# Patient Record
Sex: Male | Born: 1975
Health system: Southern US, Community
[De-identification: ages and names within clinical notes are randomized; demographics above are authoritative.]

## PROBLEM LIST (undated history)

## (undated) DIAGNOSIS — G629 Polyneuropathy, unspecified: Secondary | ICD-10-CM

## (undated) DIAGNOSIS — Z9989 Dependence on other enabling machines and devices: Secondary | ICD-10-CM

## (undated) DIAGNOSIS — M25511 Pain in right shoulder: Secondary | ICD-10-CM

## (undated) DIAGNOSIS — G8929 Other chronic pain: Secondary | ICD-10-CM

## (undated) DIAGNOSIS — M549 Dorsalgia, unspecified: Secondary | ICD-10-CM

## (undated) DIAGNOSIS — C20 Malignant neoplasm of rectum: Secondary | ICD-10-CM

## (undated) DIAGNOSIS — E785 Hyperlipidemia, unspecified: Secondary | ICD-10-CM

## (undated) DIAGNOSIS — G4733 Obstructive sleep apnea (adult) (pediatric): Secondary | ICD-10-CM

## (undated) HISTORY — PX: WISDOM TOOTH EXTRACTION: SHX21

## (undated) HISTORY — DX: Pain in right shoulder: M25.511

## (undated) HISTORY — DX: Hyperlipidemia, unspecified: E78.5

## (undated) HISTORY — DX: Other chronic pain: G89.29

## (undated) HISTORY — DX: Dorsalgia, unspecified: M54.9

---

## 2005-06-08 ENCOUNTER — Ambulatory Visit: Payer: Self-pay | Admitting: Family Medicine

## 2006-01-23 ENCOUNTER — Ambulatory Visit: Payer: Self-pay | Admitting: Family Medicine

## 2006-01-26 ENCOUNTER — Ambulatory Visit: Payer: Self-pay | Admitting: Family Medicine

## 2006-04-20 ENCOUNTER — Ambulatory Visit: Payer: Self-pay | Admitting: Internal Medicine

## 2006-04-25 ENCOUNTER — Ambulatory Visit (HOSPITAL_BASED_OUTPATIENT_CLINIC_OR_DEPARTMENT_OTHER): Admission: RE | Admit: 2006-04-25 | Discharge: 2006-04-25 | Payer: Self-pay | Admitting: Internal Medicine

## 2006-04-29 ENCOUNTER — Ambulatory Visit: Payer: Self-pay | Admitting: Internal Medicine

## 2006-05-17 ENCOUNTER — Ambulatory Visit: Payer: Self-pay | Admitting: Internal Medicine

## 2006-06-21 ENCOUNTER — Emergency Department (HOSPITAL_COMMUNITY): Admission: EM | Admit: 2006-06-21 | Discharge: 2006-06-21 | Payer: Self-pay | Admitting: Emergency Medicine

## 2006-06-23 ENCOUNTER — Emergency Department (HOSPITAL_COMMUNITY): Admission: EM | Admit: 2006-06-23 | Discharge: 2006-06-23 | Payer: Self-pay | Admitting: Emergency Medicine

## 2006-06-27 ENCOUNTER — Emergency Department (HOSPITAL_COMMUNITY): Admission: EM | Admit: 2006-06-27 | Discharge: 2006-06-27 | Payer: Self-pay | Admitting: Emergency Medicine

## 2007-03-18 DIAGNOSIS — J309 Allergic rhinitis, unspecified: Secondary | ICD-10-CM | POA: Insufficient documentation

## 2007-03-18 DIAGNOSIS — F3289 Other specified depressive episodes: Secondary | ICD-10-CM | POA: Insufficient documentation

## 2007-03-18 DIAGNOSIS — F329 Major depressive disorder, single episode, unspecified: Secondary | ICD-10-CM | POA: Insufficient documentation

## 2007-03-18 DIAGNOSIS — F411 Generalized anxiety disorder: Secondary | ICD-10-CM | POA: Insufficient documentation

## 2014-06-18 ENCOUNTER — Encounter: Payer: Self-pay | Admitting: *Deleted

## 2017-08-13 DIAGNOSIS — R0683 Snoring: Secondary | ICD-10-CM | POA: Diagnosis not present

## 2017-08-13 DIAGNOSIS — I10 Essential (primary) hypertension: Secondary | ICD-10-CM | POA: Diagnosis not present

## 2017-08-21 ENCOUNTER — Ambulatory Visit (INDEPENDENT_AMBULATORY_CARE_PROVIDER_SITE_OTHER): Payer: Commercial Managed Care - PPO | Admitting: Neurology

## 2017-08-21 ENCOUNTER — Encounter (INDEPENDENT_AMBULATORY_CARE_PROVIDER_SITE_OTHER): Payer: Self-pay

## 2017-08-21 ENCOUNTER — Encounter: Payer: Self-pay | Admitting: Neurology

## 2017-08-21 VITALS — BP 152/99 | HR 87 | Ht 72.0 in | Wt 181.0 lb

## 2017-08-21 DIAGNOSIS — R519 Headache, unspecified: Secondary | ICD-10-CM

## 2017-08-21 DIAGNOSIS — Z820 Family history of epilepsy and other diseases of the nervous system: Secondary | ICD-10-CM | POA: Diagnosis not present

## 2017-08-21 DIAGNOSIS — R030 Elevated blood-pressure reading, without diagnosis of hypertension: Secondary | ICD-10-CM | POA: Diagnosis not present

## 2017-08-21 DIAGNOSIS — G2581 Restless legs syndrome: Secondary | ICD-10-CM | POA: Diagnosis not present

## 2017-08-21 DIAGNOSIS — G4719 Other hypersomnia: Secondary | ICD-10-CM

## 2017-08-21 DIAGNOSIS — R0681 Apnea, not elsewhere classified: Secondary | ICD-10-CM | POA: Diagnosis not present

## 2017-08-21 DIAGNOSIS — R51 Headache: Secondary | ICD-10-CM | POA: Diagnosis not present

## 2017-08-21 DIAGNOSIS — R0683 Snoring: Secondary | ICD-10-CM | POA: Diagnosis not present

## 2017-08-21 DIAGNOSIS — G4761 Periodic limb movement disorder: Secondary | ICD-10-CM

## 2017-08-21 DIAGNOSIS — E663 Overweight: Secondary | ICD-10-CM | POA: Diagnosis not present

## 2017-08-21 NOTE — Patient Instructions (Signed)

## 2017-08-21 NOTE — Progress Notes (Signed)
Subjective:    Patient ID: Brandon Barry is a 42 y.o. male.  HPI     Star Age, MD, PhD [facility] [address], [address] [place], [place] [address]  Dear Dr. Vanetta Shawl,   I saw your patient, Brandon Barry, upon your kind request in my neurologic clinic today for initial consultation of his sleep disorder, in particular, concern for underlying obstructive sleep apnea. The patient is unaccompanied today. As you know, Mr. Rosete is a 42 year old right-handed gentleman with an underlying medical history of HTN, and borderline overweight state, who reports snoring and excessive daytime somnolence. I reviewed your office note from 08/13/2013, which you kindly included. His Epworth sleepiness score is 12 out of 24 today, fatigue score is 40 out of 63. He lives at home with his wife and 2 children. He quit smoking, drinks alcohol occasionally, maybe 2-3 times a week and coffee in the morning. He has had elevated blood pressure values for some time, he has been tracking his blood pressure at home and has had higher numbers he admits. He has not been on hypertension medication. He reports a family history of OSA in his father who uses a CPAP machine. Patient has been snoring for some time but it has become worse with time. His wife has noted apneic pauses in his breathing while he is asleep. He does not wake up rested. He has a history of restless leg syndrome and has had leg movements in the past and was treated symptomatically with Klonopin as he recalls. He tapered it off some time ago. He remembers having a sleep study some time ago, maybe 10 years ago at which time his sleep apnea was borderline as he recalls. He works as a Psychologist, clinical. He has 2 children ages 45 and 42. Bedtime is fairly structured around 10 or 10:30 and rise time is 5:30. He does not have night to night nocturia but has woken up with a headache on the rare occasion. He quit smoking over 10 years ago  and limits his caffeine to one cup of coffee per morning on average, does not typically drink sodas.  His Past Medical History Is Significant For: Past Medical History:  Diagnosis Date  . Chronic back pain   . Hyperlipidemia   . Right shoulder pain     His Past Surgical History Is Significant For: Past Surgical History:  Procedure Laterality Date  . WISDOM TOOTH EXTRACTION      His Family History Is Significant For: Family History  Problem Relation Age of Onset  . Heart attack Father   . Cancer Paternal Grandmother   . Cancer Paternal Grandfather     His Social History Is Significant For: Social History   Socioeconomic History  . Marital status: Single    Spouse name: None  . Number of children: None  . Years of education: None  . Highest education level: None  Social Needs  . Financial resource strain: None  . Food insecurity - worry: None  . Food insecurity - inability: None  . Transportation needs - medical: None  . Transportation needs - non-medical: None  Occupational History  . None  Tobacco Use  . Smoking status: Former Research scientist (life sciences)  . Smokeless tobacco: Never Used  Substance and Sexual Activity  . Alcohol use: Yes  . Drug use: No  . Sexual activity: None  Other Topics Concern  . None  Social History Narrative  . None  His Allergies Are:  No Known Allergies:   His Current Medications Are:  No outpatient encounter medications on file as of 08/21/2017.   No facility-administered encounter medications on file as of 08/21/2017.   :  Review of Systems:  Out of a complete 14 point review of systems, all are reviewed and negative with the exception of these symptoms as listed below: Review of Systems  Neurological:       Pt presents today to discuss his sleep. Pt has had a sleep study a long time ago but has never been treated for osa. Pt does endorse snoring.  Epworth Sleepiness Scale 0= would never doze 1= slight chance of dozing 2= moderate chance  of dozing 3= high chance of dozing  Sitting and reading: 1 Watching TV: 3 Sitting inactive in a public place (ex. Theater or meeting): 2 As a passenger in a car for an hour without a break: 1 Lying down to rest in the afternoon: 3 Sitting and talking to someone: 0 Sitting quietly after lunch (no alcohol): 2 In a car, while stopped in traffic: 0 Total: 12     Objective:  Neurological Exam  Physical Exam Physical Examination:   Vitals:   08/21/17 1341  BP: (!) 152/99  Pulse: 87    General Examination: The patient is a very pleasant 42 y.o. male in no acute distress. He appears well-developed and well-nourished and adequately groomed.   HEENT: Normocephalic, atraumatic, pupils are equal, round and reactive to light and accommodation. Extraocular tracking is good without limitation to gaze excursion or nystagmus noted. Normal smooth pursuit is noted. Hearing is grossly intact. Face is symmetric with normal facial animation and normal facial sensation. Speech is clear with no dysarthria noted. There is no hypophonia. There is no lip, neck/head, jaw or voice tremor. Neck is supple with full range of passive and active motion. There are no carotid bruits on auscultation. Oropharynx exam reveals: mild mouth dryness, adequate dental hygiene and moderate airway crowding, due to redundant soft palate and fairly low reaching uvula. Mallampati is class Barry. Tonsils are either absent or small. Patient reported no history of tonsillectomy. He has an absent overbite. Tongue protrudes centrally and palate elevates symmetrically.   Chest: Clear to auscultation without wheezing, rhonchi or crackles noted.  Heart: S1+S2+0, regular and normal without murmurs, rubs or gallops noted.   Abdomen: Soft, non-tender and non-distended with normal bowel sounds appreciated on auscultation.  Extremities: There is no pitting edema in the distal lower extremities bilaterally. Pedal pulses are intact.  Skin:  Warm and dry without trophic changes noted.   Musculoskeletal: exam reveals no obvious joint deformities, tenderness or joint swelling or erythema.   Neurologically:  Mental status: The patient is awake, alert and oriented in all 4 spheres. His immediate and remote memory, attention, language skills and fund of knowledge are appropriate. There is no evidence of aphasia, agnosia, apraxia or anomia. Speech is clear with normal prosody and enunciation. Thought process is linear. Mood is normal and affect is normal.  Cranial nerves II - XII are as described above under HEENT exam. In addition: shoulder shrug is normal with equal shoulder height noted. Motor exam: Normal bulk, strength and tone is noted. There is no drift, tremor or rebound. Romberg is negative. Fine motor skills and coordination: intact with normal finger taps, normal hand movements, normal rapid alternating patting, normal foot taps and normal foot agility.  Cerebellar testing: No dysmetria or intention tremor on finger to nose testing.  Heel to shin is unremarkable bilaterally. There is no truncal or gait ataxia.  Sensory exam: intact to light touch in the upper and lower extremities.  Gait, station and balance: He stands easily. No veering to one side is noted. No leaning to one side is noted. Posture is age-appropriate and stance is narrow based. Gait shows normal stride length and normal pace. No problems turning are noted. Tandem walk is unremarkable.   Assessment and Plan:  In summary, Nydia Bouton Barry is a very pleasant 42 y.o.-year old male with a history of elevated blood pressure values and prior history of restless leg syndrome, whose history and physical exam are concerning for underlying obstructive sleep apnea (OSA). I had a long chat with the patient about my findings and the diagnosis of OSA, its prognosis and treatment options. We talked about medical treatments, surgical interventions and non-pharmacological approaches. I  explained in particular the risks and ramifications of untreated moderate to severe OSA, especially with respect to developing cardiovascular disease down the Road, including congestive heart failure, difficult to treat hypertension, cardiac arrhythmias, or stroke. Even type 2 diabetes has, in part, been linked to untreated OSA. Symptoms of untreated OSA include daytime sleepiness, memory problems, mood irritability and mood disorder such as depression and anxiety, lack of energy, as well as recurrent headaches, especially morning headaches. We talked about trying to maintain a healthy lifestyle in general, as well as the importance of weight control. I encouraged the patient to eat healthy, exercise daily and keep well hydrated, to keep a scheduled bedtime and wake time routine, to not skip any meals and eat healthy snacks in between meals. I advised the patient not to drive when feeling sleepy. I recommended the following at this time: sleep study  with potential positive airway pressure titration. (We will score hypopneas at 4%).   I explained the sleep test procedure to the patient and also outlined possible surgical and non-surgical treatment options of OSA, including the use of a custom-made dental device (which would require a referral to a specialist dentist or oral surgeon), upper airway surgical options, such as pillar implants, radiofrequency surgery, tongue base surgery, and UPPP (which would involve a referral to an ENT surgeon). Rarely, jaw surgery such as mandibular advancement may be considered.  I also explained the CPAP treatment option to the patient, who indicated that he would be willing to try CPAP if the need arises. I explained the importance of being compliant with PAP treatment, not only for insurance purposes but primarily to improve His symptoms, and for the patient's long term health benefit, including to reduce His cardiovascular risks. I answered all his questions today and the  patient was in agreement. I would like to see him back after the sleep study is completed and encouraged him to call with any interim questions, concerns, problems or updates.   Thank you very much for allowing me to participate in the care of this nice patient. If I can be of any further assistance to you please do not hesitate to call me at [phone].  Sincerely,   Star Age, MD, PhD

## 2017-10-08 ENCOUNTER — Ambulatory Visit (INDEPENDENT_AMBULATORY_CARE_PROVIDER_SITE_OTHER): Payer: Commercial Managed Care - PPO | Admitting: Neurology

## 2017-10-08 DIAGNOSIS — R519 Headache, unspecified: Secondary | ICD-10-CM

## 2017-10-08 DIAGNOSIS — G4733 Obstructive sleep apnea (adult) (pediatric): Secondary | ICD-10-CM | POA: Diagnosis not present

## 2017-10-08 DIAGNOSIS — R51 Headache: Secondary | ICD-10-CM

## 2017-10-08 DIAGNOSIS — R030 Elevated blood-pressure reading, without diagnosis of hypertension: Secondary | ICD-10-CM

## 2017-10-08 DIAGNOSIS — Z820 Family history of epilepsy and other diseases of the nervous system: Secondary | ICD-10-CM

## 2017-10-08 DIAGNOSIS — R0683 Snoring: Secondary | ICD-10-CM

## 2017-10-08 DIAGNOSIS — G4720 Circadian rhythm sleep disorder, unspecified type: Secondary | ICD-10-CM

## 2017-10-08 DIAGNOSIS — G4719 Other hypersomnia: Secondary | ICD-10-CM

## 2017-10-08 DIAGNOSIS — G2581 Restless legs syndrome: Secondary | ICD-10-CM

## 2017-10-08 DIAGNOSIS — R0681 Apnea, not elsewhere classified: Secondary | ICD-10-CM

## 2017-10-08 DIAGNOSIS — E663 Overweight: Secondary | ICD-10-CM

## 2017-10-08 DIAGNOSIS — G4761 Periodic limb movement disorder: Secondary | ICD-10-CM

## 2017-10-10 ENCOUNTER — Telehealth: Payer: Self-pay

## 2017-10-10 NOTE — Progress Notes (Signed)
Patient referred by Dr. Vanetta Shawl, seen by me on 08/21/17, diagnostic PSG on 10/08/17.   Please call and notify the patient that the recent sleep study showed obstructive sleep apnea, overall mild but severe during supine sleep with a total AHI of 11.9/hour, REM AHI of 5.9/hour, supine AHI of 35.6/hour, and O2 nadir of 85%. . I recommend treatment for this in the form of CPAP. This will require a repeat sleep study for proper titration and mask fitting and correct monitoring of the oxygen saturations. Please explain to patient. I have placed an order in the chart. Thanks.  Star Age, MD, PhD [facility])

## 2017-10-10 NOTE — Telephone Encounter (Signed)
I called Brandon Barry. I advised Brandon Barry that Dr. Rexene Alberts reviewed their sleep study results and found that Brandon Barry has overall mild osa but it was severe during his supine sleep and recommends that Brandon Barry be treated with a cpap. Dr. Rexene Alberts recommends that Brandon Barry return for a repeat sleep study in order to properly titrate the cpap and ensure a good mask fit. Brandon Barry is agreeable to returning for a titration study. I advised Brandon Barry that our sleep lab will file with Brandon Barry's insurance and call Brandon Barry to schedule the sleep study when we hear back from the Brandon Barry's insurance regarding coverage of this sleep study. Brandon Barry verbalized understanding of results. Brandon Barry had no questions at this time but was encouraged to call back if questions arise.

## 2017-10-10 NOTE — Addendum Note (Signed)
Addended by: Star Age on: 10/10/2017 07:55 AM   Modules accepted: Orders

## 2017-10-10 NOTE — Procedures (Signed)
PATIENT'S NAME:  Brandon Barry, Mohr DOB:      02-12-1976      MR#:    [id number]     DATE OF RECORDING: 10/08/2017 REFERRING M.D.:  Katherine Basset, DO Study Performed:   Baseline Polysomnogram HISTORY: 42 year old man with a history of HTN, and borderline overweight state, who reports snoring and excessive daytime somnolence. His Epworth sleepiness score is 12 out of 24. The patient's weight 181 pounds with a height of 62 (inches), resulting in a BMI of 33.3 kg/m2. The patient's neck circumference measured 16.5 inches.  CURRENT MEDICATIONS: None   PROCEDURE:  This is a multichannel digital polysomnogram utilizing the Somnostar 11.2 system.  Electrodes and sensors were applied and monitored per AASM Specifications.   EEG, EOG, Chin and Limb EMG, were sampled at 200 Hz.  ECG, Snore and Nasal Pressure, Thermal Airflow, Respiratory Effort, CPAP Flow and Pressure, Oximetry was sampled at 50 Hz. Digital video and audio were recorded.      BASELINE STUDY  Lights Out was at 21:04 and Lights On at 05:01.  Total recording time (TRT) was 478 minutes, with a total sleep time (TST) of  322 minutes.   The patient's sleep latency was 108 minutes, which is markedly delayed.  REM latency was 62.5 minutes, which is mildly reduced. The sleep efficiency was 67.4%, which is reduced.     SLEEP ARCHITECTURE: WASO (Wake after sleep onset) was 51 minutes with overall mild sleep fragmentation noted and 2 longer periods of wakefulness. There were 16.5 minutes in Stage N1, 245.5 minutes Stage N2, 9.5 minutes Stage N3 and 50.5 minutes in Stage REM.  The percentage of Stage N1 was 5.1%, Stage N2 was 76.2%, which is increased, Stage N3 was 3% and Stage R (REM sleep) was 15.7%, which is mildly reduced. The arousals were noted as: 21 were spontaneous, 11 were associated with PLMs, 64 were associated with respiratory events.  RESPIRATORY ANALYSIS:  There were a total of 64 respiratory events:  5 obstructive apneas, 0 central apneas and 0  mixed apneas with a total of 5 apneas and an apnea index (AI) of .9 /hour. There were 59 hypopneas with a hypopnea index of 11. /hour. The patient also had 0 respiratory event related arousals (RERAs).      The total APNEA/HYPOPNEA INDEX (AHI) was 11.9/hour and the total RESPIRATORY DISTURBANCE INDEX was 0. 11.9 /hour.  5 events occurred in REM sleep and 108 events in NREM. The REM AHI was 5.9/hour, versus a non-REM AHI of 13.. The patient spent 89.5 minutes of total sleep time in the supine position and 233 minutes in non-supine. The supine AHI was 35.6 versus a non-supine AHI of 2.8.  OXYGEN SATURATION & C02:  The Wake baseline 02 saturation was 94%, with the lowest being 85%. Time spent below 89% saturation equaled 3 minutes.  PERIODIC LIMB MOVEMENTS: The patient had a total of 39 Periodic Limb Movements.  The Periodic Limb Movement (PLM) index was 7.3 and the PLM Arousal index was 2./hour.   Audio and video analysis did not show any abnormal or unusual movements, behaviors, phonations or vocalizations. The patient took 2 bathroom breaks. Moderate snoring was noted. The EKG was in keeping with normal sinus rhythm (NSR).   Post-study, the patient indicated that sleep was worse than usual.   IMPRESSION:  1. Obstructive Sleep Apnea (OSA) 2. Dysfunctions associated with Sleep stages or Arousal from Sleep   RECOMMENDATIONS:  1. This study demonstrates overall mild obstructive sleep apnea, severe  during supine sleep with a total AHI of 11.9/hour, REM AHI of 5.9/hour, supine AHI of 35.6/hour, and O2 nadir of 85%. Given the patient's medical history and sleep related complaints, a full-night CPAP titration study is recommended to optimize therapy. Other treatment options may include avoidance of supine sleep position along with weight loss, upper airway or jaw surgery in selected patients or the use of an oral appliance in certain patients. ENT evaluation and/or consultation with a maxillofacial  surgeon or dentist may be feasible in some instances.    2. Please note that untreated obstructive sleep apnea may carry additional perioperative morbidity. Patients with significant obstructive sleep apnea should receive perioperative PAP therapy and the surgeons and particularly the anesthesiologist should be informed of the diagnosis and the severity of the sleep disordered breathing. 3. The patient should be cautioned not to drive, work at heights, or operate dangerous or heavy equipment when tired or sleepy. Review and reiteration of good sleep hygiene measures should be pursued with any patient. 4. The patient will be seen in follow-up by Dr. Rexene Alberts at [facility] for discussion of the test results and further management strategies. The referring provider will be notified of the test results.  I certify that I have reviewed the entire raw data recording prior to the issuance of this report in accordance with the Standards of Accreditation of the American Academy of Sleep Medicine (AASM)   Star Age, MD, PhD Diplomat, American Board of Psychiatry and Neurology (Neurology and Sleep Medicine)

## 2017-10-10 NOTE — Telephone Encounter (Signed)
-----   Message from Star Age, MD sent at 10/10/2017  7:55 AM EDT ----- Patient referred by Dr. Vanetta Shawl, seen by me on 08/21/17, diagnostic PSG on 10/08/17.   Please call and notify the patient that the recent sleep study showed obstructive sleep apnea, overall mild but severe during supine sleep with a total AHI of 11.9/hour, REM AHI of 5.9/hour, supine AHI of 35.6/hour, and O2 nadir of 85%. . I recommend treatment for this in the form of CPAP. This will require a repeat sleep study for proper titration and mask fitting and correct monitoring of the oxygen saturations. Please explain to patient. I have placed an order in the chart. Thanks.  Star Age, MD, PhD [facility])

## 2017-10-18 ENCOUNTER — Ambulatory Visit (INDEPENDENT_AMBULATORY_CARE_PROVIDER_SITE_OTHER): Payer: Commercial Managed Care - PPO | Admitting: Neurology

## 2017-10-18 DIAGNOSIS — G4733 Obstructive sleep apnea (adult) (pediatric): Secondary | ICD-10-CM

## 2017-10-18 DIAGNOSIS — R51 Headache: Secondary | ICD-10-CM

## 2017-10-18 DIAGNOSIS — G4761 Periodic limb movement disorder: Secondary | ICD-10-CM

## 2017-10-18 DIAGNOSIS — R519 Headache, unspecified: Secondary | ICD-10-CM

## 2017-10-18 DIAGNOSIS — R030 Elevated blood-pressure reading, without diagnosis of hypertension: Secondary | ICD-10-CM

## 2017-10-18 DIAGNOSIS — G4719 Other hypersomnia: Secondary | ICD-10-CM

## 2017-10-18 DIAGNOSIS — G2581 Restless legs syndrome: Secondary | ICD-10-CM

## 2017-10-18 DIAGNOSIS — G4720 Circadian rhythm sleep disorder, unspecified type: Secondary | ICD-10-CM

## 2017-10-22 ENCOUNTER — Telehealth: Payer: Self-pay

## 2017-10-22 NOTE — Addendum Note (Signed)
Addended by: Star Age on: 10/22/2017 07:44 AM   Modules accepted: Orders

## 2017-10-22 NOTE — Procedures (Signed)
PATIENT'S NAME:  Brandon Barry, Brandon Barry DOB:      [birth year]      MR#:    [id number]     DATE OF RECORDING: 10/18/2017 REFERRING M.D.:  Katherine Basset, DO Study Performed:   CPAP  Titration HISTORY:  42 year old man with a history of HTN, and borderline overweight state, who presents for a CPAP titration study. His diagnostic PSG on 10/08/17 showed a total AHI of 11.9/hour, REM AHI of 5.9/hour, supine AHI of 35.6/hour, and O2 nadir of 85%. The patient endorsed the Epworth Sleepiness Scale at 12 points. The patient's weight 181 pounds with a height of 72 (inches), resulting in a BMI of 24.5 kg/m2.  CURRENT MEDICATIONS: None   PROCEDURE:  This is a multichannel digital polysomnogram utilizing the SomnoStar 11.2 system.  Electrodes and sensors were applied and monitored per AASM Specifications.   EEG, EOG, Chin and Limb EMG, were sampled at 200 Hz.  ECG, Snore and Nasal Pressure, Thermal Airflow, Respiratory Effort, CPAP Flow and Pressure, Oximetry was sampled at 50 Hz. Digital video and audio were recorded.      The patient was fitted with medium P10 nasal pillows. CPAP was initiated at 5 cmH20 with heated humidity per AASM standards and pressure was advanced to 9 cmH20 because of hypopneas, apneas and desaturations.  At a PAP pressure of 8 cmH20, there was a reduction of the AHI to 0.5/hour with supine REM sleep achieved and O2 nadir of 93%.   Lights Out was at 22:32 and Lights On at 05:01. Total recording time (TRT) was 389 minutes, with a total sleep time (TST) of 339 minutes. The patient's sleep latency was 24 minutes. REM latency was 60.5 minutes, which is mildly reduced. The sleep efficiency was 87.1 %.    SLEEP ARCHITECTURE: WASO (Wake after sleep onset) was 25.5 minutes.  There were 11 minutes in Stage N1, 214.5 minutes Stage N2, 40 minutes Stage N3 and 73.5 minutes in Stage REM.  The percentage of Stage N1 was 3.2%, Stage N2 was 63.3%, which is mildly increased, Stage N3 was 11.8% and Stage R (REM sleep)  was 21.7%, which is normal. The arousals were noted as: 30 were spontaneous, 4 were associated with PLMs, 1 were associated with respiratory events.  RESPIRATORY ANALYSIS:  There was a total of 4 respiratory events: 1 obstructive apneas, 0 central apneas and 0 mixed apneas with a total of 1 apneas and an apnea index (AI) of .2 /hour. There were 3 hypopneas with a hypopnea index of .5/hour. The patient also had 0 respiratory event related arousals (RERAs).      The total APNEA/HYPOPNEA INDEX  (AHI) was .7/hour and the total RESPIRATORY DISTURBANCE INDEX was .7 0./hour  0 events occurred in REM sleep and 4 events in NREM. The REM AHI was 0/hour versus a non-REM AHI of .9 0./hour.  The patient spent 339 minutes of total sleep time in the supine position and 0 minutes in non-supine. The supine AHI was 0.7, versus a non-supine AHI of 0.0.  OXYGEN SATURATION & C02:  The baseline 02 saturation was 94%, with the lowest being 89%. Time spent below 89% saturation equaled 0 minutes.  PERIODIC LIMB MOVEMENTS:  The patient had a total of 105 Periodic Limb Movements. The Periodic Limb Movement (PLM) index was 18.6 and the PLM Arousal index was .7/hour.  Audio and video analysis did not show any abnormal or unusual movements, behaviors, phonations or vocalizations. The patient took no bathroom breaks. The EKG was in  keeping with normal sinus rhythm (NSR).  Post-study, the patient indicated that sleep was better than usual.   IMPRESSION:   1. Obstructive Sleep Apnea (OSA) 2. Periodic limb movements of sleep  RECOMMENDATIONS:   1. This study demonstrates resolution of the patient's obstructive sleep apnea with CPAP therapy. I will, therefore, start the patient on home CPAP treatment at a pressure of 8 cm via medium nasal pillows with heated humidity. The patient should be reminded to be fully compliant with PAP therapy to improve sleep related symptoms and decrease long term cardiovascular risks. The patient  should be reminded, that it may take up to 3 months to get fully used to using PAP with all planned sleep. The earlier full compliance is achieved, the better long term compliance tends to be. Please note that untreated obstructive sleep apnea may carry additional perioperative morbidity. Patients with significant obstructive sleep apnea should receive perioperative PAP therapy and the surgeons and particularly the anesthesiologist should be informed of the diagnosis and the severity of the sleep disordered breathing. 2. Mild PLMs (periodic limb movements of sleep) were noted during this study with no significant arousals; clinical correlation is recommended.  3. The patient should be cautioned not to drive, work at heights, or operate dangerous or heavy equipment when tired or sleepy. Review and reiteration of good sleep hygiene measures should be pursued with any patient. 4. The patient will be seen in follow-up by Dr. Rexene Alberts at [facility] for discussion of the test results and further management strategies. The referring provider will be notified of the test results.   I certify that I have reviewed the entire raw data recording prior to the issuance of this report in accordance with the Standards of Accreditation of the American Academy of Sleep Medicine (AASM)     Star Age, MD, PhD Diplomat, American Board of Psychiatry and Neurology (Neurology and Sleep Medicine)

## 2017-10-22 NOTE — Progress Notes (Signed)
Patient referred by Dr. Vanetta Shawl, seen by me on 08/21/17, diagnostic PSG on 10/08/17. Patient had a CPAP titration study on 10/18/17.  Please call and inform patient that I have entered an order for treatment with positive airway pressure (PAP) treatment for obstructive sleep apnea (OSA). He did well during the latest sleep study with CPAP. We will, therefore, arrange for a machine for home use through a DME (durable medical equipment) company of His choice; and I will see the patient back in follow-up in about 10 weeks. Please also explain to the patient that I will be looking out for compliance data, which can be downloaded from the machine (stored on an SD card, that is inserted in the machine) or via remote access through a modem, that is built into the machine. At the time of the followup appointment we will discuss sleep study results and how it is going with PAP treatment at home. Please advise patient to bring His machine at the time of the first FU visit, even though this is cumbersome. Bringing the machine for every visit after that will likely not be needed, but often helps for the first visit to troubleshoot if needed. Please re-enforce the importance of compliance with treatment and the need for [place] to monitor compliance data - often an insurance requirement and actually good feedback for the patient as far as how they are doing.  Also remind patient, that any interim PAP machine or mask issues should be first addressed with the DME company, as they can often help better with technical and mask fit issues. Please ask if patient has a preference regarding DME company.  Please also make sure, the patient has a follow-up appointment with me in about 10 weeks from the setup date, thanks. May see one of our nurse practitioners if needed for proper timing of the FU appointment.  Please fax or rout report to the referring provider. Thanks,   Star Age, MD, PhD [facility])

## 2017-10-22 NOTE — Telephone Encounter (Signed)
I called pt. I advised pt that Dr. Rexene Alberts reviewed their sleep study results and found that pt did well with the cpap during his latest sleep study. Dr. Rexene Alberts recommends that pt start a cpap at home. Pt reports that he has a cpap available to him that is around 43 year old. I reviewed PAP compliance expectations with the pt. Pt is agreeable to starting a CPAP. I advised pt that an order will be sent to a DME, [facility], and [facility] will call the pt within about one week after they file with the pt's insurance. [facility] will show the pt how to use the machine, fit for masks, and troubleshoot the CPAP if needed. A follow up appt was made for insurance purposes with Dr. Rexene Alberts on 12/27/17 at 2:00pm. Pt verbalized understanding to arrive 15 minutes early and bring their CPAP. A letter with all of this information in it will be mailed to the pt as a reminder. I verified with the pt that the address we have on file is correct. Pt verbalized understanding of results. Pt had no questions at this time but was encouraged to call back if questions arise.

## 2017-10-22 NOTE — Telephone Encounter (Signed)
-----   Message from Star Age, MD sent at 10/22/2017  7:44 AM EDT ----- Patient referred by Dr. Vanetta Shawl, seen by me on 08/21/17, diagnostic PSG on 10/08/17. Patient had a CPAP titration study on 10/18/17.  Please call and inform patient that I have entered an order for treatment with positive airway pressure (PAP) treatment for obstructive sleep apnea (OSA). He did well during the latest sleep study with CPAP. We will, therefore, arrange for a machine for home use through a DME (durable medical equipment) company of His choice; and I will see the patient back in follow-up in about 10 weeks. Please also explain to the patient that I will be looking out for compliance data, which can be downloaded from the machine (stored on an SD card, that is inserted in the machine) or via remote access through a modem, that is built into the machine. At the time of the followup appointment we will discuss sleep study results and how it is going with PAP treatment at home. Please advise patient to bring His machine at the time of the first FU visit, even though this is cumbersome. Bringing the machine for every visit after that will likely not be needed, but often helps for the first visit to troubleshoot if needed. Please re-enforce the importance of compliance with treatment and the need for [place] to monitor compliance data - often an insurance requirement and actually good feedback for the patient as far as how they are doing.  Also remind patient, that any interim PAP machine or mask issues should be first addressed with the DME company, as they can often help better with technical and mask fit issues. Please ask if patient has a preference regarding DME company.  Please also make sure, the patient has a follow-up appointment with me in about 10 weeks from the setup date, thanks. May see one of our nurse practitioners if needed for proper timing of the FU appointment.  Please fax or rout report to the referring provider. Thanks,    Star Age, MD, PhD [facility])

## 2017-11-09 DIAGNOSIS — G4733 Obstructive sleep apnea (adult) (pediatric): Secondary | ICD-10-CM | POA: Diagnosis not present

## 2017-11-13 DIAGNOSIS — I10 Essential (primary) hypertension: Secondary | ICD-10-CM | POA: Diagnosis not present

## 2017-12-09 DIAGNOSIS — G4733 Obstructive sleep apnea (adult) (pediatric): Secondary | ICD-10-CM | POA: Diagnosis not present

## 2017-12-27 ENCOUNTER — Ambulatory Visit: Payer: Self-pay | Admitting: Neurology

## 2018-01-09 DIAGNOSIS — G4733 Obstructive sleep apnea (adult) (pediatric): Secondary | ICD-10-CM | POA: Diagnosis not present

## 2018-01-29 ENCOUNTER — Encounter: Payer: Self-pay | Admitting: Neurology

## 2018-01-31 ENCOUNTER — Encounter: Payer: Self-pay | Admitting: Neurology

## 2018-01-31 ENCOUNTER — Ambulatory Visit (INDEPENDENT_AMBULATORY_CARE_PROVIDER_SITE_OTHER): Payer: Commercial Managed Care - PPO | Admitting: Neurology

## 2018-01-31 VITALS — BP 142/94 | HR 73 | Ht 72.0 in | Wt 184.0 lb

## 2018-01-31 DIAGNOSIS — Z9989 Dependence on other enabling machines and devices: Secondary | ICD-10-CM

## 2018-01-31 DIAGNOSIS — G4733 Obstructive sleep apnea (adult) (pediatric): Secondary | ICD-10-CM | POA: Diagnosis not present

## 2018-01-31 NOTE — Patient Instructions (Signed)

## 2018-01-31 NOTE — Progress Notes (Signed)
Subjective:    Patient ID: Brandon Barry is a 42 y.o. male.  HPI     Interim history:   [name] is a 42 year old right-handed gentleman with an underlying medical history of HTN, and borderline overweight state, who presents for follow-up consultation of his obstructive sleep apnea, after sleep study testing and starting CPAP therapy. The patient is unaccompanied today. I first met him on 08/21/2017 at the request of his primary care physician, at which time he reported snoring and daytime somnolence and a family history of OSA. His wife had noted pauses in his breathing while he was asleep. He had a baseline sleep study, followed by a CPAP titration study. I went over his test results with him in detail today. Baseline sleep study from 10/08/2017 showed a sleep latency delayed at 108 minutes, REM latency was 62.5 minutes, sleep efficiency reduced. He had an increased percentage of light stage sleep, slow-wave sleep was reduced and REM sleep was also reduced. His total AHI was 11.9 per hour, REM AHI was 5.9 per hour, supine AHI was 35.6 per hour. Average oxygen saturation was 94%, nadir was 85%. He did not have any significant PLMS. Based on his sleep related complaints and test results he was advised to proceed with a CPAP titration study. He had this on 10/18/2017. Sleep latency was 24 minutes, REM latency was 60.5 minutes, sleep efficiency 87.1%. He had 11.8% of slow-wave sleep and a normal percentage of REM sleep. He was fitted with nasal pillows and CPAP was titrated from 5 cm to 9 cm. At a pressure of 8 cm his AHI was 0.5 per hour with supine REM sleep achieved an O2 nadir of 93%. He was noted to have mild PLMS during the study with no significant arousals. Based on his test results I suggested he proceed with CPAP therapy at home at a pressure of 8 cm. Today, 01/31/2018: I reviewed his CPAP compliance data from 12/31/2017 through 01/29/2018 which is a total of 30 days during which time he used  his CPAP every night with percent used days greater than 4 hours at 100%, indicating superb compliance with an average usage of 7 hours and 0 minutes, residual AHI at goal at 0.8 per hour, leak quite low with the 95th percentile at 1.8 L/m on a pressure of 8 cm with EPR of 3. He reports that CPAP is going well. He reports improvement in his sleep quality, snoring, sleep consolidation and daytime tiredness. He is still at times restless and per wife's feedback he has had some resurgence of leg twitching at night but has had no flareup in his restless leg symptoms. He is motivated to continue with CPAP therapy and so far quite pleased with his outcome. He is using nasal pillows.  The patient's allergies, current medications, family history, past medical history, past social history, past surgical history and problem list were reviewed and updated as appropriate.   Previously:   08/21/2017: (He) reports snoring and excessive daytime somnolence. I reviewed your office note from 08/13/2013, which you kindly included. His Epworth sleepiness score is 12 out of 24 today, fatigue score is 40 out of 63. He lives at home with his wife and 2 children. He quit smoking, drinks alcohol occasionally, maybe 2-3 times a week and coffee in the morning. He has had elevated blood pressure values for some time, he has been tracking his blood pressure at home and has had higher numbers he admits. He has not been  on hypertension medication. He reports a family history of OSA in his father who uses a CPAP machine. Patient has been snoring for some time but it has become worse with time. His wife has noted apneic pauses in his breathing while he is asleep. He does not wake up rested. He has a history of restless leg syndrome and has had leg movements in the past and was treated symptomatically with Klonopin as he recalls. He tapered it off some time ago. He remembers having a sleep study some time ago, maybe 10 years ago at which time  his sleep apnea was borderline as he recalls. He works as a mechanic on forklift. He has 2 children ages 9 and 8. Bedtime is fairly structured around 10 or 10:30 and rise time is 5:30. He does not have night to night nocturia but has woken up with a headache on the rare occasion. He quit smoking over 10 years ago and limits his caffeine to one cup of coffee per morning on average, does not typically drink sodas.  His Past Medical History Is Significant For: Past Medical History:  Diagnosis Date  . Chronic back pain   . Hyperlipidemia   . Right shoulder pain     His Past Surgical History Is Significant For: Past Surgical History:  Procedure Laterality Date  . WISDOM TOOTH EXTRACTION      His Family History Is Significant For: Family History  Problem Relation Age of Onset  . Heart attack Father   . Cancer Paternal Grandmother   . Cancer Paternal Grandfather     His Social History Is Significant For: Social History   Socioeconomic History  . Marital status: Single    Spouse name: Not on file  . Number of children: Not on file  . Years of education: Not on file  . Highest education level: Not on file  Occupational History  . Not on file  Social Needs  . Financial resource strain: Not on file  . Food insecurity:    Worry: Not on file    Inability: Not on file  . Transportation needs:    Medical: Not on file    Non-medical: Not on file  Tobacco Use  . Smoking status: Former Smoker  . Smokeless tobacco: Never Used  Substance and Sexual Activity  . Alcohol use: Yes  . Drug use: No  . Sexual activity: Not on file  Lifestyle  . Physical activity:    Days per week: Not on file    Minutes per session: Not on file  . Stress: Not on file  Relationships  . Social connections:    Talks on phone: Not on file    Gets together: Not on file    Attends religious service: Not on file    Active member of club or organization: Not on file    Attends meetings of clubs or  organizations: Not on file    Relationship status: Not on file  Other Topics Concern  . Not on file  Social History Narrative  . Not on file    His Allergies Are:  No Known Allergies:   His Current Medications Are:  No outpatient encounter medications on file as of 01/31/2018.   No facility-administered encounter medications on file as of 01/31/2018.   :  Review of Systems:  Out of a complete 14 point review of systems, all are reviewed and negative with the exception of these symptoms as listed below: Review of Systems  Neurological:         Pt presents today to discuss his cpap. Pt reports that he has no complaints with it and feels better.    Objective:  Neurological Exam  Physical Exam Physical Examination:   Vitals:   01/31/18 1428  BP: (!) 142/94  Pulse: 73   General Examination: The patient is a very pleasant 42 y.o. male in no acute distress. He appears well-developed and well-nourished and well groomed.   HEENT: Normocephalic, atraumatic, pupils are equal, round and reactive to light and accommodation. Extraocular tracking is good without limitation to gaze excursion or nystagmus noted. Normal smooth pursuit is noted. Hearing is grossly intact. Face is symmetric with normal facial animation and normal facial sensation. Speech is clear with no dysarthria noted. There is no hypophonia. There is no lip, neck/head, jaw or voice tremor. Neck is supple with full range of passive and active motion. Oropharynx exam reveals: mild mouth dryness, adequate dental hygiene and moderate airway crowding. Tongue protrudes centrally and palate elevates symmetrically.   Chest: Clear to auscultation without wheezing, rhonchi or crackles noted.  Heart: S1+S2+0, regular and normal without murmurs, rubs or gallops noted.   Abdomen: Soft, non-tender and non-distended with normal bowel sounds appreciated on auscultation.  Extremities: There is no pitting edema in the distal lower  extremities bilaterally.   Skin: Warm and dry without trophic changes noted.   Musculoskeletal: exam reveals no obvious joint deformities, tenderness or joint swelling or erythema.   Neurologically:  Mental status: The patient is awake, alert and oriented in all 4 spheres. His immediate and remote memory, attention, language skills and fund of knowledge are appropriate. There is no evidence of aphasia, agnosia, apraxia or anomia. Speech is clear with normal prosody and enunciation. Thought process is linear. Mood is normal and affect is normal.  Cranial nerves II - XII are as described above under HEENT exam.  Motor exam: Normal bulk, strength and tone is noted. There is no drift, tremor or rebound. Romberg is negative. Fine motor skills and coordination: intact with normal finger taps, normal hand movements, normal rapid alternating patting, normal foot taps and normal foot agility.  Cerebellar testing: No dysmetria or intention tremor. There is no truncal or gait ataxia.  Sensory exam: intact to light touch in the upper and lower extremities.  Gait, station and balance: He stands easily. No veering to one side is noted. No leaning to one side is noted. Posture is age-appropriate and stance is narrow based. Gait shows normal stride length and normal pace. No problems turning are noted. Tandem walk is unremarkable.   Assessment and Plan:  In summary, Bravery C Lowe Barry is a very pleasant 42-year old male with a history of elevated blood pressure values and prior history of restless leg syndrome, who Presents for follow-up consultation of his obstructive sleep apnea, after sleep study testing and starting CPAP therapy at home. His baseline sleep study from April 2019 showed evidence of overall mild obstructive sleep apnea, much more pronounced during supine sleep. He had a CPAP titration study in May 2019 and did quite well with CPAP of 8 cm. He did have mild PLMS during this study without  significant sleep disruption. He does report some recurrence of sleep related leg movements but no flareup of his restless leg symptoms. He is fully compliant with CPAP therapy and highly commended for this. He feels improved in that his sleep consolidation and sleep quality her better and he is not as tired during the day. In the past for restless   leg syndrome he was treated with Klonopin, as he recalls.  We can continue to monitor him for restless leg symptoms. I suggested we follow up routinely in 6 months, sooner if needed. I answered all his questions today and he was in agreement.  I spent 25 minutes in total face-to-face time with the patient, more than 50% of which was spent in counseling and coordination of care, reviewing test results, reviewing medication and discussing or reviewing the diagnosis of OSA, its prognosis and treatment options. Pertinent laboratory and imaging test results that were available during this visit with the patient were reviewed by me and considered in my medical decision making (see chart for details).   

## 2018-02-09 DIAGNOSIS — G4733 Obstructive sleep apnea (adult) (pediatric): Secondary | ICD-10-CM | POA: Diagnosis not present

## 2018-02-25 DIAGNOSIS — Z0001 Encounter for general adult medical examination with abnormal findings: Secondary | ICD-10-CM | POA: Diagnosis not present

## 2018-02-25 DIAGNOSIS — I10 Essential (primary) hypertension: Secondary | ICD-10-CM | POA: Diagnosis not present

## 2018-02-25 DIAGNOSIS — Z136 Encounter for screening for cardiovascular disorders: Secondary | ICD-10-CM | POA: Diagnosis not present

## 2018-02-28 DIAGNOSIS — Z136 Encounter for screening for cardiovascular disorders: Secondary | ICD-10-CM | POA: Diagnosis not present

## 2018-02-28 DIAGNOSIS — I10 Essential (primary) hypertension: Secondary | ICD-10-CM | POA: Diagnosis not present

## 2018-02-28 DIAGNOSIS — Z125 Encounter for screening for malignant neoplasm of prostate: Secondary | ICD-10-CM | POA: Diagnosis not present

## 2018-05-21 DIAGNOSIS — M25562 Pain in left knee: Secondary | ICD-10-CM | POA: Diagnosis not present

## 2018-05-21 DIAGNOSIS — I10 Essential (primary) hypertension: Secondary | ICD-10-CM | POA: Diagnosis not present

## 2018-05-21 DIAGNOSIS — Z6824 Body mass index (BMI) 24.0-24.9, adult: Secondary | ICD-10-CM | POA: Diagnosis not present

## 2018-06-18 DIAGNOSIS — L218 Other seborrheic dermatitis: Secondary | ICD-10-CM | POA: Diagnosis not present

## 2018-06-18 DIAGNOSIS — L821 Other seborrheic keratosis: Secondary | ICD-10-CM | POA: Diagnosis not present

## 2018-06-24 DIAGNOSIS — M25562 Pain in left knee: Secondary | ICD-10-CM | POA: Diagnosis not present

## 2018-06-29 DIAGNOSIS — M25562 Pain in left knee: Secondary | ICD-10-CM | POA: Diagnosis not present

## 2018-07-11 DIAGNOSIS — J00 Acute nasopharyngitis [common cold]: Secondary | ICD-10-CM | POA: Diagnosis not present

## 2018-07-22 DIAGNOSIS — S83242A Other tear of medial meniscus, current injury, left knee, initial encounter: Secondary | ICD-10-CM | POA: Diagnosis not present

## 2018-07-22 DIAGNOSIS — M1712 Unilateral primary osteoarthritis, left knee: Secondary | ICD-10-CM | POA: Diagnosis not present

## 2018-07-22 DIAGNOSIS — M25562 Pain in left knee: Secondary | ICD-10-CM | POA: Insufficient documentation

## 2018-08-04 ENCOUNTER — Encounter: Payer: Self-pay | Admitting: Adult Health

## 2018-08-06 ENCOUNTER — Other Ambulatory Visit: Payer: Self-pay

## 2018-08-06 ENCOUNTER — Encounter: Payer: Self-pay | Admitting: Neurology

## 2018-08-06 ENCOUNTER — Ambulatory Visit (INDEPENDENT_AMBULATORY_CARE_PROVIDER_SITE_OTHER): Payer: Commercial Managed Care - PPO | Admitting: Neurology

## 2018-08-06 VITALS — BP 143/92 | HR 70 | Ht 72.0 in | Wt 181.0 lb

## 2018-08-06 DIAGNOSIS — G4733 Obstructive sleep apnea (adult) (pediatric): Secondary | ICD-10-CM

## 2018-08-06 DIAGNOSIS — Z9989 Dependence on other enabling machines and devices: Secondary | ICD-10-CM

## 2018-08-06 NOTE — Patient Instructions (Addendum)
You have continued to do well on CPAP, please be sure to change your filter every 1-2 months, your headgear about every 3 months, pillow insert about monthly, hose about 6 months, humidifier chamber about yearly. Some restrictions are imposed by her insurance carrier with regard to how frequently you can get certain supplies.   Please continue using your CPAP regularly. While your insurance requires that you use CPAP at least 4 hours each night on 70% of the nights, I recommend, that you not skip any nights and use it throughout the night if you can. Getting used to CPAP and staying with the treatment long term does take time and patience and discipline. Untreated obstructive sleep apnea when it is moderate to severe can have an adverse impact on cardiovascular health and raise her risk for heart disease, arrhythmias, hypertension, congestive heart failure, stroke and diabetes. Untreated obstructive sleep apnea causes sleep disruption, nonrestorative sleep, and sleep deprivation. This can have an impact on your day to day functioning and cause daytime sleepiness and impairment of cognitive function, memory loss, mood disturbance, and problems focussing. Using CPAP regularly can improve these symptoms.  Keep up the good work! We can see you in 1 year, you can see one of our nurse practitioners as you are stable. I will see you after that.

## 2018-08-06 NOTE — Progress Notes (Signed)
Subjective:    Patient ID: Brandon Barry is Brandon 43 y.o. male.  HPI     Interim history:   Brandon Barry is Brandon Barry with an underlying medical history of HTN, and borderline overweight state, who presents for follow-up consultation of Brandon Barry obstructive sleep apnea, on CPAP therapy. The patient is unaccompanied today. I last saw him on 01/31/2018, at which time we talked about Brandon Barry sleep study results. Brandon Barry was fully compliant with CPAP and reported improvement in Brandon Barry sleep quality, sleep consolidation and daytime tiredness. Brandon Barry had some flareup of restless sleep but no significant RLS symptoms.  Today, 08/06/2018: I reviewed Brandon Barry CPAP compliance data from 07/06/2018 through 08/04/2018 which is Brandon total of 30 days, during which time Brandon Barry used Brandon Barry CPAP every night with percent used days greater than 4 hours at 100%, indicating superb compliance with an average usage of 7 hours and 16 minutes, residual AHI at goal at 0.8 per hour, leak on the low side with the 95th percentile at 10 L/m on Brandon pressure of 8 cm with EPR of 3. Brandon Barry reports CPAP is going well. Brandon Barry continues to benefit from it. Brandon Barry has not had any significant restless legs flareup. Brandon Barry has the occasional symptom, maybe some leg movements at night but nothing bothersome for now. Brandon Barry does change the filter on Brandon regular basis on Brandon Barry CPAP machine but has not had any new nasal pillows.  The patient's allergies, current medications, family history, past medical history, past social history, past surgical history and problem list were reviewed and updated as appropriate.    Previously:      I first met him on 08/21/2017 at the request of Brandon Barry primary care physician, at which time Brandon Barry reported snoring and daytime somnolence and Brandon family history of OSA. Brandon Barry wife had noted pauses in Brandon Barry breathing while Brandon Barry was asleep. Brandon Barry had Brandon baseline sleep study, followed by Brandon CPAP titration study. I went over Brandon Barry test results with him in detail today.  Baseline sleep study from 10/08/2017 showed Brandon sleep latency delayed at 108 minutes, REM latency was 62.5 minutes, sleep efficiency reduced. Brandon Barry had an increased percentage of light stage sleep, slow-wave sleep was reduced and REM sleep was also reduced. Brandon Barry total AHI was 11.9 per hour, REM AHI was 5.9 per hour, supine AHI was 35.6 per hour. Average oxygen saturation was 94%, nadir was 85%. Brandon Barry did not have any significant PLMS. Based on Brandon Barry sleep related complaints and test results Brandon Barry was advised to proceed with Brandon CPAP titration study. Brandon Barry had this on 10/18/2017. Sleep latency was 24 minutes, REM latency was 60.5 minutes, sleep efficiency 87.1%. Brandon Barry had 11.8% of slow-wave sleep and Brandon normal percentage of REM sleep. Brandon Barry was fitted with nasal pillows and CPAP was titrated from 5 cm to 9 cm. At Brandon pressure of 8 cm Brandon Barry AHI was 0.5 per hour with supine REM sleep achieved an O2 nadir of 93%. Brandon Barry was noted to have mild PLMS during the study with no significant arousals. Based on Brandon Barry test results I suggested Brandon Barry proceed with CPAP therapy at home at Brandon pressure of 8 cm.  I reviewed Brandon Barry CPAP compliance data from 12/31/2017 through 01/29/2018 which is Brandon total of 30 days during which time Brandon Barry used Brandon Barry CPAP every night with percent used days greater than 4 hours at 100%, indicating superb compliance with an average usage of 7 hours and 0 minutes, residual AHI at goal at 0.8 per  hour, leak quite low with the 95th percentile at 1.8 L/m on Brandon pressure of 8 cm with EPR of 3.   08/21/2017: (Brandon Barry) reports snoring and excessive daytime somnolence. I reviewed your office note from 08/13/2013, which you kindly included. Brandon Barry Epworth sleepiness score is 12 out of 24 today, fatigue score is 40 out of 63. Brandon Barry lives at home with Brandon Barry wife and 2 children. Brandon Barry quit smoking, drinks alcohol occasionally, maybe 2-3 times Brandon week and coffee in the morning. Brandon Barry has had elevated blood pressure values for some time, Brandon Barry has been tracking Brandon Barry blood pressure at home  and has had higher numbers Brandon Barry admits. Brandon Barry has not been on hypertension medication. Brandon Barry reports Brandon family history of OSA in Brandon Barry father who uses Brandon CPAP machine. Patient has been snoring for some time but it has become worse with time. Brandon Barry wife has noted apneic pauses in Brandon Barry breathing while Brandon Barry is asleep. Brandon Barry does not wake up rested. Brandon Barry has Brandon history of restless leg syndrome and has had leg movements in the past and was treated symptomatically with Klonopin as Brandon Barry recalls. Brandon Barry tapered it off some time ago. Brandon Barry remembers having Brandon sleep study some time ago, maybe 10 years ago at which time Brandon Barry sleep apnea was borderline as Brandon Barry recalls. Brandon Barry works as Brandon Psychologist, clinical. Brandon Barry has 2 children ages 15 and 59. Bedtime is fairly structured around 10 or 10:30 and rise time is 5:30. Brandon Barry does not have night to night nocturia but has woken up with Brandon headache on the rare occasion. Brandon Barry quit smoking over 10 years ago and limits Brandon Barry caffeine to one cup of coffee per morning on average, does not typically drink sodas.  Brandon Barry Past Medical History Is Significant For: Past Medical History:  Diagnosis Date  . Chronic back pain   . Hyperlipidemia   . Right shoulder pain     Brandon Barry Past Surgical History Is Significant For: Past Surgical History:  Procedure Laterality Date  . WISDOM TOOTH EXTRACTION      Brandon Barry Family History Is Significant For: Family History  Problem Relation Age of Onset  . Heart attack Father   . Cancer Paternal Grandmother   . Cancer Paternal Grandfather     Brandon Barry Social History Is Significant For: Social History   Socioeconomic History  . Marital status: Single    Spouse name: Not on file  . Number of children: Not on file  . Years of education: Not on file  . Highest education level: Not on file  Occupational History  . Not on file  Social Needs  . Financial resource strain: Not on file  . Food insecurity:    Worry: Not on file    Inability: Not on file  . Transportation needs:    Medical: Not on file     Non-medical: Not on file  Tobacco Use  . Smoking status: Former Research scientist (life sciences)  . Smokeless tobacco: Never Used  Substance and Sexual Activity  . Alcohol use: Yes  . Drug use: No  . Sexual activity: Not on file  Lifestyle  . Physical activity:    Days per week: Not on file    Minutes per session: Not on file  . Stress: Not on file  Relationships  . Social connections:    Talks on phone: Not on file    Gets together: Not on file    Attends religious service: Not on file    Active member of club or organization:  Not on file    Attends meetings of clubs or organizations: Not on file    Relationship status: Not on file  Other Topics Concern  . Not on file  Social History Narrative  . Not on file    Brandon Barry Allergies Are:  No Known Allergies:   Brandon Barry Current Medications Are:  No outpatient encounter medications on file as of 08/06/2018.   No facility-administered encounter medications on file as of 08/06/2018.   :  Review of Systems:  Out of Brandon complete 14 point review of systems, all are reviewed and negative with the exception of these symptoms as listed below: Review of Systems  Neurological:       Pt presents today to discuss Brandon Barry cpap. Pt reports that Brandon Barry cpap is going well.    Objective:  Neurological Exam  Physical Exam Physical Examination:   Vitals:   08/06/18 1457  BP: (!) 143/92  Pulse: 70    General Examination: The patient is Brandon very pleasant 43 y.o. male in no acute distress. Brandon Barry appears well-developed and well-nourished and well groomed.   HEENT:Normocephalic, atraumatic, pupils are equal, round and reactive to light and accommodation. Extraocular tracking is good without limitation to gaze excursion or nystagmus noted. Normal smooth pursuit is noted. Hearing is grossly intact. Face is symmetric with normal facial animation and normal facial sensation. Speech is clear with no dysarthria noted. There is no hypophonia. There is no lip, neck/head, jaw or voice tremor.  Neck with FROM. Oropharynx exam reveals: mildmouth dryness, adequatedental hygiene and moderateairway crowding.Tongue protrudes centrally and palate elevates symmetrically.   Chest:Clear to auscultation without wheezing, rhonchi or crackles noted.  Heart:S1+S2+0, regular and normal without murmurs, rubs or gallops noted.   Abdomen:Soft, non-tender and non-distended.  Extremities:There isnopitting edema in the distal lower extremities bilaterally.   Skin: Warm and dry without trophic changes noted.   Musculoskeletal: exam reveals no obvious joint deformities, tenderness or joint swelling or erythema.   Neurologically:  Mental status: The patient is awake, alert and oriented in all 4 spheres.Hisimmediate and remote memory, attention, language skills and fund of knowledge are appropriate. There is no evidence of aphasia, agnosia, apraxia or anomia. Speech is clear with normal prosody and enunciation. Thought process is linear. Mood is normaland affect is normal.  Cranial nerves II - XII are as described above under HEENT exam.  Motor exam: Normal bulk, strength and tone is noted. There is tremor. Romberg is negative. Fine motor skills and coordination: grossly intact.  Cerebellar testing: No dysmetria or intention tremor. There is no truncal or gait ataxia.  Sensory exam: intact to light touch in the upper and lower extremities.  Gait, station and balance:Hestands easily. No veering to one side is noted. No leaning to one side is noted. Posture is age-appropriate and stance is narrow based. Gait showsnormalstride length and normalpace. No problems turning are noted. Tandem walk isunremarkable.  Assessmentand Plan:  In summary,Culley C Basilio Meadow Brandon very pleasant 56 year oldmalewith Brandon history of elevated blood pressure values and prior history of restless leg syndrome, who presents for follow-up consultation of Brandon Barry obstructive sleep apnea, established on CPAP therapy  with full compliance and ongoing good results. Brandon Barry is commended for Brandon Barry treatment adherence. We will monitor for restless leg symptoms. Brandon Barry is currently not particularly bothered by any restless legs symptoms or PLMS. Brandon Barry baseline sleep study from April 2019 showed evidence of overall mild obstructive sleep apnea, much more pronounced during supine sleep. Brandon Barry had Brandon CPAP  titration study in May 2019 and did quite well with CPAP of 8 cm. Brandon Barry did have mild PLMS during this study without significant sleep disruption. When Brandon Barry started CPAP, Brandon Barry noticed improvement in Brandon Barry sleep quality, sleep consolidation and daytime tiredness. Brandon Barry is reminded to change Brandon Barry CPAP related supplies and regular basis. Brandon Barry is encouraged to call Brandon Barry DME company for Brandon replacement of Brandon Barry nasal pillows. I recommended Brandon follow-up routinely in sleep clinic in one year, Brandon Barry can see Ward Givens, nurse practitioner. I answered all Brandon Barry questions today and Brandon Barry was in agreement. I spent 15 minutes in total face-to-face time with the patient, more than 50% of which was spent in counseling and coordination of care, reviewing test results, reviewing medication and discussing or reviewing the diagnosis of OSA, its prognosis and treatment options. Pertinent laboratory and imaging test results that were available during this visit with the patient were reviewed by me and considered in my medical decision making (see chart for details).

## 2018-11-19 HISTORY — PX: KNEE ARTHROSCOPY: SHX127

## 2019-02-04 ENCOUNTER — Ambulatory Visit
Admission: RE | Admit: 2019-02-04 | Discharge: 2019-02-04 | Disposition: A | Discharge status: Home / Self Care | Payer: Commercial Managed Care - PPO | Source: Ambulatory Visit | Attending: Gastroenterology | Admitting: Gastroenterology

## 2019-02-04 ENCOUNTER — Other Ambulatory Visit: Payer: Self-pay | Admitting: Gastroenterology

## 2019-02-04 DIAGNOSIS — R933 Abnormal findings on diagnostic imaging of other parts of digestive tract: Secondary | ICD-10-CM

## 2019-02-04 HISTORY — PX: COLONOSCOPY: SHX174

## 2019-02-04 MED ORDER — IOPAMIDOL (ISOVUE-300) INJECTION 61%
100.0000 mL | Freq: Once | INTRAVENOUS | Status: AC | PRN
  Administered 2019-02-04: 100 mL via INTRAVENOUS

## 2019-02-06 ENCOUNTER — Encounter: Payer: Self-pay | Admitting: *Deleted

## 2019-02-06 DIAGNOSIS — C20 Malignant neoplasm of rectum: Secondary | ICD-10-CM

## 2019-02-06 NOTE — Progress Notes (Signed)
  Oncology Nurse Navigator Documentation  Navigator Location: [facility] (02/06/19 1100) Referral Date to RadOnc/MedOnc: 02/05/19 (02/06/19 1100) )Navigator Encounter Type: Telephone (02/06/19 1100)  Received IB message from Dr. Benson Norway regarding patient with new diagnosis of rectal cancer.  Obtained new patient appointment from Dr. Luberta Robertson, NP and forwarded information to new patient scheduler.  Per order of Dr. Benay Spice, referral placed to Radiation Oncology.  I called Brandon Barry to make him aware of the appointment and where to come.  I gave him my contact information and instructed him to call me if he has any questions.  Patient verbalized understanding.                         Barriers/Navigation Needs: Coordination of Care(Scheduled new patient appointment) (02/06/19 1100)   Interventions: Coordination of Care (02/06/19 1100)                      Time Spent with Patient: 15 (02/06/19 1100)

## 2019-02-07 ENCOUNTER — Encounter: Payer: Self-pay | Admitting: Gastroenterology

## 2019-02-10 ENCOUNTER — Telehealth: Payer: Self-pay | Admitting: Radiation Oncology

## 2019-02-10 ENCOUNTER — Other Ambulatory Visit: Payer: Self-pay | Admitting: Radiation Oncology

## 2019-02-10 DIAGNOSIS — C20 Malignant neoplasm of rectum: Secondary | ICD-10-CM

## 2019-02-10 NOTE — Progress Notes (Signed)
Spoke with patient this AM to introduce role of GI navigator and to provide direct contact information. Brief outline of what to expect at initial consult with Ned Card and Dr. Benay Spice. Patient understands zero visitation policy and explained that he would like his wife to be involved in his treatment planning. He understands that she can be included in appointment via telephone. I also advised that Shona Simpson PA has ordered a staging MRI. He will be contacted by the rad onc department with time and date of appointment.

## 2019-02-10 NOTE — Progress Notes (Addendum)
GI Location of Tumor / Histology: Malignant neoplasm of rectum  Brandon Barry presented with abdominal pain following a couple days of diarrhea.  His pain is now intermittent recurring every two weeks in his lower abdomen, lasting about 5 days.  He is having incomplete bowel movements, very small.  MRI Pelvis/ Liver ordered  CT abd/pelvis 02/04/2019: Apparent colorectal mass measuring 3.9 x 5.4 x 3.2 cm involving the distal sigmoid colon and proximal rectum with adjacent borderline enlarged suspicious mesorectal lymph nodes; indeterminate lesion in segment 6 of the liver measuring 1.4 x 1.1 cm.  Colonoscopy 02/04/2019: A fungating infiltrative and ulcerated non-obstructing large mass was found in the rectum.  The mass was circumferential.  The mass measures 3 cm in length.  No bleeding was present.  The mass was 10 cm from the anal verge and it was not palpable with the rectal examination.    Biopsies of Rectum 02/04/2019    Past/Anticipated interventions by surgeon, if any:   Past/Anticipated interventions by medical oncology, if any:  Dr. Benay Spice NP [name] 02/11/2019 - He has been referred for MRIs of the pelvis and liver for additional staging information.  At today's visit he reports a small piece of metal is lodged in the left abdominal wall.  We will contact MRI to see if this precludes the above studies.  If he is unable to have an MRI we will refer him back to Dr. Benson Norway for lower endoscopic ultrasound and also obtain a PET scan.  Weight changes, if any: 10 pounds down over the last couple of months.  Bowel/Bladder complaints, if any: No constipation/diarrhea.  Has issues going.  Nausea / Vomiting, if any: No  Pain issues, if any:  Has intermittent abdominal pain 8/10 and constant milder pain.  Any blood per rectum: Occasional blood noted in stool.     SAFETY ISSUES:  Prior radiation? No  Pacemaker/ICD? No  Possible current pregnancy? n/a  Is the patient on methotrexate?    Current Complaints/Details:

## 2019-02-11 ENCOUNTER — Other Ambulatory Visit: Payer: Self-pay | Admitting: Radiation Oncology

## 2019-02-11 ENCOUNTER — Ambulatory Visit
Admission: RE | Admit: 2019-02-11 | Discharge: 2019-02-11 | Disposition: A | Discharge status: Home / Self Care | Payer: Commercial Managed Care - PPO | Source: Ambulatory Visit | Attending: Radiation Oncology | Admitting: Radiation Oncology

## 2019-02-11 ENCOUNTER — Telehealth: Payer: Self-pay | Admitting: Oncology

## 2019-02-11 ENCOUNTER — Inpatient Hospital Stay: Payer: Commercial Managed Care - PPO | Attending: Nurse Practitioner | Admitting: Nurse Practitioner

## 2019-02-11 ENCOUNTER — Encounter: Payer: Self-pay | Admitting: Nurse Practitioner

## 2019-02-11 ENCOUNTER — Other Ambulatory Visit: Payer: Self-pay

## 2019-02-11 VITALS — BP 143/103 | HR 65 | Temp 98.5°F | Resp 17 | Ht 72.0 in | Wt 172.2 lb

## 2019-02-11 DIAGNOSIS — Z79899 Other long term (current) drug therapy: Secondary | ICD-10-CM | POA: Insufficient documentation

## 2019-02-11 DIAGNOSIS — C20 Malignant neoplasm of rectum: Secondary | ICD-10-CM

## 2019-02-11 DIAGNOSIS — K5900 Constipation, unspecified: Secondary | ICD-10-CM | POA: Insufficient documentation

## 2019-02-11 DIAGNOSIS — C19 Malignant neoplasm of rectosigmoid junction: Secondary | ICD-10-CM | POA: Insufficient documentation

## 2019-02-11 DIAGNOSIS — Z5111 Encounter for antineoplastic chemotherapy: Secondary | ICD-10-CM | POA: Insufficient documentation

## 2019-02-11 DIAGNOSIS — Z87891 Personal history of nicotine dependence: Secondary | ICD-10-CM | POA: Insufficient documentation

## 2019-02-11 DIAGNOSIS — E785 Hyperlipidemia, unspecified: Secondary | ICD-10-CM | POA: Insufficient documentation

## 2019-02-11 NOTE — Progress Notes (Signed)
Met with patient during initial consults this morning. Was able to video chat with wife  to introduce role of navigation. Educational materials on Colorectal Cancer provided. Contact numbers for tx team members provided. Information on Symptom Management clinic provided. Patient has my direct contact information for questions or concerns. Will continue to support as needed.

## 2019-02-11 NOTE — Telephone Encounter (Signed)
Gave avs and calendar ° °

## 2019-02-11 NOTE — Progress Notes (Addendum)
New Hematology/Oncology Consult   Requesting MD: Dr. Carol Ada  [phone]  Reason for Consult: Rectal cancer  HPI: Brandon Barry is a 43 year old man who recently presented with intermittent abdominal pain and a change in bowel habits.  He underwent a colonoscopy by Dr. Benson Norway on 02/04/2019 with findings of a fungating infiltrative and ulcerated nonobstructing large mass in the rectum.  The mass was circumferential measuring 3 cm in length.  There was no bleeding present.  The mass was 10 cm from the anal verge.  Biopsy showed invasive adenocarcinoma, moderately differentiated; MLH1, MSH2, MSH6 and PMS2 intact.  CTs abdomen and pelvis 02/04/2019 showed an apparent colorectal mass measuring 3.9 x 5.4 x 3.2 cm involving the distal sigmoid colon and proximal rectum with adjacent borderline enlarged suspicious mesorectal lymph nodes; indeterminate lesion in segment 6 of the liver measuring 1.4 x 1.1 cm.     Past Medical History:  Diagnosis Date  . Chronic back pain   . Hyperlipidemia   . Right shoulder pain   :   Past Surgical History:  Procedure Laterality Date  . WISDOM TOOTH EXTRACTION    He reports using a CPAP at nighttime  Medications: Hydrocodone as needed  No Known Allergies:  FH: Mother is alive and well.  Father with hypertension.  Maternal grandfather with prostate cancer.  Paternal grandfather with lung cancer.  2 sisters in good health.  Maternal aunt with non-Hodgkin's lymphoma.  Maternal cousin with colon cancer.  SOCIAL HISTORY: He lives in [place].  He is married.  He has 2 sons ages 61 and 46.  He is a Advice worker.  Quit smoking 15 years ago.  Occasional alcohol intake.  Review of Systems: Pain is located at the rectal region and has been occurring more frequently since the colonoscopy.  He is having small frequent bowel movements.  He periodically notes blood.  No fevers or sweats.  He has lost about 10 pounds over the past several months.  No  anorexia.  He denies nausea/vomiting.  No dysphagia.  No shortness of breath or cough.  No chest pain.  No numbness or tingling in the hands or feet.  No urinary symptoms.  He reports a small piece of metal from a chisel left abdominal wall.  Physical Exam:  Blood pressure (!) 155/102, pulse 65, temperature 98.5 F (36.9 C), temperature source Oral, resp. rate 17, height 6' (1.829 m), weight 172 lb 3.2 oz (78.1 kg), SpO2 100 %.  HEENT: Pupils equal round and reactive to light.  Sclerae anicteric.  Oropharynx is without thrush or ulceration.  Neck without mass. Abdomen: Abdomen soft and nontender.  No hepatomegaly.  No mass. Vascular: No leg edema. Lymph nodes: No palpable cervical, supraclavicular, axillary or inguinal lymph nodes. Neurologic: Alert and oriented.  Motor strength 5/5.  Knee DTRs 2+, symmetric. Skin: No rash.  LABS: None  RADIOLOGY:  Ct Abdomen Pelvis W Contrast  Result Date: 02/04/2019 CLINICAL DATA:  43 year old male with history of colon cancer diagnosed from colonoscopy this morning. Mid lower abdominal pain for the past 1 and half months. EXAM: CT ABDOMEN AND PELVIS WITH CONTRAST TECHNIQUE: Multidetector CT imaging of the abdomen and pelvis was performed using the standard protocol following bolus administration of intravenous contrast. CONTRAST:  157m ISOVUE-300 IOPAMIDOL (ISOVUE-300) INJECTION 61% COMPARISON:  None. FINDINGS: Lower chest: Unremarkable. Hepatobiliary: In segment 6 of the liver (axial image 21 of series 2) there is a 1.4 x 1.1 cm hypovascular lesion which remains slightly hypovascular in  appearance on delayed images, but is incompletely characterized. In the right lobe of the liver predominantly in segment 7 there is a 4.1 x 2.9 x 4.1 cm centrally low-attenuation lesion which demonstrates peripheral nodular hyperenhancement and progressive centripetal filling, diagnostic of a cavernous hemangioma. No intra or extrahepatic biliary ductal dilatation. Pancreas:  No pancreatic mass. No pancreatic ductal dilatation. No pancreatic or peripancreatic fluid collections or inflammatory changes. Spleen: Unremarkable. Adrenals/Urinary Tract: Bilateral kidneys and adrenal glands are normal in appearance. No hydroureteronephrosis. Urinary bladder is normal in appearance. Stomach/Bowel: Normal appearance of the stomach. No pathologic dilatation of small bowel or colon. In the region of the rectosigmoid junction there is asymmetric mural thickening and enhancement suspicious for primary colorectal neoplasm (axial image 70 of series 2 and sagittal image 85 of series 8) measuring 3.9 x 5.4 x 3.2 cm. In the left mesocolon (axial image 67 of series 2) there is a 6 mm soft tissue attenuation nodule suspicious for a mesorectal lymph node or focal soft tissue extension. Other nonenlarged but prominent mesorectal lymph nodes are noted. Normal appendix. Vascular/Lymphatic: No significant atherosclerotic disease, aneurysm or dissection noted in the abdominal or pelvic vasculature. Borderline enlarged mesorectal lymph nodes, as above. No other lymphadenopathy noted elsewhere in the abdomen or pelvis. Reproductive: Severe median lobe hypertrophy of the prostate gland. Seminal vesicles are unremarkable in appearance. Other: No significant volume of ascites.  No pneumoperitoneum. Musculoskeletal: There are no aggressive appearing lytic or blastic lesions noted in the visualized portions of the skeleton. IMPRESSION: 1. Apparent colorectal mass measuring 3.9 x 5.4 x 3.2 cm involving the distal sigmoid colon and proximal rectum with adjacent borderline enlarged suspicious mesorectal lymph nodes, as above. 2. In addition, there is an indeterminate lesion in segment 6 of the liver measuring 1.4 x 1.1 cm. The possibility of a solitary hepatic metastasis should be considered, and definitive characterization with nonemergent MRI of the abdomen with and without IV gadolinium is strongly recommended in the  near future. 3. There is also a cavernous hemangioma in segment 7 of the liver, as above. 4. Additional incidental findings, as above. Electronically Signed   By: Vinnie Langton M.D.   On: 02/04/2019 11:15    Assessment and Plan:   1. Rectosigmoid cancer   Colonoscopy by Dr. Benson Norway 02/04/2019-fungating infiltrative and ulcerated nonobstructing large mass in the rectum.  The mass was circumferential measuring 3 cm in length.  There was no bleeding present.  The mass was 10 cm from the anal verge.  Biopsy showed invasive adenocarcinoma, moderately differentiated; MLH1, MSH2, MSH6 and PMS2 intact.    CTs abdomen and pelvis 02/04/2019-apparent colorectal mass measuring 3.9 x 5.4 x 3.2 cm involving the distal sigmoid colon and proximal rectum with adjacent borderline enlarged suspicious mesorectal lymph nodes; indeterminate lesion in segment 6 of the liver measuring 1.4 x 1.1 cm. 2. Change in bowel habits/rectal pain secondary to #1  Brandon Barry recently presented with rectal pain and a change in bowel habits.  On colonoscopy he was found to have a rectal mass 10 cm from the anal verge.  Biopsy confirmed invasive adenocarcinoma.  Staging CTs abdomen/pelvis showed a colorectal mass involving the distal sigmoid colon/proximal rectum with adjacent borderline enlarged mesorectal lymph nodes and an indeterminate liver lesion.  He has been referred for MRIs of the pelvis and liver for additional staging information.  At today's visit he reports a small piece of metal is lodged in the left abdominal wall.  We will contact MRI to see if  this precludes the above studies.  If he is unable to have an MRI we will refer him back to Dr. Benson Norway for lower endoscopic ultrasound and also obtain a PET scan.  He will return for a follow-up visit on 02/19/2019.  Patient seen with Dr. Benay Spice.  CT images reviewed on the computer with Brandon Barry.  45 minutes were spent face-to-face at today's visit with the majority of that time involved  in counseling/coordination of care.  His wife was present by phone for today's visit.   Brandon Card, NP 02/11/2019, 8:47 AM   This was a shared visit with Brandon Barry.  Brandon Barry was interviewed and examined.  He has been diagnosed with rectal versus rectosigmoid adenocarcinoma.  We discussed treatment options with Brandon Barry.  His wife was present by telephone for today's visit.  We reviewed CT images.  He will undergo additional diagnostic evaluation to include a pelvic MRI and liver MRI prior to deciding on a treatment plan.  He understands treatment options would vary based on the nature of the liver lesion, distance of the tumor from the anal verge, and the T/N staging.  We will refer him for a staging PET scan and rectal EUS if he is unable to undergo an MRI.   His case will be presented at the GI tumor conference.  Julieanne Manson, MD

## 2019-02-11 NOTE — Progress Notes (Signed)
Radiation Oncology         [phone] ________________________________  Name: Brandon Barry        MRN: [id number]  Date of Service: 02/11/2019 DOB: 1976-01-29  [id number], [facility], Izola Price, MD     REFERRING PHYSICIAN: Ladell Pier, MD   DIAGNOSIS: The encounter diagnosis was Malignant neoplasm of rectum [facility]).   HISTORY OF PRESENT ILLNESS: Brandon Barry is a 43 y.o. male seen at the request of Dr. Benson Norway for a newly diagnosed cancer of the rectum. The patient reports about a 2 month history of increasing difficulty with pain passing stool and occasional blood per rectum. He was seen by Dr. Benson Norway and underwent a colonoscopy on 02/04/2019 revealing a rectal mass 10 cm from the anal verge that was not palpable on rectal examination. The mass measured 3 cm and was circumferential, and nonobstructing. A biopsy was consistent with a moderately differentiated adenocarcinoma. He underwent a CT of the abdomen and pelvis that revealed a  1.4 x 1.1 cm hypovascular lesion in segment 6 of the liver and in the right lobe of the liver, there was a 4.1 x 2.9 x 4.1 cm lesion in the central segment of segment 7. There was assymetric mural thickening and enhancement in the rectum about the rectosigmoid junction, and this measured 3.9 x 5.4 x 3.2 cm, and there was a 6 mm soft tissue nodule suspicious for a mesorectal node or focal extension. Other nonenlarged but prominent mesorectal nodes were noted. He is seen today via Webex in the clinic to discuss the role of radiotherapy. An MRI of the liver and pelvis are both ordered. Apparently the patient had an accident a few years ago that left a small piece of metal in his left abdominal wall.    PREVIOUS RADIATION THERAPY: No   PAST MEDICAL HISTORY:  Past Medical History:  Diagnosis Date   Chronic back pain    Hyperlipidemia    Right shoulder pain        PAST SURGICAL HISTORY: Past Surgical History:  Procedure Laterality Date    WISDOM TOOTH EXTRACTION       FAMILY HISTORY:  Family History  Problem Relation Age of Onset   Heart attack Father    Cancer Paternal Grandmother    Cancer Paternal Grandfather      SOCIAL HISTORY:  reports that he has quit smoking. He has never used smokeless tobacco. He reports current alcohol use. He reports that he does not use drugs. 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   ALLERGIES: Patient has no known allergies.   MEDICATIONS:  Current Outpatient Medications  Medication Sig Dispense Refill   HYDROcodone-acetaminophen (NORCO/VICODIN) 5-325 MG tablet Take 5-325 tablets by mouth 3 (three) times daily.     ibuprofen (ADVIL) 100 MG tablet ibuprofen  prn     No current facility-administered medications for this encounter.      REVIEW OF SYSTEMS: On review of systems, the patient reports that he is doing fairly well overall. He has occasional blood per rectum and notes pain with passing stool and he's had progressive pain for several hours after bowel movements. He denies any chest pain, shortness of breath, cough, fevers, chills, night sweats, unintended weight changes. He denies any  bladder disturbances, and denies abdominal pain, nausea or vomiting. He denies any new musculoskeletal or joint aches or pains. A complete review of  systems is obtained and is otherwise negative.     PHYSICAL EXAM:  Wt Readings from Last 3 Encounters:  02/11/19 172 lb 3.2 oz (78.1 kg)  08/06/18 181 lb (82.1 kg)  01/31/18 184 lb (83.5 kg)   Temp Readings from Last 3 Encounters:  02/11/19 98.5 F (36.9 C) (Oral)   BP Readings from Last 3 Encounters:  02/11/19 (!) 143/103  08/06/18 (!) 143/92  01/31/18 (!) 142/94   Pulse Readings from Last 3 Encounters:  02/11/19 65  08/06/18 70  01/31/18 73   In general this is a well appearing caucasian male in no acute distress. He's alert and oriented x4 and  appropriate throughout the examination. Cardiopulmonary assessment is negative for acute distress and he exhibits normal effort.    ECOG = 1  0 - Asymptomatic (Fully active, able to carry on all predisease activities without restriction)  1 - Symptomatic but completely ambulatory (Restricted in physically strenuous activity but ambulatory and able to carry out work of a light or sedentary nature. For example, light housework, office work)  2 - Symptomatic, <50% in bed during the day (Ambulatory and capable of all self care but unable to carry out any work activities. Up and about more than 50% of waking hours)  3 - Symptomatic, >50% in bed, but not bedbound (Capable of only limited self-care, confined to bed or chair 50% or more of waking hours)  4 - Bedbound (Completely disabled. Cannot carry on any self-care. Totally confined to bed or chair)  5 - Death   Eustace Pen MM, Creech RH, Tormey DC, et al. [phone]). "Toxicity and response criteria of the [facility]". [place] Oncol. 5 (6): 649-55    LABORATORY DATA:  No results found for: WBC, HGB, HCT, MCV, PLT No results found for: NA, K, CL, CO2 No results found for: ALT, AST, GGT, ALKPHOS, BILITOT    RADIOGRAPHY: Ct Abdomen Pelvis W Contrast  Result Date: 02/04/2019 CLINICAL DATA:  43 year old male with history of colon cancer diagnosed from colonoscopy this morning. Mid lower abdominal pain for the past 1 and half months. EXAM: CT ABDOMEN AND PELVIS WITH CONTRAST TECHNIQUE: Multidetector CT imaging of the abdomen and pelvis was performed using the standard protocol following bolus administration of intravenous contrast. CONTRAST:  165mL ISOVUE-300 IOPAMIDOL (ISOVUE-300) INJECTION 61% COMPARISON:  None. FINDINGS: Lower chest: Unremarkable. Hepatobiliary: In segment 6 of the liver (axial image 21 of series 2) there is a 1.4 x 1.1 cm hypovascular lesion which remains slightly hypovascular in appearance on delayed images,  but is incompletely characterized. In the right lobe of the liver predominantly in segment 7 there is a 4.1 x 2.9 x 4.1 cm centrally low-attenuation lesion which demonstrates peripheral nodular hyperenhancement and progressive centripetal filling, diagnostic of a cavernous hemangioma. No intra or extrahepatic biliary ductal dilatation. Pancreas: No pancreatic mass. No pancreatic ductal dilatation. No pancreatic or peripancreatic fluid collections or inflammatory changes. Spleen: Unremarkable. Adrenals/Urinary Tract: Bilateral kidneys and adrenal glands are normal in appearance. No hydroureteronephrosis. Urinary bladder is normal in appearance. Stomach/Bowel: Normal appearance of the stomach. No pathologic dilatation of small bowel or colon. In the region of the rectosigmoid junction there is asymmetric mural thickening and enhancement suspicious for primary colorectal neoplasm (axial image 70 of series 2 and sagittal image 85 of series 8) measuring 3.9 x 5.4 x 3.2 cm. In the left mesocolon (axial image 67 of series 2) there is a 6 mm soft tissue attenuation nodule suspicious for a mesorectal  lymph node or focal soft tissue extension. Other nonenlarged but prominent mesorectal lymph nodes are noted. Normal appendix. Vascular/Lymphatic: No significant atherosclerotic disease, aneurysm or dissection noted in the abdominal or pelvic vasculature. Borderline enlarged mesorectal lymph nodes, as above. No other lymphadenopathy noted elsewhere in the abdomen or pelvis. Reproductive: Severe median lobe hypertrophy of the prostate gland. Seminal vesicles are unremarkable in appearance. Other: No significant volume of ascites.  No pneumoperitoneum. Musculoskeletal: There are no aggressive appearing lytic or blastic lesions noted in the visualized portions of the skeleton. IMPRESSION: 1. Apparent colorectal mass measuring 3.9 x 5.4 x 3.2 cm involving the distal sigmoid colon and proximal rectum with adjacent borderline enlarged  suspicious mesorectal lymph nodes, as above. 2. In addition, there is an indeterminate lesion in segment 6 of the liver measuring 1.4 x 1.1 cm. The possibility of a solitary hepatic metastasis should be considered, and definitive characterization with nonemergent MRI of the abdomen with and without IV gadolinium is strongly recommended in the near future. 3. There is also a cavernous hemangioma in segment 7 of the liver, as above. 4. Additional incidental findings, as above. Electronically Signed   By: Vinnie Langton M.D.   On: 02/04/2019 11:15       IMPRESSION/PLAN: 1. Moderately differentiated adenocarcinoma of the rectum. Dr. Lisbeth Renshaw discusses the pathology findings and reviews the nature of rectal cancer. We discussed the findings and imaging to date. He needs an MRI of the pelvis for staging and planning purposes as well as of the liver to rule out metastatic disease. This would be preferred to EUS/PET. He will also need a CT chest for staging. Once these tests have been performed, we will have a clearer picture of his stage. Given the mesorectal nodes on CT however, it is suspicious that he has nodal involvement and would benefit from chemoRT. He may benefit from total neoadjuvant chemotherapy followed by chemoRT, then surgical resection, versus chemoRT and surgical resection followed by additional chemotherapy.  We discussed the risks, benefits, short, and long term effects of radiotherapy, and the patient is interested in proceeding. Dr. Lisbeth Renshaw discusses the delivery and logistics of radiotherapy and anticipates a course of 5 1/2 weeks of radiotherapy. We will reach out to general surgery to see if they can remove the retained metal, and follow up with plans to move forward with completing his staging work up. 2. Retained piece of metal in the abdominal wall. I've spoken with Dr. Orlene Plum and Cordie Grice, [facility] at [facility]. The recommendation is to have the piece of metal removed, in order  to proceed with MRI. He will be seen at [facility] tomorrow at 3 pm to try to explore the subcutaneous tissue. We will follow up with this expectantly.   This encounter was provided by telemedicine platform Webex.  The patient has given verbal consent for this type of encounter and has been advised to only accept a meeting of this type in a secure network environment. The time spent during this encounter was 60 minutes. The attendants for this meeting include Blenda Nicely, RN, Dr. Lisbeth Renshaw, Hayden Pedro  and [place] and his wife [name].  During the encounter,  Blenda Nicely, RN, Dr. Lisbeth Renshaw, and Hayden Pedro were located at [facility] Radiation Oncology Department.  Nydia Bouton Barry was located in the radiation clinic, and his wife was in her car in the cancer center parking lot.  The above documentation reflects my direct findings  during this shared patient visit. Please see the separate note by Dr. Lisbeth Renshaw on this date for the remainder of the patient's plan of care.    Carola Rhine, PAC

## 2019-02-12 ENCOUNTER — Telehealth: Payer: Self-pay | Admitting: Radiation Oncology

## 2019-02-12 ENCOUNTER — Telehealth: Payer: Self-pay

## 2019-02-12 ENCOUNTER — Telehealth: Payer: Self-pay | Admitting: *Deleted

## 2019-02-12 NOTE — Telephone Encounter (Signed)
Patient has second opinion at [facility] with Dr. Cloyd Stagers on 9/4. Advised patient to ask MRI staff to burn copy of MRI's (9/3) on disc to take to [facility]. Request made to upload CT chest from 8/25 to [employer].

## 2019-02-12 NOTE — Telephone Encounter (Signed)
CALLED PATIENT TO INFORM OF CT FOR 02-13-19 - ARRIVAL TIME- 12:15 PM @ [facility], PT. TO NOT HAVE FOOD - 4 HRS. PRIOR TO TEST, AND HE WILL BE HAVING MRI ON 02-21-19 - ARRIVAL TIME- 12:30 PM @ [facility], PT. TO BE NPO- 4 HRS. PRIOR TO TEST, LVM FOR  A RETURN CALL

## 2019-02-12 NOTE — Telephone Encounter (Signed)
I spoke with the patient after talking with Dr. [name]' PA and Dr. Kris Hartmann. The metal foreign body is not prohibitive in Dr. Huel Coventry opinion and the patient is willing to proceed with tomorrow's MRI without removing this. We will follow this expectantly.

## 2019-02-13 ENCOUNTER — Other Ambulatory Visit: Payer: Self-pay

## 2019-02-13 ENCOUNTER — Ambulatory Visit (HOSPITAL_COMMUNITY)
Admission: RE | Admit: 2019-02-13 | Discharge: 2019-02-13 | Disposition: A | Discharge status: Home / Self Care | Payer: Commercial Managed Care - PPO | Source: Ambulatory Visit | Attending: Radiation Oncology | Admitting: Radiation Oncology

## 2019-02-13 ENCOUNTER — Telehealth: Payer: Self-pay | Admitting: Radiation Oncology

## 2019-02-13 ENCOUNTER — Encounter (HOSPITAL_COMMUNITY): Payer: Self-pay

## 2019-02-13 DIAGNOSIS — C20 Malignant neoplasm of rectum: Secondary | ICD-10-CM

## 2019-02-13 MED ORDER — GADOBUTROL 1 MMOL/ML IV SOLN
8.0000 mL | Freq: Once | INTRAVENOUS | Status: AC | PRN
  Administered 2019-02-13: 8 mL via INTRAVENOUS

## 2019-02-13 MED ORDER — IOHEXOL 300 MG/ML  SOLN
75.0000 mL | Freq: Once | INTRAMUSCULAR | Status: AC | PRN
  Administered 2019-02-13: 75 mL via INTRAVENOUS

## 2019-02-13 MED ORDER — SODIUM CHLORIDE (PF) 0.9 % IJ SOLN
INTRAMUSCULAR | Status: AC
  Filled 2019-02-13: qty 50

## 2019-02-13 NOTE — Telephone Encounter (Signed)
I called the patient's wife to let her know the results of the CT of the chest.

## 2019-02-13 NOTE — Telephone Encounter (Signed)
l called the patient to let him know the results of his MRIs. Dr. Benay Spice has also reviewed and plan will be to follow the one lesion in the liver as it could still be hemangiomatous. He recommends total neoadjuvant chemotherapy followed by chemoRT, and subsequent surgery. He will meet with Dr. Cloyd Stagers tomorrow, with Dr. [name] on Tuesday, and with Dr. Benay Spice Wednesday to detail the plans moving forward for chemotherapy.

## 2019-02-14 ENCOUNTER — Telehealth: Payer: Self-pay | Admitting: Oncology

## 2019-02-14 NOTE — Telephone Encounter (Signed)
Returned patient's phone call regarding 09/09 appointment, patient would like to keep appointment as is.

## 2019-02-19 ENCOUNTER — Other Ambulatory Visit: Payer: Self-pay

## 2019-02-19 ENCOUNTER — Inpatient Hospital Stay (HOSPITAL_BASED_OUTPATIENT_CLINIC_OR_DEPARTMENT_OTHER): Payer: Commercial Managed Care - PPO | Admitting: Oncology

## 2019-02-19 ENCOUNTER — Telehealth: Payer: Self-pay | Admitting: Oncology

## 2019-02-19 ENCOUNTER — Inpatient Hospital Stay: Payer: Commercial Managed Care - PPO

## 2019-02-19 VITALS — BP 141/85 | HR 78 | Temp 98.5°F | Resp 18 | Ht 72.0 in | Wt 176.2 lb

## 2019-02-19 DIAGNOSIS — Z5111 Encounter for antineoplastic chemotherapy: Secondary | ICD-10-CM | POA: Diagnosis not present

## 2019-02-19 DIAGNOSIS — C20 Malignant neoplasm of rectum: Secondary | ICD-10-CM

## 2019-02-19 DIAGNOSIS — E785 Hyperlipidemia, unspecified: Secondary | ICD-10-CM | POA: Diagnosis not present

## 2019-02-19 DIAGNOSIS — Z87891 Personal history of nicotine dependence: Secondary | ICD-10-CM | POA: Diagnosis not present

## 2019-02-19 DIAGNOSIS — C19 Malignant neoplasm of rectosigmoid junction: Secondary | ICD-10-CM | POA: Diagnosis present

## 2019-02-19 DIAGNOSIS — K5900 Constipation, unspecified: Secondary | ICD-10-CM | POA: Diagnosis not present

## 2019-02-19 DIAGNOSIS — Z79899 Other long term (current) drug therapy: Secondary | ICD-10-CM | POA: Diagnosis not present

## 2019-02-19 LAB — CBC WITH DIFFERENTIAL (CANCER CENTER ONLY)
Abs Immature Granulocytes: 0.01 10*3/uL (ref 0.00–0.07)
Basophils Absolute: 0 10*3/uL (ref 0.0–0.1)
Basophils Relative: 1 %
Eosinophils Absolute: 0.2 10*3/uL (ref 0.0–0.5)
Eosinophils Relative: 3 %
HCT: 41 % (ref 39.0–52.0)
Hemoglobin: 13.6 g/dL (ref 13.0–17.0)
Immature Granulocytes: 0 %
Lymphocytes Relative: 25 %
Lymphs Abs: 1.5 10*3/uL (ref 0.7–4.0)
MCH: 29.8 pg (ref 26.0–34.0)
MCHC: 33.2 g/dL (ref 30.0–36.0)
MCV: 89.9 fL (ref 80.0–100.0)
Monocytes Absolute: 0.5 10*3/uL (ref 0.1–1.0)
Monocytes Relative: 8 %
Neutro Abs: 3.9 10*3/uL (ref 1.7–7.7)
Neutrophils Relative %: 63 %
Platelet Count: 281 10*3/uL (ref 150–400)
RBC: 4.56 MIL/uL (ref 4.22–5.81)
RDW: 13.2 % (ref 11.5–15.5)
WBC Count: 6 10*3/uL (ref 4.0–10.5)
nRBC: 0 % (ref 0.0–0.2)

## 2019-02-19 LAB — CMP (CANCER CENTER ONLY)
ALT: 24 U/L (ref 0–44)
AST: 18 U/L (ref 15–41)
Albumin: 4.3 g/dL (ref 3.5–5.0)
Alkaline Phosphatase: 69 U/L (ref 38–126)
Anion gap: 9 (ref 5–15)
BUN: 12 mg/dL (ref 6–20)
CO2: 24 mmol/L (ref 22–32)
Calcium: 8.9 mg/dL (ref 8.9–10.3)
Chloride: 107 mmol/L (ref 98–111)
Creatinine: 0.88 mg/dL (ref 0.61–1.24)
GFR, Est AFR Am: >60 mL/min (ref >60)
GFR, Estimated: >60 mL/min (ref >60)
Glucose, Bld: 91 mg/dL (ref 70–99)
Potassium: 4.1 mmol/L (ref 3.5–5.1)
Sodium: 140 mmol/L (ref 135–145)
Total Bilirubin: 0.2 mg/dL — ABNORMAL LOW (ref 0.3–1.2)
Total Protein: 6.8 g/dL (ref 6.5–8.1)

## 2019-02-19 LAB — CEA (IN HOUSE-CHCC): CEA (CHCC-In House): 4.36 ng/mL (ref 0.00–5.00)

## 2019-02-19 MED ORDER — LIDOCAINE-PRILOCAINE 2.5-2.5 % EX CREA: 1.0000 "application " | TOPICAL_CREAM | CUTANEOUS | 5 refills | Status: DC

## 2019-02-19 MED ORDER — PROCHLORPERAZINE MALEATE 10 MG PO TABS: 10.0000 mg | ORAL_TABLET | Freq: Four times a day (QID) | ORAL | 1 refills | Status: DC | PRN

## 2019-02-19 NOTE — Progress Notes (Signed)
Instructed patient about port and port placement procedure and use of EMLA cream. Also provided port model for him to see and touch.

## 2019-02-19 NOTE — Progress Notes (Signed)
START ON PATHWAY REGIMEN - Colorectal     A cycle is every 14 days:     Oxaliplatin      Leucovorin      Fluorouracil      Fluorouracil   **Always confirm dose/schedule in your pharmacy ordering system**  Patient Characteristics: Preoperative or Nonsurgical Candidate (Clinical Staging), Rectal, cT3 - cT4, cN0 or Any cT, cN+ Tumor Location: Rectal Therapeutic Status: Preoperative or Nonsurgical Candidate (Clinical Staging) AJCC T Category: Staged < 8th Ed. AJCC N Category: Staged < 8th Ed. AJCC M Category: Staged < 8th Ed. AJCC 8 Stage Grouping: Staged < 8th Ed. Intent of Therapy: Curative Intent, Discussed with Patient 

## 2019-02-19 NOTE — Progress Notes (Signed)
  [facility] OFFICE PROGRESS NOTE   Diagnosis: Rectal cancer  INTERVAL HISTORY:   Brandon Barry returns as scheduled.  He reports improvement in the constipation and rectal discomfort since starting MiraLAX.  He continues to have intermittent rectal bleeding.  He saw Dr. [name] and Dr. Cloyd Stagers.  He reports they are in agreement with neoadjuvant therapy.  Objective:  Vital signs in last 24 hours:  Blood pressure (!) 141/85, pulse 78, temperature 98.5 F (36.9 C), temperature source Oral, resp. rate 18, height 6' (1.829 m), weight 176 lb 3.2 oz (79.9 kg), SpO2 100 %.   Physical examination not performed today    Imaging: MRI pelvis 02/13/2019-T3c tumor at 11.5 cm from the anal verge, 3 mesorectal lymph nodes greater than 5 mm-N1   Medications: I have reviewed the patient's current medications.   Assessment/Plan:  1. Rectosigmoid cancer   Colonoscopy by Dr. Benson Norway 02/04/2019-fungating infiltrative and ulcerated nonobstructing large mass in the rectum.  The mass was circumferential measuring 3 cm in length.  There was no bleeding present.  The mass was 10 cm from the anal verge.  Biopsy showed invasive adenocarcinoma, moderately differentiated; MLH1, MSH2, MSH6 and PMS2 intact.    CTs abdomen and pelvis 02/04/2019-apparent colorectal mass measuring 3.9 x 5.4 x 3.2 cm involving the distal sigmoid colon and proximal rectum with adjacent borderline enlarged suspicious mesorectal lymph nodes; indeterminate lesion in segment 6 of the liver measuring 1.4 x 1.1 cm.  MRI pelvis 02/13/2019- T3c, N1 tumor at 11.5 cm from the anal verge, 6.7 cm from the internal anal sphincter, 3 lymph nodes greater than 5 mm, largest 8 mm  CT chest 02/13/2019-negative for metastatic disease  MRI liver 02/13/2019- 3.6 cm hemangioma in the posterior right hepatic lobe, 10 mm indeterminate lesion in the posterior right hepatic lobe-potentially an atypical hemangioma- solitary metastasis not excluded 2. Change in  bowel habits/rectal pain secondary to #1   Disposition: Mr. Mcphatter has been diagnosed with clinical stage III rectal cancer.  His case was presented at the GI tumor conference last week.  He has been seen in consultation by colorectal surgery.  The plan is to proceed with total neoadjuvant therapy.  FOLFOX chemotherapy will be given for 8 cycles to be followed by concurrent capecitabine and radiation.  I reviewed potential toxicities associated with the FOLFOX regimen including the chance for nausea/vomiting, mucositis, diarrhea, alopecia, and hematologic toxicity.  We discussed the sun sensitivity, rash, hyperpigmentation, and hand/foot syndrome associated with 5-fluorouracil.  We reviewed the allergic reaction and various types of neuropathy seen with oxaliplatin.  He agrees to proceed.  He will attend a chemotherapy teaching class.  The plan is to begin FOLFOX on 02/27/2019.  He will be referred to Dr. [name] for Port-A-Cath placement.  We will check a baseline CBC, chemistry panel, and CEA today.  His wife was present by telephone for today's visit. 40 minutes were spent with the patient today.  The majority of the time was used for counseling and coordination of care.  Betsy Coder, MD  02/19/2019  10:25 AM

## 2019-02-19 NOTE — Telephone Encounter (Signed)
Appointments have already been made during check out, patient received after visit summary and calender.

## 2019-02-19 NOTE — Telephone Encounter (Signed)
Called and spoke with patient.scheduled per los.  Advised to get printout from scheduling.

## 2019-02-20 ENCOUNTER — Encounter (HOSPITAL_COMMUNITY): Payer: Self-pay

## 2019-02-20 ENCOUNTER — Other Ambulatory Visit (HOSPITAL_COMMUNITY)
Admission: RE | Admit: 2019-02-20 | Discharge: 2019-02-20 | Disposition: A | Discharge status: Home / Self Care | Payer: Commercial Managed Care - PPO | Source: Ambulatory Visit | Attending: Surgery | Admitting: Surgery

## 2019-02-20 ENCOUNTER — Other Ambulatory Visit: Payer: Self-pay

## 2019-02-20 ENCOUNTER — Other Ambulatory Visit (HOSPITAL_COMMUNITY): Payer: Commercial Managed Care - PPO

## 2019-02-20 DIAGNOSIS — Z20828 Contact with and (suspected) exposure to other viral communicable diseases: Secondary | ICD-10-CM | POA: Diagnosis not present

## 2019-02-20 DIAGNOSIS — Z01812 Encounter for preprocedural laboratory examination: Secondary | ICD-10-CM | POA: Diagnosis not present

## 2019-02-20 LAB — SARS CORONAVIRUS 2 (TAT 6-24 HRS): SARS Coronavirus 2: NEGATIVE

## 2019-02-20 NOTE — Progress Notes (Signed)
SPOKE W/  [place]     SCREENING SYMPTOMS OF COVID 19:   COUGH--NO  RUNNY NOSE---NO   SORE THROAT---NO  NASAL CONGESTION----NO  SNEEZING----NO  SHORTNESS OF BREATH---NO  DIFFICULTY BREATHING---NO  TEMP >100.0 -----NO  UNEXPLAINED BODY ACHES------NO  CHILLS -------- NO  HEADACHES ---------NO  LOSS OF SMELL/ TASTE --------NO    HAVE YOU OR ANY FAMILY MEMBER TRAVELLED PAST 14 DAYS OUT OF THE   COUNTY---Lives in [place]----NO COUNTRY----NO  HAVE YOU OR ANY FAMILY MEMBER BEEN EXPOSED TO ANYONE WITH COVID 19? NO

## 2019-02-20 NOTE — Progress Notes (Signed)
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 Patient was also instructed that they will need to review over the PAT instructions again at home before surgery.

## 2019-02-20 NOTE — Anesthesia Preprocedure Evaluation (Addendum)
Anesthesia Evaluation  Patient identified by MRN, date of birth, ID band Patient awake    Reviewed: Allergy & Precautions, NPO status , Patient's Chart, lab work & pertinent test results  Airway Mallampati: II  TM Distance: >3 FB Neck ROM: Full    Dental  (+) Chipped,    Pulmonary sleep apnea and Continuous Positive Airway Pressure Ventilation , former smoker,    Pulmonary exam normal breath sounds clear to auscultation       Cardiovascular negative cardio ROS Normal cardiovascular exam Rhythm:Regular Rate:Normal     Neuro/Psych PSYCHIATRIC DISORDERS Anxiety Depression negative neurological ROS     GI/Hepatic negative GI ROS, Neg liver ROS,   Endo/Other  negative endocrine ROS  Renal/GU negative Renal ROS     Musculoskeletal Chronic back pain   Abdominal   Peds  Hematology HLD   Anesthesia Other Findings RECTAL CANCER  Reproductive/Obstetrics                            Anesthesia Physical Anesthesia Plan  ASA: II  Anesthesia Plan: General   Post-op Pain Management:    Induction: Intravenous  PONV Risk Score and Plan: 2 and Ondansetron, Dexamethasone, Midazolam and Treatment may vary due to age or medical condition  Airway Management Planned: LMA  Additional Equipment:   Intra-op Plan:   Post-operative Plan: Extubation in OR  Informed Consent: I have reviewed the patients History and Physical, chart, labs and discussed the procedure including the risks, benefits and alternatives for the proposed anesthesia with the patient or authorized representative who has indicated his/her understanding and acceptance.     Dental advisory given  Plan Discussed with: CRNA  Anesthesia Plan Comments:        Anesthesia Quick Evaluation

## 2019-02-21 ENCOUNTER — Encounter (HOSPITAL_COMMUNITY)
Admission: RE | Disposition: A | Discharge status: Home / Self Care | Payer: Self-pay | Source: Home / Self Care | Attending: Surgery

## 2019-02-21 ENCOUNTER — Ambulatory Visit (HOSPITAL_COMMUNITY): Payer: Commercial Managed Care - PPO | Admitting: Anesthesiology

## 2019-02-21 ENCOUNTER — Encounter (HOSPITAL_COMMUNITY): Payer: Self-pay

## 2019-02-21 ENCOUNTER — Ambulatory Visit (HOSPITAL_COMMUNITY)
Admission: RE | Admit: 2019-02-21 | Discharge: 2019-02-21 | Disposition: A | Discharge status: Home / Self Care | Payer: Commercial Managed Care - PPO | Attending: Surgery | Admitting: Surgery

## 2019-02-21 ENCOUNTER — Ambulatory Visit (HOSPITAL_COMMUNITY): Admission: RE | Admit: 2019-02-21 | Payer: Commercial Managed Care - PPO | Source: Ambulatory Visit

## 2019-02-21 ENCOUNTER — Ambulatory Visit (HOSPITAL_COMMUNITY): Payer: Commercial Managed Care - PPO

## 2019-02-21 DIAGNOSIS — C20 Malignant neoplasm of rectum: Secondary | ICD-10-CM | POA: Insufficient documentation

## 2019-02-21 DIAGNOSIS — G4733 Obstructive sleep apnea (adult) (pediatric): Secondary | ICD-10-CM | POA: Diagnosis not present

## 2019-02-21 DIAGNOSIS — Z8249 Family history of ischemic heart disease and other diseases of the circulatory system: Secondary | ICD-10-CM | POA: Insufficient documentation

## 2019-02-21 DIAGNOSIS — Z87891 Personal history of nicotine dependence: Secondary | ICD-10-CM | POA: Insufficient documentation

## 2019-02-21 DIAGNOSIS — Z809 Family history of malignant neoplasm, unspecified: Secondary | ICD-10-CM | POA: Insufficient documentation

## 2019-02-21 DIAGNOSIS — G4730 Sleep apnea, unspecified: Secondary | ICD-10-CM | POA: Diagnosis not present

## 2019-02-21 DIAGNOSIS — E785 Hyperlipidemia, unspecified: Secondary | ICD-10-CM | POA: Diagnosis not present

## 2019-02-21 DIAGNOSIS — F329 Major depressive disorder, single episode, unspecified: Secondary | ICD-10-CM | POA: Insufficient documentation

## 2019-02-21 DIAGNOSIS — F419 Anxiety disorder, unspecified: Secondary | ICD-10-CM | POA: Insufficient documentation

## 2019-02-21 DIAGNOSIS — M549 Dorsalgia, unspecified: Secondary | ICD-10-CM | POA: Diagnosis not present

## 2019-02-21 DIAGNOSIS — Z452 Encounter for adjustment and management of vascular access device: Secondary | ICD-10-CM

## 2019-02-21 DIAGNOSIS — G8929 Other chronic pain: Secondary | ICD-10-CM | POA: Insufficient documentation

## 2019-02-21 HISTORY — DX: Malignant neoplasm of rectum: C20

## 2019-02-21 HISTORY — DX: Obstructive sleep apnea (adult) (pediatric): Z99.89

## 2019-02-21 HISTORY — PX: PORTACATH PLACEMENT: SHX2246

## 2019-02-21 HISTORY — DX: Obstructive sleep apnea (adult) (pediatric): G47.33

## 2019-02-21 SURGERY — INSERTION, TUNNELED CENTRAL VENOUS DEVICE, WITH PORT
Anesthesia: General | Site: Chest | Laterality: Left

## 2019-02-21 MED ORDER — PROPOFOL 10 MG/ML IV BOLUS
INTRAVENOUS | Status: AC
  Filled 2019-02-21: qty 20

## 2019-02-21 MED ORDER — CEFAZOLIN SODIUM-DEXTROSE 2-4 GM/100ML-% IV SOLN
2.0000 g | Freq: Once | INTRAVENOUS | Status: AC
  Administered 2019-02-21: 2 g via INTRAVENOUS

## 2019-02-21 MED ORDER — ONDANSETRON HCL 4 MG/2ML IJ SOLN
INTRAMUSCULAR | Status: DC | PRN
  Administered 2019-02-21: 4 mg via INTRAVENOUS

## 2019-02-21 MED ORDER — OXYCODONE HCL 5 MG PO TABS: 5.0000 mg | ORAL_TABLET | Freq: Once | ORAL | Status: DC | PRN

## 2019-02-21 MED ORDER — ACETAMINOPHEN 500 MG PO TABS
1000.0000 mg | ORAL_TABLET | Freq: Once | ORAL | Status: AC
  Administered 2019-02-21: 06:00:00 1000 mg via ORAL

## 2019-02-21 MED ORDER — HYDROMORPHONE HCL 1 MG/ML IJ SOLN
0.2500 mg | INTRAMUSCULAR | Status: DC | PRN
  Administered 2019-02-21 (×2): 0.5 mg via INTRAVENOUS

## 2019-02-21 MED ORDER — LACTATED RINGERS IV SOLN
INTRAVENOUS | Status: DC
  Administered 2019-02-21: 1000 mL via INTRAVENOUS
  Administered 2019-02-21: 06:00:00 via INTRAVENOUS

## 2019-02-21 MED ORDER — HYDROMORPHONE HCL 1 MG/ML IJ SOLN
INTRAMUSCULAR | Status: AC
  Filled 2019-02-21: qty 1

## 2019-02-21 MED ORDER — CEFAZOLIN SODIUM-DEXTROSE 2-4 GM/100ML-% IV SOLN
INTRAVENOUS | Status: AC
  Filled 2019-02-21: qty 100

## 2019-02-21 MED ORDER — OXYCODONE HCL 5 MG PO TABS: 5.0000 mg | ORAL_TABLET | Freq: Four times a day (QID) | ORAL | 0 refills | Status: DC | PRN

## 2019-02-21 MED ORDER — OXYCODONE HCL 5 MG/5ML PO SOLN: 5.0000 mg | Freq: Once | ORAL | Status: DC | PRN

## 2019-02-21 MED ORDER — BUPIVACAINE HCL (PF) 0.25 % IJ SOLN
INTRAMUSCULAR | Status: DC | PRN
  Administered 2019-02-21: 8 mL

## 2019-02-21 MED ORDER — HEPARIN SOD (PORK) LOCK FLUSH 100 UNIT/ML IV SOLN
INTRAVENOUS | Status: DC | PRN
  Administered 2019-02-21: 400 [IU]

## 2019-02-21 MED ORDER — SODIUM CHLORIDE 0.9 % IV SOLN
Freq: Once | INTRAVENOUS | Status: AC
  Administered 2019-02-21: 500 mL
  Filled 2019-02-21: qty 1.2

## 2019-02-21 MED ORDER — FENTANYL CITRATE (PF) 100 MCG/2ML IJ SOLN
INTRAMUSCULAR | Status: DC | PRN
  Administered 2019-02-21 (×2): 50 ug via INTRAVENOUS

## 2019-02-21 MED ORDER — LIDOCAINE 2% (20 MG/ML) 5 ML SYRINGE
INTRAMUSCULAR | Status: DC | PRN
  Administered 2019-02-21: 80 mg via INTRAVENOUS

## 2019-02-21 MED ORDER — HEPARIN SOD (PORK) LOCK FLUSH 100 UNIT/ML IV SOLN
INTRAVENOUS | Status: AC
  Filled 2019-02-21: qty 5

## 2019-02-21 MED ORDER — ONDANSETRON HCL 4 MG/2ML IJ SOLN
INTRAMUSCULAR | Status: AC
  Filled 2019-02-21: qty 2

## 2019-02-21 MED ORDER — BUPIVACAINE HCL (PF) 0.25 % IJ SOLN
INTRAMUSCULAR | Status: AC
  Filled 2019-02-21: qty 30

## 2019-02-21 MED ORDER — DEXAMETHASONE SODIUM PHOSPHATE 10 MG/ML IJ SOLN
INTRAMUSCULAR | Status: DC | PRN
  Administered 2019-02-21: 10 mg via INTRAVENOUS

## 2019-02-21 MED ORDER — MIDAZOLAM HCL 5 MG/5ML IJ SOLN
INTRAMUSCULAR | Status: DC | PRN
  Administered 2019-02-21: 2 mg via INTRAVENOUS

## 2019-02-21 MED ORDER — MIDAZOLAM HCL 2 MG/2ML IJ SOLN
INTRAMUSCULAR | Status: AC
  Filled 2019-02-21: qty 2

## 2019-02-21 MED ORDER — DEXAMETHASONE SODIUM PHOSPHATE 10 MG/ML IJ SOLN
INTRAMUSCULAR | Status: AC
  Filled 2019-02-21: qty 1

## 2019-02-21 MED ORDER — PROPOFOL 10 MG/ML IV BOLUS
INTRAVENOUS | Status: DC | PRN
  Administered 2019-02-21: 200 mg via INTRAVENOUS

## 2019-02-21 MED ORDER — PROMETHAZINE HCL 25 MG/ML IJ SOLN: 6.2500 mg | INTRAMUSCULAR | Status: DC | PRN

## 2019-02-21 MED ORDER — ACETAMINOPHEN 500 MG PO TABS
ORAL_TABLET | ORAL | Status: AC
  Administered 2019-02-21: 1000 mg via ORAL
  Filled 2019-02-21: qty 2

## 2019-02-21 MED ORDER — EPHEDRINE SULFATE-NACL 50-0.9 MG/10ML-% IV SOSY
PREFILLED_SYRINGE | INTRAVENOUS | Status: DC | PRN
  Administered 2019-02-21: 5 mg via INTRAVENOUS

## 2019-02-21 MED ORDER — EPHEDRINE 5 MG/ML INJ
INTRAVENOUS | Status: AC
  Filled 2019-02-21: qty 10

## 2019-02-21 MED ORDER — LIDOCAINE 2% (20 MG/ML) 5 ML SYRINGE
INTRAMUSCULAR | Status: AC
  Filled 2019-02-21: qty 5

## 2019-02-21 MED ORDER — KETOROLAC TROMETHAMINE 30 MG/ML IJ SOLN: 30.0000 mg | Freq: Once | INTRAMUSCULAR | Status: DC | PRN

## 2019-02-21 MED ORDER — FENTANYL CITRATE (PF) 100 MCG/2ML IJ SOLN
INTRAMUSCULAR | Status: AC
  Filled 2019-02-21: qty 2

## 2019-02-21 SURGICAL SUPPLY — 39 items
BAG DECANTER FOR FLEXI CONT (MISCELLANEOUS) ×2 IMPLANT
BENZOIN TINCTURE PRP APPL 2/3 (GAUZE/BANDAGES/DRESSINGS) IMPLANT
BLADE HEX COATED 2.75 (ELECTRODE) ×2 IMPLANT
BLADE SURG 15 STRL LF DISP TIS (BLADE) ×1 IMPLANT
BLADE SURG 15 STRL SS (BLADE) ×1
COVER SURGICAL LIGHT HANDLE (MISCELLANEOUS) ×2 IMPLANT
COVER WAND RF STERILE (DRAPES) IMPLANT
DECANTER SPIKE VIAL GLASS SM (MISCELLANEOUS) ×2 IMPLANT
DERMABOND ADVANCED (GAUZE/BANDAGES/DRESSINGS) ×1
DERMABOND ADVANCED .7 DNX12 (GAUZE/BANDAGES/DRESSINGS) ×1 IMPLANT
DRAPE C-ARM 42X120 X-RAY (DRAPES) ×2 IMPLANT
DRAPE LAPAROTOMY T 98X78 PEDS (DRAPES) ×2 IMPLANT
ELECT PENCIL ROCKER SW 15FT (MISCELLANEOUS) ×2 IMPLANT
ELECT REM PT RETURN 15FT ADLT (MISCELLANEOUS) ×2 IMPLANT
GAUZE 4X4 16PLY RFD (DISPOSABLE) ×2 IMPLANT
GAUZE SPONGE 2X2 8PLY STRL LF (GAUZE/BANDAGES/DRESSINGS) IMPLANT
GAUZE SPONGE 4X4 12PLY STRL (GAUZE/BANDAGES/DRESSINGS) IMPLANT
GLOVE BIOGEL M 8.0 STRL (GLOVE) ×2 IMPLANT
GLOVE BIOGEL PI IND STRL 7.0 (GLOVE) ×1 IMPLANT
GLOVE BIOGEL PI INDICATOR 7.0 (GLOVE) ×1
GOWN SPEC L4 XLG W/TWL (GOWN DISPOSABLE) ×2 IMPLANT
GOWN STRL REUS W/TWL XL LVL3 (GOWN DISPOSABLE) ×6 IMPLANT
KIT BASIN OR (CUSTOM PROCEDURE TRAY) ×2 IMPLANT
KIT PORT POWER 8FR ISP CVUE (Port) ×2 IMPLANT
KIT TURNOVER KIT A (KITS) IMPLANT
NEEDLE HYPO 22GX1.5 SAFETY (NEEDLE) ×2 IMPLANT
NS IRRIG 1000ML POUR BTL (IV SOLUTION) ×2 IMPLANT
PACK BASIC VI WITH GOWN DISP (CUSTOM PROCEDURE TRAY) ×2 IMPLANT
SPONGE GAUZE 2X2 STER 10/PKG (GAUZE/BANDAGES/DRESSINGS)
STRIP CLOSURE SKIN 1/2X4 (GAUZE/BANDAGES/DRESSINGS) IMPLANT
SUT PROLENE 2 0 CT2 30 (SUTURE) ×4 IMPLANT
SUT PROLENE 2 0 SH DA (SUTURE) IMPLANT
SUT VIC AB 4-0 SH 18 (SUTURE) ×2 IMPLANT
SYR 10ML ECCENTRIC (SYRINGE) ×2 IMPLANT
SYR 10ML LL (SYRINGE) ×2 IMPLANT
SYR BULB IRRIGATION 50ML (SYRINGE) IMPLANT
SYR CONTROL 10ML LL (SYRINGE) ×2 IMPLANT
TOWEL OR 17X26 10 PK STRL BLUE (TOWEL DISPOSABLE) ×2 IMPLANT
TOWEL OR NON WOVEN STRL DISP B (DISPOSABLE) ×2 IMPLANT

## 2019-02-21 NOTE — Discharge Instructions (Signed)
Implanted Port Insertion Implanted port insertion is a procedure to put in a port and catheter. The port is a device with an injectable disk that can be accessed by your health care provider. The port is connected to a vein in the chest or neck by a small flexible tube (catheter). There are different types of ports. The implanted port may be used as a long-term IV access for:  Medicines, such as chemotherapy.  Fluids.  Liquid nutrition, such as total parenteral nutrition (TPN). When you have a port, this means that your health care provider will not need to use the veins in your arms for these procedures. Tell a health care provider about:  Any allergies you have.  All medicines you are taking, especially blood thinners, as well as any vitamins, herbs, eye drops, creams, over-the-counter medicines, and steroids.  Any problems you or family members have had with anesthetic medicines.  Any blood disorders you have.  Any surgeries you have had.  Any medical conditions you have or have had, including diabetes or kidney problems.  Whether you are pregnant or may be pregnant. What are the risks? Generally, this is a safe procedure. However, problems may occur, including:  Allergic reactions to medicines or dyes.  Damage to other structures or organs.  Infection.  Damage to the blood vessel, bruising, or bleeding at the puncture site.  Blood clot.  Breakdown of the skin over the port.  A collection of air in the chest that can cause one of the lungs to collapse (pneumothorax). This is rare. What happens before the procedure? Medicines  Ask your health care provider about: ? Changing or stopping your regular medicines. This is especially important if you are taking diabetes medicines or blood thinners. ? Taking medicines such as aspirin and ibuprofen. These medicines can thin your blood. Do not take these medicines unless your health care provider tells you to take  them. ? Taking over-the-counter medicines, vitamins, herbs, and supplements. Staying hydrated Follow instructions from your health care provider about hydration, which may include:  Up to 2 hours before the procedure - you may continue to drink clear liquids, such as water, clear fruit juice, black coffee, and plain tea.  Eating and drinking restrictions  Follow instructions from your health care provider about eating and drinking, which may include: ? 8 hours before the procedure - stop eating heavy meals or foods, such as meat, fried foods, or fatty foods. ? 6 hours before the procedure - stop eating light meals or foods, such as toast or cereal. ? 6 hours before the procedure - stop drinking milk or drinks that contain milk. ? 2 hours before the procedure - stop drinking clear liquids. General instructions  Plan to have someone take you home from the hospital or clinic.  If you will be going home right after the procedure, plan to have someone with you for 24 hours.  You may have blood tests.  Do not use any products that contain nicotine or tobacco for at least 4-6 weeks before the procedure. These products include cigarettes, e-cigarettes, and chewing tobacco. If you need help quitting, ask your health care provider.  Ask your health care provider what steps will be taken to help prevent infection. These may include: ? Removing hair at the surgery site. ? Washing skin with a germ-killing soap. ? Taking antibiotic medicine. What happens during the procedure?   An IV will be inserted into one of your veins.  You will be given  one or more of the following: ? A medicine to help you relax (sedative). ? A medicine to numb the area (local anesthetic).  Two small incisions will be made to insert the port. ? One smaller incision will be made in your neck to get access to the vein where the catheter will lie. ? The other incision will be made in the upper chest. This is where the  port will lie.  The procedure may be done using continuous X-ray (fluoroscopy) or other imaging tools for guidance.  The port and catheter will be placed. There may be a small, raised area where the port is.  The port will be flushed with a salt solution (saline), and blood will be drawn to make sure that it is working correctly.  The incisions will be closed.  Bandages (dressings) may be placed over the incisions. The procedure may vary among health care providers and hospitals. What happens after the procedure?  Your blood pressure, heart rate, breathing rate, and blood oxygen level will be monitored until you leave the hospital or clinic.  Do not drive for 24 hours if you were given a sedative during your procedure.  You will be given a manufacturer's information card for the type of port that you have. Keep this with you.  Your port will need to be flushed and checked as told by your health care provider, usually every few weeks.  A chest X-ray will be done to: ? Check the placement of the port. ? Make sure there is no injury to your lung. Summary  Implanted port insertion is a procedure to put in a port and catheter.  The implanted port is used as a long-term IV access.  The port will need to be flushed and checked as told by your health care provider, usually every few weeks.  Keep your manufacturer's information card with you at all times. This information is not intended to replace advice given to you by your health care provider. Make sure you discuss any questions you have with your health care provider. Document Released: 03/19/2013 Document Revised: 09/20/2018 Document Reviewed: 12/25/2017 Elsevier Patient Education  [place] Insertion, Care After This sheet gives you information about how to care for yourself after your procedure. Your health care provider may also give you more specific instructions. If you have problems or  questions, contact your health care provider. What can I expect after the procedure? After the procedure, it is common to have: Discomfort at the port insertion site. Bruising on the skin over the port. This should improve over 3-4 days. Follow these instructions at home: [facility] care After your port is placed, you will get a manufacturer's information card. The card has information about your port. Keep this card with you at all times. Take care of the port as told by your health care provider. Ask your health care provider if you or a family member can get training for taking care of the port at home. A home health care nurse may also take care of the port. Make sure to remember what type of port you have. Incision care     Follow instructions from your health care provider about how to take care of your port insertion site. Make sure you: Wash your hands with soap and water before and after you change your bandage (dressing). If soap and water are not available, use hand sanitizer. Change your dressing as told by your health care  provider. Leave stitches (sutures), skin glue, or adhesive strips in place. These skin closures may need to stay in place for 2 weeks or longer. If adhesive strip edges start to loosen and curl up, you may trim the loose edges. Do not remove adhesive strips completely unless your health care provider tells you to do that. Check your port insertion site every day for signs of infection. Check for: Redness, swelling, or pain. Fluid or blood. Warmth. Pus or a bad smell. Activity Return to your normal activities as told by your health care provider. Ask your health care provider what activities are safe for you. Do not lift anything that is heavier than 10 lb (4.5 kg), or the limit that you are told, until your health care provider says that it is safe. General instructions Take over-the-counter and prescription medicines only as told by your health care provider. Do  not take baths, swim, or use a hot tub until your health care provider approves. Ask your health care provider if you may take showers. You may only be allowed to take sponge baths. Do not drive for 24 hours if you were given a sedative during your procedure. Wear a medical alert bracelet in case of an emergency. This will tell any health care providers that you have a port. Keep all follow-up visits as told by your health care provider. This is important. Contact a health care provider if: You cannot flush your port with saline as directed, or you cannot draw blood from the port. You have a fever or chills. You have redness, swelling, or pain around your port insertion site. You have fluid or blood coming from your port insertion site. Your port insertion site feels warm to the touch. You have pus or a bad smell coming from the port insertion site. Get help right away if: You have chest pain or shortness of breath. You have bleeding from your port that you cannot control. Summary Take care of the port as told by your health care provider. Keep the manufacturer's information card with you at all times. Change your dressing as told by your health care provider. Contact a health care provider if you have a fever or chills or if you have redness, swelling, or pain around your port insertion site. Keep all follow-up visits as told by your health care provider. This information is not intended to replace advice given to you by your health care provider. Make sure you discuss any questions you have with your health care provider. Document Released: 03/19/2013 Document Revised: 12/25/2017 Document Reviewed: 12/25/2017 Elsevier Patient Education  [place] An implanted port is a device that is placed under the skin. It is usually placed in the chest. The device can be used to give IV medicine, to take blood, or for dialysis. You may have an implanted port  if:  You need IV medicine that would be irritating to the small veins in your hands or arms.  You need IV medicines, such as antibiotics, for a long period of time.  You need IV nutrition for a long period of time.  You need dialysis. Having a port means that your health care provider will not need to use the veins in your arms for these procedures. You may have fewer limitations when using a port than you would if you used other types of long-term IVs, and you will likely be able to return to normal activities after your incision heals. An  implanted port has two main parts:  Reservoir. The reservoir is the part where a needle is inserted to give medicines or draw blood. The reservoir is round. After it is placed, it appears as a small, raised area under your skin.  Catheter. The catheter is a thin, flexible tube that connects the reservoir to a vein. Medicine that is inserted into the reservoir goes into the catheter and then into the vein. How is my port accessed? To access your port:  A numbing cream may be placed on the skin over the port site.  Your health care provider will put on a mask and sterile gloves.  The skin over your port will be cleaned carefully with a germ-killing soap and allowed to dry.  Your health care provider will gently pinch the port and insert a needle into it.  Your health care provider will check for a blood return to make sure the port is in the vein and is not clogged.  If your port needs to remain accessed to get medicine continuously (constant infusion), your health care provider will place a clear bandage (dressing) over the needle site. The dressing and needle will need to be changed every week, or as told by your health care provider. What is flushing? Flushing helps keep the port from getting clogged. Follow instructions from your health care provider about how and when to flush the port. Ports are usually flushed with saline solution or a medicine  called heparin. The need for flushing will depend on how the port is used:  If the port is only used from time to time to give medicines or draw blood, the port may need to be flushed: ? Before and after medicines have been given. ? Before and after blood has been drawn. ? As part of routine maintenance. Flushing may be recommended every 4-6 weeks.  If a constant infusion is running, the port may not need to be flushed.  Throw away any syringes in a disposal container that is meant for sharp items (sharps container). You can buy a sharps container from a pharmacy, or you can make one by using an empty hard plastic bottle with a cover. How long will my port stay implanted? The port can stay in for as long as your health care provider thinks it is needed. When it is time for the port to come out, a surgery will be done to remove it. The surgery will be similar to the procedure that was done to put the port in. Follow these instructions at home:   Flush your port as told by your health care provider.  If you need an infusion over several days, follow instructions from your health care provider about how to take care of your port site. Make sure you: ? Wash your hands with soap and water before you change your dressing. If soap and water are not available, use alcohol-based hand sanitizer. ? Change your dressing as told by your health care provider. ? Place any used dressings or infusion bags into a plastic bag. Throw that bag in the trash. ? Keep the dressing that covers the needle clean and dry. Do not get it wet. ? Do not use scissors or sharp objects near the tube. ? Keep the tube clamped, unless it is being used.  Check your port site every day for signs of infection. Check for: ? Redness, swelling, or pain. ? Fluid or blood. ? Pus or a bad smell.  Protect the skin  around the port site. ? Avoid wearing bra straps that rub or irritate the site. ? Protect the skin around your port from  seat belts. Place a soft pad over your chest if needed.  Bathe or shower as told by your health care provider. The site may get wet as long as you are not actively receiving an infusion.  Return to your normal activities as told by your health care provider. Ask your health care provider what activities are safe for you.  Carry a medical alert card or wear a medical alert bracelet at all times. This will let health care providers know that you have an implanted port in case of an emergency. Get help right away if:  You have redness, swelling, or pain at the port site.  You have fluid or blood coming from your port site.  You have pus or a bad smell coming from the port site.  You have a fever. Summary  Implanted ports are usually placed in the chest for long-term IV access.  Follow instructions from your health care provider about flushing the port and changing bandages (dressings).  Take care of the area around your port by avoiding clothing that puts pressure on the area, and by watching for signs of infection.  Protect the skin around your port from seat belts. Place a soft pad over your chest if needed.  Get help right away if you have a fever or you have redness, swelling, pain, drainage, or a bad smell at the port site. This information is not intended to replace advice given to you by your health care provider. Make sure you discuss any questions you have with your health care provider. Document Released: 05/29/2005 Document Revised: 09/20/2018 Document Reviewed: 07/01/2016 Elsevier Patient Education  2020 [employer].

## 2019-02-21 NOTE — Anesthesia Procedure Notes (Signed)
Procedure Name: LMA Insertion Date/Time: 02/21/2019 7:38 AM Performed by: [place] Tailor Lucking C, CRNA Pre-anesthesia Checklist: Patient identified, Emergency Drugs available, Suction available and Patient being monitored Patient Re-evaluated:Patient Re-evaluated prior to induction Oxygen Delivery Method: Circle system utilized Preoxygenation: Pre-oxygenation with 100% oxygen Induction Type: IV induction Ventilation: Mask ventilation without difficulty LMA: LMA inserted LMA Size: 4.0 Number of attempts: 1 Airway Equipment and Method: Bite block Placement Confirmation: positive ETCO2 Tube secured with: Tape Dental Injury: Teeth and Oropharynx as per pre-operative assessment

## 2019-02-21 NOTE — H&P (Signed)
Chief Complaint:  Needs IV acess for chemotherapy  History of Present Illness:  Brandon Barry is an 43 y.o. male who was recently diagnosed with rectal cancer.  He needs IV access and he is to have a portacath placed.  I saw him in the presurgery area and obtained informed consent.  Past Medical History:  Diagnosis Date  . Chronic back pain    No current issues 02/20/2019  . Hyperlipidemia   . OSA on CPAP   . Rectal adenocarcinoma ([place])   . Right shoulder pain     Past Surgical History:  Procedure Laterality Date  . COLONOSCOPY  02/04/2019  . KNEE ARTHROSCOPY Left 11/19/2018  . WISDOM TOOTH EXTRACTION      Current Facility-Administered Medications  Medication Dose Route Frequency Provider Last Rate Last Dose  . ceFAZolin (ANCEF) 2-4 GM/100ML-% IVPB           . ceFAZolin (ANCEF) IVPB 2g/100 mL premix  2 g Intravenous Once Johnathan Hausen, MD      . heparin 6,000 Units in sodium chloride 0.9 % 500 mL irrigation   Irrigation Once Johnathan Hausen, MD      . lactated ringers infusion   Intravenous Continuous Johnathan Hausen, MD 10 mL/hr at 02/21/19 [phone]     Patient has no known allergies. Family History  Problem Relation Age of Onset  . Heart attack Father   . Cancer Paternal Grandmother   . Cancer Paternal Grandfather    Social History:   reports that he has quit smoking. His smoking use included cigarettes. He has never used smokeless tobacco. He reports current alcohol use. He reports that he does not use drugs.   REVIEW OF SYSTEMS : Negative except for see problem listl  Physical Exam:   Blood pressure (!) 150/114, pulse (!) 56, temperature 98.3 F (36.8 C), temperature source Oral, resp. rate 15, height 6' (1.829 m), weight 78 kg, SpO2 100 %. Body mass index is 23.33 kg/m.  Gen:  WDWN WM NAD  Neurological: Alert and oriented to person, place, and time. Motor and sensory function is grossly intact  Head: Normocephalic and atraumatic.  Eyes: Conjunctivae are normal.  Pupils are equal, round, and reactive to light. No scleral icterus.  Neck: Normal range of motion. Neck supple. No tracheal deviation or thyromegaly present.  Cardiovascular:  SR without murmurs or gallops.  No carotid bruits Breast:  Not examined Respiratory: Effort normal.  No respiratory distress. No chest wall tenderness. Breath sounds normal.  No wheezes, rales or rhonchi.  Abdomen:  nontender GU:  Not examined Musculoskeletal: Normal range of motion. Extremities are nontender. No cyanosis, edema or clubbing noted Lymphadenopathy: No cervical, preauricular, postauricular or axillary adenopathy is present Skin: Skin is warm and dry. No rash noted. No diaphoresis. No erythema. No pallor. Pscyh: Normal mood and affect. Behavior is normal. Judgment and thought content normal.   LABORATORY RESULTS: Results for orders placed or performed during the hospital encounter of 02/20/19 (from the past 48 hour(s))  SARS CORONAVIRUS 2 (TAT 6-24 HRS) Nasopharyngeal Nasopharyngeal Swab     Status: None   Collection Time: 02/20/19  9:02 AM   Specimen: Nasopharyngeal Swab  Result Value Ref Range   SARS Coronavirus 2 NEGATIVE NEGATIVE    Comment: (NOTE) SARS-CoV-2 target nucleic acids are NOT DETECTED. The SARS-CoV-2 RNA is generally detectable in upper and lower respiratory specimens during the acute phase of infection. Negative results do not preclude SARS-CoV-2 infection, do not rule out co-infections with other  pathogens, and should not be used as the sole basis for treatment or other patient management decisions. Negative results must be combined with clinical observations, patient history, and epidemiological information. The expected result is Negative. Fact Sheet for Patients: SugarRoll.be Fact Sheet for Healthcare Providers: https://www.woods-mathews.com/ This test is not yet approved or cleared by the [place] FDA and  has been authorized for  detection and/or diagnosis of SARS-CoV-2 by FDA under an Emergency Use Authorization (EUA). This EUA will remain  in effect (meaning this test can be used) for the duration of the COVID-19 declaration under Section 56 4(b)(1) of the Act, 21 U.S.C. section 360bbb-3(b)(1), unless the authorization is terminated or revoked sooner. Performed at [facility], [place] [address] [place], [place] [address]      RADIOLOGY RESULTS: No results found.  Problem List: Patient Active Problem List   Diagnosis Date Noted  . Cancer of rectum ([place]) 02/19/2019  . Malignant neoplasm of rectum ([place]) 02/11/2019  . ANXIETY 03/18/2007  . DEPRESSION 03/18/2007  . ALLERGIC RHINITIS 03/18/2007    Assessment & Plan: Rectal cancer for IV access    Matt B. Hassell Done, MD, [facility] [phone] beeper [phone]  02/21/2019 7:16 AM

## 2019-02-21 NOTE — Op Note (Signed)
Nydia Bouton III  07/16/1975 11 Sept 2021    PCP:  [facility], [facility]   Surgeon: Kaylyn Lim, MD, FACS  Asst:  none  Anes:  General by LMA  Preop Dx: Rectal cancer for portacath Postop Dx: same  Procedure: Left subclavian portacath placement Location Surgery: [facility] 1 Complications: None noted  EBL:   minimal cc  Drains: none  Description of Procedure:  The patient was taken to OR 1 .  After anesthesia was administered and the patient was prepped  with [employer] and a timeout was performed.  He was place in [place] and the left subclavian vein was entered and the wire passed.  Position in the SVC confirmed with C arm.  Pocket made and catheter passed to wire site.  Introducer placed and the flushed catheter was passed without difficulty to the SVC atrium junction.  It was flushed and connected to the port which was anchored with a single prolene.  It irrigated and flushed easily and was flushed with concentrated heparin.  The area was infiltrated with lidocaine and closed with 4-0 vicryl and Dermabond.    The patient tolerated the procedure well and was taken to the PACU in stable condition.     Matt B. Hassell Done, [place], [facility], [place]

## 2019-02-21 NOTE — Transfer of Care (Signed)
Immediate Anesthesia Transfer of Care Note  Patient: Brandon Barry  Procedure(s) Performed: INSERTION PORT-A-CATH (Left Chest)  Patient Location: PACU  Anesthesia Type:General  Level of Consciousness: awake, alert  and oriented  Airway & Oxygen Therapy: Patient Spontanous Breathing and Patient connected to face mask oxygen  Post-op Assessment: Report given to RN and Post -op Vital signs reviewed and stable  Post vital signs: Reviewed and stable  Last Vitals:  Vitals Value Taken Time  BP 138/93 02/21/19 0838  Temp    Pulse 61 02/21/19 0840  Resp 13 02/21/19 0840  SpO2 100 % 02/21/19 0840  Vitals shown include unvalidated device data.  Last Pain:  Vitals:   02/21/19 0604  TempSrc:   PainSc: 0-No pain         Complications: No apparent anesthesia complications

## 2019-02-21 NOTE — Interval H&P Note (Signed)
History and Physical Interval Note:  02/21/2019 7:18 AM  Brandon Barry  has presented today for surgery, with the diagnosis of RECTAL CANCER.  The various methods of treatment have been discussed with the patient and family. After consideration of risks, benefits and other options for treatment, the patient has consented to  Procedure(s): INSERTION PORT-A-CATH WITH ULTRASOUND GUIDANCE (N/A) as a surgical intervention.  The patient's history has been reviewed, patient examined, no change in status, stable for surgery.  I have reviewed the patient's chart and labs.  Questions were answered to the patient's satisfaction.     Pedro Earls

## 2019-02-21 NOTE — Anesthesia Postprocedure Evaluation (Signed)
Anesthesia Post Note  Patient: Brandon Barry  Procedure(s) Performed: INSERTION PORT-A-CATH (Left Chest)     Patient location during evaluation: PACU Anesthesia Type: General Level of consciousness: awake and alert Pain management: pain level controlled Vital Signs Assessment: post-procedure vital signs reviewed and stable Respiratory status: spontaneous breathing, nonlabored ventilation, respiratory function stable and patient connected to nasal cannula oxygen Cardiovascular status: blood pressure returned to baseline and stable Postop Assessment: no apparent nausea or vomiting Anesthetic complications: no    Last Vitals:  Vitals:   02/21/19 1015 02/21/19 1030  BP: (!) 138/102 (!) 138/99  Pulse: (!) 58   Resp:    Temp: (!) 36.4 C   SpO2: 100%     Last Pain:  Vitals:   02/21/19 1015  TempSrc:   PainSc: 2                  Kerith Sherley P Myrene Bougher

## 2019-02-23 ENCOUNTER — Other Ambulatory Visit: Payer: Self-pay | Admitting: Oncology

## 2019-02-24 ENCOUNTER — Encounter: Payer: Self-pay | Admitting: *Deleted

## 2019-02-24 ENCOUNTER — Encounter (HOSPITAL_COMMUNITY): Payer: Self-pay | Admitting: Surgery

## 2019-02-24 NOTE — Progress Notes (Signed)
Notification by managed care that chemo has not yet been approved by insurance carrier. Orders placed by MD. Spoke with Glade Lloyd, PharmD and he approved to proceed w/treatment tomorrow, since FOLFOX is standard of care for his cancer.

## 2019-02-25 ENCOUNTER — Other Ambulatory Visit: Payer: Self-pay

## 2019-02-25 ENCOUNTER — Inpatient Hospital Stay: Payer: Commercial Managed Care - PPO

## 2019-02-25 ENCOUNTER — Other Ambulatory Visit: Payer: Self-pay | Admitting: *Deleted

## 2019-02-25 DIAGNOSIS — C20 Malignant neoplasm of rectum: Secondary | ICD-10-CM

## 2019-02-26 ENCOUNTER — Other Ambulatory Visit: Payer: Self-pay

## 2019-02-26 ENCOUNTER — Inpatient Hospital Stay: Payer: Commercial Managed Care - PPO

## 2019-02-26 VITALS — BP 144/97 | HR 61 | Temp 98.5°F | Resp 17

## 2019-02-26 DIAGNOSIS — C20 Malignant neoplasm of rectum: Secondary | ICD-10-CM

## 2019-02-26 DIAGNOSIS — Z5111 Encounter for antineoplastic chemotherapy: Secondary | ICD-10-CM | POA: Diagnosis not present

## 2019-02-26 DIAGNOSIS — Z95828 Presence of other vascular implants and grafts: Secondary | ICD-10-CM

## 2019-02-26 LAB — CBC WITH DIFFERENTIAL (CANCER CENTER ONLY)
Abs Immature Granulocytes: 0.02 10*3/uL (ref 0.00–0.07)
Basophils Absolute: 0 10*3/uL (ref 0.0–0.1)
Basophils Relative: 0 %
Eosinophils Absolute: 0.2 10*3/uL (ref 0.0–0.5)
Eosinophils Relative: 2 %
HCT: 44.2 % (ref 39.0–52.0)
Hemoglobin: 14.6 g/dL (ref 13.0–17.0)
Immature Granulocytes: 0 %
Lymphocytes Relative: 18 %
Lymphs Abs: 1.5 10*3/uL (ref 0.7–4.0)
MCH: 29.7 pg (ref 26.0–34.0)
MCHC: 33 g/dL (ref 30.0–36.0)
MCV: 89.8 fL (ref 80.0–100.0)
Monocytes Absolute: 0.6 10*3/uL (ref 0.1–1.0)
Monocytes Relative: 8 %
Neutro Abs: 5.7 10*3/uL (ref 1.7–7.7)
Neutrophils Relative %: 72 %
Platelet Count: 274 10*3/uL (ref 150–400)
RBC: 4.92 MIL/uL (ref 4.22–5.81)
RDW: 13.2 % (ref 11.5–15.5)
WBC Count: 8 10*3/uL (ref 4.0–10.5)
nRBC: 0 % (ref 0.0–0.2)

## 2019-02-26 LAB — CMP (CANCER CENTER ONLY)
ALT: 36 U/L (ref 0–44)
AST: 21 U/L (ref 15–41)
Albumin: 4.1 g/dL (ref 3.5–5.0)
Alkaline Phosphatase: 80 U/L (ref 38–126)
Anion gap: 8 (ref 5–15)
BUN: 13 mg/dL (ref 6–20)
CO2: 26 mmol/L (ref 22–32)
Calcium: 9.3 mg/dL (ref 8.9–10.3)
Chloride: 105 mmol/L (ref 98–111)
Creatinine: 0.94 mg/dL (ref 0.61–1.24)
GFR, Est AFR Am: >60 mL/min (ref >60)
GFR, Estimated: >60 mL/min (ref >60)
Glucose, Bld: 139 mg/dL — ABNORMAL HIGH (ref 70–99)
Potassium: 4.1 mmol/L (ref 3.5–5.1)
Sodium: 139 mmol/L (ref 135–145)
Total Bilirubin: 0.5 mg/dL (ref 0.3–1.2)
Total Protein: 6.9 g/dL (ref 6.5–8.1)

## 2019-02-26 MED ORDER — PALONOSETRON HCL INJECTION 0.25 MG/5ML
INTRAVENOUS | Status: AC
  Filled 2019-02-26: qty 5

## 2019-02-26 MED ORDER — OXALIPLATIN CHEMO INJECTION 100 MG/20ML
85.0000 mg/m2 | Freq: Once | INTRAVENOUS | Status: AC
  Administered 2019-02-26: 170 mg via INTRAVENOUS
  Filled 2019-02-26: qty 34

## 2019-02-26 MED ORDER — FLUOROURACIL CHEMO INJECTION 2.5 GM/50ML
400.0000 mg/m2 | Freq: Once | INTRAVENOUS | Status: AC
  Administered 2019-02-26: 800 mg via INTRAVENOUS
  Filled 2019-02-26: qty 16

## 2019-02-26 MED ORDER — SODIUM CHLORIDE 0.9% FLUSH
10.0000 mL | INTRAVENOUS | Status: DC | PRN
  Administered 2019-02-26: 10 mL via INTRAVENOUS
  Filled 2019-02-26: qty 10

## 2019-02-26 MED ORDER — DEXTROSE 5 % IV SOLN
Freq: Once | INTRAVENOUS | Status: AC
  Administered 2019-02-26: 13:00:00 via INTRAVENOUS
  Filled 2019-02-26: qty 250

## 2019-02-26 MED ORDER — PALONOSETRON HCL INJECTION 0.25 MG/5ML
0.2500 mg | Freq: Once | INTRAVENOUS | Status: AC
  Administered 2019-02-26: 0.25 mg via INTRAVENOUS

## 2019-02-26 MED ORDER — DEXAMETHASONE SODIUM PHOSPHATE 10 MG/ML IJ SOLN
INTRAMUSCULAR | Status: AC
  Filled 2019-02-26: qty 1

## 2019-02-26 MED ORDER — LEUCOVORIN CALCIUM INJECTION 350 MG
400.0000 mg/m2 | Freq: Once | INTRAVENOUS | Status: AC
  Administered 2019-02-26: 804 mg via INTRAVENOUS
  Filled 2019-02-26: qty 40.2

## 2019-02-26 MED ORDER — DEXTROSE 5 % IV SOLN
Freq: Once | INTRAVENOUS | Status: AC
  Administered 2019-02-26: 12:00:00 via INTRAVENOUS
  Filled 2019-02-26: qty 250

## 2019-02-26 MED ORDER — SODIUM CHLORIDE 0.9 % IV SOLN
2400.0000 mg/m2 | INTRAVENOUS | Status: DC
  Administered 2019-02-26: 4800 mg via INTRAVENOUS
  Filled 2019-02-26: qty 96

## 2019-02-26 MED ORDER — HEPARIN SOD (PORK) LOCK FLUSH 100 UNIT/ML IV SOLN
500.0000 [IU] | Freq: Once | INTRAVENOUS | Status: DC
  Filled 2019-02-26: qty 5

## 2019-02-26 MED ORDER — HEPARIN SOD (PORK) LOCK FLUSH 100 UNIT/ML IV SOLN
500.0000 [IU] | Freq: Once | INTRAVENOUS | Status: DC | PRN
  Filled 2019-02-26: qty 5

## 2019-02-26 MED ORDER — SODIUM CHLORIDE 0.9% FLUSH
10.0000 mL | INTRAVENOUS | Status: DC | PRN
  Filled 2019-02-26: qty 10

## 2019-02-26 MED ORDER — DEXAMETHASONE SODIUM PHOSPHATE 10 MG/ML IJ SOLN
10.0000 mg | Freq: Once | INTRAMUSCULAR | Status: AC
  Administered 2019-02-26: 10 mg via INTRAVENOUS

## 2019-02-26 NOTE — Patient Instructions (Signed)
[facility] Discharge Instructions for Patients Receiving Chemotherapy  Today you received the following chemotherapy agents: Oxaliplatin, Leucovorin, Fluorouracil   To help prevent nausea and vomiting after your treatment, we encourage you to take your nausea medication as directed.    If you develop nausea and vomiting that is not controlled by your nausea medication, call the clinic.   BELOW ARE SYMPTOMS THAT SHOULD BE REPORTED IMMEDIATELY:  *FEVER GREATER THAN 100.5 F  *CHILLS WITH OR WITHOUT FEVER  NAUSEA AND VOMITING THAT IS NOT CONTROLLED WITH YOUR NAUSEA MEDICATION  *UNUSUAL SHORTNESS OF BREATH  *UNUSUAL BRUISING OR BLEEDING  TENDERNESS IN MOUTH AND THROAT WITH OR WITHOUT PRESENCE OF ULCERS  *URINARY PROBLEMS  *BOWEL PROBLEMS  UNUSUAL RASH Items with * indicate a potential emergency and should be followed up as soon as possible.  Feel free to call the clinic should you have any questions or concerns. The clinic phone number is [phone].  Please show the [facility] at check-in to the Emergency Department and triage nurse.  Oxaliplatin Injection What is this medicine? OXALIPLATIN (ox AL i PLA tin) is a chemotherapy drug. It targets fast dividing cells, like cancer cells, and causes these cells to die. This medicine is used to treat cancers of the colon and rectum, and many other cancers. This medicine may be used for other purposes; ask your health care provider or pharmacist if you have questions. COMMON BRAND NAME(S): Eloxatin What should I tell my health care provider before I take this medicine? They need to know if you have any of these conditions:  kidney disease  an unusual or allergic reaction to oxaliplatin, other chemotherapy, other medicines, foods, dyes, or preservatives  pregnant or trying to get pregnant  breast-feeding How should I use this medicine? This drug is given as an infusion into a vein. It is administered in a  hospital or clinic by a specially trained health care professional. Talk to your pediatrician regarding the use of this medicine in children. Special care may be needed. Overdosage: If you think you have taken too much of this medicine contact a poison control center or emergency room at once. NOTE: This medicine is only for you. Do not share this medicine with others. What if I miss a dose? It is important not to miss a dose. Call your doctor or health care professional if you are unable to keep an appointment. What may interact with this medicine?  medicines to increase blood counts like filgrastim, pegfilgrastim, sargramostim  probenecid  some antibiotics like amikacin, gentamicin, neomycin, polymyxin B, streptomycin, tobramycin  zalcitabine Talk to your doctor or health care professional before taking any of these medicines:  acetaminophen  aspirin  ibuprofen  ketoprofen  naproxen This list may not describe all possible interactions. Give your health care provider a list of all the medicines, herbs, non-prescription drugs, or dietary supplements you use. Also tell them if you smoke, drink alcohol, or use illegal drugs. Some items may interact with your medicine. What should I watch for while using this medicine? Your condition will be monitored carefully while you are receiving this medicine. You will need important blood work done while you are taking this medicine. This medicine can make you more sensitive to cold. Do not drink cold drinks or use ice. Cover exposed skin before coming in contact with cold temperatures or cold objects. When out in cold weather wear warm clothing and cover your mouth and nose to warm the air that  goes into your lungs. Tell your doctor if you get sensitive to the cold. This drug may make you feel generally unwell. This is not uncommon, as chemotherapy can affect healthy cells as well as cancer cells. Report any side effects. Continue your course of  treatment even though you feel ill unless your doctor tells you to stop. In some cases, you may be given additional medicines to help with side effects. Follow all directions for their use. Call your doctor or health care professional for advice if you get a fever, chills or sore throat, or other symptoms of a cold or flu. Do not treat yourself. This drug decreases your body's ability to fight infections. Try to avoid being around people who are sick. This medicine may increase your risk to bruise or bleed. Call your doctor or health care professional if you notice any unusual bleeding. Be careful brushing and flossing your teeth or using a toothpick because you may get an infection or bleed more easily. If you have any dental work done, tell your dentist you are receiving this medicine. Avoid taking products that contain aspirin, acetaminophen, ibuprofen, naproxen, or ketoprofen unless instructed by your doctor. These medicines may hide a fever. Do not become pregnant while taking this medicine. Women should inform their doctor if they wish to become pregnant or think they might be pregnant. There is a potential for serious side effects to an unborn child. Talk to your health care professional or pharmacist for more information. Do not breast-feed an infant while taking this medicine. Call your doctor or health care professional if you get diarrhea. Do not treat yourself. What side effects may I notice from receiving this medicine? Side effects that you should report to your doctor or health care professional as soon as possible:  allergic reactions like skin rash, itching or hives, swelling of the face, lips, or tongue  low blood counts - This drug may decrease the number of white blood cells, red blood cells and platelets. You may be at increased risk for infections and bleeding.  signs of infection - fever or chills, cough, sore throat, pain or difficulty passing urine  signs of decreased  platelets or bleeding - bruising, pinpoint red spots on the skin, black, tarry stools, nosebleeds  signs of decreased red blood cells - unusually weak or tired, fainting spells, lightheadedness  breathing problems  chest pain, pressure  cough  diarrhea  jaw tightness  mouth sores  nausea and vomiting  pain, swelling, redness or irritation at the injection site  pain, tingling, numbness in the hands or feet  problems with balance, talking, walking  redness, blistering, peeling or loosening of the skin, including inside the mouth  trouble passing urine or change in the amount of urine Side effects that usually do not require medical attention (report to your doctor or health care professional if they continue or are bothersome):  changes in vision  constipation  hair loss  loss of appetite  metallic taste in the mouth or changes in taste  stomach pain This list may not describe all possible side effects. Call your doctor for medical advice about side effects. You may report side effects to FDA at 1-800-FDA-1088. Where should I keep my medicine? This drug is given in a hospital or clinic and will not be stored at home. NOTE: This sheet is a summary. It may not cover all possible information. If you have questions about this medicine, talk to your doctor, pharmacist, or health care  provider.  2020 Elsevier/Gold Standard (2007-12-24 17:22:47)  Leucovorin injection What is this medicine? LEUCOVORIN (loo koe VOR in) is used to prevent or treat the harmful effects of some medicines. This medicine is used to treat anemia caused by a low amount of folic acid in the body. It is also used with 5-fluorouracil (5-FU) to treat colon cancer. This medicine may be used for other purposes; ask your health care provider or pharmacist if you have questions. What should I tell my health care provider before I take this medicine? They need to know if you have any of these  conditions:  anemia from low levels of vitamin B-12 in the blood  an unusual or allergic reaction to leucovorin, folic acid, other medicines, foods, dyes, or preservatives  pregnant or trying to get pregnant  breast-feeding How should I use this medicine? This medicine is for injection into a muscle or into a vein. It is given by a health care professional in a hospital or clinic setting. Talk to your pediatrician regarding the use of this medicine in children. Special care may be needed. Overdosage: If you think you have taken too much of this medicine contact a poison control center or emergency room at once. NOTE: This medicine is only for you. Do not share this medicine with others. What if I miss a dose? This does not apply. What may interact with this medicine?  capecitabine  fluorouracil  phenobarbital  phenytoin  primidone  trimethoprim-sulfamethoxazole This list may not describe all possible interactions. Give your health care provider a list of all the medicines, herbs, non-prescription drugs, or dietary supplements you use. Also tell them if you smoke, drink alcohol, or use illegal drugs. Some items may interact with your medicine. What should I watch for while using this medicine? Your condition will be monitored carefully while you are receiving this medicine. This medicine may increase the side effects of 5-fluorouracil, 5-FU. Tell your doctor or health care professional if you have diarrhea or mouth sores that do not get better or that get worse. What side effects may I notice from receiving this medicine? Side effects that you should report to your doctor or health care professional as soon as possible:  allergic reactions like skin rash, itching or hives, swelling of the face, lips, or tongue  breathing problems  fever, infection  mouth sores  unusual bleeding or bruising  unusually weak or tired Side effects that usually do not require medical  attention (report to your doctor or health care professional if they continue or are bothersome):  constipation or diarrhea  loss of appetite  nausea, vomiting This list may not describe all possible side effects. Call your doctor for medical advice about side effects. You may report side effects to FDA at 1-800-FDA-1088. Where should I keep my medicine? This drug is given in a hospital or clinic and will not be stored at home. NOTE: This sheet is a summary. It may not cover all possible information. If you have questions about this medicine, talk to your doctor, pharmacist, or health care provider.  2020 Elsevier/Gold Standard (2007-12-03 16:50:29)  Fluorouracil, 5-FU injection What is this medicine? FLUOROURACIL, 5-FU (flure oh YOOR a sil) is a chemotherapy drug. It slows the growth of cancer cells. This medicine is used to treat many types of cancer like breast cancer, colon or rectal cancer, pancreatic cancer, and stomach cancer. This medicine may be used for other purposes; ask your health care provider or pharmacist if you have  questions. COMMON BRAND NAME(S): Adrucil What should I tell my health care provider before I take this medicine? They need to know if you have any of these conditions:  blood disorders  dihydropyrimidine dehydrogenase (DPD) deficiency  infection (especially a virus infection such as chickenpox, cold sores, or herpes)  kidney disease  liver disease  malnourished, poor nutrition  recent or ongoing radiation therapy  an unusual or allergic reaction to fluorouracil, other chemotherapy, other medicines, foods, dyes, or preservatives  pregnant or trying to get pregnant  breast-feeding How should I use this medicine? This drug is given as an infusion or injection into a vein. It is administered in a hospital or clinic by a specially trained health care professional. Talk to your pediatrician regarding the use of this medicine in children. Special care  may be needed. Overdosage: If you think you have taken too much of this medicine contact a poison control center or emergency room at once. NOTE: This medicine is only for you. Do not share this medicine with others. What if I miss a dose? It is important not to miss your dose. Call your doctor or health care professional if you are unable to keep an appointment. What may interact with this medicine?  allopurinol  cimetidine  dapsone  digoxin  hydroxyurea  leucovorin  levamisole  medicines for seizures like ethotoin, fosphenytoin, phenytoin  medicines to increase blood counts like filgrastim, pegfilgrastim, sargramostim  medicines that treat or prevent blood clots like warfarin, enoxaparin, and dalteparin  methotrexate  metronidazole  pyrimethamine  some other chemotherapy drugs like busulfan, cisplatin, estramustine, vinblastine  trimethoprim  trimetrexate  vaccines Talk to your doctor or health care professional before taking any of these medicines:  acetaminophen  aspirin  ibuprofen  ketoprofen  naproxen This list may not describe all possible interactions. Give your health care provider a list of all the medicines, herbs, non-prescription drugs, or dietary supplements you use. Also tell them if you smoke, drink alcohol, or use illegal drugs. Some items may interact with your medicine. What should I watch for while using this medicine? Visit your doctor for checks on your progress. This drug may make you feel generally unwell. This is not uncommon, as chemotherapy can affect healthy cells as well as cancer cells. Report any side effects. Continue your course of treatment even though you feel ill unless your doctor tells you to stop. In some cases, you may be given additional medicines to help with side effects. Follow all directions for their use. Call your doctor or health care professional for advice if you get a fever, chills or sore throat, or other  symptoms of a cold or flu. Do not treat yourself. This drug decreases your body's ability to fight infections. Try to avoid being around people who are sick. This medicine may increase your risk to bruise or bleed. Call your doctor or health care professional if you notice any unusual bleeding. Be careful brushing and flossing your teeth or using a toothpick because you may get an infection or bleed more easily. If you have any dental work done, tell your dentist you are receiving this medicine. Avoid taking products that contain aspirin, acetaminophen, ibuprofen, naproxen, or ketoprofen unless instructed by your doctor. These medicines may hide a fever. Do not become pregnant while taking this medicine. Women should inform their doctor if they wish to become pregnant or think they might be pregnant. There is a potential for serious side effects to an unborn child. Talk to  your health care professional or pharmacist for more information. Do not breast-feed an infant while taking this medicine. Men should inform their doctor if they wish to father a child. This medicine may lower sperm counts. Do not treat diarrhea with over the counter products. Contact your doctor if you have diarrhea that lasts more than 2 days or if it is severe and watery. This medicine can make you more sensitive to the sun. Keep out of the sun. If you cannot avoid being in the sun, wear protective clothing and use sunscreen. Do not use sun lamps or tanning beds/booths. What side effects may I notice from receiving this medicine? Side effects that you should report to your doctor or health care professional as soon as possible:  allergic reactions like skin rash, itching or hives, swelling of the face, lips, or tongue  low blood counts - this medicine may decrease the number of white blood cells, red blood cells and platelets. You may be at increased risk for infections and bleeding.  signs of infection - fever or chills, cough,  sore throat, pain or difficulty passing urine  signs of decreased platelets or bleeding - bruising, pinpoint red spots on the skin, black, tarry stools, blood in the urine  signs of decreased red blood cells - unusually weak or tired, fainting spells, lightheadedness  breathing problems  changes in vision  chest pain  mouth sores  nausea and vomiting  pain, swelling, redness at site where injected  pain, tingling, numbness in the hands or feet  redness, swelling, or sores on hands or feet  stomach pain  unusual bleeding Side effects that usually do not require medical attention (report to your doctor or health care professional if they continue or are bothersome):  changes in finger or toe nails  diarrhea  dry or itchy skin  hair loss  headache  loss of appetite  sensitivity of eyes to the light  stomach upset  unusually teary eyes This list may not describe all possible side effects. Call your doctor for medical advice about side effects. You may report side effects to FDA at 1-800-FDA-1088. Where should I keep my medicine? This drug is given in a hospital or clinic and will not be stored at home. NOTE: This sheet is a summary. It may not cover all possible information. If you have questions about this medicine, talk to your doctor, pharmacist, or health care provider.  2020 Elsevier/Gold Standard (2007-10-02 13:53:16)

## 2019-02-26 NOTE — Progress Notes (Signed)
Reviewed pt vital signs with Dr. Benay Spice and pt ok to treat with BP

## 2019-02-26 NOTE — Progress Notes (Signed)
  Oncology Nurse Navigator Documentation   Met with patient in infusion room to offer support during first chemo tx. Provided him with current list of support groups. He has my contact information and I encouraged him to reach out to me for support or questions.  Will follow as needed.

## 2019-02-27 ENCOUNTER — Telehealth: Payer: Self-pay | Admitting: *Deleted

## 2019-02-27 ENCOUNTER — Encounter: Payer: Self-pay | Admitting: Oncology

## 2019-02-27 NOTE — Telephone Encounter (Signed)
Called to provide contact information to reach her if her assistance is needed for any care coordination needs. She has called patient and introduced herself to him as well. She is requesting a copy of his medication list be faxed to her if this is allowed.

## 2019-02-27 NOTE — Progress Notes (Signed)
Called pt to introduce myself as his Financial Resource Specialist.  Unfortunately there aren't any foundations offering copay assistance for his Dx.  I offered the [employer] grant, went over what it covers and gave him the income requirement.  He stated he exceeds the requirement so he doesn't qualify for the grant at this time.  I requested that the front staff give him my card at his next visit for any questions or concerns he may have in the future.  °

## 2019-02-27 NOTE — Telephone Encounter (Signed)
-----   Message from Lillia Corporal, RN sent at 02/26/2019  3:41 PM EDT ----- Regarding: Dr. Benay Spice- first time chemo First time chemo FOLFOX

## 2019-02-27 NOTE — Telephone Encounter (Signed)
Called pt to see how he did with his FOLFOX treatment yesterday.  He reports doing well but had some nausea this am & nausea med helped. He expresses know side effects & reasons to call & states he will call with any questions/concerns.  Pump is doing OK & pt will be here tomorrow for D/C.

## 2019-02-28 ENCOUNTER — Other Ambulatory Visit: Payer: Self-pay

## 2019-02-28 ENCOUNTER — Inpatient Hospital Stay: Payer: Commercial Managed Care - PPO

## 2019-02-28 VITALS — BP 138/72 | HR 62 | Temp 98.2°F | Resp 17

## 2019-02-28 DIAGNOSIS — C20 Malignant neoplasm of rectum: Secondary | ICD-10-CM

## 2019-02-28 DIAGNOSIS — Z5111 Encounter for antineoplastic chemotherapy: Secondary | ICD-10-CM | POA: Diagnosis not present

## 2019-02-28 MED ORDER — SODIUM CHLORIDE 0.9% FLUSH
10.0000 mL | INTRAVENOUS | Status: DC | PRN
  Administered 2019-02-28: 10 mL
  Filled 2019-02-28: qty 10

## 2019-02-28 MED ORDER — HEPARIN SOD (PORK) LOCK FLUSH 100 UNIT/ML IV SOLN
500.0000 [IU] | Freq: Once | INTRAVENOUS | Status: AC | PRN
  Administered 2019-02-28: 13:00:00 500 [IU]
  Filled 2019-02-28: qty 5

## 2019-03-09 ENCOUNTER — Other Ambulatory Visit: Payer: Self-pay | Admitting: Oncology

## 2019-03-13 ENCOUNTER — Inpatient Hospital Stay: Payer: Commercial Managed Care - PPO

## 2019-03-13 ENCOUNTER — Other Ambulatory Visit: Payer: Self-pay

## 2019-03-13 ENCOUNTER — Encounter: Payer: Self-pay | Admitting: Nurse Practitioner

## 2019-03-13 ENCOUNTER — Inpatient Hospital Stay: Payer: Commercial Managed Care - PPO | Attending: Nurse Practitioner | Admitting: Nurse Practitioner

## 2019-03-13 ENCOUNTER — Telehealth: Payer: Self-pay

## 2019-03-13 VITALS — BP 140/95 | HR 70 | Temp 98.2°F | Resp 17 | Ht 72.0 in | Wt 174.3 lb

## 2019-03-13 DIAGNOSIS — Z5111 Encounter for antineoplastic chemotherapy: Secondary | ICD-10-CM | POA: Diagnosis not present

## 2019-03-13 DIAGNOSIS — Z23 Encounter for immunization: Secondary | ICD-10-CM | POA: Diagnosis not present

## 2019-03-13 DIAGNOSIS — G893 Neoplasm related pain (acute) (chronic): Secondary | ICD-10-CM | POA: Insufficient documentation

## 2019-03-13 DIAGNOSIS — Z95828 Presence of other vascular implants and grafts: Secondary | ICD-10-CM

## 2019-03-13 DIAGNOSIS — C19 Malignant neoplasm of rectosigmoid junction: Secondary | ICD-10-CM | POA: Diagnosis present

## 2019-03-13 DIAGNOSIS — C20 Malignant neoplasm of rectum: Secondary | ICD-10-CM

## 2019-03-13 DIAGNOSIS — R350 Frequency of micturition: Secondary | ICD-10-CM | POA: Insufficient documentation

## 2019-03-13 LAB — CMP (CANCER CENTER ONLY)
ALT: 49 U/L — ABNORMAL HIGH (ref 0–44)
AST: 36 U/L (ref 15–41)
Albumin: 4.2 g/dL (ref 3.5–5.0)
Alkaline Phosphatase: 84 U/L (ref 38–126)
Anion gap: 7 (ref 5–15)
BUN: 12 mg/dL (ref 6–20)
CO2: 28 mmol/L (ref 22–32)
Calcium: 9.3 mg/dL (ref 8.9–10.3)
Chloride: 105 mmol/L (ref 98–111)
Creatinine: 1.05 mg/dL (ref 0.61–1.24)
GFR, Est AFR Am: >60 mL/min (ref >60)
GFR, Estimated: >60 mL/min (ref >60)
Glucose, Bld: 99 mg/dL (ref 70–99)
Potassium: 4.1 mmol/L (ref 3.5–5.1)
Sodium: 140 mmol/L (ref 135–145)
Total Bilirubin: 0.7 mg/dL (ref 0.3–1.2)
Total Protein: 6.7 g/dL (ref 6.5–8.1)

## 2019-03-13 LAB — URINALYSIS, COMPLETE (UACMP) WITH MICROSCOPIC
Bacteria, UA: NONE SEEN
Bilirubin Urine: NEGATIVE
Glucose, UA: NEGATIVE mg/dL
Hgb urine dipstick: NEGATIVE
Ketones, ur: NEGATIVE mg/dL
Leukocytes,Ua: NEGATIVE
Nitrite: NEGATIVE
Protein, ur: NEGATIVE mg/dL
Specific Gravity, Urine: 1.025 (ref 1.005–1.030)
pH: 6.5 (ref 5.0–8.0)

## 2019-03-13 LAB — CBC WITH DIFFERENTIAL (CANCER CENTER ONLY)
Abs Immature Granulocytes: 0.01 10*3/uL (ref 0.00–0.07)
Basophils Absolute: 0 10*3/uL (ref 0.0–0.1)
Basophils Relative: 1 %
Eosinophils Absolute: 0.2 10*3/uL (ref 0.0–0.5)
Eosinophils Relative: 4 %
HCT: 41.9 % (ref 39.0–52.0)
Hemoglobin: 14.1 g/dL (ref 13.0–17.0)
Immature Granulocytes: 0 %
Lymphocytes Relative: 24 %
Lymphs Abs: 1 10*3/uL (ref 0.7–4.0)
MCH: 29.9 pg (ref 26.0–34.0)
MCHC: 33.7 g/dL (ref 30.0–36.0)
MCV: 89 fL (ref 80.0–100.0)
Monocytes Absolute: 0.5 10*3/uL (ref 0.1–1.0)
Monocytes Relative: 12 %
Neutro Abs: 2.5 10*3/uL (ref 1.7–7.7)
Neutrophils Relative %: 59 %
Platelet Count: 259 10*3/uL (ref 150–400)
RBC: 4.71 MIL/uL (ref 4.22–5.81)
RDW: 13.2 % (ref 11.5–15.5)
WBC Count: 4.2 10*3/uL (ref 4.0–10.5)
nRBC: 0 % (ref 0.0–0.2)

## 2019-03-13 MED ORDER — DEXTROSE 5 % IV SOLN
Freq: Once | INTRAVENOUS | Status: AC
  Administered 2019-03-13: 11:00:00 via INTRAVENOUS
  Filled 2019-03-13: qty 250

## 2019-03-13 MED ORDER — FLUOROURACIL CHEMO INJECTION 2.5 GM/50ML
400.0000 mg/m2 | Freq: Once | INTRAVENOUS | Status: AC
  Administered 2019-03-13: 800 mg via INTRAVENOUS
  Filled 2019-03-13: qty 16

## 2019-03-13 MED ORDER — HEPARIN SOD (PORK) LOCK FLUSH 100 UNIT/ML IV SOLN
500.0000 [IU] | Freq: Once | INTRAVENOUS | Status: DC | PRN
  Filled 2019-03-13: qty 5

## 2019-03-13 MED ORDER — SODIUM CHLORIDE 0.9 % IV SOLN
Freq: Once | INTRAVENOUS | Status: AC
  Administered 2019-03-13: 12:00:00 via INTRAVENOUS
  Filled 2019-03-13: qty 5

## 2019-03-13 MED ORDER — PALONOSETRON HCL INJECTION 0.25 MG/5ML
INTRAVENOUS | Status: AC
  Filled 2019-03-13: qty 5

## 2019-03-13 MED ORDER — LORAZEPAM 0.5 MG PO TABS: 0.5000 mg | ORAL_TABLET | Freq: Three times a day (TID) | ORAL | 0 refills | Status: DC | PRN

## 2019-03-13 MED ORDER — LEUCOVORIN CALCIUM INJECTION 350 MG
400.0000 mg/m2 | Freq: Once | INTRAVENOUS | Status: AC
  Administered 2019-03-13: 804 mg via INTRAVENOUS
  Filled 2019-03-13: qty 40.2

## 2019-03-13 MED ORDER — SODIUM CHLORIDE 0.9% FLUSH
10.0000 mL | INTRAVENOUS | Status: DC | PRN
  Filled 2019-03-13: qty 10

## 2019-03-13 MED ORDER — SODIUM CHLORIDE 0.9% FLUSH
10.0000 mL | INTRAVENOUS | Status: DC | PRN
  Administered 2019-03-13: 10 mL via INTRAVENOUS
  Filled 2019-03-13: qty 10

## 2019-03-13 MED ORDER — OXALIPLATIN CHEMO INJECTION 100 MG/20ML
85.0000 mg/m2 | Freq: Once | INTRAVENOUS | Status: AC
  Administered 2019-03-13: 170 mg via INTRAVENOUS
  Filled 2019-03-13: qty 34

## 2019-03-13 MED ORDER — PALONOSETRON HCL INJECTION 0.25 MG/5ML
0.2500 mg | Freq: Once | INTRAVENOUS | Status: AC
  Administered 2019-03-13: 0.25 mg via INTRAVENOUS

## 2019-03-13 MED ORDER — SODIUM CHLORIDE 0.9 % IV SOLN
2400.0000 mg/m2 | INTRAVENOUS | Status: DC
  Administered 2019-03-13: 4800 mg via INTRAVENOUS
  Filled 2019-03-13: qty 96

## 2019-03-13 NOTE — Patient Instructions (Signed)
[facility] Discharge Instructions for Patients Receiving Chemotherapy  Today you received the following chemotherapy agents: Oxaliplatin, Leucovorin, Fluorouracil   To help prevent nausea and vomiting after your treatment, we encourage you to take your nausea medication as directed.    If you develop nausea and vomiting that is not controlled by your nausea medication, call the clinic.   BELOW ARE SYMPTOMS THAT SHOULD BE REPORTED IMMEDIATELY:  *FEVER GREATER THAN 100.5 F  *CHILLS WITH OR WITHOUT FEVER  NAUSEA AND VOMITING THAT IS NOT CONTROLLED WITH YOUR NAUSEA MEDICATION  *UNUSUAL SHORTNESS OF BREATH  *UNUSUAL BRUISING OR BLEEDING  TENDERNESS IN MOUTH AND THROAT WITH OR WITHOUT PRESENCE OF ULCERS  *URINARY PROBLEMS  *BOWEL PROBLEMS  UNUSUAL RASH Items with * indicate a potential emergency and should be followed up as soon as possible.  Feel free to call the clinic should you have any questions or concerns. The clinic phone number is [phone].  Please show the [place] at check-in to the Emergency Department and triage nurse.  Oxaliplatin Injection What is this medicine? OXALIPLATIN (ox AL i PLA tin) is a chemotherapy drug. It targets fast dividing cells, like cancer cells, and causes these cells to die. This medicine is used to treat cancers of the colon and rectum, and many other cancers. This medicine may be used for other purposes; ask your health care provider or pharmacist if you have questions. COMMON BRAND NAME(S): Eloxatin What should I tell my health care provider before I take this medicine? They need to know if you have any of these conditions:  kidney disease  an unusual or allergic reaction to oxaliplatin, other chemotherapy, other medicines, foods, dyes, or preservatives  pregnant or trying to get pregnant  breast-feeding How should I use this medicine? This drug is given as an infusion into a vein. It is administered in a  hospital or clinic by a specially trained health care professional. Talk to your pediatrician regarding the use of this medicine in children. Special care may be needed. Overdosage: If you think you have taken too much of this medicine contact a poison control center or emergency room at once. NOTE: This medicine is only for you. Do not share this medicine with others. What if I miss a dose? It is important not to miss a dose. Call your doctor or health care professional if you are unable to keep an appointment. What may interact with this medicine?  medicines to increase blood counts like filgrastim, pegfilgrastim, sargramostim  probenecid  some antibiotics like amikacin, gentamicin, neomycin, polymyxin B, streptomycin, tobramycin  zalcitabine Talk to your doctor or health care professional before taking any of these medicines:  acetaminophen  aspirin  ibuprofen  ketoprofen  naproxen This list may not describe all possible interactions. Give your health care provider a list of all the medicines, herbs, non-prescription drugs, or dietary supplements you use. Also tell them if you smoke, drink alcohol, or use illegal drugs. Some items may interact with your medicine. What should I watch for while using this medicine? Your condition will be monitored carefully while you are receiving this medicine. You will need important blood work done while you are taking this medicine. This medicine can make you more sensitive to cold. Do not drink cold drinks or use ice. Cover exposed skin before coming in contact with cold temperatures or cold objects. When out in cold weather wear warm clothing and cover your mouth and nose to warm the air that  goes into your lungs. Tell your doctor if you get sensitive to the cold. This drug may make you feel generally unwell. This is not uncommon, as chemotherapy can affect healthy cells as well as cancer cells. Report any side effects. Continue your course of  treatment even though you feel ill unless your doctor tells you to stop. In some cases, you may be given additional medicines to help with side effects. Follow all directions for their use. Call your doctor or health care professional for advice if you get a fever, chills or sore throat, or other symptoms of a cold or flu. Do not treat yourself. This drug decreases your body's ability to fight infections. Try to avoid being around people who are sick. This medicine may increase your risk to bruise or bleed. Call your doctor or health care professional if you notice any unusual bleeding. Be careful brushing and flossing your teeth or using a toothpick because you may get an infection or bleed more easily. If you have any dental work done, tell your dentist you are receiving this medicine. Avoid taking products that contain aspirin, acetaminophen, ibuprofen, naproxen, or ketoprofen unless instructed by your doctor. These medicines may hide a fever. Do not become pregnant while taking this medicine. Women should inform their doctor if they wish to become pregnant or think they might be pregnant. There is a potential for serious side effects to an unborn child. Talk to your health care professional or pharmacist for more information. Do not breast-feed an infant while taking this medicine. Call your doctor or health care professional if you get diarrhea. Do not treat yourself. What side effects may I notice from receiving this medicine? Side effects that you should report to your doctor or health care professional as soon as possible:  allergic reactions like skin rash, itching or hives, swelling of the face, lips, or tongue  low blood counts - This drug may decrease the number of white blood cells, red blood cells and platelets. You may be at increased risk for infections and bleeding.  signs of infection - fever or chills, cough, sore throat, pain or difficulty passing urine  signs of decreased  platelets or bleeding - bruising, pinpoint red spots on the skin, black, tarry stools, nosebleeds  signs of decreased red blood cells - unusually weak or tired, fainting spells, lightheadedness  breathing problems  chest pain, pressure  cough  diarrhea  jaw tightness  mouth sores  nausea and vomiting  pain, swelling, redness or irritation at the injection site  pain, tingling, numbness in the hands or feet  problems with balance, talking, walking  redness, blistering, peeling or loosening of the skin, including inside the mouth  trouble passing urine or change in the amount of urine Side effects that usually do not require medical attention (report to your doctor or health care professional if they continue or are bothersome):  changes in vision  constipation  hair loss  loss of appetite  metallic taste in the mouth or changes in taste  stomach pain This list may not describe all possible side effects. Call your doctor for medical advice about side effects. You may report side effects to FDA at 1-800-FDA-1088. Where should I keep my medicine? This drug is given in a hospital or clinic and will not be stored at home. NOTE: This sheet is a summary. It may not cover all possible information. If you have questions about this medicine, talk to your doctor, pharmacist, or health care  provider.  2020 Elsevier/Gold Standard (2007-12-24 17:22:47)  Leucovorin injection What is this medicine? LEUCOVORIN (loo koe VOR in) is used to prevent or treat the harmful effects of some medicines. This medicine is used to treat anemia caused by a low amount of folic acid in the body. It is also used with 5-fluorouracil (5-FU) to treat colon cancer. This medicine may be used for other purposes; ask your health care provider or pharmacist if you have questions. What should I tell my health care provider before I take this medicine? They need to know if you have any of these  conditions:  anemia from low levels of vitamin B-12 in the blood  an unusual or allergic reaction to leucovorin, folic acid, other medicines, foods, dyes, or preservatives  pregnant or trying to get pregnant  breast-feeding How should I use this medicine? This medicine is for injection into a muscle or into a vein. It is given by a health care professional in a hospital or clinic setting. Talk to your pediatrician regarding the use of this medicine in children. Special care may be needed. Overdosage: If you think you have taken too much of this medicine contact a poison control center or emergency room at once. NOTE: This medicine is only for you. Do not share this medicine with others. What if I miss a dose? This does not apply. What may interact with this medicine?  capecitabine  fluorouracil  phenobarbital  phenytoin  primidone  trimethoprim-sulfamethoxazole This list may not describe all possible interactions. Give your health care provider a list of all the medicines, herbs, non-prescription drugs, or dietary supplements you use. Also tell them if you smoke, drink alcohol, or use illegal drugs. Some items may interact with your medicine. What should I watch for while using this medicine? Your condition will be monitored carefully while you are receiving this medicine. This medicine may increase the side effects of 5-fluorouracil, 5-FU. Tell your doctor or health care professional if you have diarrhea or mouth sores that do not get better or that get worse. What side effects may I notice from receiving this medicine? Side effects that you should report to your doctor or health care professional as soon as possible:  allergic reactions like skin rash, itching or hives, swelling of the face, lips, or tongue  breathing problems  fever, infection  mouth sores  unusual bleeding or bruising  unusually weak or tired Side effects that usually do not require medical  attention (report to your doctor or health care professional if they continue or are bothersome):  constipation or diarrhea  loss of appetite  nausea, vomiting This list may not describe all possible side effects. Call your doctor for medical advice about side effects. You may report side effects to FDA at 1-800-FDA-1088. Where should I keep my medicine? This drug is given in a hospital or clinic and will not be stored at home. NOTE: This sheet is a summary. It may not cover all possible information. If you have questions about this medicine, talk to your doctor, pharmacist, or health care provider.  2020 Elsevier/Gold Standard (2007-12-03 16:50:29)  Fluorouracil, 5-FU injection What is this medicine? FLUOROURACIL, 5-FU (flure oh YOOR a sil) is a chemotherapy drug. It slows the growth of cancer cells. This medicine is used to treat many types of cancer like breast cancer, colon or rectal cancer, pancreatic cancer, and stomach cancer. This medicine may be used for other purposes; ask your health care provider or pharmacist if you have  questions. COMMON BRAND NAME(S): Adrucil What should I tell my health care provider before I take this medicine? They need to know if you have any of these conditions:  blood disorders  dihydropyrimidine dehydrogenase (DPD) deficiency  infection (especially a virus infection such as chickenpox, cold sores, or herpes)  kidney disease  liver disease  malnourished, poor nutrition  recent or ongoing radiation therapy  an unusual or allergic reaction to fluorouracil, other chemotherapy, other medicines, foods, dyes, or preservatives  pregnant or trying to get pregnant  breast-feeding How should I use this medicine? This drug is given as an infusion or injection into a vein. It is administered in a hospital or clinic by a specially trained health care professional. Talk to your pediatrician regarding the use of this medicine in children. Special care  may be needed. Overdosage: If you think you have taken too much of this medicine contact a poison control center or emergency room at once. NOTE: This medicine is only for you. Do not share this medicine with others. What if I miss a dose? It is important not to miss your dose. Call your doctor or health care professional if you are unable to keep an appointment. What may interact with this medicine?  allopurinol  cimetidine  dapsone  digoxin  hydroxyurea  leucovorin  levamisole  medicines for seizures like ethotoin, fosphenytoin, phenytoin  medicines to increase blood counts like filgrastim, pegfilgrastim, sargramostim  medicines that treat or prevent blood clots like warfarin, enoxaparin, and dalteparin  methotrexate  metronidazole  pyrimethamine  some other chemotherapy drugs like busulfan, cisplatin, estramustine, vinblastine  trimethoprim  trimetrexate  vaccines Talk to your doctor or health care professional before taking any of these medicines:  acetaminophen  aspirin  ibuprofen  ketoprofen  naproxen This list may not describe all possible interactions. Give your health care provider a list of all the medicines, herbs, non-prescription drugs, or dietary supplements you use. Also tell them if you smoke, drink alcohol, or use illegal drugs. Some items may interact with your medicine. What should I watch for while using this medicine? Visit your doctor for checks on your progress. This drug may make you feel generally unwell. This is not uncommon, as chemotherapy can affect healthy cells as well as cancer cells. Report any side effects. Continue your course of treatment even though you feel ill unless your doctor tells you to stop. In some cases, you may be given additional medicines to help with side effects. Follow all directions for their use. Call your doctor or health care professional for advice if you get a fever, chills or sore throat, or other  symptoms of a cold or flu. Do not treat yourself. This drug decreases your body's ability to fight infections. Try to avoid being around people who are sick. This medicine may increase your risk to bruise or bleed. Call your doctor or health care professional if you notice any unusual bleeding. Be careful brushing and flossing your teeth or using a toothpick because you may get an infection or bleed more easily. If you have any dental work done, tell your dentist you are receiving this medicine. Avoid taking products that contain aspirin, acetaminophen, ibuprofen, naproxen, or ketoprofen unless instructed by your doctor. These medicines may hide a fever. Do not become pregnant while taking this medicine. Women should inform their doctor if they wish to become pregnant or think they might be pregnant. There is a potential for serious side effects to an unborn child. Talk to  your health care professional or pharmacist for more information. Do not breast-feed an infant while taking this medicine. Men should inform their doctor if they wish to father a child. This medicine may lower sperm counts. Do not treat diarrhea with over the counter products. Contact your doctor if you have diarrhea that lasts more than 2 days or if it is severe and watery. This medicine can make you more sensitive to the sun. Keep out of the sun. If you cannot avoid being in the sun, wear protective clothing and use sunscreen. Do not use sun lamps or tanning beds/booths. What side effects may I notice from receiving this medicine? Side effects that you should report to your doctor or health care professional as soon as possible:  allergic reactions like skin rash, itching or hives, swelling of the face, lips, or tongue  low blood counts - this medicine may decrease the number of white blood cells, red blood cells and platelets. You may be at increased risk for infections and bleeding.  signs of infection - fever or chills, cough,  sore throat, pain or difficulty passing urine  signs of decreased platelets or bleeding - bruising, pinpoint red spots on the skin, black, tarry stools, blood in the urine  signs of decreased red blood cells - unusually weak or tired, fainting spells, lightheadedness  breathing problems  changes in vision  chest pain  mouth sores  nausea and vomiting  pain, swelling, redness at site where injected  pain, tingling, numbness in the hands or feet  redness, swelling, or sores on hands or feet  stomach pain  unusual bleeding Side effects that usually do not require medical attention (report to your doctor or health care professional if they continue or are bothersome):  changes in finger or toe nails  diarrhea  dry or itchy skin  hair loss  headache  loss of appetite  sensitivity of eyes to the light  stomach upset  unusually teary eyes This list may not describe all possible side effects. Call your doctor for medical advice about side effects. You may report side effects to FDA at 1-800-FDA-1088. Where should I keep my medicine? This drug is given in a hospital or clinic and will not be stored at home. NOTE: This sheet is a summary. It may not cover all possible information. If you have questions about this medicine, talk to your doctor, pharmacist, or health care provider.  2020 Elsevier/Gold Standard (2007-10-02 13:53:16)

## 2019-03-13 NOTE — Progress Notes (Signed)
Per Ned Card NP let patient know that she sent in his prescription for ativan. Also let him know that he should NOT drive while taking that medication. Patient verbalized understanding. No further problems or concerns at this time.

## 2019-03-13 NOTE — Telephone Encounter (Signed)
TC to pt per Ned Card NP to let him know the urinalysis is unremarkable. Follow-up with PCP regarding urinary symptoms. Patient verbalized understanding. No further problems or concerns at this time.

## 2019-03-13 NOTE — Progress Notes (Signed)
[facility] OFFICE PROGRESS NOTE   Diagnosis: Rectal cancer  INTERVAL HISTORY:   Brandon Barry returns as scheduled.  He completed cycle 1 FOLFOX 02/26/2019.  He had nausea intermittently for several days after treatment.  Compazine partially effective.  He had a single mouth sore that has resolved.  No diarrhea.  Bowels are moving, continue to be irregular.  He takes MiraLAX and occasionally Senokot.  Cold sensitivity lasted about 1 week.  He has noted frequent urination at nighttime since beginning chemotherapy.  At times he notes difficulty initiating the urine stream and that the urine stream is weaker than typical.  He has been "clenching" his jaw at nighttime.  He wonders if there is a medication to help him relax.  Objective:  Vital signs in last 24 hours:  Blood pressure (!) 140/95, pulse 70, temperature 98.2 F (36.8 C), temperature source Temporal, resp. rate 17, height 6' (1.829 m), weight 174 lb 4.8 oz (79.1 kg), SpO2 100 %.    HEENT: No thrush or ulcers. GI: Abdomen soft and nontender.  No hepatomegaly. Vascular: No leg edema. Neuro: Alert and oriented.   Skin: Palms without erythema. Port-A-Cath without erythema.   Lab Results:  Lab Results  Component Value Date   WBC 8.0 02/26/2019   HGB 14.6 02/26/2019   HCT 44.2 02/26/2019   MCV 89.8 02/26/2019   PLT 274 02/26/2019   NEUTROABS 5.7 02/26/2019    Imaging:  No results found.  Medications: I have reviewed the patient's current medications.  Assessment/Plan: 1. Rectosigmoid cancer   Colonoscopy by Dr. Benson Norway 02/04/2019-fungating infiltrative and ulcerated nonobstructing large mass in the rectum.  The mass was circumferential measuring 3 cm in length.  There was no bleeding present.  The mass was 10 cm from the anal verge.  Biopsy showed invasive adenocarcinoma, moderately differentiated; MLH1, MSH2, MSH6 and PMS2 intact.    CTs abdomen and pelvis 02/04/2019-apparent colorectal mass measuring 3.9 x  5.4 x 3.2 cm involving the distal sigmoid colon and proximal rectum with adjacent borderline enlarged suspicious mesorectal lymph nodes; indeterminate lesion in segment 6 of the liver measuring 1.4 x 1.1 cm.  MRI pelvis 02/13/2019- T3c, N1 tumor at 11.5 cm from the anal verge, 6.7 cm from the internal anal sphincter, 3 lymph nodes greater than 5 mm, largest 8 mm  CT chest 02/13/2019-negative for metastatic disease  MRI liver 02/13/2019- 3.6 cm hemangioma in the posterior right hepatic lobe, 10 mm indeterminate lesion in the posterior right hepatic lobe-potentially an atypical hemangioma- solitary metastasis not excluded  Cycle 1 FOLFOX 02/26/2019  Cycle 2 FOLFOX 03/13/2019 2. Change in bowel habits/rectal pain secondary to #1 3. Port-A-Cath placement, Dr. Hassell Done, 02/21/2019  Disposition: Brandon Barry appears stable.  He has completed 1 cycle of FOLFOX.  Plan to proceed with cycle 2 today as scheduled.  He had delayed nausea.  We will add Emend to the premedication regimen.  I am also sending a prescription for Ativan to his pharmacy.  He understands this can be used for nausea or anxiety.  He further understands he should not take this medication with opioids due to the possibility of respiratory depression and sedation.  We reviewed the CBC from today.  Counts adequate to proceed with treatment.  We are checking a urinalysis today due to complaint of frequent urination at nighttime.  If negative he will follow-up with his PCP.  He will return for lab, follow-up, cycle 2 FOLFOX in 2 weeks.  He will contact the office  in the interim with any problems.  Plan reviewed with Dr. Benay Spice.  Ned Card ANP/GNP-BC   03/13/2019  10:20 AM

## 2019-03-14 ENCOUNTER — Telehealth: Payer: Self-pay | Admitting: Oncology

## 2019-03-14 NOTE — Telephone Encounter (Signed)
Called and spoke with patient. Confirmed appts  °

## 2019-03-15 ENCOUNTER — Inpatient Hospital Stay: Payer: Commercial Managed Care - PPO

## 2019-03-15 ENCOUNTER — Other Ambulatory Visit: Payer: Self-pay

## 2019-03-15 VITALS — BP 158/101 | HR 59 | Temp 99.1°F | Resp 20

## 2019-03-15 DIAGNOSIS — Z5111 Encounter for antineoplastic chemotherapy: Secondary | ICD-10-CM | POA: Diagnosis not present

## 2019-03-15 DIAGNOSIS — C20 Malignant neoplasm of rectum: Secondary | ICD-10-CM

## 2019-03-15 MED ORDER — SODIUM CHLORIDE 0.9% FLUSH
10.0000 mL | INTRAVENOUS | Status: DC | PRN
  Administered 2019-03-15: 10 mL
  Filled 2019-03-15: qty 10

## 2019-03-15 MED ORDER — HEPARIN SOD (PORK) LOCK FLUSH 100 UNIT/ML IV SOLN
500.0000 [IU] | Freq: Once | INTRAVENOUS | Status: AC | PRN
  Administered 2019-03-15: 500 [IU]
  Filled 2019-03-15: qty 5

## 2019-03-23 ENCOUNTER — Other Ambulatory Visit: Payer: Self-pay | Admitting: Oncology

## 2019-03-27 ENCOUNTER — Inpatient Hospital Stay (HOSPITAL_BASED_OUTPATIENT_CLINIC_OR_DEPARTMENT_OTHER): Payer: Commercial Managed Care - PPO | Admitting: Oncology

## 2019-03-27 ENCOUNTER — Inpatient Hospital Stay: Payer: Commercial Managed Care - PPO

## 2019-03-27 ENCOUNTER — Other Ambulatory Visit: Payer: Self-pay

## 2019-03-27 VITALS — BP 157/96 | HR 71 | Temp 98.3°F | Resp 18 | Ht 72.0 in | Wt 174.2 lb

## 2019-03-27 DIAGNOSIS — C20 Malignant neoplasm of rectum: Secondary | ICD-10-CM

## 2019-03-27 DIAGNOSIS — Z95828 Presence of other vascular implants and grafts: Secondary | ICD-10-CM

## 2019-03-27 DIAGNOSIS — Z5111 Encounter for antineoplastic chemotherapy: Secondary | ICD-10-CM | POA: Diagnosis not present

## 2019-03-27 DIAGNOSIS — Z23 Encounter for immunization: Secondary | ICD-10-CM

## 2019-03-27 LAB — CBC WITH DIFFERENTIAL (CANCER CENTER ONLY)
Abs Immature Granulocytes: 0 10*3/uL (ref 0.00–0.07)
Basophils Absolute: 0 10*3/uL (ref 0.0–0.1)
Basophils Relative: 1 %
Eosinophils Absolute: 0.2 10*3/uL (ref 0.0–0.5)
Eosinophils Relative: 4 %
HCT: 40.4 % (ref 39.0–52.0)
Hemoglobin: 13.6 g/dL (ref 13.0–17.0)
Immature Granulocytes: 0 %
Lymphocytes Relative: 27 %
Lymphs Abs: 1.2 10*3/uL (ref 0.7–4.0)
MCH: 29.6 pg (ref 26.0–34.0)
MCHC: 33.7 g/dL (ref 30.0–36.0)
MCV: 88 fL (ref 80.0–100.0)
Monocytes Absolute: 0.6 10*3/uL (ref 0.1–1.0)
Monocytes Relative: 15 %
Neutro Abs: 2.3 10*3/uL (ref 1.7–7.7)
Neutrophils Relative %: 53 %
Platelet Count: 192 10*3/uL (ref 150–400)
RBC: 4.59 MIL/uL (ref 4.22–5.81)
RDW: 13.8 % (ref 11.5–15.5)
WBC Count: 4.3 10*3/uL (ref 4.0–10.5)
nRBC: 0 % (ref 0.0–0.2)

## 2019-03-27 LAB — CMP (CANCER CENTER ONLY)
ALT: 64 U/L — ABNORMAL HIGH (ref 0–44)
AST: 35 U/L (ref 15–41)
Albumin: 4 g/dL (ref 3.5–5.0)
Alkaline Phosphatase: 83 U/L (ref 38–126)
Anion gap: 8 (ref 5–15)
BUN: 16 mg/dL (ref 6–20)
CO2: 26 mmol/L (ref 22–32)
Calcium: 8.9 mg/dL (ref 8.9–10.3)
Chloride: 106 mmol/L (ref 98–111)
Creatinine: 0.89 mg/dL (ref 0.61–1.24)
GFR, Est AFR Am: >60 mL/min (ref >60)
GFR, Estimated: >60 mL/min (ref >60)
Glucose, Bld: 105 mg/dL — ABNORMAL HIGH (ref 70–99)
Potassium: 4 mmol/L (ref 3.5–5.1)
Sodium: 140 mmol/L (ref 135–145)
Total Bilirubin: 0.7 mg/dL (ref 0.3–1.2)
Total Protein: 6.4 g/dL — ABNORMAL LOW (ref 6.5–8.1)

## 2019-03-27 MED ORDER — PALONOSETRON HCL INJECTION 0.25 MG/5ML
INTRAVENOUS | Status: AC
  Filled 2019-03-27: qty 5

## 2019-03-27 MED ORDER — INFLUENZA VAC SPLIT QUAD 0.5 ML IM SUSY
0.5000 mL | PREFILLED_SYRINGE | Freq: Once | INTRAMUSCULAR | Status: AC
  Administered 2019-03-27: 0.5 mL via INTRAMUSCULAR

## 2019-03-27 MED ORDER — PALONOSETRON HCL INJECTION 0.25 MG/5ML
0.2500 mg | Freq: Once | INTRAVENOUS | Status: AC
  Administered 2019-03-27: 0.25 mg via INTRAVENOUS

## 2019-03-27 MED ORDER — INFLUENZA VAC SPLIT QUAD 0.5 ML IM SUSY
PREFILLED_SYRINGE | INTRAMUSCULAR | Status: AC
  Filled 2019-03-27: qty 0.5

## 2019-03-27 MED ORDER — DEXTROSE 5 % IV SOLN
Freq: Once | INTRAVENOUS | Status: AC
  Administered 2019-03-27: 12:00:00 via INTRAVENOUS
  Filled 2019-03-27: qty 250

## 2019-03-27 MED ORDER — SODIUM CHLORIDE 0.9% FLUSH
10.0000 mL | INTRAVENOUS | Status: DC | PRN
  Administered 2019-03-27: 10 mL
  Filled 2019-03-27: qty 10

## 2019-03-27 MED ORDER — SODIUM CHLORIDE 0.9 % IV SOLN
Freq: Once | INTRAVENOUS | Status: AC
  Administered 2019-03-27: 12:00:00 via INTRAVENOUS
  Filled 2019-03-27: qty 5

## 2019-03-27 MED ORDER — OXALIPLATIN CHEMO INJECTION 100 MG/20ML
85.0000 mg/m2 | Freq: Once | INTRAVENOUS | Status: AC
  Administered 2019-03-27: 170 mg via INTRAVENOUS
  Filled 2019-03-27: qty 34

## 2019-03-27 MED ORDER — FLUOROURACIL CHEMO INJECTION 2.5 GM/50ML
400.0000 mg/m2 | Freq: Once | INTRAVENOUS | Status: AC
  Administered 2019-03-27: 800 mg via INTRAVENOUS
  Filled 2019-03-27: qty 16

## 2019-03-27 MED ORDER — LEUCOVORIN CALCIUM INJECTION 350 MG
400.0000 mg/m2 | Freq: Once | INTRAVENOUS | Status: AC
  Administered 2019-03-27: 13:00:00 804 mg via INTRAVENOUS
  Filled 2019-03-27: qty 40.2

## 2019-03-27 MED ORDER — SODIUM CHLORIDE 0.9 % IV SOLN
2400.0000 mg/m2 | INTRAVENOUS | Status: DC
  Administered 2019-03-27: 4800 mg via INTRAVENOUS
  Filled 2019-03-27: qty 96

## 2019-03-27 NOTE — Progress Notes (Signed)
  [facility] OFFICE PROGRESS NOTE   Diagnosis: Rectal cancer  INTERVAL HISTORY:   Brandon Barry completed cycle 2 FOLFOX on 03/13/2019.  He reports much less nausea with this cycle of chemotherapy.  He has cold sensitivity following chemotherapy.  No other neuropathy symptoms.  No mouth sores.  He is having bowel movements.  No significant diarrhea.  Objective:  Vital signs in last 24 hours:  Blood pressure (!) 157/96, pulse 71, temperature 98.3 F (36.8 C), temperature source Temporal, resp. rate 18, height 6' (1.829 m), weight 174 lb 3.2 oz (79 kg), SpO2 100 %.    HEENT: No thrush or ulcers GI: No hepatomegaly, nontender Vascular: No leg edema  Skin: Palms without erythema  Portacath/PICC-without erythema  Lab Results:  Lab Results  Component Value Date   WBC 4.3 03/27/2019   HGB 13.6 03/27/2019   HCT 40.4 03/27/2019   MCV 88.0 03/27/2019   PLT 192 03/27/2019   NEUTROABS 2.3 03/27/2019    CMP  Lab Results  Component Value Date   NA 140 03/13/2019   K 4.1 03/13/2019   CL 105 03/13/2019   CO2 28 03/13/2019   GLUCOSE 99 03/13/2019   BUN 12 03/13/2019   CREATININE 1.05 03/13/2019   CALCIUM 9.3 03/13/2019   PROT 6.7 03/13/2019   ALBUMIN 4.2 03/13/2019   AST 36 03/13/2019   ALT 49 (H) 03/13/2019   ALKPHOS 84 03/13/2019   BILITOT 0.7 03/13/2019   GFRNONAA >60 03/13/2019   GFRAA >60 03/13/2019    Lab Results  Component Value Date   CEA1 4.36 02/19/2019     Medications: I have reviewed the patient's current medications.   Assessment/Plan:  1. Rectosigmoid cancer   Colonoscopy by Dr. Benson Norway 02/04/2019-fungating infiltrative and ulcerated nonobstructing large mass in the rectum.  The mass was circumferential measuring 3 cm in length.  There was no bleeding present.  The mass was 10 cm from the anal verge.  Biopsy showed invasive adenocarcinoma, moderately differentiated; MLH1, MSH2, MSH6 and PMS2 intact.    CTs abdomen and pelvis  02/04/2019-apparent colorectal mass measuring 3.9 x 5.4 x 3.2 cm involving the distal sigmoid colon and proximal rectum with adjacent borderline enlarged suspicious mesorectal lymph nodes; indeterminate lesion in segment 6 of the liver measuring 1.4 x 1.1 cm.  MRI pelvis 02/13/2019- T3c, N1 tumor at 11.5 cm from the anal verge, 6.7 cm from the internal anal sphincter, 3 lymph nodes greater than 5 mm, largest 8 mm  CT chest 02/13/2019-negative for metastatic disease  MRI liver 02/13/2019- 3.6 cm hemangioma in the posterior right hepatic lobe, 10 mm indeterminate lesion in the posterior right hepatic lobe-potentially an atypical hemangioma- solitary metastasis not excluded  Cycle 1 FOLFOX 02/26/2019  Cycle 2 FOLFOX 03/13/2019, Emend added  Cycle 3 FOLFOX 03/27/2019 2. Change in bowel habits/rectal pain secondary to #1 3. Port-A-Cath placement, Dr. Hassell Done, 02/21/2019   Disposition: [name] is tolerating the chemotherapy well.  He will complete cycle 3 FOLFOX today.  He will receive Emend with chemotherapy again today.  He will return for office visit and chemotherapy in 2 weeks.  He will receive an influenza vaccine today.  Betsy Coder, MD  03/27/2019  10:42 AM

## 2019-03-27 NOTE — Patient Instructions (Addendum)
[facility] Discharge Instructions for Patients Receiving Chemotherapy  Today you received the following chemotherapy agents: Oxaliplatin, Leucovorin, Fluorouracil   To help prevent nausea and vomiting after your treatment, we encourage you to take your nausea medication as directed.    If you develop nausea and vomiting that is not controlled by your nausea medication, call the clinic.   BELOW ARE SYMPTOMS THAT SHOULD BE REPORTED IMMEDIATELY:  *FEVER GREATER THAN 100.5 F  *CHILLS WITH OR WITHOUT FEVER  NAUSEA AND VOMITING THAT IS NOT CONTROLLED WITH YOUR NAUSEA MEDICATION  *UNUSUAL SHORTNESS OF BREATH  *UNUSUAL BRUISING OR BLEEDING  TENDERNESS IN MOUTH AND THROAT WITH OR WITHOUT PRESENCE OF ULCERS  *URINARY PROBLEMS  *BOWEL PROBLEMS  UNUSUAL RASH Items with * indicate a potential emergency and should be followed up as soon as possible.  Feel free to call the clinic should you have any questions or concerns. The clinic phone number is [phone].  Please show the [facility] at check-in to the Emergency Department and triage nurse.  Influenza Virus Vaccine (Flucelvax) What is this medicine? INFLUENZA VIRUS VACCINE (in floo EN zuh VAHY ruhs vak SEEN) helps to reduce the risk of getting influenza also known as the flu. The vaccine only helps protect you against some strains of the flu. This medicine may be used for other purposes; ask your health care provider or pharmacist if you have questions. COMMON BRAND NAME(S): FLUCELVAX What should I tell my health care provider before I take this medicine? They need to know if you have any of these conditions:  bleeding disorder like hemophilia  fever or infection  Guillain-Barre syndrome or other neurological problems  immune system problems  infection with the human immunodeficiency virus (HIV) or AIDS  low blood platelet counts  multiple sclerosis  an unusual or allergic reaction to influenza  virus vaccine, other medicines, foods, dyes or preservatives  pregnant or trying to get pregnant  breast-feeding How should I use this medicine? This vaccine is for injection into a muscle. It is given by a health care professional. A copy of Vaccine Information Statements will be given before each vaccination. Read this sheet carefully each time. The sheet may change frequently. Talk to your pediatrician regarding the use of this medicine in children. Special care may be needed. Overdosage: If you think you've taken too much of this medicine contact a poison control center or emergency room at once. Overdosage: If you think you have taken too much of this medicine contact a poison control center or emergency room at once. NOTE: This medicine is only for you. Do not share this medicine with others. What if I miss a dose? This does not apply. What may interact with this medicine?  chemotherapy or radiation therapy  medicines that lower your immune system like etanercept, anakinra, infliximab, and adalimumab  medicines that treat or prevent blood clots like warfarin  phenytoin  steroid medicines like prednisone or cortisone  theophylline  vaccines This list may not describe all possible interactions. Give your health care provider a list of all the medicines, herbs, non-prescription drugs, or dietary supplements you use. Also tell them if you smoke, drink alcohol, or use illegal drugs. Some items may interact with your medicine. What should I watch for while using this medicine? Report any side effects that do not go away within 3 days to your doctor or health care professional. Call your health care provider if any unusual symptoms occur within 6 weeks of  receiving this vaccine. You may still catch the flu, but the illness is not usually as bad. You cannot get the flu from the vaccine. The vaccine will not protect against colds or other illnesses that may cause fever. The vaccine is  needed every year. What side effects may I notice from receiving this medicine? Side effects that you should report to your doctor or health care professional as soon as possible:  allergic reactions like skin rash, itching or hives, swelling of the face, lips, or tongue Side effects that usually do not require medical attention (Report these to your doctor or health care professional if they continue or are bothersome.):  fever  headache  muscle aches and pains  pain, tenderness, redness, or swelling at the injection site  tiredness This list may not describe all possible side effects. Call your doctor for medical advice about side effects. You may report side effects to FDA at 1-800-FDA-1088. Where should I keep my medicine? The vaccine will be given by a health care professional in a clinic, pharmacy, doctor's office, or other health care setting. You will not be given vaccine doses to store at home. NOTE: This sheet is a summary. It may not cover all possible information. If you have questions about this medicine, talk to your doctor, pharmacist, or health care provider.  2020 Elsevier/Gold Standard (2011-05-10 14:06:47)

## 2019-03-28 ENCOUNTER — Telehealth: Payer: Self-pay | Admitting: Oncology

## 2019-03-28 NOTE — Telephone Encounter (Signed)
I talk with patient regarding schedule  

## 2019-03-29 ENCOUNTER — Inpatient Hospital Stay: Payer: Commercial Managed Care - PPO

## 2019-03-29 ENCOUNTER — Other Ambulatory Visit: Payer: Self-pay

## 2019-03-29 VITALS — BP 132/92 | HR 75 | Temp 99.1°F | Resp 18

## 2019-03-29 DIAGNOSIS — Z5111 Encounter for antineoplastic chemotherapy: Secondary | ICD-10-CM | POA: Diagnosis not present

## 2019-03-29 DIAGNOSIS — C20 Malignant neoplasm of rectum: Secondary | ICD-10-CM

## 2019-03-29 MED ORDER — SODIUM CHLORIDE 0.9% FLUSH
10.0000 mL | INTRAVENOUS | Status: DC | PRN
  Administered 2019-03-29: 10 mL
  Filled 2019-03-29: qty 10

## 2019-03-29 MED ORDER — HEPARIN SOD (PORK) LOCK FLUSH 100 UNIT/ML IV SOLN
500.0000 [IU] | Freq: Once | INTRAVENOUS | Status: AC | PRN
  Administered 2019-03-29: 500 [IU]
  Filled 2019-03-29: qty 5

## 2019-04-04 DIAGNOSIS — L271 Localized skin eruption due to drugs and medicaments taken internally: Secondary | ICD-10-CM

## 2019-04-05 ENCOUNTER — Other Ambulatory Visit: Payer: Self-pay | Admitting: Oncology

## 2019-04-10 ENCOUNTER — Encounter: Payer: Self-pay | Admitting: Nurse Practitioner

## 2019-04-10 ENCOUNTER — Inpatient Hospital Stay: Payer: Commercial Managed Care - PPO

## 2019-04-10 ENCOUNTER — Other Ambulatory Visit: Payer: Self-pay

## 2019-04-10 ENCOUNTER — Inpatient Hospital Stay: Payer: Commercial Managed Care - PPO | Admitting: Nurse Practitioner

## 2019-04-10 VITALS — BP 146/88 | HR 74 | Temp 98.3°F | Resp 17 | Ht 72.0 in | Wt 170.5 lb

## 2019-04-10 DIAGNOSIS — Z95828 Presence of other vascular implants and grafts: Secondary | ICD-10-CM

## 2019-04-10 DIAGNOSIS — C20 Malignant neoplasm of rectum: Secondary | ICD-10-CM | POA: Diagnosis not present

## 2019-04-10 DIAGNOSIS — Z5111 Encounter for antineoplastic chemotherapy: Secondary | ICD-10-CM | POA: Diagnosis not present

## 2019-04-10 LAB — CMP (CANCER CENTER ONLY)
ALT: 67 U/L — ABNORMAL HIGH (ref 0–44)
AST: 31 U/L (ref 15–41)
Albumin: 4.2 g/dL (ref 3.5–5.0)
Alkaline Phosphatase: 87 U/L (ref 38–126)
Anion gap: 10 (ref 5–15)
BUN: 13 mg/dL (ref 6–20)
CO2: 22 mmol/L (ref 22–32)
Calcium: 9.1 mg/dL (ref 8.9–10.3)
Chloride: 108 mmol/L (ref 98–111)
Creatinine: 0.89 mg/dL (ref 0.61–1.24)
GFR, Est AFR Am: >60 mL/min (ref >60)
GFR, Estimated: >60 mL/min (ref >60)
Glucose, Bld: 90 mg/dL (ref 70–99)
Potassium: 3.7 mmol/L (ref 3.5–5.1)
Sodium: 140 mmol/L (ref 135–145)
Total Bilirubin: 0.6 mg/dL (ref 0.3–1.2)
Total Protein: 6.7 g/dL (ref 6.5–8.1)

## 2019-04-10 LAB — CBC WITH DIFFERENTIAL (CANCER CENTER ONLY)
Abs Immature Granulocytes: 0.01 10*3/uL (ref 0.00–0.07)
Basophils Absolute: 0 10*3/uL (ref 0.0–0.1)
Basophils Relative: 1 %
Eosinophils Absolute: 0.2 10*3/uL (ref 0.0–0.5)
Eosinophils Relative: 3 %
HCT: 40.6 % (ref 39.0–52.0)
Hemoglobin: 13.8 g/dL (ref 13.0–17.0)
Immature Granulocytes: 0 %
Lymphocytes Relative: 24 %
Lymphs Abs: 1.4 10*3/uL (ref 0.7–4.0)
MCH: 29.7 pg (ref 26.0–34.0)
MCHC: 34 g/dL (ref 30.0–36.0)
MCV: 87.5 fL (ref 80.0–100.0)
Monocytes Absolute: 0.7 10*3/uL (ref 0.1–1.0)
Monocytes Relative: 13 %
Neutro Abs: 3.3 10*3/uL (ref 1.7–7.7)
Neutrophils Relative %: 59 %
Platelet Count: 202 10*3/uL (ref 150–400)
RBC: 4.64 MIL/uL (ref 4.22–5.81)
RDW: 14.6 % (ref 11.5–15.5)
WBC Count: 5.6 10*3/uL (ref 4.0–10.5)
nRBC: 0 % (ref 0.0–0.2)

## 2019-04-10 MED ORDER — SODIUM CHLORIDE 0.9 % IV SOLN
Freq: Once | INTRAVENOUS | Status: AC
  Administered 2019-04-10: 13:00:00 via INTRAVENOUS
  Filled 2019-04-10: qty 5

## 2019-04-10 MED ORDER — PALONOSETRON HCL INJECTION 0.25 MG/5ML
INTRAVENOUS | Status: AC
  Filled 2019-04-10: qty 5

## 2019-04-10 MED ORDER — DEXTROSE 5 % IV SOLN
Freq: Once | INTRAVENOUS | Status: AC
  Administered 2019-04-10: 13:00:00 via INTRAVENOUS
  Filled 2019-04-10: qty 250

## 2019-04-10 MED ORDER — FLUOROURACIL CHEMO INJECTION 2.5 GM/50ML
400.0000 mg/m2 | Freq: Once | INTRAVENOUS | Status: AC
  Administered 2019-04-10: 800 mg via INTRAVENOUS
  Filled 2019-04-10: qty 16

## 2019-04-10 MED ORDER — SODIUM CHLORIDE 0.9 % IV SOLN
2400.0000 mg/m2 | INTRAVENOUS | Status: DC
  Administered 2019-04-10: 4800 mg via INTRAVENOUS
  Filled 2019-04-10: qty 96

## 2019-04-10 MED ORDER — SODIUM CHLORIDE 0.9% FLUSH
10.0000 mL | INTRAVENOUS | Status: DC | PRN
  Administered 2019-04-10: 10 mL
  Filled 2019-04-10: qty 10

## 2019-04-10 MED ORDER — PALONOSETRON HCL INJECTION 0.25 MG/5ML
0.2500 mg | Freq: Once | INTRAVENOUS | Status: AC
  Administered 2019-04-10: 0.25 mg via INTRAVENOUS

## 2019-04-10 MED ORDER — LEUCOVORIN CALCIUM INJECTION 350 MG
400.0000 mg/m2 | Freq: Once | INTRAVENOUS | Status: AC
  Administered 2019-04-10: 804 mg via INTRAVENOUS
  Filled 2019-04-10: qty 40.2

## 2019-04-10 MED ORDER — OXALIPLATIN CHEMO INJECTION 100 MG/20ML
85.0000 mg/m2 | Freq: Once | INTRAVENOUS | Status: AC
  Administered 2019-04-10: 170 mg via INTRAVENOUS
  Filled 2019-04-10: qty 34

## 2019-04-10 NOTE — Patient Instructions (Signed)
[facility] Discharge Instructions for Patients Receiving Chemotherapy  Today you received the following chemotherapy agents: Oxaliplatin, Leucovorin, Fluorouracil   To help prevent nausea and vomiting after your treatment, we encourage you to take your nausea medication as directed.    If you develop nausea and vomiting that is not controlled by your nausea medication, call the clinic.   BELOW ARE SYMPTOMS THAT SHOULD BE REPORTED IMMEDIATELY:  *FEVER GREATER THAN 100.5 F  *CHILLS WITH OR WITHOUT FEVER  NAUSEA AND VOMITING THAT IS NOT CONTROLLED WITH YOUR NAUSEA MEDICATION  *UNUSUAL SHORTNESS OF BREATH  *UNUSUAL BRUISING OR BLEEDING  TENDERNESS IN MOUTH AND THROAT WITH OR WITHOUT PRESENCE OF ULCERS  *URINARY PROBLEMS  *BOWEL PROBLEMS  UNUSUAL RASH Items with * indicate a potential emergency and should be followed up as soon as possible.  Feel free to call the clinic should you have any questions or concerns. The clinic phone number is [phone].  Please show the [facility] at check-in to the Emergency Department and triage nurse.  Influenza Virus Vaccine (Flucelvax) What is this medicine? INFLUENZA VIRUS VACCINE (in floo EN zuh VAHY ruhs vak SEEN) helps to reduce the risk of getting influenza also known as the flu. The vaccine only helps protect you against some strains of the flu. This medicine may be used for other purposes; ask your health care provider or pharmacist if you have questions. COMMON BRAND NAME(S): FLUCELVAX What should I tell my health care provider before I take this medicine? They need to know if you have any of these conditions:  bleeding disorder like hemophilia  fever or infection  Guillain-Barre syndrome or other neurological problems  immune system problems  infection with the human immunodeficiency virus (HIV) or AIDS  low blood platelet counts  multiple sclerosis  an unusual or allergic reaction to influenza  virus vaccine, other medicines, foods, dyes or preservatives  pregnant or trying to get pregnant  breast-feeding How should I use this medicine? This vaccine is for injection into a muscle. It is given by a health care professional. A copy of Vaccine Information Statements will be given before each vaccination. Read this sheet carefully each time. The sheet may change frequently. Talk to your pediatrician regarding the use of this medicine in children. Special care may be needed. Overdosage: If you think you've taken too much of this medicine contact a poison control center or emergency room at once. Overdosage: If you think you have taken too much of this medicine contact a poison control center or emergency room at once. NOTE: This medicine is only for you. Do not share this medicine with others. What if I miss a dose? This does not apply. What may interact with this medicine?  chemotherapy or radiation therapy  medicines that lower your immune system like etanercept, anakinra, infliximab, and adalimumab  medicines that treat or prevent blood clots like warfarin  phenytoin  steroid medicines like prednisone or cortisone  theophylline  vaccines This list may not describe all possible interactions. Give your health care provider a list of all the medicines, herbs, non-prescription drugs, or dietary supplements you use. Also tell them if you smoke, drink alcohol, or use illegal drugs. Some items may interact with your medicine. What should I watch for while using this medicine? Report any side effects that do not go away within 3 days to your doctor or health care professional. Call your health care provider if any unusual symptoms occur within 6 weeks of  receiving this vaccine. You may still catch the flu, but the illness is not usually as bad. You cannot get the flu from the vaccine. The vaccine will not protect against colds or other illnesses that may cause fever. The vaccine is  needed every year. What side effects may I notice from receiving this medicine? Side effects that you should report to your doctor or health care professional as soon as possible:  allergic reactions like skin rash, itching or hives, swelling of the face, lips, or tongue Side effects that usually do not require medical attention (Report these to your doctor or health care professional if they continue or are bothersome.):  fever  headache  muscle aches and pains  pain, tenderness, redness, or swelling at the injection site  tiredness This list may not describe all possible side effects. Call your doctor for medical advice about side effects. You may report side effects to FDA at 1-800-FDA-1088. Where should I keep my medicine? The vaccine will be given by a health care professional in a clinic, pharmacy, doctor's office, or other health care setting. You will not be given vaccine doses to store at home. NOTE: This sheet is a summary. It may not cover all possible information. If you have questions about this medicine, talk to your doctor, pharmacist, or health care provider.  2020 Elsevier/Gold Standard (2011-05-10 14:06:47)

## 2019-04-10 NOTE — Progress Notes (Signed)
  [facility] OFFICE PROGRESS NOTE   Diagnosis: Rectal cancer  INTERVAL HISTORY:   [name] returns as scheduled.  He completed cycle 3 FOLFOX 03/27/2019.  He denies significant nausea/vomiting.  He had a single sore on his lip which has resolved.  No significant diarrhea.  Cold sensitivity last approximately 1 week.  No persistent neuropathy symptoms.  Objective:  Vital signs in last 24 hours:  Blood pressure (!) 146/88, pulse 74, temperature 98.3 F (36.8 C), temperature source Temporal, resp. rate 17, height 6' (1.829 m), weight 170 lb 8 oz (77.3 kg), SpO2 100 %.    HEENT: No thrush or ulcers. GI: Abdomen soft and nontender.  No hepatomegaly. Vascular: No leg edema. Neuro: Vibratory sense intact over the fingertips per tuning fork exam. Skin: Palms with hyperpigmentation, mild dryness. Port-A-Cath without erythema.   Lab Results:  Lab Results  Component Value Date   WBC 5.6 04/10/2019   HGB 13.8 04/10/2019   HCT 40.6 04/10/2019   MCV 87.5 04/10/2019   PLT 202 04/10/2019   NEUTROABS 3.3 04/10/2019    Imaging:  No results found.  Medications: I have reviewed the patient's current medications.  Assessment/Plan: 1. Rectosigmoid cancer   Colonoscopy by Dr. Benson Norway 02/04/2019-fungating infiltrative and ulcerated nonobstructing large mass in the rectum. The mass was circumferential measuring 3 cm in length. There was no bleeding present. The mass was 10 cm from the anal verge. Biopsy showed invasive adenocarcinoma, moderately differentiated; MLH1, MSH2, MSH6 and PMS2 intact.   CTs abdomen and pelvis 02/04/2019-apparent colorectal mass measuring 3.9 x 5.4 x 3.2 cm involving the distal sigmoid colon and proximal rectum with adjacent borderline enlarged suspicious mesorectal lymph nodes; indeterminate lesion in segment 6 of the liver measuring 1.4 x 1.1 cm.  MRI pelvis 02/13/2019-T3c, N1 tumor at 11.5 cm from the anal verge, 6.7 cm from the internal anal  sphincter, 3 lymph nodes greater than 5 mm, largest 8 mm  CT chest 02/13/2019-negative for metastatic disease  MRI liver 02/13/2019-3.6 cm hemangioma in the posterior right hepatic lobe, 10 mm indeterminate lesion in the posterior right hepatic lobe-potentially an atypical hemangioma-solitary metastasis not excluded  Cycle 1 FOLFOX 02/26/2019  Cycle 2 FOLFOX 03/13/2019, Emend added  Cycle 3 FOLFOX 03/27/2019  Cycle 4 FOLFOX 04/10/2019 2. Change in bowel habits/rectal pain secondary to #1 3. Port-A-Cath placement, Dr. Hassell Done, 02/21/2019  Disposition: Mr. [name] appears stable.  He has completed 3 cycles of FOLFOX.  Plan to proceed with cycle 4 today as scheduled.  We reviewed the CBC from today.  Counts adequate to proceed with treatment.  He will return for lab, follow-up, cycle 5 FOLFOX in 2 weeks.  He will contact the office in the interim with any problems.    Ned Card ANP/GNP-BC   04/10/2019  12:33 PM

## 2019-04-11 ENCOUNTER — Encounter: Payer: Self-pay | Admitting: Oncology

## 2019-04-12 ENCOUNTER — Inpatient Hospital Stay: Payer: Commercial Managed Care - PPO

## 2019-04-12 VITALS — BP 162/103 | HR 70 | Temp 98.3°F | Resp 20

## 2019-04-12 DIAGNOSIS — Z5111 Encounter for antineoplastic chemotherapy: Secondary | ICD-10-CM | POA: Diagnosis not present

## 2019-04-12 MED ORDER — SODIUM CHLORIDE 0.9% FLUSH
10.0000 mL | INTRAVENOUS | Status: DC | PRN
  Administered 2019-04-12: 10 mL
  Filled 2019-04-12: qty 10

## 2019-04-12 MED ORDER — HEPARIN SOD (PORK) LOCK FLUSH 100 UNIT/ML IV SOLN
500.0000 [IU] | Freq: Once | INTRAVENOUS | Status: AC | PRN
  Administered 2019-04-12: 500 [IU]
  Filled 2019-04-12: qty 5

## 2019-04-12 NOTE — Patient Instructions (Signed)

## 2019-04-20 ENCOUNTER — Other Ambulatory Visit: Payer: Self-pay | Admitting: Oncology

## 2019-04-24 ENCOUNTER — Inpatient Hospital Stay: Payer: Commercial Managed Care - PPO | Attending: Nurse Practitioner

## 2019-04-24 ENCOUNTER — Encounter: Payer: Self-pay | Admitting: Nurse Practitioner

## 2019-04-24 ENCOUNTER — Inpatient Hospital Stay (HOSPITAL_BASED_OUTPATIENT_CLINIC_OR_DEPARTMENT_OTHER): Payer: Commercial Managed Care - PPO | Admitting: Nurse Practitioner

## 2019-04-24 ENCOUNTER — Inpatient Hospital Stay: Payer: Commercial Managed Care - PPO

## 2019-04-24 ENCOUNTER — Other Ambulatory Visit: Payer: Self-pay

## 2019-04-24 VITALS — BP 130/98 | HR 71 | Temp 97.9°F | Resp 17 | Ht 72.0 in | Wt 169.5 lb

## 2019-04-24 DIAGNOSIS — C20 Malignant neoplasm of rectum: Secondary | ICD-10-CM | POA: Diagnosis not present

## 2019-04-24 DIAGNOSIS — C19 Malignant neoplasm of rectosigmoid junction: Secondary | ICD-10-CM | POA: Insufficient documentation

## 2019-04-24 DIAGNOSIS — Z5111 Encounter for antineoplastic chemotherapy: Secondary | ICD-10-CM | POA: Insufficient documentation

## 2019-04-24 DIAGNOSIS — G620 Drug-induced polyneuropathy: Secondary | ICD-10-CM | POA: Insufficient documentation

## 2019-04-24 DIAGNOSIS — Z95828 Presence of other vascular implants and grafts: Secondary | ICD-10-CM

## 2019-04-24 LAB — CMP (CANCER CENTER ONLY)
ALT: 175 U/L — ABNORMAL HIGH (ref 0–44)
AST: 85 U/L — ABNORMAL HIGH (ref 15–41)
Albumin: 4.2 g/dL (ref 3.5–5.0)
Alkaline Phosphatase: 86 U/L (ref 38–126)
Anion gap: 10 (ref 5–15)
BUN: 8 mg/dL (ref 6–20)
CO2: 24 mmol/L (ref 22–32)
Calcium: 9 mg/dL (ref 8.9–10.3)
Chloride: 107 mmol/L (ref 98–111)
Creatinine: 0.86 mg/dL (ref 0.61–1.24)
GFR, Est AFR Am: >60 mL/min (ref >60)
GFR, Estimated: >60 mL/min (ref >60)
Glucose, Bld: 93 mg/dL (ref 70–99)
Potassium: 3.8 mmol/L (ref 3.5–5.1)
Sodium: 141 mmol/L (ref 135–145)
Total Bilirubin: 0.7 mg/dL (ref 0.3–1.2)
Total Protein: 6.7 g/dL (ref 6.5–8.1)

## 2019-04-24 LAB — CBC WITH DIFFERENTIAL (CANCER CENTER ONLY)
Abs Immature Granulocytes: 0.01 10*3/uL (ref 0.00–0.07)
Basophils Absolute: 0 10*3/uL (ref 0.0–0.1)
Basophils Relative: 1 %
Eosinophils Absolute: 0.1 10*3/uL (ref 0.0–0.5)
Eosinophils Relative: 3 %
HCT: 41.1 % (ref 39.0–52.0)
Hemoglobin: 13.8 g/dL (ref 13.0–17.0)
Immature Granulocytes: 0 %
Lymphocytes Relative: 25 %
Lymphs Abs: 1.1 10*3/uL (ref 0.7–4.0)
MCH: 29.5 pg (ref 26.0–34.0)
MCHC: 33.6 g/dL (ref 30.0–36.0)
MCV: 87.8 fL (ref 80.0–100.0)
Monocytes Absolute: 0.7 10*3/uL (ref 0.1–1.0)
Monocytes Relative: 15 %
Neutro Abs: 2.4 10*3/uL (ref 1.7–7.7)
Neutrophils Relative %: 56 %
Platelet Count: 160 10*3/uL (ref 150–400)
RBC: 4.68 MIL/uL (ref 4.22–5.81)
RDW: 15.7 % — ABNORMAL HIGH (ref 11.5–15.5)
WBC Count: 4.3 10*3/uL (ref 4.0–10.5)
nRBC: 0 % (ref 0.0–0.2)

## 2019-04-24 MED ORDER — LORAZEPAM 0.5 MG PO TABS: 0.5000 mg | ORAL_TABLET | Freq: Three times a day (TID) | ORAL | 0 refills | Status: DC | PRN

## 2019-04-24 MED ORDER — SODIUM CHLORIDE 0.9 % IV SOLN
2400.0000 mg/m2 | INTRAVENOUS | Status: DC
  Administered 2019-04-24: 4800 mg via INTRAVENOUS
  Filled 2019-04-24: qty 96

## 2019-04-24 MED ORDER — FLUOROURACIL CHEMO INJECTION 2.5 GM/50ML
400.0000 mg/m2 | Freq: Once | INTRAVENOUS | Status: AC
  Administered 2019-04-24: 800 mg via INTRAVENOUS
  Filled 2019-04-24: qty 16

## 2019-04-24 MED ORDER — OXALIPLATIN CHEMO INJECTION 100 MG/20ML
85.0000 mg/m2 | Freq: Once | INTRAVENOUS | Status: AC
  Administered 2019-04-24: 170 mg via INTRAVENOUS
  Filled 2019-04-24: qty 34

## 2019-04-24 MED ORDER — PALONOSETRON HCL INJECTION 0.25 MG/5ML
0.2500 mg | Freq: Once | INTRAVENOUS | Status: AC
  Administered 2019-04-24: 0.25 mg via INTRAVENOUS

## 2019-04-24 MED ORDER — SODIUM CHLORIDE 0.9 % IV SOLN
Freq: Once | INTRAVENOUS | Status: AC
  Administered 2019-04-24: 14:00:00 via INTRAVENOUS
  Filled 2019-04-24: qty 5

## 2019-04-24 MED ORDER — DEXTROSE 5 % IV SOLN
Freq: Once | INTRAVENOUS | Status: AC
  Administered 2019-04-24: 13:00:00 via INTRAVENOUS
  Filled 2019-04-24: qty 250

## 2019-04-24 MED ORDER — PALONOSETRON HCL INJECTION 0.25 MG/5ML
INTRAVENOUS | Status: AC
  Filled 2019-04-24: qty 5

## 2019-04-24 MED ORDER — LEUCOVORIN CALCIUM INJECTION 350 MG
400.0000 mg/m2 | Freq: Once | INTRAVENOUS | Status: AC
  Administered 2019-04-24: 804 mg via INTRAVENOUS
  Filled 2019-04-24: qty 40.2

## 2019-04-24 MED ORDER — SODIUM CHLORIDE 0.9% FLUSH
10.0000 mL | INTRAVENOUS | Status: DC | PRN
  Administered 2019-04-24: 10 mL
  Filled 2019-04-24: qty 10

## 2019-04-24 NOTE — Patient Instructions (Signed)
[facility] Discharge Instructions for Patients Receiving Chemotherapy  Today you received the following chemotherapy agents Oxaliplatin (ELOXATIN), Leucovorin & Flourouracil (ADRUCIL).  To help prevent nausea and vomiting after your treatment, we encourage you to take your nausea medication as prescribed.   If you develop nausea and vomiting that is not controlled by your nausea medication, call the clinic.   BELOW ARE SYMPTOMS THAT SHOULD BE REPORTED IMMEDIATELY:  *FEVER GREATER THAN 100.5 F  *CHILLS WITH OR WITHOUT FEVER  NAUSEA AND VOMITING THAT IS NOT CONTROLLED WITH YOUR NAUSEA MEDICATION  *UNUSUAL SHORTNESS OF BREATH  *UNUSUAL BRUISING OR BLEEDING  TENDERNESS IN MOUTH AND THROAT WITH OR WITHOUT PRESENCE OF ULCERS  *URINARY PROBLEMS  *BOWEL PROBLEMS  UNUSUAL RASH Items with * indicate a potential emergency and should be followed up as soon as possible.  Feel free to call the clinic should you have any questions or concerns. The clinic phone number is [phone].  Please show the CHEMO ALERT CARD at check-in to the Emergency Department and triage nurse.  Coronavirus (COVID-19) Are you at risk?  Are you at risk for the Coronavirus (COVID-19)?  To be considered HIGH RISK for Coronavirus (COVID-19), you have to meet the following criteria:  . Traveled to [place], [place], [place], [place] or [place]; or in the [place] to [place], [place], [place], or [place]; and have fever, cough, and shortness of breath within the last 2 weeks of travel OR . Been in close contact with a person diagnosed with COVID-19 within the last 2 weeks and have fever, cough, and shortness of breath . IF YOU DO NOT MEET THESE CRITERIA, YOU ARE CONSIDERED LOW RISK FOR COVID-19.  What to do if you are HIGH RISK for COVID-19?  . If you are having a medical emergency, call 911. . Seek medical care right away. Before you go to a doctor's office, urgent care or emergency  department, call ahead and tell them about your recent travel, contact with someone diagnosed with COVID-19, and your symptoms. You should receive instructions from your physician's office regarding next steps of care.  . When you arrive at healthcare provider, tell the healthcare staff immediately you have returned from visiting [place], [place], [place], [place] or [place]; or traveled in the [place] to [place], [place], [place], or [place]; in the last two weeks or you have been in close contact with a person diagnosed with COVID-19 in the last 2 weeks.   . Tell the health care staff about your symptoms: fever, cough and shortness of breath. . After you have been seen by a medical provider, you will be either: o Tested for (COVID-19) and discharged home on quarantine except to seek medical care if symptoms worsen, and asked to  - Stay home and avoid contact with others until you get your results (4-5 days)  - Avoid travel on public transportation if possible (such as bus, train, or airplane) or o Sent to the Emergency Department by EMS for evaluation, COVID-19 testing, and possible admission depending on your condition and test results.  What to do if you are LOW RISK for COVID-19?  Reduce your risk of any infection by using the same precautions used for avoiding the common cold or flu:  . Wash your hands often with soap and warm water for at least 20 seconds.  If soap and water are not readily available, use an alcohol-based hand sanitizer with at least 60% alcohol.  .   If coughing or sneezing, cover your mouth and nose by coughing or sneezing into the elbow areas of your shirt or coat, into a tissue or into your sleeve (not your hands). . Avoid shaking hands with others and consider head nods or verbal greetings only. . Avoid touching your eyes, nose, or mouth with unwashed hands.  . Avoid close contact with people who are sick. . Avoid places or events with large numbers of people  in one location, like concerts or sporting events. . Carefully consider travel plans you have or are making. . If you are planning any travel outside or inside the [place], visit the CDC's Travelers' Health webpage for the latest health notices. . If you have some symptoms but not all symptoms, continue to monitor at home and seek medical attention if your symptoms worsen. . If you are having a medical emergency, call 911.   [place] / e-Visit: [email or web address]         [facility]: [facility]: [phone]                   [facility]: [phone]

## 2019-04-24 NOTE — Progress Notes (Signed)
Verbal order from Dr. Benay Spice: okay to D/C pump on Saturday 04/27/2019 at 1:30 PM. Patient informed and verbalized understanding. Promised to be here for pump D/C before 2:00 PM.

## 2019-04-24 NOTE — Progress Notes (Signed)
  [facility] OFFICE PROGRESS NOTE   Diagnosis: Rectal cancer  INTERVAL HISTORY:   Brandon Barry returns as scheduled.  He completed cycle 4 FOLFOX 04/10/2019.  He had a few episodes of mild nausea.  No vomiting.  He had a few mouth sores which have resolved.  He has periodic loose stools.  He has very mild cold sensitivity with prolonged exposure.  Objective:  Vital signs in last 24 hours:  Blood pressure (!) 130/98, pulse 71, temperature 97.9 F (36.6 C), temperature source Temporal, resp. rate 17, height 6' (1.829 m), weight 169 lb 8 oz (76.9 kg), SpO2 100 %.    HEENT: No thrush or ulcers. GI: Abdomen soft and nontender.  No hepatomegaly. Vascular: No leg edema. Neuro: Vibratory sense intact over the fingertips per tuning fork exam. Skin: Palms with hyperpigmentation, mild erythema, dryness. Port-A-Cath without erythema.   Lab Results:  Lab Results  Component Value Date   WBC 4.3 04/24/2019   HGB 13.8 04/24/2019   HCT 41.1 04/24/2019   MCV 87.8 04/24/2019   PLT 160 04/24/2019   NEUTROABS 2.4 04/24/2019    Imaging:  No results found.  Medications: I have reviewed the patient's current medications.  Assessment/Plan: 1. Rectosigmoid cancer   Colonoscopy by Dr. Benson Barry 02/04/2019-fungating infiltrative and ulcerated nonobstructing large mass in the rectum. The mass was circumferential measuring 3 cm in length. There was no bleeding present. The mass was 10 cm from the anal verge. Biopsy showed invasive adenocarcinoma, moderately differentiated; MLH1, MSH2, MSH6 and PMS2 intact.   CTs abdomen and pelvis 02/04/2019-apparent colorectal mass measuring 3.9 x 5.4 x 3.2 cm involving the distal sigmoid colon and proximal rectum with adjacent borderline enlarged suspicious mesorectal lymph nodes; indeterminate lesion in segment 6 of the liver measuring 1.4 x 1.1 cm.  MRI pelvis 02/13/2019-T3c, N1 tumor at 11.5 cm from the anal verge, 6.7 cm from the internal anal  sphincter, 3 lymph nodes greater than 5 mm, largest 8 mm  CT chest 02/13/2019-negative for metastatic disease  MRI liver 02/13/2019-3.6 cm hemangioma in the posterior right hepatic lobe, 10 mm indeterminate lesion in the posterior right hepatic lobe-potentially an atypical hemangioma-solitary metastasis not excluded  Cycle 1 FOLFOX 02/26/2019  Cycle 2 FOLFOX 03/13/2019, Emend added  Cycle 3 FOLFOX 03/27/2019  Cycle 4 FOLFOX 04/10/2019  Cycle 5 FOLFOX 04/24/2019 2. Change in bowel habits/rectal pain secondary to #1 3. Port-A-Cath placement, Dr. Hassell Barry, 02/21/2019  Disposition: [name] appears stable.  He has completed 4 cycles of FOLFOX.  Plan to proceed with cycle 5 today as scheduled.  We reviewed the labs from today.  Counts are adequate to proceed with treatment.  He has mild elevation of the transaminases.  This is likely related to oxaliplatin.  We will continue to monitor.  He will return for lab, follow-up, cycle 6 FOLFOX in 2 weeks.  He will contact the office in the interim with any problems.  Plan reviewed with Dr. Benay Barry.    Brandon Barry ANP/GNP-BC   04/24/2019  12:49 PM

## 2019-04-24 NOTE — Progress Notes (Signed)
Verbal order per Lattie Haw, NP: okay to treat with elevated liver enzymes.

## 2019-04-26 ENCOUNTER — Inpatient Hospital Stay: Payer: Commercial Managed Care - PPO

## 2019-04-26 VITALS — BP 153/106 | HR 69 | Temp 98.9°F | Resp 18

## 2019-04-26 DIAGNOSIS — Z5111 Encounter for antineoplastic chemotherapy: Secondary | ICD-10-CM | POA: Diagnosis not present

## 2019-04-26 DIAGNOSIS — C20 Malignant neoplasm of rectum: Secondary | ICD-10-CM

## 2019-04-26 MED ORDER — SODIUM CHLORIDE 0.9% FLUSH
10.0000 mL | INTRAVENOUS | Status: DC | PRN
  Administered 2019-04-26: 10 mL
  Filled 2019-04-26: qty 10

## 2019-04-26 MED ORDER — HEPARIN SOD (PORK) LOCK FLUSH 100 UNIT/ML IV SOLN
500.0000 [IU] | Freq: Once | INTRAVENOUS | Status: AC | PRN
  Administered 2019-04-26: 500 [IU]
  Filled 2019-04-26: qty 5

## 2019-05-04 ENCOUNTER — Other Ambulatory Visit: Payer: Self-pay | Admitting: Oncology

## 2019-05-07 ENCOUNTER — Inpatient Hospital Stay: Payer: Commercial Managed Care - PPO

## 2019-05-07 ENCOUNTER — Other Ambulatory Visit: Payer: Self-pay

## 2019-05-07 ENCOUNTER — Inpatient Hospital Stay (HOSPITAL_BASED_OUTPATIENT_CLINIC_OR_DEPARTMENT_OTHER): Payer: Commercial Managed Care - PPO | Admitting: Oncology

## 2019-05-07 VITALS — BP 153/100 | HR 66 | Temp 98.5°F | Resp 16 | Ht 72.0 in | Wt 167.6 lb

## 2019-05-07 VITALS — BP 150/104 | HR 65

## 2019-05-07 DIAGNOSIS — C20 Malignant neoplasm of rectum: Secondary | ICD-10-CM | POA: Diagnosis not present

## 2019-05-07 DIAGNOSIS — Z5111 Encounter for antineoplastic chemotherapy: Secondary | ICD-10-CM | POA: Diagnosis not present

## 2019-05-07 DIAGNOSIS — Z95828 Presence of other vascular implants and grafts: Secondary | ICD-10-CM

## 2019-05-07 LAB — CBC WITH DIFFERENTIAL (CANCER CENTER ONLY)
Abs Immature Granulocytes: 0.03 10*3/uL (ref 0.00–0.07)
Basophils Absolute: 0 10*3/uL (ref 0.0–0.1)
Basophils Relative: 1 %
Eosinophils Absolute: 0.4 10*3/uL (ref 0.0–0.5)
Eosinophils Relative: 8 %
HCT: 41.1 % (ref 39.0–52.0)
Hemoglobin: 13.9 g/dL (ref 13.0–17.0)
Immature Granulocytes: 1 %
Lymphocytes Relative: 26 %
Lymphs Abs: 1.1 10*3/uL (ref 0.7–4.0)
MCH: 30 pg (ref 26.0–34.0)
MCHC: 33.8 g/dL (ref 30.0–36.0)
MCV: 88.6 fL (ref 80.0–100.0)
Monocytes Absolute: 0.8 10*3/uL (ref 0.1–1.0)
Monocytes Relative: 19 %
Neutro Abs: 1.9 10*3/uL (ref 1.7–7.7)
Neutrophils Relative %: 45 %
Platelet Count: 152 10*3/uL (ref 150–400)
RBC: 4.64 MIL/uL (ref 4.22–5.81)
RDW: 16.5 % — ABNORMAL HIGH (ref 11.5–15.5)
WBC Count: 4.2 10*3/uL (ref 4.0–10.5)
nRBC: 0 % (ref 0.0–0.2)

## 2019-05-07 LAB — CMP (CANCER CENTER ONLY)
ALT: 134 U/L — ABNORMAL HIGH (ref 0–44)
AST: 69 U/L — ABNORMAL HIGH (ref 15–41)
Albumin: 3.9 g/dL (ref 3.5–5.0)
Alkaline Phosphatase: 89 U/L (ref 38–126)
Anion gap: 9 (ref 5–15)
BUN: 8 mg/dL (ref 6–20)
CO2: 24 mmol/L (ref 22–32)
Calcium: 9.1 mg/dL (ref 8.9–10.3)
Chloride: 107 mmol/L (ref 98–111)
Creatinine: 0.84 mg/dL (ref 0.61–1.24)
GFR, Est AFR Am: >60 mL/min (ref >60)
GFR, Estimated: >60 mL/min (ref >60)
Glucose, Bld: 98 mg/dL (ref 70–99)
Potassium: 4 mmol/L (ref 3.5–5.1)
Sodium: 140 mmol/L (ref 135–145)
Total Bilirubin: 0.9 mg/dL (ref 0.3–1.2)
Total Protein: 6.5 g/dL (ref 6.5–8.1)

## 2019-05-07 MED ORDER — SODIUM CHLORIDE 0.9% FLUSH
10.0000 mL | INTRAVENOUS | Status: DC | PRN
  Administered 2019-05-07: 10:00:00 10 mL
  Filled 2019-05-07: qty 10

## 2019-05-07 MED ORDER — DEXTROSE 5 % IV SOLN
Freq: Once | INTRAVENOUS | Status: AC
  Administered 2019-05-07: 12:00:00 via INTRAVENOUS
  Filled 2019-05-07: qty 250

## 2019-05-07 MED ORDER — FLUOROURACIL CHEMO INJECTION 2.5 GM/50ML
400.0000 mg/m2 | Freq: Once | INTRAVENOUS | Status: AC
  Administered 2019-05-07: 800 mg via INTRAVENOUS
  Filled 2019-05-07: qty 16

## 2019-05-07 MED ORDER — PANTOPRAZOLE SODIUM 40 MG PO TBEC: 40.0000 mg | DELAYED_RELEASE_TABLET | Freq: Every day | ORAL | 2 refills | Status: DC

## 2019-05-07 MED ORDER — OXALIPLATIN CHEMO INJECTION 100 MG/20ML
86.0000 mg/m2 | Freq: Once | INTRAVENOUS | Status: AC
  Administered 2019-05-07: 170 mg via INTRAVENOUS
  Filled 2019-05-07: qty 34

## 2019-05-07 MED ORDER — PALONOSETRON HCL INJECTION 0.25 MG/5ML
INTRAVENOUS | Status: AC
  Filled 2019-05-07: qty 5

## 2019-05-07 MED ORDER — SODIUM CHLORIDE 0.9% FLUSH
10.0000 mL | INTRAVENOUS | Status: DC | PRN
  Filled 2019-05-07: qty 10

## 2019-05-07 MED ORDER — HEPARIN SOD (PORK) LOCK FLUSH 100 UNIT/ML IV SOLN
500.0000 [IU] | Freq: Once | INTRAVENOUS | Status: DC | PRN
  Filled 2019-05-07: qty 5

## 2019-05-07 MED ORDER — PALONOSETRON HCL INJECTION 0.25 MG/5ML
0.2500 mg | Freq: Once | INTRAVENOUS | Status: AC
  Administered 2019-05-07: 0.25 mg via INTRAVENOUS

## 2019-05-07 MED ORDER — SODIUM CHLORIDE 0.9 % IV SOLN
2440.0000 mg/m2 | INTRAVENOUS | Status: DC
  Administered 2019-05-07: 4800 mg via INTRAVENOUS
  Filled 2019-05-07: qty 96

## 2019-05-07 MED ORDER — SODIUM CHLORIDE 0.9 % IV SOLN
Freq: Once | INTRAVENOUS | Status: AC
  Administered 2019-05-07: 12:00:00 via INTRAVENOUS
  Filled 2019-05-07: qty 5

## 2019-05-07 MED ORDER — LEUCOVORIN CALCIUM INJECTION 350 MG
410.0000 mg/m2 | Freq: Once | INTRAVENOUS | Status: AC
  Administered 2019-05-07: 804 mg via INTRAVENOUS
  Filled 2019-05-07: qty 40.2

## 2019-05-07 NOTE — Patient Instructions (Signed)
[facility] Discharge Instructions for Patients Receiving Chemotherapy  Today you received the following chemotherapy agents: Oxaliplatin/Leucovorin/Adrucil.  To help prevent nausea and vomiting after your treatment, we encourage you to take your nausea medication as directed.    If you develop nausea and vomiting that is not controlled by your nausea medication, call the clinic.   BELOW ARE SYMPTOMS THAT SHOULD BE REPORTED IMMEDIATELY:  *FEVER GREATER THAN 100.5 F  *CHILLS WITH OR WITHOUT FEVER  NAUSEA AND VOMITING THAT IS NOT CONTROLLED WITH YOUR NAUSEA MEDICATION  *UNUSUAL SHORTNESS OF BREATH  *UNUSUAL BRUISING OR BLEEDING  TENDERNESS IN MOUTH AND THROAT WITH OR WITHOUT PRESENCE OF ULCERS  *URINARY PROBLEMS  *BOWEL PROBLEMS  UNUSUAL RASH Items with * indicate a potential emergency and should be followed up as soon as possible.  Feel free to call the clinic should you have any questions or concerns. The clinic phone number is [phone].  Please show the CHEMO ALERT CARD at check-in to the Emergency Department and triage nurse.   

## 2019-05-07 NOTE — Progress Notes (Signed)
[facility] OFFICE PROGRESS NOTE   Diagnosis: Rectal cancer  INTERVAL HISTORY:   [name] completed another cycle of FOLFOX on 04/24/2019.  No nausea/vomiting, mouth sores, or diarrhea following chemotherapy.  He noted prolonged cold sensitivity following this cycle.  No neuropathy symptoms at present.  He has dryness and hyperpigmentation of the hands and feet.  No pain.  He reports "heartburn" for the past few weeks.  The heartburn is frequently present and not associated with meals.  Tums did not help. Rectal pain remains improved.  Objective:  Vital signs in last 24 hours:  Blood pressure (!) 153/100, pulse 66, temperature 98.5 F (36.9 C), temperature source Temporal, resp. rate 16, height 6' (1.829 m), weight 167 lb 9.6 oz (76 kg), SpO2 100 %.    Resp: Lungs clear bilaterally Cardio: Regular rate and rhythm GI: No hepatomegaly, nontender, no mass Vascular: No leg edema Neuro: Mild loss of vibratory sense at the fingertips bilaterally Skin: Dryness and hyperpigmentation of the hands Musculoskeletal: No xiphoid or upper abdominal tenderness  Portacath/PICC-without erythema  Lab Results:  Lab Results  Component Value Date   WBC 4.2 05/07/2019   HGB 13.9 05/07/2019   HCT 41.1 05/07/2019   MCV 88.6 05/07/2019   PLT 152 05/07/2019   NEUTROABS 1.9 05/07/2019    CMP  Lab Results  Component Value Date   NA 140 05/07/2019   K 4.0 05/07/2019   CL 107 05/07/2019   CO2 24 05/07/2019   GLUCOSE 98 05/07/2019   BUN 8 05/07/2019   CREATININE 0.84 05/07/2019   CALCIUM 9.1 05/07/2019   PROT 6.5 05/07/2019   ALBUMIN 3.9 05/07/2019   AST 69 (H) 05/07/2019   ALT 134 (H) 05/07/2019   ALKPHOS 89 05/07/2019   BILITOT 0.9 05/07/2019   GFRNONAA >60 05/07/2019   GFRAA >60 05/07/2019    Lab Results  Component Value Date   CEA1 4.36 02/19/2019    Medications: I have reviewed the patient's current medications.   Assessment/Plan: 1. Rectosigmoid cancer    Colonoscopy by Dr. Benson Barry 02/04/2019-fungating infiltrative and ulcerated nonobstructing large mass in the rectum. The mass was circumferential measuring 3 cm in length. There was no bleeding present. The mass was 10 cm from the anal verge. Biopsy showed invasive adenocarcinoma, moderately differentiated; MLH1, MSH2, MSH6 and PMS2 intact.   CTs abdomen and pelvis 02/04/2019-apparent colorectal mass measuring 3.9 x 5.4 x 3.2 cm involving the distal sigmoid colon and proximal rectum with adjacent borderline enlarged suspicious mesorectal lymph nodes; indeterminate lesion in segment 6 of the liver measuring 1.4 x 1.1 cm.  MRI pelvis 02/13/2019-T3c, N1 tumor at 11.5 cm from the anal verge, 6.7 cm from the internal anal sphincter, 3 lymph nodes greater than 5 mm, largest 8 mm  CT chest 02/13/2019-negative for metastatic disease  MRI liver 02/13/2019-3.6 cm hemangioma in the posterior right hepatic lobe, 10 mm indeterminate lesion in the posterior right hepatic lobe-potentially an atypical hemangioma-solitary metastasis not excluded  Cycle 1 FOLFOX 02/26/2019  Cycle 2 FOLFOX 03/13/2019, Emend added  Cycle 3 FOLFOX 03/27/2019  Cycle 4 FOLFOX 04/10/2019  Cycle 5 FOLFOX 04/24/2019  Cycle 6 FOLFOX 05/07/2019 2. Change in bowel habits/rectal pain secondary to #1, improved 3. Port-A-Cath placement, Dr. Hassell Done, 02/21/2019 4. Early oxaliplatin neuropathy    Disposition: [name] has completed 5 cycles of FOLFOX.  He is tolerating the chemotherapy well.  The rectal symptoms have improved.  He has mild oxaliplatin neuropathy.  The plan is to proceed with cycle  6 FOLFOX today.  [name] will begin a trial of this for the "heartburn".  He will return for an office visit and chemotherapy in 2 weeks.  His blood pressure has been elevated over the past month.  We will repeat the blood pressure while he is in the chemotherapy room today.  Betsy Coder, MD  05/07/2019  11:15 AM

## 2019-05-07 NOTE — Patient Instructions (Signed)

## 2019-05-07 NOTE — Progress Notes (Signed)
Per Dr. Benay Spice, okay to treat with elevated BP.

## 2019-05-07 NOTE — Progress Notes (Signed)
Dr. Benay Spice reviewed CBC/CMP today: OK to treat.

## 2019-05-09 ENCOUNTER — Telehealth: Payer: Self-pay | Admitting: Oncology

## 2019-05-09 ENCOUNTER — Inpatient Hospital Stay: Payer: Commercial Managed Care - PPO

## 2019-05-09 ENCOUNTER — Other Ambulatory Visit: Payer: Self-pay

## 2019-05-09 VITALS — BP 140/91 | HR 66 | Temp 98.3°F | Resp 20

## 2019-05-09 DIAGNOSIS — C20 Malignant neoplasm of rectum: Secondary | ICD-10-CM

## 2019-05-09 DIAGNOSIS — Z5111 Encounter for antineoplastic chemotherapy: Secondary | ICD-10-CM | POA: Diagnosis not present

## 2019-05-09 MED ORDER — HEPARIN SOD (PORK) LOCK FLUSH 100 UNIT/ML IV SOLN
500.0000 [IU] | Freq: Once | INTRAVENOUS | Status: AC | PRN
  Administered 2019-05-09: 13:00:00 500 [IU]
  Filled 2019-05-09: qty 5

## 2019-05-09 MED ORDER — SODIUM CHLORIDE 0.9% FLUSH
10.0000 mL | INTRAVENOUS | Status: DC | PRN
  Administered 2019-05-09: 10 mL
  Filled 2019-05-09: qty 10

## 2019-05-09 NOTE — Patient Instructions (Signed)

## 2019-05-09 NOTE — Progress Notes (Signed)
Patient did not have a biopatch in place when he was de accessed.

## 2019-05-09 NOTE — Telephone Encounter (Signed)
Scheduled per los. Called and left msg. Mailed printout  °

## 2019-05-18 ENCOUNTER — Other Ambulatory Visit: Payer: Self-pay | Admitting: Oncology

## 2019-05-22 ENCOUNTER — Inpatient Hospital Stay: Payer: Commercial Managed Care - PPO

## 2019-05-22 ENCOUNTER — Inpatient Hospital Stay: Payer: Commercial Managed Care - PPO | Attending: Nurse Practitioner | Admitting: Oncology

## 2019-05-22 ENCOUNTER — Other Ambulatory Visit: Payer: Self-pay

## 2019-05-22 VITALS — BP 136/91 | HR 85 | Temp 97.8°F | Resp 17 | Ht 72.0 in | Wt 161.9 lb

## 2019-05-22 DIAGNOSIS — Z5111 Encounter for antineoplastic chemotherapy: Secondary | ICD-10-CM | POA: Diagnosis present

## 2019-05-22 DIAGNOSIS — C19 Malignant neoplasm of rectosigmoid junction: Secondary | ICD-10-CM | POA: Insufficient documentation

## 2019-05-22 DIAGNOSIS — Z95828 Presence of other vascular implants and grafts: Secondary | ICD-10-CM

## 2019-05-22 DIAGNOSIS — G620 Drug-induced polyneuropathy: Secondary | ICD-10-CM | POA: Diagnosis not present

## 2019-05-22 DIAGNOSIS — C20 Malignant neoplasm of rectum: Secondary | ICD-10-CM | POA: Diagnosis not present

## 2019-05-22 LAB — CBC WITH DIFFERENTIAL (CANCER CENTER ONLY)
Abs Immature Granulocytes: 0.02 10*3/uL (ref 0.00–0.07)
Basophils Absolute: 0 10*3/uL (ref 0.0–0.1)
Basophils Relative: 1 %
Eosinophils Absolute: 0.3 10*3/uL (ref 0.0–0.5)
Eosinophils Relative: 5 %
HCT: 38.5 % — ABNORMAL LOW (ref 39.0–52.0)
Hemoglobin: 13.1 g/dL (ref 13.0–17.0)
Immature Granulocytes: 0 %
Lymphocytes Relative: 16 %
Lymphs Abs: 0.9 10*3/uL (ref 0.7–4.0)
MCH: 30.1 pg (ref 26.0–34.0)
MCHC: 34 g/dL (ref 30.0–36.0)
MCV: 88.5 fL (ref 80.0–100.0)
Monocytes Absolute: 0.8 10*3/uL (ref 0.1–1.0)
Monocytes Relative: 14 %
Neutro Abs: 3.7 10*3/uL (ref 1.7–7.7)
Neutrophils Relative %: 64 %
Platelet Count: 126 10*3/uL — ABNORMAL LOW (ref 150–400)
RBC: 4.35 MIL/uL (ref 4.22–5.81)
RDW: 16.3 % — ABNORMAL HIGH (ref 11.5–15.5)
WBC Count: 5.8 10*3/uL (ref 4.0–10.5)
nRBC: 0 % (ref 0.0–0.2)

## 2019-05-22 LAB — CMP (CANCER CENTER ONLY)
ALT: 68 U/L — ABNORMAL HIGH (ref 0–44)
AST: 42 U/L — ABNORMAL HIGH (ref 15–41)
Albumin: 3.4 g/dL — ABNORMAL LOW (ref 3.5–5.0)
Alkaline Phosphatase: 89 U/L (ref 38–126)
Anion gap: 9 (ref 5–15)
BUN: 7 mg/dL (ref 6–20)
CO2: 26 mmol/L (ref 22–32)
Calcium: 8.5 mg/dL — ABNORMAL LOW (ref 8.9–10.3)
Chloride: 106 mmol/L (ref 98–111)
Creatinine: 0.8 mg/dL (ref 0.61–1.24)
GFR, Est AFR Am: >60 mL/min (ref >60)
GFR, Estimated: >60 mL/min (ref >60)
Glucose, Bld: 125 mg/dL — ABNORMAL HIGH (ref 70–99)
Potassium: 3.3 mmol/L — ABNORMAL LOW (ref 3.5–5.1)
Sodium: 141 mmol/L (ref 135–145)
Total Bilirubin: 1 mg/dL (ref 0.3–1.2)
Total Protein: 5.7 g/dL — ABNORMAL LOW (ref 6.5–8.1)

## 2019-05-22 MED ORDER — PALONOSETRON HCL INJECTION 0.25 MG/5ML
INTRAVENOUS | Status: AC
  Filled 2019-05-22: qty 5

## 2019-05-22 MED ORDER — FLUOROURACIL CHEMO INJECTION 500 MG/10ML
200.0000 mg/m2 | Freq: Once | INTRAVENOUS | Status: AC
  Administered 2019-05-22: 400 mg via INTRAVENOUS
  Filled 2019-05-22: qty 8

## 2019-05-22 MED ORDER — DEXTROSE 5 % IV SOLN
Freq: Once | INTRAVENOUS | Status: AC
  Administered 2019-05-22: 10:00:00 via INTRAVENOUS
  Filled 2019-05-22: qty 250

## 2019-05-22 MED ORDER — SODIUM CHLORIDE 0.9 % IV SOLN
Freq: Once | INTRAVENOUS | Status: AC
  Administered 2019-05-22: 10:00:00 via INTRAVENOUS
  Filled 2019-05-22: qty 5

## 2019-05-22 MED ORDER — SODIUM CHLORIDE 0.9 % IV SOLN
1800.0000 mg/m2 | INTRAVENOUS | Status: DC
  Administered 2019-05-22: 3450 mg via INTRAVENOUS
  Filled 2019-05-22: qty 69

## 2019-05-22 MED ORDER — SODIUM CHLORIDE 0.9% FLUSH
10.0000 mL | INTRAVENOUS | Status: DC | PRN
  Administered 2019-05-22: 10 mL
  Filled 2019-05-22: qty 10

## 2019-05-22 MED ORDER — OXALIPLATIN CHEMO INJECTION 100 MG/20ML
85.0000 mg/m2 | Freq: Once | INTRAVENOUS | Status: AC
  Administered 2019-05-22: 165 mg via INTRAVENOUS
  Filled 2019-05-22: qty 33

## 2019-05-22 MED ORDER — PALONOSETRON HCL INJECTION 0.25 MG/5ML
0.2500 mg | Freq: Once | INTRAVENOUS | Status: AC
  Administered 2019-05-22: 0.25 mg via INTRAVENOUS

## 2019-05-22 MED ORDER — LEUCOVORIN CALCIUM INJECTION 350 MG
200.0000 mg/m2 | Freq: Once | INTRAVENOUS | Status: AC
  Administered 2019-05-22: 386 mg via INTRAVENOUS
  Filled 2019-05-22: qty 19.3

## 2019-05-22 NOTE — Patient Instructions (Signed)
[facility] Discharge Instructions for Patients Receiving Chemotherapy  Today you received the following chemotherapy agents Oxaliplatin (ELOXATIN), Leucovorin & Flourouracil (ADRUCIL).  To help prevent nausea and vomiting after your treatment, we encourage you to take your nausea medication as prescribed.  If you develop nausea and vomiting that is not controlled by your nausea medication, call the clinic.   BELOW ARE SYMPTOMS THAT SHOULD BE REPORTED IMMEDIATELY:  *FEVER GREATER THAN 100.5 F  *CHILLS WITH OR WITHOUT FEVER  NAUSEA AND VOMITING THAT IS NOT CONTROLLED WITH YOUR NAUSEA MEDICATION  *UNUSUAL SHORTNESS OF BREATH  *UNUSUAL BRUISING OR BLEEDING  TENDERNESS IN MOUTH AND THROAT WITH OR WITHOUT PRESENCE OF ULCERS  *URINARY PROBLEMS  *BOWEL PROBLEMS  UNUSUAL RASH Items with * indicate a potential emergency and should be followed up as soon as possible.  Feel free to call the clinic should you have any questions or concerns. The clinic phone number is [phone].  Please show the [facility] at check-in to the Emergency Department and triage nurse.  Coronavirus (COVID-19) Are you at risk?  Are you at risk for the Coronavirus (COVID-19)?  To be considered HIGH RISK for Coronavirus (COVID-19), you have to meet the following criteria:  . Traveled to [place], [place], [place], [place] or [place]; or in the [place] to [place], [place], [place], or [place]; and have fever, cough, and shortness of breath within the last 2 weeks of travel OR . Been in close contact with a person diagnosed with COVID-19 within the last 2 weeks and have fever, cough, and shortness of breath . IF YOU DO NOT MEET THESE CRITERIA, YOU ARE CONSIDERED LOW RISK FOR COVID-19.  What to do if you are HIGH RISK for COVID-19?  Marland Kitchen If you are having a medical emergency, call 911. . Seek medical care right away. Before you go to a doctor's office, urgent care or emergency  department, call ahead and tell them about your recent travel, contact with someone diagnosed with COVID-19, and your symptoms. You should receive instructions from your physician's office regarding next steps of care.  . When you arrive at healthcare provider, tell the healthcare staff immediately you have returned from visiting [place], [place], [place], [place] or [place]; or traveled in the [place] to [place], [place], [place], or [place]; in the last two weeks or you have been in close contact with a person diagnosed with COVID-19 in the last 2 weeks.   . Tell the health care staff about your symptoms: fever, cough and shortness of breath. . After you have been seen by a medical provider, you will be either: o Tested for (COVID-19) and discharged home on quarantine except to seek medical care if symptoms worsen, and asked to  - Stay home and avoid contact with others until you get your results (4-5 days)  - Avoid travel on public transportation if possible (such as bus, train, or airplane) or o Sent to the Emergency Department by EMS for evaluation, COVID-19 testing, and possible admission depending on your condition and test results.  What to do if you are LOW RISK for COVID-19?  Reduce your risk of any infection by using the same precautions used for avoiding the common cold or flu:  Marland Kitchen Wash your hands often with soap and warm water for at least 20 seconds.  If soap and water are not readily available, use an alcohol-based hand sanitizer with at least 60% alcohol.  Marland Kitchen  If coughing or sneezing, cover your mouth and nose by coughing or sneezing into the elbow areas of your shirt or coat, into a tissue or into your sleeve (not your hands). . Avoid shaking hands with others and consider head nods or verbal greetings only. . Avoid touching your eyes, nose, or mouth with unwashed hands.  . Avoid close contact with people who are sick. . Avoid places or events with large numbers of people  in one location, like concerts or sporting events. . Carefully consider travel plans you have or are making. . If you are planning any travel outside or inside the [place], visit the CDC's Travelers' Health webpage for the latest health notices. . If you have some symptoms but not all symptoms, continue to monitor at home and seek medical attention if your symptoms worsen. . If you are having a medical emergency, call 911.   ADDITIONAL HEALTHCARE OPTIONS FOR PATIENTS  [facility] / e-Visit: [email or web address]         [facility]: [phone]  [facility]: [phone]                   [facility]: [phone]   

## 2019-05-22 NOTE — Progress Notes (Signed)
[facility] OFFICE PROGRESS NOTE   Diagnosis: Rectal Cancer  INTERVAL HISTORY:   Brandon Barry completed another cycle of FOLFOX on 05/07/2019.  He reports increased fatigue following chemotherapy.  No nausea or vomiting.  No diarrhea.  He has cold sensitivity following chemotherapy.  No neuropathy symptoms at present.  He has noted increased tearing.  This is intermittent.  He has developed "sores "over the penis.  He has not experienced this in the past.  He had pain at the palms and soles for a few days following chemotherapy.  There continues to be dryness of the hands and feet.  Objective:  Vital signs in last 24 hours:  Blood pressure (!) 136/91, pulse 85, temperature 97.8 F (36.6 C), temperature source Temporal, resp. rate 17, height 6' (1.829 m), weight 161 lb 14.4 oz (73.4 kg), SpO2 100 %.    HEENT: Conjunctiva without erythema  GI: No hepatomegaly, nontender Vascular: No leg edema Neuro: Mild loss of vibratory sense of the fingertips bilaterally Skin: Dry superficial desquamation of the palms and soles with hyperpigmentation.  There is erythema at the tip of the penis and erythema with dryness and superficial desquamation at the distal shaft/glans-no discrete ulcers  Portacath/PICC-without erythema  Lab Results:  Lab Results  Component Value Date   WBC 4.2 05/07/2019   HGB 13.9 05/07/2019   HCT 41.1 05/07/2019   MCV 88.6 05/07/2019   PLT 152 05/07/2019   NEUTROABS 1.9 05/07/2019    CMP  Lab Results  Component Value Date   NA 140 05/07/2019   K 4.0 05/07/2019   CL 107 05/07/2019   CO2 24 05/07/2019   GLUCOSE 98 05/07/2019   BUN 8 05/07/2019   CREATININE 0.84 05/07/2019   CALCIUM 9.1 05/07/2019   PROT 6.5 05/07/2019   ALBUMIN 3.9 05/07/2019   AST 69 (H) 05/07/2019   ALT 134 (H) 05/07/2019   ALKPHOS 89 05/07/2019   BILITOT 0.9 05/07/2019   GFRNONAA >60 05/07/2019   GFRAA >60 05/07/2019    Lab Results  Component Value Date   CEA1 4.36  02/19/2019    Medications: I have reviewed the patient's current medications.   Assessment/Plan: 1. Rectosigmoid cancer   Colonoscopy by Dr. Benson Norway 02/04/2019-fungating infiltrative and ulcerated nonobstructing large mass in the rectum. The mass was circumferential measuring 3 cm in length. There was no bleeding present. The mass was 10 cm from the anal verge. Biopsy showed invasive adenocarcinoma, moderately differentiated; MLH1, MSH2, MSH6 and PMS2 intact.   CTs abdomen and pelvis 02/04/2019-apparent colorectal mass measuring 3.9 x 5.4 x 3.2 cm involving the distal sigmoid colon and proximal rectum with adjacent borderline enlarged suspicious mesorectal lymph nodes; indeterminate lesion in segment 6 of the liver measuring 1.4 x 1.1 cm.  MRI pelvis 02/13/2019-T3c, N1 tumor at 11.5 cm from the anal verge, 6.7 cm from the internal anal sphincter, 3 lymph nodes greater than 5 mm, largest 8 mm  CT chest 02/13/2019-negative for metastatic disease  MRI liver 02/13/2019-3.6 cm hemangioma in the posterior right hepatic lobe, 10 mm indeterminate lesion in the posterior right hepatic lobe-potentially an atypical hemangioma-solitary metastasis not excluded  Cycle 1 FOLFOX 02/26/2019  Cycle 2 FOLFOX 03/13/2019, Emend added  Cycle 3 FOLFOX 03/27/2019  Cycle 4 FOLFOX 04/10/2019  Cycle 5 FOLFOX 04/24/2019  Cycle 6 FOLFOX 05/07/2019   Cycle 7 FOLFOX 05/22/2019 (5-fluorouracil dose reduced) 2. Change in bowel habits/rectal pain secondary to #1, improved 3. Port-A-Cath placement, Dr. Hassell Done, 02/21/2019 4. Early oxaliplatin neuropathy  Disposition: Brandon Barry has completed 6 cycles of FOLFOX.  He is developing hand/foot syndrome secondary to 5-fluorouracil.  The increased tearing and skin changes of the penis are also likely related to the 5-fluorouracil.  He will use moisturizers.  The 5-fluorouracil will be dose reduced beginning with chemotherapy today.  Brandon Barry has mild oxaliplatin neuropathy,  not interfering with activity.  He will complete cycle 7 FOLFOX today.  Brandon Barry will return for an office visit and cycle 8 FOLFOX on 06/09/2019.  Betsy Coder, MD  05/22/2019  9:07 AM

## 2019-05-24 ENCOUNTER — Inpatient Hospital Stay: Payer: Commercial Managed Care - PPO

## 2019-05-24 ENCOUNTER — Other Ambulatory Visit: Payer: Self-pay

## 2019-05-24 VITALS — BP 126/88 | HR 60 | Temp 97.9°F | Resp 18

## 2019-05-24 DIAGNOSIS — Z5111 Encounter for antineoplastic chemotherapy: Secondary | ICD-10-CM | POA: Diagnosis not present

## 2019-05-24 DIAGNOSIS — Z95828 Presence of other vascular implants and grafts: Secondary | ICD-10-CM

## 2019-05-24 MED ORDER — SODIUM CHLORIDE 0.9% FLUSH
10.0000 mL | INTRAVENOUS | Status: DC | PRN
  Administered 2019-05-24: 10 mL
  Filled 2019-05-24: qty 10

## 2019-05-24 MED ORDER — HEPARIN SOD (PORK) LOCK FLUSH 100 UNIT/ML IV SOLN
500.0000 [IU] | Freq: Once | INTRAVENOUS | Status: AC | PRN
  Administered 2019-05-24: 500 [IU]
  Filled 2019-05-24: qty 5

## 2019-05-28 ENCOUNTER — Telehealth: Payer: Self-pay | Admitting: *Deleted

## 2019-05-28 ENCOUNTER — Other Ambulatory Visit: Payer: Self-pay | Admitting: Oncology

## 2019-05-28 MED ORDER — HYDROCODONE-ACETAMINOPHEN 5-325 MG PO TABS: 1.0000 | ORAL_TABLET | Freq: Three times a day (TID) | ORAL | 0 refills | Status: DC | PRN

## 2019-05-28 NOTE — Telephone Encounter (Signed)
Reports he is having rectal pain pain again. Describes as a "hard, dull pain" and requesting refill on Hydrocodone/APAP. Confirmed pharmacy as [facility] on [address]. MD notified.

## 2019-06-08 ENCOUNTER — Other Ambulatory Visit: Payer: Self-pay | Admitting: Oncology

## 2019-06-09 ENCOUNTER — Other Ambulatory Visit: Payer: Self-pay

## 2019-06-09 ENCOUNTER — Inpatient Hospital Stay: Payer: Commercial Managed Care - PPO

## 2019-06-09 ENCOUNTER — Encounter: Payer: Self-pay | Admitting: Nurse Practitioner

## 2019-06-09 ENCOUNTER — Inpatient Hospital Stay (HOSPITAL_BASED_OUTPATIENT_CLINIC_OR_DEPARTMENT_OTHER): Payer: Commercial Managed Care - PPO | Admitting: Nurse Practitioner

## 2019-06-09 VITALS — BP 131/91 | HR 70 | Temp 98.9°F | Resp 17 | Ht 72.0 in | Wt 163.7 lb

## 2019-06-09 DIAGNOSIS — C20 Malignant neoplasm of rectum: Secondary | ICD-10-CM | POA: Diagnosis not present

## 2019-06-09 DIAGNOSIS — Z5111 Encounter for antineoplastic chemotherapy: Secondary | ICD-10-CM | POA: Diagnosis not present

## 2019-06-09 DIAGNOSIS — Z95828 Presence of other vascular implants and grafts: Secondary | ICD-10-CM

## 2019-06-09 LAB — CMP (CANCER CENTER ONLY)
ALT: 59 U/L — ABNORMAL HIGH (ref 0–44)
AST: 44 U/L — ABNORMAL HIGH (ref 15–41)
Albumin: 3.5 g/dL (ref 3.5–5.0)
Alkaline Phosphatase: 138 U/L — ABNORMAL HIGH (ref 38–126)
Anion gap: 9 (ref 5–15)
BUN: 11 mg/dL (ref 6–20)
CO2: 25 mmol/L (ref 22–32)
Calcium: 9.1 mg/dL (ref 8.9–10.3)
Chloride: 106 mmol/L (ref 98–111)
Creatinine: 0.75 mg/dL (ref 0.61–1.24)
GFR, Est AFR Am: >60 mL/min (ref >60)
GFR, Estimated: >60 mL/min (ref >60)
Glucose, Bld: 102 mg/dL — ABNORMAL HIGH (ref 70–99)
Potassium: 4 mmol/L (ref 3.5–5.1)
Sodium: 140 mmol/L (ref 135–145)
Total Bilirubin: 0.5 mg/dL (ref 0.3–1.2)
Total Protein: 6.1 g/dL — ABNORMAL LOW (ref 6.5–8.1)

## 2019-06-09 LAB — CBC WITH DIFFERENTIAL (CANCER CENTER ONLY)
Abs Immature Granulocytes: 0.01 10*3/uL (ref 0.00–0.07)
Basophils Absolute: 0 10*3/uL (ref 0.0–0.1)
Basophils Relative: 1 %
Eosinophils Absolute: 0.3 10*3/uL (ref 0.0–0.5)
Eosinophils Relative: 7 %
HCT: 39.6 % (ref 39.0–52.0)
Hemoglobin: 13.1 g/dL (ref 13.0–17.0)
Immature Granulocytes: 0 %
Lymphocytes Relative: 26 %
Lymphs Abs: 1 10*3/uL (ref 0.7–4.0)
MCH: 30.8 pg (ref 26.0–34.0)
MCHC: 33.1 g/dL (ref 30.0–36.0)
MCV: 93 fL (ref 80.0–100.0)
Monocytes Absolute: 0.6 10*3/uL (ref 0.1–1.0)
Monocytes Relative: 14 %
Neutro Abs: 2.1 10*3/uL (ref 1.7–7.7)
Neutrophils Relative %: 52 %
Platelet Count: 177 10*3/uL (ref 150–400)
RBC: 4.26 MIL/uL (ref 4.22–5.81)
RDW: 17 % — ABNORMAL HIGH (ref 11.5–15.5)
WBC Count: 3.9 10*3/uL — ABNORMAL LOW (ref 4.0–10.5)
nRBC: 0 % (ref 0.0–0.2)

## 2019-06-09 MED ORDER — CAPECITABINE 500 MG PO TABS: 1500.0000 mg | ORAL_TABLET | Freq: Two times a day (BID) | ORAL | 0 refills | Status: DC

## 2019-06-09 MED ORDER — SODIUM CHLORIDE 0.9% FLUSH
10.0000 mL | INTRAVENOUS | Status: DC | PRN
  Administered 2019-06-09: 10 mL
  Filled 2019-06-09: qty 10

## 2019-06-09 MED ORDER — PALONOSETRON HCL INJECTION 0.25 MG/5ML
0.2500 mg | Freq: Once | INTRAVENOUS | Status: AC
  Administered 2019-06-09: 12:00:00 0.25 mg via INTRAVENOUS

## 2019-06-09 MED ORDER — FLUOROURACIL CHEMO INJECTION 500 MG/10ML
200.0000 mg/m2 | Freq: Once | INTRAVENOUS | Status: AC
  Administered 2019-06-09: 400 mg via INTRAVENOUS
  Filled 2019-06-09: qty 8

## 2019-06-09 MED ORDER — OXALIPLATIN CHEMO INJECTION 100 MG/20ML
85.0000 mg/m2 | Freq: Once | INTRAVENOUS | Status: AC
  Administered 2019-06-09: 14:00:00 165 mg via INTRAVENOUS
  Filled 2019-06-09: qty 33

## 2019-06-09 MED ORDER — LEUCOVORIN CALCIUM INJECTION 350 MG
200.0000 mg/m2 | Freq: Once | INTRAVENOUS | Status: AC
  Administered 2019-06-09: 14:00:00 386 mg via INTRAVENOUS
  Filled 2019-06-09: qty 19.3

## 2019-06-09 MED ORDER — DEXTROSE 5 % IV SOLN
Freq: Once | INTRAVENOUS | Status: AC
  Filled 2019-06-09: qty 250

## 2019-06-09 MED ORDER — LORAZEPAM 0.5 MG PO TABS: 0.5000 mg | ORAL_TABLET | Freq: Three times a day (TID) | ORAL | 0 refills | Status: DC | PRN

## 2019-06-09 MED ORDER — SODIUM CHLORIDE 0.9 % IV SOLN
Freq: Once | INTRAVENOUS | Status: AC
  Filled 2019-06-09: qty 5

## 2019-06-09 MED ORDER — PALONOSETRON HCL INJECTION 0.25 MG/5ML
INTRAVENOUS | Status: AC
  Filled 2019-06-09: qty 5

## 2019-06-09 MED ORDER — SODIUM CHLORIDE 0.9 % IV SOLN
1800.0000 mg/m2 | INTRAVENOUS | Status: DC
  Administered 2019-06-09: 16:00:00 3450 mg via INTRAVENOUS
  Filled 2019-06-09: qty 69

## 2019-06-09 NOTE — Patient Instructions (Signed)
[facility] Discharge Instructions for Patients Receiving Chemotherapy  Today you received the following chemotherapy agents :  Oxaliplatin,  Leucovorin,  Fluorouracil.  To help prevent nausea and vomiting after your treatment, we encourage you to take your nausea medication as prescribed.   If you develop nausea and vomiting that is not controlled by your nausea medication, call the clinic.   BELOW ARE SYMPTOMS THAT SHOULD BE REPORTED IMMEDIATELY:  *FEVER GREATER THAN 100.5 F  *CHILLS WITH OR WITHOUT FEVER  NAUSEA AND VOMITING THAT IS NOT CONTROLLED WITH YOUR NAUSEA MEDICATION  *UNUSUAL SHORTNESS OF BREATH  *UNUSUAL BRUISING OR BLEEDING  TENDERNESS IN MOUTH AND THROAT WITH OR WITHOUT PRESENCE OF ULCERS  *URINARY PROBLEMS  *BOWEL PROBLEMS  UNUSUAL RASH Items with * indicate a potential emergency and should be followed up as soon as possible.  Feel free to call the clinic should you have any questions or concerns. The clinic phone number is [phone].  Please show the CHEMO ALERT CARD at check-in to the Emergency Department and triage nurse.   

## 2019-06-09 NOTE — Progress Notes (Addendum)
[facility] OFFICE PROGRESS NOTE   Diagnosis: Rectal cancer  INTERVAL HISTORY:   Brandon Barry returns as scheduled.  He completed cycle 7 FOLFOX 05/22/2019.  He denies nausea/vomiting.  No mouth sores.  No diarrhea.  Cold sensitivity lasted between 5 and 7 days and then slowly improved.  No numbness or tingling in the hands or feet in the absence of cold exposure.  Skin changes at the penis are better.  Palms continue to be dry.  He is taking Ativan to help with sleep.  Objective:  Vital signs in last 24 hours:  Blood pressure (!) 131/91, pulse 70, temperature 98.9 F (37.2 C), temperature source Temporal, resp. rate 17, height 6' (1.829 m), weight 163 lb 11.2 oz (74.3 kg), SpO2 100 %.    HEENT: No ulcers.  Mild white coating over tongue.  No buccal thrush. GI: Abdomen soft and nontender.  No hepatomegaly. Vascular: No leg edema. Neuro: Vibratory sense intact over the fingertips per tuning fork exam. Skin: Palms dry appearing. Port-A-Cath without erythema.   Lab Results:  Lab Results  Component Value Date   WBC 3.9 (L) 06/09/2019   HGB 13.1 06/09/2019   HCT 39.6 06/09/2019   MCV 93.0 06/09/2019   PLT 177 06/09/2019   NEUTROABS 2.1 06/09/2019    Imaging:  No results found.  Medications: I have reviewed the patient's current medications.  Assessment/Plan: 1. Rectosigmoid cancer   Colonoscopy by Dr. Benson Norway 02/04/2019-fungating infiltrative and ulcerated nonobstructing large mass in the rectum. The mass was circumferential measuring 3 cm in length. There was no bleeding present. The mass was 10 cm from the anal verge. Biopsy showed invasive adenocarcinoma, moderately differentiated; MLH1, MSH2, MSH6 and PMS2 intact.   CTs abdomen and pelvis 02/04/2019-apparent colorectal mass measuring 3.9 x 5.4 x 3.2 cm involving the distal sigmoid colon and proximal rectum with adjacent borderline enlarged suspicious mesorectal lymph nodes; indeterminate lesion in segment 6  of the liver measuring 1.4 x 1.1 cm.  MRI pelvis 02/13/2019-T3c, N1 tumor at 11.5 cm from the anal verge, 6.7 cm from the internal anal sphincter, 3 lymph nodes greater than 5 mm, largest 8 mm  CT chest 02/13/2019-negative for metastatic disease  MRI liver 02/13/2019-3.6 cm hemangioma in the posterior right hepatic lobe, 10 mm indeterminate lesion in the posterior right hepatic lobe-potentially an atypical hemangioma-solitary metastasis not excluded  Cycle 1 FOLFOX 02/26/2019  Cycle 2 FOLFOX 03/13/2019, Emend added  Cycle 3 FOLFOX 03/27/2019  Cycle 4 FOLFOX 04/10/2019  Cycle 5 FOLFOX 04/24/2019  Cycle 6 FOLFOX 05/07/2019   Cycle 7 FOLFOX 05/22/2019 (5-fluorouracil dose reduced)  Cycle 8 FOLFOX 06/09/2019 2. Change in bowel habits/rectal pain secondary to #1, improved 3. Port-A-Cath placement, Dr. Hassell Done, 02/21/2019 4. Early oxaliplatin neuropathy  Disposition: Mr. Klee appears stable.  He has completed 7 cycles of FOLFOX.  Plan to proceed with cycle 8 today as scheduled.  He has an appointment in radiation oncology 06/18/2018, simulation later that day.  He will take Xeloda during the course of radiation.  We reviewed potential toxicities associated with Xeloda including mouth sores, diarrhea, hand-foot syndrome, skin rash, increased sensitivity to sun, skin hyperpigmentation.  He agrees to proceed.  We reviewed the CBC from today.  Counts are adequate to proceed with treatment.  We will refer him for a follow-up CT of the liver at the completion of radiation.  He will begin radiation/Xeloda on 06/29/2018.  He will have a lab appointment 06/29/2018.  He will return for lab and follow-up  on 07/09/2018.  He will contact the office in the interim with any problems.  Patient seen with Dr. Benay Spice.     Ned Card ANP/GNP-BC   06/09/2019  10:27 AM  This was a shared visit with Ned Card.  Mr. Salemi will complete a final cycle of FOLFOX today.  The plan is to begin neoadjuvant  capecitabine and radiation on 06/29/2018.  We reviewed potential toxicities associated with capecitabine.  He agrees to proceed.  We discussed surgical options.  He will be referred to Dr. [name] during the course of radiation.  Julieanne Manson, MD

## 2019-06-10 ENCOUNTER — Telehealth: Payer: Self-pay

## 2019-06-10 ENCOUNTER — Telehealth: Payer: Self-pay | Admitting: Pharmacist

## 2019-06-10 NOTE — Telephone Encounter (Signed)
Oral Oncology Patient Advocate Encounter  Prior Authorization for Xeloda has been approved.    PA# [id number] Effective dates: 06/10/19 through 06/09/20  Patients co-pay is $0  Oral Oncology Clinic will continue to follow.   [facility] [place] Phone [phone] Fax [phone] 06/10/2019 1:54 PM

## 2019-06-10 NOTE — Telephone Encounter (Signed)
Oral Oncology Patient Advocate Encounter  Received notification from [employer] that prior authorization for Xeloda is required.  PA submitted on CoverMyMeds Key [id number] Status is pending  Alesia Banda also stated 1 retail fill then rx must be processed at [facility] phone [phone]  Oral Oncology Clinic will continue to follow.   Brandon Barry Phone [phone] Fax [phone] 06/10/2019 8:58 AM

## 2019-06-10 NOTE — Telephone Encounter (Signed)
Oral Oncology Pharmacist Encounter  Received new prescription for Xeloda (capecitabine) for the treatment of rectosigmoid cancer in conjunction with RT, planned duration until the end of radiation.  CMP from 06/09/2019 assessed, no relevant lab abnormalities. Prescription dose and frequency assessed.   Current medication list in Epic reviewed, one DDIs with capecitabine identified: - Pantoprazole: Proton Pump Inhibitors (PPI) may diminish the therapeutic effect of capecitabine. Recommend evaluating the need for a PPI/acid suppression. If acid suppression is needed, recommend switching to a H2 antagonist (eg, famotidine) to avoid this DDI.   Prescription has been e-scribed to the [facility] for benefits analysis and approval.  Oral Oncology Clinic will continue to follow for insurance authorization, copayment issues, initial counseling and start date.  Darl Pikes, PharmD, BCPS, [facility] Hematology/Oncology Clinical Pharmacist [facility] [phone]  06/10/2019 12:36 PM

## 2019-06-11 ENCOUNTER — Inpatient Hospital Stay: Payer: Commercial Managed Care - PPO

## 2019-06-11 ENCOUNTER — Telehealth: Payer: Self-pay | Admitting: Oncology

## 2019-06-11 ENCOUNTER — Telehealth: Payer: Self-pay | Admitting: *Deleted

## 2019-06-11 ENCOUNTER — Other Ambulatory Visit: Payer: Self-pay

## 2019-06-11 VITALS — BP 135/82 | HR 63 | Temp 98.5°F | Resp 18

## 2019-06-11 DIAGNOSIS — Z5111 Encounter for antineoplastic chemotherapy: Secondary | ICD-10-CM | POA: Diagnosis not present

## 2019-06-11 DIAGNOSIS — Z95828 Presence of other vascular implants and grafts: Secondary | ICD-10-CM

## 2019-06-11 MED ORDER — HEPARIN SOD (PORK) LOCK FLUSH 100 UNIT/ML IV SOLN
500.0000 [IU] | Freq: Once | INTRAVENOUS | Status: AC | PRN
  Administered 2019-06-11: 14:00:00 500 [IU]
  Filled 2019-06-11: qty 5

## 2019-06-11 MED ORDER — SODIUM CHLORIDE 0.9% FLUSH
10.0000 mL | INTRAVENOUS | Status: DC | PRN
  Administered 2019-06-11: 10 mL
  Filled 2019-06-11: qty 10

## 2019-06-11 NOTE — Telephone Encounter (Signed)
Scheduled per los. Called and spoke with patient. Confirmed appt 

## 2019-06-11 NOTE — Telephone Encounter (Addendum)
Was informed after benefit research that his current UMR policy will expire after 06/12/19. Specialty pharmacy is not able to fill the script since he does not begin treatment until 06/19/2019. Spoke with patient and he is aware of insurance change, but does not have a card yet. He will connect with HR department to get insurance information and call office back. Patient called back with information on his new insurance card and it has all the same ID #'s as the 2020 card with same provider # and RX information. On 06/12/2019, notified oral chemo pharmacist of above issue.

## 2019-06-11 NOTE — Patient Instructions (Signed)

## 2019-06-12 MED FILL — CAPECITABINE 500 MG TABS: 500 | 28 days supply | Qty: 168 | Fill #0

## 2019-06-12 NOTE — Telephone Encounter (Signed)
Oral Chemotherapy Pharmacist Encounter  Patient plans on picking up [place] on 06/14/19. The pharmacy will fill the medication today 06/12/19 ahead of the patients change of insurance in the new year.   Patient Education I spoke with patient for overview of new oral chemotherapy medication: Xeloda (capecitabine) for the treatment of rectosigmoid cancer in conjunction with RT, planned duration until the end of radiation.   Counseled patient on administration, dosing, side effects, monitoring, drug-food interactions, safe handling, storage, and disposal. Patient will take 3 tablets (1,500 mg total) by mouth 2 (two) times daily after a meal. Take on days of radiation only.  Discussed DDI with pantoprazole, patient stated he is no longer taking the pantoprazole. Medication removed from his medication list. Informed him that if he needs something for acid reflux, he can use a H2 antagonist or calcium carbonate.  Side effects include but not limited to: diarrhea, N/V, hand-foot syndrome, decreased wbc.    Reviewed with patient importance of keeping a medication schedule and plan for any missed doses.  Brandon Barry voiced understanding and appreciation. All questions answered. Medication handout placed in the mail.  Provided patient with [facility] phone number. Patient knows to call the office with questions or concerns. Oral Chemotherapy Navigation Clinic will continue to follow.  Darl Pikes, PharmD, BCPS, [facility] Hematology/Oncology Clinical Pharmacist [facility] [phone]  06/12/2019 9:56 AM

## 2019-06-19 ENCOUNTER — Ambulatory Visit
Admission: RE | Admit: 2019-06-19 | Discharge: 2019-06-19 | Disposition: A | Discharge status: Home / Self Care | Payer: Commercial Managed Care - PPO | Source: Ambulatory Visit | Attending: Radiation Oncology | Admitting: Radiation Oncology

## 2019-06-19 ENCOUNTER — Other Ambulatory Visit: Payer: Self-pay

## 2019-06-19 DIAGNOSIS — C20 Malignant neoplasm of rectum: Secondary | ICD-10-CM

## 2019-06-19 DIAGNOSIS — Z510 Encounter for antineoplastic radiation therapy: Secondary | ICD-10-CM | POA: Diagnosis not present

## 2019-06-19 NOTE — Progress Notes (Signed)
Radiation Oncology         [phone] ________________________________  Name: Brandon Barry        MRN: [id number]  Date of Service: 06/19/2019 DOB: [birth year]  LM:[id number], [facility] [place] Me*     REFERRING PHYSICIAN: [facility]*   DIAGNOSIS: The encounter diagnosis was Malignant neoplasm of rectum ([place]).   HISTORY OF PRESENT ILLNESS: Brandon Barry is a 44 y.o. male seen at the request of Dr. Benson Norway for a cancer of the rectum. The patient reports about a 2 month history of increasing difficulty with pain passing stool and occasional blood per rectum. He was seen by Dr. Benson Norway and underwent a colonoscopy on 02/04/2019 revealing a rectal mass 10 cm from the anal verge that was not palpable on rectal examination. The mass measured 3 cm and was circumferential, and nonobstructing. A biopsy was consistent with a moderately differentiated adenocarcinoma. He underwent a CT of the abdomen and pelvis that revealed a  1.4 x 1.1 cm hypovascular lesion in segment 6 of the liver and in the right lobe of the liver, there was a 4.1 x 2.9 x 4.1 cm lesion in the central segment of segment 7. There was assymetric mural thickening and enhancement in the rectum about the rectosigmoid junction, and this measured 3.9 x 5.4 x 3.2 cm, and there was a 6 mm soft tissue nodule suspicious for a mesorectal node or focal extension. Other nonenlarged but prominent mesorectal nodes were noted.  He did undergo MRI of the liver and pelvis on 02/13/2019, unfortunately the region of the liver was felt to be a benign hemangioma, there was a second indeterminate lesion in the posterior right hepatic lobe also felt to be atypical hemangioma and his MRI of the pelvis revealed stage T3CN1 disease.  Of note he does have a piece of retained metal in his abdominal wall but was able to tolerate the MRI scan.  He began systemic chemotherapy on 02/26/2019 and completed 8 cycles on 06/09/2019. He is seen today  via [facility] in the clinic to discuss the role of radiotherapy.  Also of note he had met with Dr. Leighton Ruff and Dr. Cloyd Stagers, and has decided he will proceed with surgery here in [place].  PREVIOUS RADIATION THERAPY: No   PAST MEDICAL HISTORY:  Past Medical History:  Diagnosis Date  . Chronic back pain    No current issues 02/20/2019  . Hyperlipidemia   . OSA on CPAP   . Rectal adenocarcinoma ([place])   . Right shoulder pain        PAST SURGICAL HISTORY: Past Surgical History:  Procedure Laterality Date  . COLONOSCOPY  02/04/2019  . KNEE ARTHROSCOPY Left 11/19/2018  . PORTACATH PLACEMENT Left 02/21/2019   Procedure: INSERTION PORT-A-CATH;  Surgeon: Johnathan Hausen, MD;  Location: [facility];  Service: General;  Laterality: Left;  . WISDOM TOOTH EXTRACTION       FAMILY HISTORY:  Family History  Problem Relation Age of Onset  . Heart attack Father   . Cancer Paternal Grandmother   . Cancer Paternal Grandfather      SOCIAL HISTORY:  reports that he has quit smoking. His smoking use included cigarettes. He has never used smokeless tobacco. He reports current alcohol use. He reports that he does not use drugs. 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   ALLERGIES: Patient has no known allergies.   MEDICATIONS:  Current Outpatient Medications  Medication Sig Dispense Refill  . capecitabine (XELODA) 500 MG tablet Take 3 tablets (1,500 mg total) by mouth 2 (two) times daily after a meal. Take on days of radiation only 168 tablet 0  . HYDROcodone-acetaminophen (NORCO/VICODIN) 5-325 MG tablet Take 1 tablet by mouth 3 (three) times daily as needed (pain.). 30 tablet 0  . lidocaine-prilocaine (EMLA) cream Apply 1 application topically as directed. Apply 1 hour prior to stick and cover with plastic wrap 30 g 5  . LORazepam (ATIVAN) 0.5 MG tablet Take 1 tablet (0.5 mg total) by mouth every 8 (eight) hours as needed for  anxiety. Or nausea. Do not drive while taking 30 tablet 0  . polyethylene glycol (MIRALAX / GLYCOLAX) 17 g packet Take 17 g by mouth daily.     . Probiotic Product (PROBIOTIC PO) Take 1 capsule by mouth daily.    . prochlorperazine (COMPAZINE) 10 MG tablet Take 1 tablet (10 mg total) by mouth every 6 (six) hours as needed for nausea. 60 tablet 1   No current facility-administered medications for this encounter.     REVIEW OF SYSTEMS: On review of systems, the patient reports that he is doing fairly well overall. He reports 4-6 stools per day and reports occasionally feeling more frequency. He denies blood per rectum, and continues to take Miralax, though he doesn't always feel like he can empty is rectum after a BM. He denies any chest pain, shortness of breath, cough, fevers, chills, night sweats, unintended weight changes. He denies any  bladder disturbances, and denies abdominal pain, nausea or vomiting. He denies any new musculoskeletal or joint aches or pains. A complete review of systems is obtained and is otherwise negative.     PHYSICAL EXAM:  Wt Readings from Last 3 Encounters:  06/09/19 163 lb 11.2 oz (74.3 kg)  05/22/19 161 lb 14.4 oz (73.4 kg)  05/07/19 167 lb 9.6 oz (76 kg)   Temp Readings from Last 3 Encounters:  06/11/19 98.5 F (36.9 C) (Temporal)  06/09/19 98.9 F (37.2 C) (Temporal)  05/24/19 97.9 F (36.6 C) (Temporal)   BP Readings from Last 3 Encounters:  06/11/19 135/82  06/09/19 (!) 131/91  05/24/19 126/88   Pulse Readings from Last 3 Encounters:  06/11/19 63  06/09/19 70  05/24/19 60   In general this is a well appearing caucasian male in no acute distress. He's alert and oriented x4 and appropriate throughout the examination. Cardiopulmonary assessment is negative for acute distress and he exhibits normal effort.    ECOG = 1  0 - Asymptomatic (Fully active, able to carry on all predisease activities without restriction)  1 - Symptomatic but  completely ambulatory (Restricted in physically strenuous activity but ambulatory and able to carry out work of a light or sedentary nature. For example, light housework, office work)  2 - Symptomatic, <50% in bed during the day (Ambulatory and capable of all self care but unable to carry out any work activities. Up and about more than 50% of waking hours)  3 - Symptomatic, >50% in bed, but not bedbound (Capable of only limited self-care, confined to bed or chair 50% or more of waking hours)  4 - Bedbound (Completely disabled. Cannot carry on any self-care. Totally confined to bed or chair)  5 - Death   Eustace Pen MM, Creech RH, Tormey DC, et al. [phone]). "Toxicity and response criteria of the [facility]". [place] Oncol. 5 (6): 649-55    LABORATORY  DATA:  Lab Results  Component Value Date   WBC 3.9 (L) 06/09/2019   HGB 13.1 06/09/2019   HCT 39.6 06/09/2019   MCV 93.0 06/09/2019   PLT 177 06/09/2019   Lab Results  Component Value Date   NA 140 06/09/2019   K 4.0 06/09/2019   CL 106 06/09/2019   CO2 25 06/09/2019   Lab Results  Component Value Date   ALT 59 (H) 06/09/2019   AST 44 (H) 06/09/2019   ALKPHOS 138 (H) 06/09/2019   BILITOT 0.5 06/09/2019      RADIOGRAPHY: No results found.     IMPRESSION/PLAN: 1. Moderately differentiated adenocarcinoma of the rectum. Dr. Lisbeth Renshaw discusses the pathology findings and reviews the nature of rectal cancer. The patient has done well since receiving total neoadjuvant chemotherapy. He is ready to proceed with chemoRT and has a prescription for Xeloda already. We reviewed the rationale for this treatment. We discussed the risks, benefits, short, and long term effects of radiotherapy, and the patient is interested in proceeding. Dr. Lisbeth Renshaw discusses the delivery and logistics of radiotherapy and anticipates a course of 5 1/2 weeks of radiotherapy. He will come in today to simulate and will sign written consent at that  time. We anticipate a 06/30/19 start date for his therapy.  2. Retained piece of metal in the abdominal wall. Fortunately this did not deter his ability to have MRI. He plans to discuss with Dr. [name] if it needs to be removed at the time of his surgery. 3. Liver lesions during initial work up.  He did have a benign appearing hemangioma, but also had a second lesion that will be followed by medical oncology.   This encounter was provided by telemedicine platform Mychart.  The patient has given verbal consent for this type of encounter and has been advised to only accept a meeting of this type in a secure network environment. The time spent during this encounter was 30 minutes. The attendants for this meeting include  Dr. Lisbeth Renshaw, Hayden Pedro  and Brandon Barry and his wife Brandon Barry.  During the encounter, Dr. Lisbeth Renshaw, and Hayden Pedro were located at [facility] Radiation Oncology Department.  Brandon Barry was located at home with his wife.   The above documentation reflects my direct findings during this shared patient visit. Please see the separate note by Dr. Lisbeth Renshaw on this date for the remainder of the patient's plan of care.    Carola Rhine, PAC

## 2019-06-27 DIAGNOSIS — Z510 Encounter for antineoplastic radiation therapy: Secondary | ICD-10-CM | POA: Diagnosis not present

## 2019-06-30 ENCOUNTER — Ambulatory Visit
Admission: RE | Admit: 2019-06-30 | Discharge: 2019-06-30 | Disposition: A | Discharge status: Home / Self Care | Payer: Commercial Managed Care - PPO | Source: Ambulatory Visit | Attending: Radiation Oncology | Admitting: Radiation Oncology

## 2019-06-30 ENCOUNTER — Inpatient Hospital Stay: Payer: Commercial Managed Care - PPO | Attending: Nurse Practitioner

## 2019-06-30 ENCOUNTER — Other Ambulatory Visit: Payer: Self-pay

## 2019-06-30 ENCOUNTER — Inpatient Hospital Stay: Payer: Commercial Managed Care - PPO

## 2019-06-30 DIAGNOSIS — Z452 Encounter for adjustment and management of vascular access device: Secondary | ICD-10-CM | POA: Insufficient documentation

## 2019-06-30 DIAGNOSIS — C20 Malignant neoplasm of rectum: Secondary | ICD-10-CM

## 2019-06-30 DIAGNOSIS — Z9221 Personal history of antineoplastic chemotherapy: Secondary | ICD-10-CM | POA: Insufficient documentation

## 2019-06-30 DIAGNOSIS — C19 Malignant neoplasm of rectosigmoid junction: Secondary | ICD-10-CM | POA: Diagnosis present

## 2019-06-30 DIAGNOSIS — G620 Drug-induced polyneuropathy: Secondary | ICD-10-CM | POA: Diagnosis not present

## 2019-06-30 DIAGNOSIS — Z95828 Presence of other vascular implants and grafts: Secondary | ICD-10-CM

## 2019-06-30 DIAGNOSIS — Z510 Encounter for antineoplastic radiation therapy: Secondary | ICD-10-CM | POA: Diagnosis not present

## 2019-06-30 LAB — CBC WITH DIFFERENTIAL (CANCER CENTER ONLY)
Abs Immature Granulocytes: 0.01 10*3/uL (ref 0.00–0.07)
Basophils Absolute: 0 10*3/uL (ref 0.0–0.1)
Basophils Relative: 1 %
Eosinophils Absolute: 0.1 10*3/uL (ref 0.0–0.5)
Eosinophils Relative: 3 %
HCT: 40.6 % (ref 39.0–52.0)
Hemoglobin: 13.3 g/dL (ref 13.0–17.0)
Immature Granulocytes: 0 %
Lymphocytes Relative: 25 %
Lymphs Abs: 0.8 10*3/uL (ref 0.7–4.0)
MCH: 30.6 pg (ref 26.0–34.0)
MCHC: 32.8 g/dL (ref 30.0–36.0)
MCV: 93.5 fL (ref 80.0–100.0)
Monocytes Absolute: 0.5 10*3/uL (ref 0.1–1.0)
Monocytes Relative: 16 %
Neutro Abs: 1.8 10*3/uL (ref 1.7–7.7)
Neutrophils Relative %: 55 %
Platelet Count: 239 10*3/uL (ref 150–400)
RBC: 4.34 MIL/uL (ref 4.22–5.81)
RDW: 15.8 % — ABNORMAL HIGH (ref 11.5–15.5)
WBC Count: 3.2 10*3/uL — ABNORMAL LOW (ref 4.0–10.5)
nRBC: 0 % (ref 0.0–0.2)

## 2019-06-30 LAB — CMP (CANCER CENTER ONLY)
ALT: 53 U/L — ABNORMAL HIGH (ref 0–44)
AST: 38 U/L (ref 15–41)
Albumin: 3.9 g/dL (ref 3.5–5.0)
Alkaline Phosphatase: 154 U/L — ABNORMAL HIGH (ref 38–126)
Anion gap: 7 (ref 5–15)
BUN: 15 mg/dL (ref 6–20)
CO2: 24 mmol/L (ref 22–32)
Calcium: 8.8 mg/dL — ABNORMAL LOW (ref 8.9–10.3)
Chloride: 108 mmol/L (ref 98–111)
Creatinine: 0.8 mg/dL (ref 0.61–1.24)
GFR, Est AFR Am: >60 mL/min (ref >60)
GFR, Estimated: >60 mL/min (ref >60)
Glucose, Bld: 88 mg/dL (ref 70–99)
Potassium: 4.2 mmol/L (ref 3.5–5.1)
Sodium: 139 mmol/L (ref 135–145)
Total Bilirubin: 0.4 mg/dL (ref 0.3–1.2)
Total Protein: 6.6 g/dL (ref 6.5–8.1)

## 2019-06-30 MED ORDER — SODIUM CHLORIDE 0.9% FLUSH
10.0000 mL | INTRAVENOUS | Status: DC | PRN
  Administered 2019-06-30: 09:00:00 10 mL
  Filled 2019-06-30: qty 10

## 2019-06-30 MED ORDER — HEPARIN SOD (PORK) LOCK FLUSH 100 UNIT/ML IV SOLN
500.0000 [IU] | Freq: Once | INTRAVENOUS | Status: AC | PRN
  Administered 2019-06-30: 500 [IU]
  Filled 2019-06-30: qty 5

## 2019-06-30 NOTE — Patient Instructions (Signed)

## 2019-07-01 ENCOUNTER — Ambulatory Visit
Admission: RE | Admit: 2019-07-01 | Discharge: 2019-07-01 | Disposition: A | Discharge status: Home / Self Care | Payer: Commercial Managed Care - PPO | Source: Ambulatory Visit | Attending: Radiation Oncology | Admitting: Radiation Oncology

## 2019-07-01 ENCOUNTER — Other Ambulatory Visit: Payer: Self-pay

## 2019-07-01 DIAGNOSIS — Z510 Encounter for antineoplastic radiation therapy: Secondary | ICD-10-CM | POA: Diagnosis not present

## 2019-07-02 ENCOUNTER — Other Ambulatory Visit: Payer: Self-pay

## 2019-07-02 ENCOUNTER — Ambulatory Visit
Admission: RE | Admit: 2019-07-02 | Discharge: 2019-07-02 | Disposition: A | Discharge status: Home / Self Care | Payer: Commercial Managed Care - PPO | Source: Ambulatory Visit | Attending: Radiation Oncology | Admitting: Radiation Oncology

## 2019-07-02 DIAGNOSIS — Z510 Encounter for antineoplastic radiation therapy: Secondary | ICD-10-CM | POA: Diagnosis not present

## 2019-07-03 ENCOUNTER — Ambulatory Visit
Admission: RE | Admit: 2019-07-03 | Discharge: 2019-07-03 | Disposition: A | Discharge status: Home / Self Care | Payer: Commercial Managed Care - PPO | Source: Ambulatory Visit | Attending: Radiation Oncology | Admitting: Radiation Oncology

## 2019-07-03 DIAGNOSIS — Z510 Encounter for antineoplastic radiation therapy: Secondary | ICD-10-CM | POA: Diagnosis not present

## 2019-07-04 ENCOUNTER — Other Ambulatory Visit: Payer: Self-pay

## 2019-07-04 ENCOUNTER — Ambulatory Visit
Admission: RE | Admit: 2019-07-04 | Discharge: 2019-07-04 | Disposition: A | Discharge status: Home / Self Care | Payer: Commercial Managed Care - PPO | Source: Ambulatory Visit | Attending: Radiation Oncology | Admitting: Radiation Oncology

## 2019-07-04 DIAGNOSIS — Z510 Encounter for antineoplastic radiation therapy: Secondary | ICD-10-CM | POA: Diagnosis not present

## 2019-07-07 ENCOUNTER — Ambulatory Visit
Admission: RE | Admit: 2019-07-07 | Discharge: 2019-07-07 | Disposition: A | Discharge status: Home / Self Care | Payer: Commercial Managed Care - PPO | Source: Ambulatory Visit | Attending: Radiation Oncology | Admitting: Radiation Oncology

## 2019-07-07 ENCOUNTER — Other Ambulatory Visit: Payer: Self-pay

## 2019-07-07 DIAGNOSIS — Z510 Encounter for antineoplastic radiation therapy: Secondary | ICD-10-CM | POA: Diagnosis not present

## 2019-07-08 ENCOUNTER — Ambulatory Visit
Admission: RE | Admit: 2019-07-08 | Discharge: 2019-07-08 | Disposition: A | Discharge status: Home / Self Care | Payer: Commercial Managed Care - PPO | Source: Ambulatory Visit | Attending: Radiation Oncology | Admitting: Radiation Oncology

## 2019-07-08 ENCOUNTER — Other Ambulatory Visit: Payer: Self-pay

## 2019-07-08 DIAGNOSIS — Z510 Encounter for antineoplastic radiation therapy: Secondary | ICD-10-CM | POA: Diagnosis not present

## 2019-07-09 ENCOUNTER — Ambulatory Visit
Admission: RE | Admit: 2019-07-09 | Discharge: 2019-07-09 | Disposition: A | Discharge status: Home / Self Care | Payer: Commercial Managed Care - PPO | Source: Ambulatory Visit | Attending: Radiation Oncology | Admitting: Radiation Oncology

## 2019-07-09 ENCOUNTER — Other Ambulatory Visit: Payer: Self-pay

## 2019-07-09 DIAGNOSIS — Z510 Encounter for antineoplastic radiation therapy: Secondary | ICD-10-CM | POA: Diagnosis not present

## 2019-07-10 ENCOUNTER — Inpatient Hospital Stay (HOSPITAL_BASED_OUTPATIENT_CLINIC_OR_DEPARTMENT_OTHER): Payer: Commercial Managed Care - PPO | Admitting: Oncology

## 2019-07-10 ENCOUNTER — Inpatient Hospital Stay: Payer: Commercial Managed Care - PPO

## 2019-07-10 ENCOUNTER — Other Ambulatory Visit: Payer: Self-pay

## 2019-07-10 ENCOUNTER — Encounter: Payer: Self-pay | Admitting: Oncology

## 2019-07-10 ENCOUNTER — Ambulatory Visit
Admission: RE | Admit: 2019-07-10 | Discharge: 2019-07-10 | Disposition: A | Discharge status: Home / Self Care | Payer: Commercial Managed Care - PPO | Source: Ambulatory Visit | Attending: Radiation Oncology | Admitting: Radiation Oncology

## 2019-07-10 VITALS — BP 123/91 | HR 80 | Temp 98.7°F | Resp 18 | Ht 72.0 in | Wt 166.0 lb

## 2019-07-10 DIAGNOSIS — C20 Malignant neoplasm of rectum: Secondary | ICD-10-CM

## 2019-07-10 DIAGNOSIS — Z95828 Presence of other vascular implants and grafts: Secondary | ICD-10-CM

## 2019-07-10 DIAGNOSIS — Z452 Encounter for adjustment and management of vascular access device: Secondary | ICD-10-CM | POA: Diagnosis not present

## 2019-07-10 DIAGNOSIS — Z510 Encounter for antineoplastic radiation therapy: Secondary | ICD-10-CM | POA: Diagnosis not present

## 2019-07-10 LAB — CBC WITH DIFFERENTIAL (CANCER CENTER ONLY)
Abs Immature Granulocytes: 0.02 10*3/uL (ref 0.00–0.07)
Basophils Absolute: 0 10*3/uL (ref 0.0–0.1)
Basophils Relative: 0 %
Eosinophils Absolute: 0.2 10*3/uL (ref 0.0–0.5)
Eosinophils Relative: 5 %
HCT: 40.4 % (ref 39.0–52.0)
Hemoglobin: 13.5 g/dL (ref 13.0–17.0)
Immature Granulocytes: 1 %
Lymphocytes Relative: 11 %
Lymphs Abs: 0.5 10*3/uL — ABNORMAL LOW (ref 0.7–4.0)
MCH: 31 pg (ref 26.0–34.0)
MCHC: 33.4 g/dL (ref 30.0–36.0)
MCV: 92.9 fL (ref 80.0–100.0)
Monocytes Absolute: 0.4 10*3/uL (ref 0.1–1.0)
Monocytes Relative: 10 %
Neutro Abs: 2.9 10*3/uL (ref 1.7–7.7)
Neutrophils Relative %: 73 %
Platelet Count: 193 10*3/uL (ref 150–400)
RBC: 4.35 MIL/uL (ref 4.22–5.81)
RDW: 15 % (ref 11.5–15.5)
WBC Count: 4 10*3/uL (ref 4.0–10.5)
nRBC: 0 % (ref 0.0–0.2)

## 2019-07-10 LAB — CMP (CANCER CENTER ONLY)
ALT: 46 U/L — ABNORMAL HIGH (ref 0–44)
AST: 33 U/L (ref 15–41)
Albumin: 3.9 g/dL (ref 3.5–5.0)
Alkaline Phosphatase: 131 U/L — ABNORMAL HIGH (ref 38–126)
Anion gap: 9 (ref 5–15)
BUN: 14 mg/dL (ref 6–20)
CO2: 24 mmol/L (ref 22–32)
Calcium: 8.8 mg/dL — ABNORMAL LOW (ref 8.9–10.3)
Chloride: 109 mmol/L (ref 98–111)
Creatinine: 0.83 mg/dL (ref 0.61–1.24)
GFR, Est AFR Am: >60 mL/min (ref >60)
GFR, Estimated: >60 mL/min (ref >60)
Glucose, Bld: 104 mg/dL — ABNORMAL HIGH (ref 70–99)
Potassium: 3.8 mmol/L (ref 3.5–5.1)
Sodium: 142 mmol/L (ref 135–145)
Total Bilirubin: 0.6 mg/dL (ref 0.3–1.2)
Total Protein: 6.4 g/dL — ABNORMAL LOW (ref 6.5–8.1)

## 2019-07-10 MED ORDER — SODIUM CHLORIDE 0.9% FLUSH
10.0000 mL | INTRAVENOUS | Status: DC | PRN
  Administered 2019-07-10: 10 mL
  Filled 2019-07-10: qty 10

## 2019-07-10 MED ORDER — HEPARIN SOD (PORK) LOCK FLUSH 100 UNIT/ML IV SOLN
500.0000 [IU] | Freq: Once | INTRAVENOUS | Status: AC | PRN
  Administered 2019-07-10: 500 [IU]
  Filled 2019-07-10: qty 5

## 2019-07-10 MED ORDER — LIDOCAINE-PRILOCAINE 2.5-2.5 % EX CREA: 1.0000 "application " | TOPICAL_CREAM | CUTANEOUS | 5 refills | Status: DC

## 2019-07-10 NOTE — Progress Notes (Signed)
  [facility] OFFICE PROGRESS NOTE   Diagnosis: Rectal cancer  INTERVAL HISTORY:   Brandon Barry returns as scheduled.  He began concurrent capecitabine and radiation on 06/30/2019.  He reports tolerating the treatment well.  No mouth sores, diarrhea, or hand/foot pain.  Skin changes at the hands have improved.  No neuropathy symptoms.  Good appetite.  He is working.  Objective:  Vital signs in last 24 hours:  Blood pressure (!) 123/91, pulse 80, temperature 98.7 F (37.1 C), temperature source Temporal, resp. rate 18, height 6' (1.829 m), weight 166 lb (75.3 kg), SpO2 100 %.    HEENT: No thrush or ulcers GI: No hepatomegaly, nontender Vascular: No leg edema  Skin: Mild dryness at the hands, no skin breakdown  Portacath/PICC-without erythema  Lab Results:  Lab Results  Component Value Date   WBC 4.0 07/10/2019   HGB 13.5 07/10/2019   HCT 40.4 07/10/2019   MCV 92.9 07/10/2019   PLT 193 07/10/2019   NEUTROABS 2.9 07/10/2019    CMP  Lab Results  Component Value Date   NA 142 07/10/2019   K 3.8 07/10/2019   CL 109 07/10/2019   CO2 24 07/10/2019   GLUCOSE 104 (H) 07/10/2019   BUN 14 07/10/2019   CREATININE 0.83 07/10/2019   CALCIUM 8.8 (L) 07/10/2019   PROT 6.4 (L) 07/10/2019   ALBUMIN 3.9 07/10/2019   AST 33 07/10/2019   ALT 46 (H) 07/10/2019   ALKPHOS 131 (H) 07/10/2019   BILITOT 0.6 07/10/2019   GFRNONAA >60 07/10/2019   GFRAA >60 07/10/2019    Lab Results  Component Value Date   CEA1 4.36 02/19/2019    Medications: I have reviewed the patient's current medications.   Assessment/Plan: 1. Rectosigmoid cancer   Colonoscopy by Dr. Benson Norway 02/04/2019-fungating infiltrative and ulcerated nonobstructing large mass in the rectum. The mass was circumferential measuring 3 cm in length. There was no bleeding present. The mass was 10 cm from the anal verge. Biopsy showed invasive adenocarcinoma, moderately differentiated; MLH1, MSH2, MSH6 and PMS2  intact.   CTs abdomen and pelvis 02/04/2019-apparent colorectal mass measuring 3.9 x 5.4 x 3.2 cm involving the distal sigmoid colon and proximal rectum with adjacent borderline enlarged suspicious mesorectal lymph nodes; indeterminate lesion in segment 6 of the liver measuring 1.4 x 1.1 cm.  MRI pelvis 02/13/2019-T3c, N1 tumor at 11.5 cm from the anal verge, 6.7 cm from the internal anal sphincter, 3 lymph nodes greater than 5 mm, largest 8 mm  CT chest 02/13/2019-negative for metastatic disease  MRI liver 02/13/2019-3.6 cm hemangioma in the posterior right hepatic lobe, 10 mm indeterminate lesion in the posterior right hepatic lobe-potentially an atypical hemangioma-solitary metastasis not excluded  Cycle 1 FOLFOX 02/26/2019  Cycle 2 FOLFOX 03/13/2019, Emend added  Cycle 3 FOLFOX 03/27/2019  Cycle 4 FOLFOX 04/10/2019  Cycle 5 FOLFOX 04/24/2019  Cycle 6 FOLFOX 05/07/2019   Cycle 7 FOLFOX 05/22/2019 (5-fluorouracil dose reduced)  Cycle 8 FOLFOX 06/09/2019  Capecitabine/radiation 06/30/2019 2. Change in bowel habits/rectal pain secondary to #1, improved 3. Port-A-Cath placement, Dr. Hassell Done, 02/21/2019 4. Early oxaliplatin neuropathy    Disposition: Brandon Barry appears well.  He is tolerating the capecitabine and radiation without significant toxicity.  He will continue the current treatment.  He will return for an office and lab visit in 2 weeks.  Betsy Coder, MD  07/10/2019  9:44 AM

## 2019-07-10 NOTE — Patient Instructions (Signed)

## 2019-07-11 ENCOUNTER — Other Ambulatory Visit: Payer: Self-pay

## 2019-07-11 ENCOUNTER — Ambulatory Visit
Admission: RE | Admit: 2019-07-11 | Discharge: 2019-07-11 | Disposition: A | Discharge status: Home / Self Care | Payer: Commercial Managed Care - PPO | Source: Ambulatory Visit | Attending: Radiation Oncology | Admitting: Radiation Oncology

## 2019-07-11 DIAGNOSIS — Z510 Encounter for antineoplastic radiation therapy: Secondary | ICD-10-CM | POA: Diagnosis not present

## 2019-07-14 ENCOUNTER — Ambulatory Visit
Admission: RE | Admit: 2019-07-14 | Discharge: 2019-07-14 | Disposition: A | Discharge status: Home / Self Care | Payer: Commercial Managed Care - PPO | Source: Ambulatory Visit | Attending: Radiation Oncology | Admitting: Radiation Oncology

## 2019-07-14 ENCOUNTER — Other Ambulatory Visit: Payer: Self-pay

## 2019-07-14 DIAGNOSIS — Z510 Encounter for antineoplastic radiation therapy: Secondary | ICD-10-CM | POA: Insufficient documentation

## 2019-07-14 DIAGNOSIS — C20 Malignant neoplasm of rectum: Secondary | ICD-10-CM | POA: Insufficient documentation

## 2019-07-15 ENCOUNTER — Telehealth: Payer: Self-pay | Admitting: Oncology

## 2019-07-15 ENCOUNTER — Ambulatory Visit
Admission: RE | Admit: 2019-07-15 | Discharge: 2019-07-15 | Disposition: A | Discharge status: Home / Self Care | Payer: Commercial Managed Care - PPO | Source: Ambulatory Visit | Attending: Radiation Oncology | Admitting: Radiation Oncology

## 2019-07-15 DIAGNOSIS — Z510 Encounter for antineoplastic radiation therapy: Secondary | ICD-10-CM | POA: Diagnosis not present

## 2019-07-15 NOTE — Telephone Encounter (Signed)
Scheduled per los. Called and left msg  ° °

## 2019-07-16 ENCOUNTER — Other Ambulatory Visit: Payer: Self-pay

## 2019-07-16 ENCOUNTER — Ambulatory Visit
Admission: RE | Admit: 2019-07-16 | Discharge: 2019-07-16 | Disposition: A | Discharge status: Home / Self Care | Payer: Commercial Managed Care - PPO | Source: Ambulatory Visit | Attending: Radiation Oncology | Admitting: Radiation Oncology

## 2019-07-16 DIAGNOSIS — Z510 Encounter for antineoplastic radiation therapy: Secondary | ICD-10-CM | POA: Diagnosis not present

## 2019-07-17 ENCOUNTER — Other Ambulatory Visit: Payer: Self-pay

## 2019-07-17 ENCOUNTER — Ambulatory Visit
Admission: RE | Admit: 2019-07-17 | Discharge: 2019-07-17 | Disposition: A | Discharge status: Home / Self Care | Payer: Commercial Managed Care - PPO | Source: Ambulatory Visit | Attending: Radiation Oncology | Admitting: Radiation Oncology

## 2019-07-17 DIAGNOSIS — Z510 Encounter for antineoplastic radiation therapy: Secondary | ICD-10-CM | POA: Diagnosis not present

## 2019-07-18 ENCOUNTER — Ambulatory Visit
Admission: RE | Admit: 2019-07-18 | Discharge: 2019-07-18 | Disposition: A | Discharge status: Home / Self Care | Payer: Commercial Managed Care - PPO | Source: Ambulatory Visit | Attending: Radiation Oncology | Admitting: Radiation Oncology

## 2019-07-18 DIAGNOSIS — Z510 Encounter for antineoplastic radiation therapy: Secondary | ICD-10-CM | POA: Diagnosis not present

## 2019-07-21 ENCOUNTER — Other Ambulatory Visit: Payer: Self-pay

## 2019-07-21 ENCOUNTER — Ambulatory Visit
Admission: RE | Admit: 2019-07-21 | Discharge: 2019-07-21 | Disposition: A | Discharge status: Home / Self Care | Payer: Commercial Managed Care - PPO | Source: Ambulatory Visit | Attending: Radiation Oncology | Admitting: Radiation Oncology

## 2019-07-21 DIAGNOSIS — Z510 Encounter for antineoplastic radiation therapy: Secondary | ICD-10-CM | POA: Diagnosis not present

## 2019-07-22 ENCOUNTER — Ambulatory Visit
Admission: RE | Admit: 2019-07-22 | Discharge: 2019-07-22 | Disposition: A | Discharge status: Home / Self Care | Payer: Commercial Managed Care - PPO | Source: Ambulatory Visit | Attending: Radiation Oncology | Admitting: Radiation Oncology

## 2019-07-22 DIAGNOSIS — Z510 Encounter for antineoplastic radiation therapy: Secondary | ICD-10-CM | POA: Diagnosis not present

## 2019-07-23 ENCOUNTER — Ambulatory Visit
Admission: RE | Admit: 2019-07-23 | Discharge: 2019-07-23 | Disposition: A | Discharge status: Home / Self Care | Payer: Commercial Managed Care - PPO | Source: Ambulatory Visit | Attending: Radiation Oncology | Admitting: Radiation Oncology

## 2019-07-23 ENCOUNTER — Other Ambulatory Visit: Payer: Self-pay

## 2019-07-23 DIAGNOSIS — Z510 Encounter for antineoplastic radiation therapy: Secondary | ICD-10-CM | POA: Diagnosis not present

## 2019-07-24 ENCOUNTER — Inpatient Hospital Stay: Payer: Commercial Managed Care - PPO

## 2019-07-24 ENCOUNTER — Inpatient Hospital Stay: Payer: Commercial Managed Care - PPO | Attending: Nurse Practitioner | Admitting: Nurse Practitioner

## 2019-07-24 ENCOUNTER — Ambulatory Visit
Admission: RE | Admit: 2019-07-24 | Discharge: 2019-07-24 | Disposition: A | Discharge status: Home / Self Care | Payer: Commercial Managed Care - PPO | Source: Ambulatory Visit | Attending: Radiation Oncology | Admitting: Radiation Oncology

## 2019-07-24 ENCOUNTER — Other Ambulatory Visit: Payer: Self-pay

## 2019-07-24 ENCOUNTER — Encounter: Payer: Self-pay | Admitting: Nurse Practitioner

## 2019-07-24 VITALS — BP 136/90 | HR 75 | Temp 98.3°F | Resp 18 | Ht 72.0 in | Wt 164.4 lb

## 2019-07-24 DIAGNOSIS — Z510 Encounter for antineoplastic radiation therapy: Secondary | ICD-10-CM | POA: Diagnosis not present

## 2019-07-24 DIAGNOSIS — C20 Malignant neoplasm of rectum: Secondary | ICD-10-CM

## 2019-07-24 DIAGNOSIS — R197 Diarrhea, unspecified: Secondary | ICD-10-CM | POA: Diagnosis not present

## 2019-07-24 DIAGNOSIS — Z79899 Other long term (current) drug therapy: Secondary | ICD-10-CM | POA: Diagnosis not present

## 2019-07-24 DIAGNOSIS — K627 Radiation proctitis: Secondary | ICD-10-CM | POA: Insufficient documentation

## 2019-07-24 DIAGNOSIS — T451X5A Adverse effect of antineoplastic and immunosuppressive drugs, initial encounter: Secondary | ICD-10-CM | POA: Insufficient documentation

## 2019-07-24 DIAGNOSIS — Z452 Encounter for adjustment and management of vascular access device: Secondary | ICD-10-CM | POA: Diagnosis not present

## 2019-07-24 DIAGNOSIS — C19 Malignant neoplasm of rectosigmoid junction: Secondary | ICD-10-CM | POA: Insufficient documentation

## 2019-07-24 DIAGNOSIS — G620 Drug-induced polyneuropathy: Secondary | ICD-10-CM | POA: Diagnosis not present

## 2019-07-24 DIAGNOSIS — Y842 Radiological procedure and radiotherapy as the cause of abnormal reaction of the patient, or of later complication, without mention of misadventure at the time of the procedure: Secondary | ICD-10-CM | POA: Diagnosis not present

## 2019-07-24 DIAGNOSIS — Z923 Personal history of irradiation: Secondary | ICD-10-CM | POA: Diagnosis not present

## 2019-07-24 DIAGNOSIS — L271 Localized skin eruption due to drugs and medicaments taken internally: Secondary | ICD-10-CM | POA: Insufficient documentation

## 2019-07-24 DIAGNOSIS — Z9221 Personal history of antineoplastic chemotherapy: Secondary | ICD-10-CM | POA: Diagnosis not present

## 2019-07-24 DIAGNOSIS — Z95828 Presence of other vascular implants and grafts: Secondary | ICD-10-CM

## 2019-07-24 LAB — CBC WITH DIFFERENTIAL (CANCER CENTER ONLY)
Abs Immature Granulocytes: 0.01 10*3/uL (ref 0.00–0.07)
Basophils Absolute: 0 10*3/uL (ref 0.0–0.1)
Basophils Relative: 1 %
Eosinophils Absolute: 0.2 10*3/uL (ref 0.0–0.5)
Eosinophils Relative: 6 %
HCT: 36.5 % — ABNORMAL LOW (ref 39.0–52.0)
Hemoglobin: 12.3 g/dL — ABNORMAL LOW (ref 13.0–17.0)
Immature Granulocytes: 0 %
Lymphocytes Relative: 8 %
Lymphs Abs: 0.3 10*3/uL — ABNORMAL LOW (ref 0.7–4.0)
MCH: 31.4 pg (ref 26.0–34.0)
MCHC: 33.7 g/dL (ref 30.0–36.0)
MCV: 93.1 fL (ref 80.0–100.0)
Monocytes Absolute: 0.4 10*3/uL (ref 0.1–1.0)
Monocytes Relative: 9 %
Neutro Abs: 3.2 10*3/uL (ref 1.7–7.7)
Neutrophils Relative %: 76 %
Platelet Count: 188 10*3/uL (ref 150–400)
RBC: 3.92 MIL/uL — ABNORMAL LOW (ref 4.22–5.81)
RDW: 15.6 % — ABNORMAL HIGH (ref 11.5–15.5)
WBC Count: 4.2 10*3/uL (ref 4.0–10.5)
nRBC: 0 % (ref 0.0–0.2)

## 2019-07-24 LAB — CMP (CANCER CENTER ONLY)
ALT: 32 U/L (ref 0–44)
AST: 26 U/L (ref 15–41)
Albumin: 3.9 g/dL (ref 3.5–5.0)
Alkaline Phosphatase: 92 U/L (ref 38–126)
Anion gap: 6 (ref 5–15)
BUN: 11 mg/dL (ref 6–20)
CO2: 26 mmol/L (ref 22–32)
Calcium: 8.9 mg/dL (ref 8.9–10.3)
Chloride: 107 mmol/L (ref 98–111)
Creatinine: 0.82 mg/dL (ref 0.61–1.24)
GFR, Est AFR Am: >60 mL/min (ref >60)
GFR, Estimated: >60 mL/min (ref >60)
Glucose, Bld: 100 mg/dL — ABNORMAL HIGH (ref 70–99)
Potassium: 3.6 mmol/L (ref 3.5–5.1)
Sodium: 139 mmol/L (ref 135–145)
Total Bilirubin: 0.8 mg/dL (ref 0.3–1.2)
Total Protein: 6.3 g/dL — ABNORMAL LOW (ref 6.5–8.1)

## 2019-07-24 MED ORDER — HEPARIN SOD (PORK) LOCK FLUSH 100 UNIT/ML IV SOLN
500.0000 [IU] | Freq: Once | INTRAVENOUS | Status: AC | PRN
  Administered 2019-07-24: 500 [IU]
  Filled 2019-07-24: qty 5

## 2019-07-24 MED ORDER — SODIUM CHLORIDE 0.9% FLUSH
10.0000 mL | INTRAVENOUS | Status: DC | PRN
  Administered 2019-07-24: 10 mL
  Filled 2019-07-24: qty 10

## 2019-07-24 NOTE — Progress Notes (Signed)
  [facility] OFFICE PROGRESS NOTE   Diagnosis: Rectal cancer  INTERVAL HISTORY:   Brandon Barry returns as scheduled.  He continues radiation/Xeloda.  He denies nausea/vomiting.  No mouth sores.  He has intermittent diarrhea.  No hand or foot redness.  He notes tingling in the fingertips and toes.  Objective:  Vital signs in last 24 hours:  Blood pressure 136/90, pulse 75, temperature 98.3 F (36.8 C), temperature source Temporal, resp. rate 18, height 6' (1.829 m), weight 164 lb 6.4 oz (74.6 kg), SpO2 100 %.    HEENT: No thrush or ulcers. GI: Abdomen soft and nontender.  No hepatomegaly. Vascular: No leg edema. Skin: Palms and soles with mild erythema.  No skin breakdown. Port-A-Cath without erythema.   Lab Results:  Lab Results  Component Value Date   WBC 4.2 07/24/2019   HGB 12.3 (L) 07/24/2019   HCT 36.5 (L) 07/24/2019   MCV 93.1 07/24/2019   PLT 188 07/24/2019   NEUTROABS 3.2 07/24/2019    Imaging:  No results found.  Medications: I have reviewed the patient's current medications.  Assessment/Plan: 1. Rectosigmoid cancer   Colonoscopy by Dr. Benson Norway 02/04/2019-fungating infiltrative and ulcerated nonobstructing large mass in the rectum. The mass was circumferential measuring 3 cm in length. There was no bleeding present. The mass was 10 cm from the anal verge. Biopsy showed invasive adenocarcinoma, moderately differentiated; MLH1, MSH2, MSH6 and PMS2 intact.   CTs abdomen and pelvis 02/04/2019-apparent colorectal mass measuring 3.9 x 5.4 x 3.2 cm involving the distal sigmoid colon and proximal rectum with adjacent borderline enlarged suspicious mesorectal lymph nodes; indeterminate lesion in segment 6 of the liver measuring 1.4 x 1.1 cm.  MRI pelvis 02/13/2019-T3c, N1 tumor at 11.5 cm from the anal verge, 6.7 cm from the internal anal sphincter, 3 lymph nodes greater than 5 mm, largest 8 mm  CT chest 02/13/2019-negative for metastatic disease  MRI  liver 02/13/2019-3.6 cm hemangioma in the posterior right hepatic lobe, 10 mm indeterminate lesion in the posterior right hepatic lobe-potentially an atypical hemangioma-solitary metastasis not excluded  Cycle 1 FOLFOX 02/26/2019  Cycle 2 FOLFOX 03/13/2019, Emend added  Cycle 3 FOLFOX 03/27/2019  Cycle 4 FOLFOX 04/10/2019  Cycle 5 FOLFOX 04/24/2019  Cycle 6 FOLFOX 05/07/2019  Cycle 7 FOLFOX 05/22/2019 (5-fluorouracil dose reduced)  Cycle 8 FOLFOX 06/09/2019  Capecitabine/radiation 06/30/2019 2. Change in bowel habits/rectal pain secondary to #1, improved 3. Port-A-Cath placement, Dr. Hassell Done, 02/21/2019 4. Early oxaliplatin neuropathy  Disposition: Brandon Barry appears stable.  He continues Xeloda/radiation.  He is scheduled to complete the course on 08/06/2019.  He will undergo a follow-up CT of the liver on 08/11/2019.  We reviewed the CBC from today.  He has mild anemia.  He will return for follow-up on 08/12/2019.  He will contact the office in the interim with any problems.    Ned Card ANP/GNP-BC   07/24/2019  3:17 PM

## 2019-07-25 ENCOUNTER — Ambulatory Visit
Admission: RE | Admit: 2019-07-25 | Discharge: 2019-07-25 | Disposition: A | Discharge status: Home / Self Care | Payer: Commercial Managed Care - PPO | Source: Ambulatory Visit | Attending: Radiation Oncology | Admitting: Radiation Oncology

## 2019-07-25 ENCOUNTER — Other Ambulatory Visit: Payer: Self-pay

## 2019-07-25 DIAGNOSIS — Z510 Encounter for antineoplastic radiation therapy: Secondary | ICD-10-CM | POA: Diagnosis not present

## 2019-07-28 ENCOUNTER — Other Ambulatory Visit: Payer: Self-pay

## 2019-07-28 ENCOUNTER — Ambulatory Visit
Admission: RE | Admit: 2019-07-28 | Discharge: 2019-07-28 | Disposition: A | Discharge status: Home / Self Care | Payer: Commercial Managed Care - PPO | Source: Ambulatory Visit | Attending: Radiation Oncology | Admitting: Radiation Oncology

## 2019-07-28 DIAGNOSIS — Z510 Encounter for antineoplastic radiation therapy: Secondary | ICD-10-CM | POA: Diagnosis not present

## 2019-07-29 ENCOUNTER — Telehealth: Payer: Self-pay | Admitting: Oncology

## 2019-07-29 ENCOUNTER — Ambulatory Visit
Admission: RE | Admit: 2019-07-29 | Discharge: 2019-07-29 | Disposition: A | Discharge status: Home / Self Care | Payer: Commercial Managed Care - PPO | Source: Ambulatory Visit | Attending: Radiation Oncology | Admitting: Radiation Oncology

## 2019-07-29 ENCOUNTER — Other Ambulatory Visit: Payer: Self-pay

## 2019-07-29 DIAGNOSIS — Z510 Encounter for antineoplastic radiation therapy: Secondary | ICD-10-CM | POA: Diagnosis not present

## 2019-07-29 NOTE — Telephone Encounter (Signed)
Scheduled per los. Called and left msg. mailed printout  

## 2019-07-30 ENCOUNTER — Telehealth: Payer: Self-pay | Admitting: *Deleted

## 2019-07-30 ENCOUNTER — Other Ambulatory Visit: Payer: Self-pay | Admitting: Nurse Practitioner

## 2019-07-30 ENCOUNTER — Ambulatory Visit
Admission: RE | Admit: 2019-07-30 | Discharge: 2019-07-30 | Disposition: A | Discharge status: Home / Self Care | Payer: Commercial Managed Care - PPO | Source: Ambulatory Visit | Attending: Radiation Oncology | Admitting: Radiation Oncology

## 2019-07-30 ENCOUNTER — Other Ambulatory Visit: Payer: Self-pay

## 2019-07-30 DIAGNOSIS — Z510 Encounter for antineoplastic radiation therapy: Secondary | ICD-10-CM | POA: Diagnosis not present

## 2019-07-30 DIAGNOSIS — C20 Malignant neoplasm of rectum: Secondary | ICD-10-CM

## 2019-07-30 MED ORDER — OXYCODONE-ACETAMINOPHEN 5-325 MG PO TABS: 1.0000 | ORAL_TABLET | Freq: Four times a day (QID) | ORAL | 0 refills | Status: DC | PRN

## 2019-07-30 MED FILL — OXYCODONE-APAP 5-325MG: 5-325 | 7 days supply | Qty: 30 | Fill #0

## 2019-07-30 NOTE — Telephone Encounter (Addendum)
Reports needing something stronger for pain hydrocodone and he just took his last one. Reports terrible pain outside his rectum and internally as well. "My hands and feet are killing me". Painful to walk on his feet-they are red, but not blistered. Would like to use [facility] for this. Per Dr. Benay Spice: OK to start oxycodone/apap 5/325 #1 every 6 hours prn. Hold Xeloda and see patient tomorrow when here for RT. Notified to p/u script today before 6 pm at [facility]. Will hold Xeloda and see Dr. Benay Spice at 3:45 pm tomorrow.

## 2019-07-31 ENCOUNTER — Other Ambulatory Visit: Payer: Self-pay

## 2019-07-31 ENCOUNTER — Ambulatory Visit
Admission: RE | Admit: 2019-07-31 | Discharge: 2019-07-31 | Disposition: A | Discharge status: Home / Self Care | Payer: Commercial Managed Care - PPO | Source: Ambulatory Visit | Attending: Radiation Oncology | Admitting: Radiation Oncology

## 2019-07-31 ENCOUNTER — Inpatient Hospital Stay (HOSPITAL_BASED_OUTPATIENT_CLINIC_OR_DEPARTMENT_OTHER): Payer: Commercial Managed Care - PPO | Admitting: Oncology

## 2019-07-31 VITALS — BP 137/92 | HR 84 | Temp 97.7°F | Resp 20 | Ht 72.0 in | Wt 162.0 lb

## 2019-07-31 DIAGNOSIS — C20 Malignant neoplasm of rectum: Secondary | ICD-10-CM | POA: Diagnosis not present

## 2019-07-31 DIAGNOSIS — Z510 Encounter for antineoplastic radiation therapy: Secondary | ICD-10-CM | POA: Diagnosis not present

## 2019-07-31 DIAGNOSIS — C19 Malignant neoplasm of rectosigmoid junction: Secondary | ICD-10-CM | POA: Diagnosis not present

## 2019-07-31 NOTE — Progress Notes (Signed)
[facility] OFFICE PROGRESS NOTE   Diagnosis: Rectal cancer  INTERVAL HISTORY:   Brandon Barry returns prior to a scheduled visit.  He continues radiation.  He has developed increased hand and foot pain over the past week.  The pain was not relieved with hydrocodone.  He was prescribed oxycodone yesterday and this helps.  He reports mild numbness in the fingers.  No mouth sores or diarrhea.  Intermittent rectal bleeding.  Objective:  Vital signs in last 24 hours:  Blood pressure (!) 137/92, pulse 84, temperature 97.7 F (36.5 C), temperature source Temporal, resp. rate 20, height 6' (1.829 m), weight 162 lb (73.5 kg), SpO2 99 %.      GI: No hepatosplenomegaly, nontender  Skin: Intense erythema over the palms without skin breakdown, skin thickening with callus formation and erythema at the soles.  No ulcers.  Portacath/PICC-without erythema  Lab Results:  Lab Results  Component Value Date   WBC 4.2 07/24/2019   HGB 12.3 (L) 07/24/2019   HCT 36.5 (L) 07/24/2019   MCV 93.1 07/24/2019   PLT 188 07/24/2019   NEUTROABS 3.2 07/24/2019    CMP  Lab Results  Component Value Date   NA 139 07/24/2019   K 3.6 07/24/2019   CL 107 07/24/2019   CO2 26 07/24/2019   GLUCOSE 100 (H) 07/24/2019   BUN 11 07/24/2019   CREATININE 0.82 07/24/2019   CALCIUM 8.9 07/24/2019   PROT 6.3 (L) 07/24/2019   ALBUMIN 3.9 07/24/2019   AST 26 07/24/2019   ALT 32 07/24/2019   ALKPHOS 92 07/24/2019   BILITOT 0.8 07/24/2019   GFRNONAA >60 07/24/2019   GFRAA >60 07/24/2019    Lab Results  Component Value Date   CEA1 4.36 02/19/2019     Medications: I have reviewed the patient's current medications.   Assessment/Plan: 1. Rectosigmoid cancer   Colonoscopy by Dr. Benson Norway 02/04/2019-fungating infiltrative and ulcerated nonobstructing large mass in the rectum. The mass was circumferential measuring 3 cm in length. There was no bleeding present. The mass was 10 cm from the anal  verge. Biopsy showed invasive adenocarcinoma, moderately differentiated; MLH1, MSH2, MSH6 and PMS2 intact.   CTs abdomen and pelvis 02/04/2019-apparent colorectal mass measuring 3.9 x 5.4 x 3.2 cm involving the distal sigmoid colon and proximal rectum with adjacent borderline enlarged suspicious mesorectal lymph nodes; indeterminate lesion in segment 6 of the liver measuring 1.4 x 1.1 cm.  MRI pelvis 02/13/2019-T3c, N1 tumor at 11.5 cm from the anal verge, 6.7 cm from the internal anal sphincter, 3 lymph nodes greater than 5 mm, largest 8 mm  CT chest 02/13/2019-negative for metastatic disease  MRI liver 02/13/2019-3.6 cm hemangioma in the posterior right hepatic lobe, 10 mm indeterminate lesion in the posterior right hepatic lobe-potentially an atypical hemangioma-solitary metastasis not excluded  Cycle 1 FOLFOX 02/26/2019  Cycle 2 FOLFOX 03/13/2019, Emend added  Cycle 3 FOLFOX 03/27/2019  Cycle 4 FOLFOX 04/10/2019  Cycle 5 FOLFOX 04/24/2019  Cycle 6 FOLFOX 05/07/2019  Cycle 7 FOLFOX 05/22/2019 (5-fluorouracil dose reduced)  Cycle 8 FOLFOX 06/09/2019  Capecitabine/radiation 06/30/2019  Capecitabine placed on hold with the p.m. dose on 07/30/2019 2. Change in bowel habits/rectal pain secondary to #1, improved 3. Port-A-Cath placement, Dr. Hassell Done, 02/21/2019 4. Early oxaliplatin neuropathy    Disposition: Brandon Barry is completing total neoadjuvant therapy.  He is now maintained on Xeloda/radiation and is scheduled to complete treatment on 08/06/2019.  He has developed pain related to hand/foot syndrome.  Xeloda was placed on hold  with the p.m. dose yesterday.  He will continue holding Xeloda and return for an office visit prior to radiation on 08/04/2019.  He will use oxycodone as needed for pain.  He knows not to drive while taking oxycodone.  Brandon Barry is scheduled for a restaging CT evaluation on 08/11/2019.  Betsy Coder, MD  07/31/2019  2:55 PM

## 2019-08-01 ENCOUNTER — Telehealth: Payer: Self-pay | Admitting: Oncology

## 2019-08-01 ENCOUNTER — Other Ambulatory Visit: Payer: Self-pay

## 2019-08-01 ENCOUNTER — Ambulatory Visit
Admission: RE | Admit: 2019-08-01 | Discharge: 2019-08-01 | Disposition: A | Discharge status: Home / Self Care | Payer: Commercial Managed Care - PPO | Source: Ambulatory Visit | Attending: Radiation Oncology | Admitting: Radiation Oncology

## 2019-08-01 DIAGNOSIS — Z510 Encounter for antineoplastic radiation therapy: Secondary | ICD-10-CM | POA: Diagnosis not present

## 2019-08-01 NOTE — Telephone Encounter (Signed)
Scheduled per los. Called and spoke with patient. Confirmed 2/22 appt

## 2019-08-04 ENCOUNTER — Other Ambulatory Visit: Payer: Self-pay

## 2019-08-04 ENCOUNTER — Ambulatory Visit
Admission: RE | Admit: 2019-08-04 | Discharge: 2019-08-04 | Disposition: A | Discharge status: Home / Self Care | Payer: Commercial Managed Care - PPO | Source: Ambulatory Visit | Attending: Radiation Oncology | Admitting: Radiation Oncology

## 2019-08-04 ENCOUNTER — Inpatient Hospital Stay (HOSPITAL_BASED_OUTPATIENT_CLINIC_OR_DEPARTMENT_OTHER): Payer: Commercial Managed Care - PPO | Admitting: Oncology

## 2019-08-04 VITALS — BP 133/93 | HR 80 | Temp 98.2°F | Resp 18 | Ht 72.0 in | Wt 162.4 lb

## 2019-08-04 DIAGNOSIS — C19 Malignant neoplasm of rectosigmoid junction: Secondary | ICD-10-CM | POA: Diagnosis not present

## 2019-08-04 DIAGNOSIS — C20 Malignant neoplasm of rectum: Secondary | ICD-10-CM

## 2019-08-04 DIAGNOSIS — Z510 Encounter for antineoplastic radiation therapy: Secondary | ICD-10-CM | POA: Diagnosis not present

## 2019-08-04 MED ORDER — HYDROCORTISONE (PERIANAL) 2.5 % EX CREA: 1.0000 "application " | TOPICAL_CREAM | Freq: Two times a day (BID) | CUTANEOUS | 0 refills | Status: DC

## 2019-08-04 NOTE — Progress Notes (Signed)
Brandon Barry   Diagnosis: Rectal cancer  INTERVAL HISTORY:   Brandon Barry returns as scheduled.  Capecitabine has been on hold since I saw him on 07/30/2019.  He reports improvement in hand/foot erythema and pain.  He has increased internal pain at the rectum with bowel movements.  He has intermittent rectal bleeding.  He has developed pain involving skin over the buttocks.  Objective:  Vital signs in last 24 hours:  Blood pressure (!) 133/93, pulse 80, temperature 98.2 F (36.8 C), temperature source Temporal, resp. rate 18, height 6' (1.829 m), weight 162 lb 6.4 oz (73.7 kg), SpO2 100 %.    Rectum: External exam reveals soft hemorrhoids at the anal verge, no tenderness surrounding the anus, no fluctuance Skin: Erythema and superficial skin breakdown at the medial gluteus bilaterally and upper gluteal fold  Portacath/PICC-without erythema   Lab Results:  Lab Results  Component Value Date   WBC 4.2 07/24/2019   HGB 12.3 (L) 07/24/2019   HCT 36.5 (L) 07/24/2019   MCV 93.1 07/24/2019   PLT 188 07/24/2019   NEUTROABS 3.2 07/24/2019    CMP  Lab Results  Component Value Date   NA 139 07/24/2019   K 3.6 07/24/2019   CL 107 07/24/2019   CO2 26 07/24/2019   GLUCOSE 100 (H) 07/24/2019   BUN 11 07/24/2019   CREATININE 0.82 07/24/2019   CALCIUM 8.9 07/24/2019   PROT 6.3 (L) 07/24/2019   ALBUMIN 3.9 07/24/2019   AST 26 07/24/2019   ALT 32 07/24/2019   ALKPHOS 92 07/24/2019   BILITOT 0.8 07/24/2019   GFRNONAA >60 07/24/2019   GFRAA >60 07/24/2019    Lab Results  Component Value Date   CEA1 4.36 02/19/2019     Medications: I have reviewed the patient's current medications.   Assessment/Plan:  1. Rectosigmoid cancer   Colonoscopy by Dr. Benson Norway 02/04/2019-fungating infiltrative and ulcerated nonobstructing large mass in the rectum. The mass was circumferential measuring 3 cm in length. There was no bleeding present. The mass was 10  cm from the anal verge. Biopsy showed invasive adenocarcinoma, moderately differentiated; MLH1, MSH2, MSH6 and PMS2 intact.   CTs abdomen and pelvis 02/04/2019-apparent colorectal mass measuring 3.9 x 5.4 x 3.2 cm involving the distal sigmoid colon and proximal rectum with adjacent borderline enlarged suspicious mesorectal lymph nodes; indeterminate lesion in segment 6 of the liver measuring 1.4 x 1.1 cm.  MRI pelvis 02/13/2019-T3c, N1 tumor at 11.5 cm from the anal verge, 6.7 cm from the internal anal sphincter, 3 lymph nodes greater than 5 mm, largest 8 mm  CT chest 02/13/2019-negative for metastatic disease  MRI liver 02/13/2019-3.6 cm hemangioma in the posterior right hepatic lobe, 10 mm indeterminate lesion in the posterior right hepatic lobe-potentially an atypical hemangioma-solitary metastasis not excluded  Cycle 1 FOLFOX 02/26/2019  Cycle 2 FOLFOX 03/13/2019, Emend added  Cycle 3 FOLFOX 03/27/2019  Cycle 4 FOLFOX 04/10/2019  Cycle 5 FOLFOX 04/24/2019  Cycle 6 FOLFOX 05/07/2019  Cycle 7 FOLFOX 05/22/2019 (5-fluorouracil dose reduced)  Cycle 8 FOLFOX 06/09/2019  Capecitabine/radiation 06/30/2019  Capecitabine placed on hold with the p.m. dose on 07/30/2019, 1000 mg twice daily  Capecitabine resumed with the p.m. dose on 08/04/2019 with a dose reduction 2. Change in bowel habits/rectal pain secondary to #1, improved 3. Port-A-Cath placement, Dr. Hassell Done, 02/21/2019 4. Early oxaliplatin neuropathy 5. Pain secondary to hand/foot syndrome, radiation skin toxicity, and radiation proctitis     Disposition: Brandon Barry continues rectal radiation.  The hand/foot symptoms have improved.  He has developed erythema and superficial skin breakdown at the medial gluteus bilaterally and upper gluteal fold.  He has external hemorrhoids.  I suspect the rectal pain is related to radiation proctitis.  He will resume Xeloda at a reduced dose for the days of radiation.  He will call for increased  hand/foot pain.  I recommend he try Anusol [facility] for the external hemorrhoids and Desitin for the gluteus skin change.  We will ask radiation oncology to evaluate him and recommend cream for the buttocks and treatment of the radiation proctitis.  Brandon Barry will return as scheduled on 08/12/2019.  Betsy Coder, MD  08/04/2019  4:02 PM

## 2019-08-05 ENCOUNTER — Other Ambulatory Visit: Payer: Self-pay

## 2019-08-05 ENCOUNTER — Ambulatory Visit
Admission: RE | Admit: 2019-08-05 | Discharge: 2019-08-05 | Disposition: A | Discharge status: Home / Self Care | Payer: Commercial Managed Care - PPO | Source: Ambulatory Visit | Attending: Radiation Oncology | Admitting: Radiation Oncology

## 2019-08-05 ENCOUNTER — Encounter: Payer: Self-pay | Admitting: Radiation Oncology

## 2019-08-05 DIAGNOSIS — Z510 Encounter for antineoplastic radiation therapy: Secondary | ICD-10-CM | POA: Diagnosis not present

## 2019-08-05 NOTE — Progress Notes (Signed)
Delayed entry- I saw the patient by Webex call yesterday to evaluate his skin along the sacral/gluteal cleft region. He has hyperpigmentation along this site and some blistering at the upper aspect. He has two more fractions to complete his radiotherapy and is receiving his boost at this time. He was given silvadene cream to apply but to let us know if he's had progressive symptoms. He can alternate with aquaphor as well once he's finished his treatment on Wednesday of this week. He has been taking Oxycodone for pain and will continue per Dr. Benay Spice as needed.

## 2019-08-06 ENCOUNTER — Other Ambulatory Visit: Payer: Self-pay | Admitting: Oncology

## 2019-08-06 ENCOUNTER — Ambulatory Visit
Admission: RE | Admit: 2019-08-06 | Discharge: 2019-08-06 | Disposition: A | Discharge status: Home / Self Care | Payer: Commercial Managed Care - PPO | Source: Ambulatory Visit | Attending: Radiation Oncology | Admitting: Radiation Oncology

## 2019-08-06 ENCOUNTER — Other Ambulatory Visit: Payer: Self-pay

## 2019-08-06 ENCOUNTER — Encounter: Payer: Self-pay | Admitting: Radiation Oncology

## 2019-08-06 DIAGNOSIS — C20 Malignant neoplasm of rectum: Secondary | ICD-10-CM

## 2019-08-06 DIAGNOSIS — Z510 Encounter for antineoplastic radiation therapy: Secondary | ICD-10-CM | POA: Diagnosis not present

## 2019-08-06 MED ORDER — OXYCODONE-ACETAMINOPHEN 5-325 MG PO TABS: 1.0000 | ORAL_TABLET | ORAL | 0 refills | Status: DC | PRN

## 2019-08-06 MED FILL — OXYCODONE-APAP 5-325MG: 5-325 | 10 days supply | Qty: 60 | Fill #0

## 2019-08-11 ENCOUNTER — Inpatient Hospital Stay: Payer: Commercial Managed Care - PPO

## 2019-08-11 ENCOUNTER — Other Ambulatory Visit: Payer: Self-pay

## 2019-08-11 ENCOUNTER — Ambulatory Visit (HOSPITAL_COMMUNITY)
Admission: RE | Admit: 2019-08-11 | Discharge: 2019-08-11 | Disposition: A | Discharge status: Home / Self Care | Payer: Commercial Managed Care - PPO | Source: Ambulatory Visit | Attending: Nurse Practitioner | Admitting: Nurse Practitioner

## 2019-08-11 ENCOUNTER — Ambulatory Visit: Payer: Commercial Managed Care - PPO | Admitting: Adult Health

## 2019-08-11 ENCOUNTER — Inpatient Hospital Stay: Payer: Commercial Managed Care - PPO | Attending: Nurse Practitioner

## 2019-08-11 DIAGNOSIS — G620 Drug-induced polyneuropathy: Secondary | ICD-10-CM | POA: Insufficient documentation

## 2019-08-11 DIAGNOSIS — C19 Malignant neoplasm of rectosigmoid junction: Secondary | ICD-10-CM | POA: Diagnosis present

## 2019-08-11 DIAGNOSIS — Z9221 Personal history of antineoplastic chemotherapy: Secondary | ICD-10-CM | POA: Insufficient documentation

## 2019-08-11 DIAGNOSIS — Z923 Personal history of irradiation: Secondary | ICD-10-CM | POA: Diagnosis not present

## 2019-08-11 DIAGNOSIS — C20 Malignant neoplasm of rectum: Secondary | ICD-10-CM

## 2019-08-11 DIAGNOSIS — Z452 Encounter for adjustment and management of vascular access device: Secondary | ICD-10-CM | POA: Diagnosis not present

## 2019-08-11 DIAGNOSIS — Z95828 Presence of other vascular implants and grafts: Secondary | ICD-10-CM

## 2019-08-11 LAB — CMP (CANCER CENTER ONLY)
ALT: 52 U/L — ABNORMAL HIGH (ref 0–44)
AST: 41 U/L (ref 15–41)
Albumin: 3.9 g/dL (ref 3.5–5.0)
Alkaline Phosphatase: 125 U/L (ref 38–126)
Anion gap: 6 (ref 5–15)
BUN: 9 mg/dL (ref 6–20)
CO2: 28 mmol/L (ref 22–32)
Calcium: 8.9 mg/dL (ref 8.9–10.3)
Chloride: 107 mmol/L (ref 98–111)
Creatinine: 0.79 mg/dL (ref 0.61–1.24)
GFR, Est AFR Am: >60 mL/min (ref >60)
GFR, Estimated: >60 mL/min (ref >60)
Glucose, Bld: 100 mg/dL — ABNORMAL HIGH (ref 70–99)
Potassium: 4 mmol/L (ref 3.5–5.1)
Sodium: 141 mmol/L (ref 135–145)
Total Bilirubin: 0.5 mg/dL (ref 0.3–1.2)
Total Protein: 6.7 g/dL (ref 6.5–8.1)

## 2019-08-11 LAB — CBC WITH DIFFERENTIAL (CANCER CENTER ONLY)
Abs Immature Granulocytes: 0.02 10*3/uL (ref 0.00–0.07)
Basophils Absolute: 0 10*3/uL (ref 0.0–0.1)
Basophils Relative: 1 %
Eosinophils Absolute: 0.1 10*3/uL (ref 0.0–0.5)
Eosinophils Relative: 3 %
HCT: 39.5 % (ref 39.0–52.0)
Hemoglobin: 12.8 g/dL — ABNORMAL LOW (ref 13.0–17.0)
Immature Granulocytes: 1 %
Lymphocytes Relative: 7 %
Lymphs Abs: 0.3 10*3/uL — ABNORMAL LOW (ref 0.7–4.0)
MCH: 30.5 pg (ref 26.0–34.0)
MCHC: 32.4 g/dL (ref 30.0–36.0)
MCV: 94 fL (ref 80.0–100.0)
Monocytes Absolute: 0.5 10*3/uL (ref 0.1–1.0)
Monocytes Relative: 12 %
Neutro Abs: 3.2 10*3/uL (ref 1.7–7.7)
Neutrophils Relative %: 76 %
Platelet Count: 231 10*3/uL (ref 150–400)
RBC: 4.2 MIL/uL — ABNORMAL LOW (ref 4.22–5.81)
RDW: 15.9 % — ABNORMAL HIGH (ref 11.5–15.5)
WBC Count: 4.1 10*3/uL (ref 4.0–10.5)
nRBC: 0 % (ref 0.0–0.2)

## 2019-08-11 MED ORDER — IOHEXOL 300 MG/ML  SOLN
100.0000 mL | Freq: Once | INTRAMUSCULAR | Status: AC | PRN
  Administered 2019-08-11: 100 mL via INTRAVENOUS

## 2019-08-11 MED ORDER — SODIUM CHLORIDE (PF) 0.9 % IJ SOLN
INTRAMUSCULAR | Status: AC
  Filled 2019-08-11: qty 50

## 2019-08-11 MED ORDER — HEPARIN SOD (PORK) LOCK FLUSH 100 UNIT/ML IV SOLN
INTRAVENOUS | Status: AC
  Filled 2019-08-11: qty 5

## 2019-08-11 MED ORDER — SODIUM CHLORIDE 0.9% FLUSH
10.0000 mL | INTRAVENOUS | Status: DC | PRN
  Administered 2019-08-11: 11:00:00 10 mL
  Filled 2019-08-11: qty 10

## 2019-08-11 MED ORDER — HEPARIN SOD (PORK) LOCK FLUSH 100 UNIT/ML IV SOLN: 500.0000 [IU] | Freq: Once | INTRAVENOUS | Status: DC

## 2019-08-12 ENCOUNTER — Other Ambulatory Visit: Payer: Self-pay

## 2019-08-12 ENCOUNTER — Inpatient Hospital Stay (HOSPITAL_BASED_OUTPATIENT_CLINIC_OR_DEPARTMENT_OTHER): Payer: Commercial Managed Care - PPO | Admitting: Oncology

## 2019-08-12 VITALS — BP 142/82 | HR 76 | Temp 97.8°F | Resp 18 | Ht 72.0 in | Wt 164.6 lb

## 2019-08-12 DIAGNOSIS — C20 Malignant neoplasm of rectum: Secondary | ICD-10-CM

## 2019-08-12 DIAGNOSIS — Z452 Encounter for adjustment and management of vascular access device: Secondary | ICD-10-CM | POA: Diagnosis not present

## 2019-08-12 NOTE — Progress Notes (Signed)
[place] OFFICE PROGRESS NOTE   Diagnosis: Rectal cancer  INTERVAL HISTORY:   Brandon Barry returns as scheduled.  He completed radiation on 08/06/2019.  He reports the skin breakdown at the buttocks is improved.  He continues to have rectal pain, worse with bowel movements.  This has also improved.  He is now taking oxycodone approximately every 6 hours.  No bleeding.  He reports numbness and tingling in the hands and feet.  Objective:  Vital signs in last 24 hours:  Blood pressure (!) 142/82, pulse 76, temperature 97.8 F (36.6 C), temperature source Oral, resp. rate 18, height 6' (1.829 m), weight 164 lb 9.6 oz (74.7 kg), SpO2 99 %.     GI: Nontender, no hepatosplenomegaly Vascular: No leg edema  Skin: Dryness at the palms and soles.  Hyperpigmentation and skin thickening at the soles.  Resolving skin breakdown at the upper buttock/gluteal fold, small external hemorrhoid  Portacath/PICC-without erythema  Lab Results:  Lab Results  Component Value Date   WBC 4.1 08/11/2019   HGB 12.8 (L) 08/11/2019   HCT 39.5 08/11/2019   MCV 94.0 08/11/2019   PLT 231 08/11/2019   NEUTROABS 3.2 08/11/2019    CMP  Lab Results  Component Value Date   NA 141 08/11/2019   K 4.0 08/11/2019   CL 107 08/11/2019   CO2 28 08/11/2019   GLUCOSE 100 (H) 08/11/2019   BUN 9 08/11/2019   CREATININE 0.79 08/11/2019   CALCIUM 8.9 08/11/2019   PROT 6.7 08/11/2019   ALBUMIN 3.9 08/11/2019   AST 41 08/11/2019   ALT 52 (H) 08/11/2019   ALKPHOS 125 08/11/2019   BILITOT 0.5 08/11/2019   GFRNONAA >60 08/11/2019   GFRAA >60 08/11/2019    Lab Results  Component Value Date   CEA1 4.36 02/19/2019    Imaging:  CT LIVER ABD W/WO  Result Date: 08/11/2019 CLINICAL DATA:  Follow-up colorectal carcinoma. Two RIGHT hepatic lobe lesions. EXAM: CT ABDOMEN WITHOUT AND WITH CONTRAST TECHNIQUE: Multidetector CT imaging of the abdomen was performed following the standard protocol before and  following the bolus administration of intravenous contrast. CONTRAST:  170m OMNIPAQUE IOHEXOL 300 MG/ML  SOLN COMPARISON:  CT 02/04/2019, MRI report 02/13/2019 FINDINGS: Lower chest: Lung bases clear Hepatobiliary: Peripheral enhancing hemangioma again noted subcapsular RIGHT hepatic lobe measuring 3.4 by 1.9 cm and not changed from comparison CT exam. Second smaller lesion in the RIGHT hepatic lobe less well-defined but unchanged in size measuring 8 mm (image 48/4) compared to 10 mm on comparison exam (CT 02/04/2019). No additional new lesions in LEFT or RIGHT hepatic lobe. Pancreas: Normal pancreas.  No duct dilatation or inflammation Spleen: Spleen is normal Adrenals/Urinary Tract: Normal adrenal glands. No enhancing renal lesion. Stomach/Bowel: Stomach limited view of the bowel is unremarkable. Vascular/Lymphatic: No upper abdominal lymphadenopathy. Abdominal aorta normal. Other: None Musculoskeletal: No aggressive osseous lesion IMPRESSION: 1. Stable subcapsular hemangioma RIGHT hepatic lobe. 2. Stable small indeterminate RIGHT hepatic lobe lesion unchanged in size from CT 02/04/2019. Electronically Signed   By: SSuzy BouchardM.D.   On: 08/11/2019 17:35    Medications: I have reviewed the patient's current medications.   Assessment/Plan: 1. Rectosigmoid cancer   Colonoscopy by Dr. HBenson Norway8/25/2020-fungating infiltrative and ulcerated nonobstructing large mass in the rectum. The mass was circumferential measuring 3 cm in length. There was no bleeding present. The mass was 10 cm from the anal verge. Biopsy showed invasive adenocarcinoma, moderately differentiated; MLH1, MSH2, MSH6 and PMS2 intact.   CTs  abdomen and pelvis 02/04/2019-apparent colorectal mass measuring 3.9 x 5.4 x 3.2 cm involving the distal sigmoid colon and proximal rectum with adjacent borderline enlarged suspicious mesorectal lymph nodes; indeterminate lesion in segment 6 of the liver measuring 1.4 x 1.1 cm.  MRI pelvis  02/13/2019-T3c, N1 tumor at 11.5 cm from the anal verge, 6.7 cm from the internal anal sphincter, 3 lymph nodes greater than 5 mm, largest 8 mm  CT chest 02/13/2019-negative for metastatic disease  MRI liver 02/13/2019-3.6 cm hemangioma in the posterior right hepatic lobe, 10 mm indeterminate lesion in the posterior right hepatic lobe-potentially an atypical hemangioma-solitary metastasis not excluded  Cycle 1 FOLFOX 02/26/2019  Cycle 2 FOLFOX 03/13/2019, Emend added  Cycle 3 FOLFOX 03/27/2019  Cycle 4 FOLFOX 04/10/2019  Cycle 5 FOLFOX 04/24/2019  Cycle 6 FOLFOX 05/07/2019  Cycle 7 FOLFOX 05/22/2019 (5-fluorouracil dose reduced)  Cycle 8 FOLFOX 06/09/2019  Capecitabine/radiation 06/30/2019-07/06/2019  Capecitabine placed on hold with the p.m. dose on 07/30/2019, 1000 mg twice daily  Capecitabine resumed with the p.m. dose on 08/04/2019 with a dose reduction 2. Change in bowel habits/rectal pain secondary to #1, improved 3. Port-A-Cath placement, Dr. Hassell Done, 02/21/2019 4. Early oxaliplatin neuropathy 5. Pain secondary to hand/foot syndrome, radiation skin toxicity, and radiation proctitis-improved   Disposition: Brandon Barry has completed the course of total neoadjuvant therapy.  He is scheduled to see Dr. [name] later this week.  I will review restaging plans with Dr. [name].  Hopefully the skin changes and pain will improve over the next few weeks.  He will wean the oxycodone as tolerated.  Brandon Barry will return for an office visit in approximately 3 weeks.  He has oxaliplatin neuropathy.  We will look for improvement over the next few months.  Betsy Coder, MD  08/12/2019  8:44 AM

## 2019-08-13 ENCOUNTER — Telehealth: Payer: Self-pay | Admitting: Oncology

## 2019-08-13 NOTE — Telephone Encounter (Signed)
Scheduled per los. Called and spoke with patient. Confirmed appt 

## 2019-08-15 NOTE — Progress Notes (Signed)
  Radiation Oncology         [phone] ________________________________  Name: Brandon Barry MRN: [id number]  Date: 06/19/2019  DOB: 24-Feb-1976   SIMULATION AND TREATMENT PLANNING NOTE  DIAGNOSIS:     ICD-10-CM   1. Cancer of rectum ([place])  C20      The patient presented for simulation for the patient's upcoming course of radiation for the diagnosis of rectal cancer. The patient was placed in a supine position. A customized vac-lock bag was constructed to aid in patient immobilization on. This complex treatment device will be used on a daily basis during the treatment. In this fashion a CT scan was obtained through the pelvic region and the isocenter was placed near midline within the pelvis. Surface markings were placed.  The patient's imaging was loaded into the radiation treatment planning system. The patient will initially be planned to receive a course of radiation to a dose of 45 Gy. This will be accomplished in 25 fractions at 1.8 gray per fraction. This initial treatment will correspond to a 3-D conformal technique. The target has been contoured in addition to the rectum, bladder and femoral heads. Dose volume histograms of each of these structures have been requested and these will be carefully reviewed as part of the 3-D conformal treatment planning process. To accomplish this initial treatment, 6 customized blocks have been designed for this purpose. Each of these 6 complex treatment devices will be used on a daily basis during the initial course of the treatment. It is anticipated that the patient will then receive a boost for an additional 5.4 Gy. The anticipated total dose therefore will be 50.4 Gy.    Special treatment procedure The patient will receive chemotherapy during the course of radiation treatment. The patient may experience increased or overlapping toxicity due to this combined-modality approach and the patient will be monitored for such problems. This may include  extra lab work as necessary. This therefore constitutes a special treatment procedure.    ________________________________  Jodelle Gross, MD, PhD

## 2019-08-15 NOTE — Progress Notes (Signed)
  Radiation Oncology         [phone] ________________________________  Name: Brandon Barry MRN: [id number]  Date: 06/19/2019  DOB: 1976/03/22  Optical Surface Tracking Plan:  Since intensity modulated radiotherapy (IMRT) and 3D conformal radiation treatment methods are predicated on accurate and precise positioning for treatment, intrafraction motion monitoring is medically necessary to ensure accurate and safe treatment delivery.  The ability to quantify intrafraction motion without excessive ionizing radiation dose can only be performed with optical surface tracking. Accordingly, surface imaging offers the opportunity to obtain 3D measurements of patient position throughout IMRT and 3D treatments without excessive radiation exposure.  I am ordering optical surface tracking for this patient's upcoming course of radiotherapy. ________________________________  Kyung Rudd, MD 08/15/2019 5:23 PM    Reference:   Particia Jasper, et al. Surface imaging-based analysis of intrafraction motion for breast radiotherapy patients.Journal of [place], n. 6, nov. 2014. ISSN [id number].   Available at: <http://www.jacmp.org/index.php/jacmp/article/view/4957>.

## 2019-08-25 ENCOUNTER — Telehealth: Payer: Self-pay | Admitting: *Deleted

## 2019-08-25 ENCOUNTER — Ambulatory Visit: Payer: Self-pay | Admitting: General Surgery

## 2019-08-25 MED ORDER — GABAPENTIN 300 MG PO CAPS: 300.0000 mg | ORAL_CAPSULE | Freq: Two times a day (BID) | ORAL | 1 refills | Status: DC

## 2019-08-25 NOTE — H&P (Signed)
History of Present Illness Brandon Ruff MD; 0000000 11:26 AM) The patient is a 44 year old male who presents with colorectal cancer. 44 year old male who presented to the office with a new diagnosis of rectal cancer. He had been having abdominal pain and weight loss for the past couple months. A colonoscopy was performed which showed a mid rectal mass. Biopsies reveal adenocarcinoma with possibility of Lynch syndrome. He has not had a CEA drawn. CT abdomen and pelvis show a rectal mass and a possible liver mass. MRI of the liver is indeterminate hemangioma versus metastatic disease. CT chest was negative. He underwent total neoadjuvant chemotherapy with radiation afterwards. Final Rt was 2/24. His lesion was tattooed.    Problem List/Past Medical Brandon Ruff, MD; 0000000 11:26 AM) RECTAL CANCER (C20)  Past Surgical History Brandon Ruff, MD; 0000000 11:26 AM) Knee Surgery Left. Oral Surgery  Diagnostic Studies History Brandon Ruff, MD; 0000000 11:26 AM) Colonoscopy within last year  Allergies Mammie Lorenzo, LPN; [id number] 075-GRM AM) No Known Drug Allergies [02/18/2019]:  Medication History Mammie Lorenzo, LPN; [id number] 075-GRM AM) oxyCODONE-Acetaminophen (5-325MG  Tablet, Oral) Active. Medications Reconciled  Social History Brandon Ruff, MD; 0000000 11:26 AM) Alcohol use Occasional alcohol use. Caffeine use Coffee, Tea. No drug use Tobacco use Former smoker.  Family History Brandon Ruff, MD; 0000000 11:26 AM) Colon Polyps Father. Hypertension Father. Migraine Headache Mother.  Other Problems Brandon Ruff, MD; 0000000 11:26 AM) Hemorrhoids Rectal Cancer Sleep Apnea     Review of Systems Brandon Ruff MD; 0000000 11:26 AM) General Present- Fatigue and Weight Loss. Not Present- Appetite Loss, Chills, Fever, Night Sweats and Weight Gain. Skin Not Present- Change in Wart/Mole, Dryness, Hives, Jaundice, New Lesions,  Non-Healing Wounds, Rash and Ulcer. HEENT Present- Seasonal Allergies. Not Present- Earache, Hearing Loss, Hoarseness, Nose Bleed, Oral Ulcers, Ringing in the Ears, Sinus Pain, Sore Throat, Visual Disturbances, Wears glasses/contact lenses and Yellow Eyes. Respiratory Not Present- Bloody sputum, Chronic Cough, Difficulty Breathing, Snoring and Wheezing. Breast Not Present- Breast Mass, Breast Pain, Nipple Discharge and Skin Changes. Cardiovascular Not Present- Chest Pain, Difficulty Breathing Lying Down, Leg Cramps, Palpitations, Rapid Heart Rate, Shortness of Breath and Swelling of Extremities. Gastrointestinal Present- Abdominal Pain, Change in Bowel Habits and Rectal Pain. Not Present- Bloating, Bloody Stool, Chronic diarrhea, Constipation, Difficulty Swallowing, Excessive gas, Gets full quickly at meals, Hemorrhoids, Indigestion, Nausea and Vomiting. Male Genitourinary Not Present- Blood in Urine, Change in Urinary Stream, Frequency, Impotence, Nocturia, Painful Urination, Urgency and Urine Leakage. Musculoskeletal Not Present- Back Pain, Joint Pain, Joint Stiffness, Muscle Pain, Muscle Weakness and Swelling of Extremities. Neurological Not Present- Decreased Memory, Fainting, Headaches, Numbness, Seizures, Tingling, Tremor, Trouble walking and Weakness. Psychiatric Not Present- Anxiety, Bipolar, Change in Sleep Pattern, Depression, Fearful and Frequent crying. Endocrine Not Present- Cold Intolerance, Excessive Hunger, Hair Changes, Heat Intolerance and New Diabetes. Hematology Not Present- Blood Thinners, Easy Bruising, Excessive bleeding, Gland problems, HIV and Persistent Infections.  Vitals Brandon Billings Dockery LPN; [id number] 075-GRM AM) 08/25/2019 10:58 AM Weight: 165.8 lb Height: 72in Body Surface Area: 1.97 m Body Mass Index: 22.49 kg/m  Temp.: 98.32F(Thermal Scan)  Pulse: 79 (Regular)  BP: 132/78 (Sitting, Left Arm, Standard)        Physical Exam Brandon Ruff MD;  0000000 11:27 AM)  General Mental Status-Alert. General Appearance-Cooperative.  Chest and Lung Exam Chest and lung exam reveals -quiet, even and easy respiratory effort with no use of accessory muscles and on auscultation, normal breath sounds, no adventitious sounds and normal vocal resonance.  Cardiovascular Cardiovascular  examination reveals -normal heart sounds, regular rate and rhythm with no murmurs.  Abdomen Palpation/Percussion Palpation and Percussion of the abdomen reveal - Soft and Non Tender.  Rectal Anorectal Exam Internal - Note: no masses palpated.   Results Brandon Ruff MD; 0000000 11:22 AM) Procedures  Name Value Date PROCTOSIGMOIDOSCOPY/DIAGNOSTIC [id number]) [ Rigid Sigmoidoscopy ] Physician: Patient status post neoadjuvant chemotherapy, here for evaluation of response. Preoperative Sigmoidoscope: After informed verbal consent, patient was placed in knee-chest position and anal canal was dilated digitally. Rigid sigmoidoscope was then inserted into the anal canal and advanced to the level of tattoo in the mid rectum. There was a small mass noted at the distal edge of the tattooed. There was signs of stricture and inflammatory response. There was no other sign of tumor along the mucosal surface. Tattoo edge was located at ~10-12 cm from anal sphincter complex.  Performed: 08/25/2019 11:09 AM    Assessment & Plan Brandon Ruff MD; 0000000 11:27 AM)  RECTAL CANCER (C20) Impression: 44 year old male who presents to the office today for evaluation of treatment response after TNT. He does have a symptomatic rectal stricture. This appears to be approximately 1 cm in diameter. On rigid proctoscopy. There is a small residual mass in the distal portion of the tattooed region. Distal tattoo rests approximately 12 cm from the anal verge. Given that he has a symptomatic stricture, I have recommended that he proceed with low anterior resection. I  think the likelihood of needing a diverting ileostomy is low. The surgery and anatomy were described to the patient as well as the risks of surgery and the possible complications. These include: Bleeding, deep abdominal infections and possible wound complications such as hernia and infection, damage to adjacent structures, leak of surgical connections, which can lead to other surgeries and possibly an ostomy, possible need for other procedures, such as abscess drains in radiology, possible prolonged hospital stay, possible diarrhea from removal of part of the colon, possible constipation from narcotics, possible bowel, bladder or sexual dysfunction if having rectal surgery, prolonged fatigue/weakness or appetite loss, possible early recurrence of of disease, possible complications of their medical problems such as heart disease or arrhythmias or lung problems, death (less than 1%). I believe the patient understands and wishes to proceed with the surgery.

## 2019-08-25 NOTE — Telephone Encounter (Signed)
Mrs Speyrer left a message stating Mr Cauldwell "feet hurt 100% of the time. Want to know if he can have something to help with nerve pain.

## 2019-08-28 ENCOUNTER — Other Ambulatory Visit: Payer: Self-pay | Admitting: General Surgery

## 2019-09-04 ENCOUNTER — Encounter: Payer: Self-pay | Admitting: Nurse Practitioner

## 2019-09-04 ENCOUNTER — Telehealth: Payer: Self-pay | Admitting: Radiation Oncology

## 2019-09-04 ENCOUNTER — Other Ambulatory Visit: Payer: Self-pay

## 2019-09-04 ENCOUNTER — Inpatient Hospital Stay: Payer: Commercial Managed Care - PPO

## 2019-09-04 ENCOUNTER — Inpatient Hospital Stay (HOSPITAL_BASED_OUTPATIENT_CLINIC_OR_DEPARTMENT_OTHER): Payer: Commercial Managed Care - PPO | Admitting: Nurse Practitioner

## 2019-09-04 VITALS — BP 144/95 | HR 77 | Temp 98.2°F | Resp 16 | Wt 163.0 lb

## 2019-09-04 DIAGNOSIS — C20 Malignant neoplasm of rectum: Secondary | ICD-10-CM

## 2019-09-04 DIAGNOSIS — Z452 Encounter for adjustment and management of vascular access device: Secondary | ICD-10-CM | POA: Diagnosis not present

## 2019-09-04 MED ORDER — OXYCODONE-ACETAMINOPHEN 5-325 MG PO TABS: 1.0000 | ORAL_TABLET | Freq: Four times a day (QID) | ORAL | 0 refills | Status: DC | PRN

## 2019-09-04 MED ORDER — HEPARIN SOD (PORK) LOCK FLUSH 100 UNIT/ML IV SOLN
500.0000 [IU] | Freq: Once | INTRAVENOUS | Status: AC | PRN
  Administered 2019-09-04: 500 [IU]
  Filled 2019-09-04: qty 5

## 2019-09-04 MED ORDER — SODIUM CHLORIDE 0.9% FLUSH
10.0000 mL | INTRAVENOUS | Status: DC | PRN
  Administered 2019-09-04: 10:00:00 10 mL
  Filled 2019-09-04: qty 10

## 2019-09-04 MED ORDER — GABAPENTIN 300 MG PO CAPS: 600.0000 mg | ORAL_CAPSULE | Freq: Two times a day (BID) | ORAL | 1 refills | Status: DC

## 2019-09-04 NOTE — Telephone Encounter (Signed)
  Radiation Oncology         [phone] ________________________________  Name: Brandon Barry MRN: [id number]  Date of Service: 09/04/2019  DOB: [birth year]  Post Treatment Telephone Note  Diagnosis:  Stage IIIB, cT3cN1M0 adenocarcinoma of the rectum.   Interval Since Last Radiation: 4 weeks   06/30/19-08/06/19: The rectum was treated to 45 Gy in 25 fractions followed by a 5.4 Gy boost in 3 fractions.   Narrative:  The patient was contacted today for routine follow-up. During treatment he did very well with radiotherapy and but did develop modest desquamation. He reports he is feeling back to normal at this time and reports his skin has almost completely resolved.  Impression/Plan: 1.  Stage IIIB, cT3cN1M0 adenocarcinoma of the rectum. The patient has been doing well since completion of radiotherapy. We discussed that we would be happy to continue to follow him as needed, but he will also continue to follow up with Dr. [name] for surgery on 10/15/19 and with Dr. Benay Spice  in medical oncology.     Carola Rhine, PAC

## 2019-09-04 NOTE — Progress Notes (Addendum)
Brandon Barry OFFICE PROGRESS NOTE   Diagnosis: Rectal cancer  INTERVAL HISTORY:   Brandon Barry returns as scheduled.  He began a trial of Neurontin 300 mg twice daily on 08/25/2019 for painful neuropathy symptoms involving the feet.  He has noted mild improvement since beginning Neurontin.  Hands with similar symptoms but not as severe.  He occasionally takes Percocet for the pain.  Bowels are moving.  No nausea or vomiting.  Objective:  Vital signs in last 24 hours:  Blood pressure (!) 144/95, pulse 77, temperature 98.2 F (36.8 C), temperature source Temporal, resp. rate 16, weight 163 lb (73.9 kg), SpO2 100 %.    HEENT: No thrush or ulcers. Resp: Lungs clear bilaterally. Cardio: Regular rate and rhythm. GI: Abdomen soft and nontender.  No hepatomegaly. Vascular: No leg edema. Skin: Palms without erythema. Port-A-Cath without erythema.   Lab Results:  Lab Results  Component Value Date   WBC 4.1 08/11/2019   HGB 12.8 (L) 08/11/2019   HCT 39.5 08/11/2019   MCV 94.0 08/11/2019   PLT 231 08/11/2019   NEUTROABS 3.2 08/11/2019    Imaging:  No results found.  Medications: I have reviewed the patient's current medications.  Assessment/Plan: 1. Rectosigmoid cancer   Colonoscopy by Dr. Benson Norway 02/04/2019-fungating infiltrative and ulcerated nonobstructing large mass in the rectum. The mass was circumferential measuring 3 cm in length. There was no bleeding present. The mass was 10 cm from the anal verge. Biopsy showed invasive adenocarcinoma, moderately differentiated; MLH1, MSH2, MSH6 and PMS2 intact.   CTs abdomen and pelvis 02/04/2019-apparent colorectal mass measuring 3.9 x 5.4 x 3.2 cm involving the distal sigmoid colon and proximal rectum with adjacent borderline enlarged suspicious mesorectal lymph nodes; indeterminate lesion in segment 6 of the liver measuring 1.4 x 1.1 cm.  MRI pelvis 02/13/2019-T3c, N1 tumor at 11.5 cm from the anal verge, 6.7 cm from  the internal anal sphincter, 3 lymph nodes greater than 5 mm, largest 8 mm  CT chest 02/13/2019-negative for metastatic disease  MRI liver 02/13/2019-3.6 cm hemangioma in the posterior right hepatic lobe, 10 mm indeterminate lesion in the posterior right hepatic lobe-potentially an atypical hemangioma-solitary metastasis not excluded  Cycle 1 FOLFOX 02/26/2019  Cycle 2 FOLFOX 03/13/2019, Emend added  Cycle 3 FOLFOX 03/27/2019  Cycle 4 FOLFOX 04/10/2019  Cycle 5 FOLFOX 04/24/2019  Cycle 6 FOLFOX 05/07/2019  Cycle 7 FOLFOX 05/22/2019 (5-fluorouracil dose reduced)  Cycle 8 FOLFOX 06/09/2019  Capecitabine/radiation 06/30/2019-07/06/2019  Capecitabine placed on hold with the p.m. dose on 07/30/2019, 1000 mg twice daily  Capecitabine resumed with the p.m. dose on 08/04/2019 with a dose reduction  CT liver 08/11/2019-stable subcapsular hemangioma right hepatic lobe.  Stable small indeterminate right hepatic lobe lesion unchanged in size from CT 02/04/2019. 2. Change in bowel habits/rectal pain secondary to #1, improved 3. Port-A-Cath placement, Dr. Hassell Done, 02/21/2019 4. Early oxaliplatin neuropathy-trial of Neurontin initiated 08/25/2019 due to foot pain 5. Pain secondary to hand/foot syndrome, radiation skin toxicity, and radiation proctitis-improved  Disposition: Brandon Barry appears stable.  He is scheduled for surgery, low anterior resection planned, with Dr. [name] on 10/30/2019.    He has persistent painful neuropathy involving the hands and feet.  He understands this is most likely related to previous oxaliplatin and will hopefully improve over time.  He has noted mild improvement since beginning gabapentin.  He will increase the dose to 600 mg twice daily.  He occasionally takes Percocet for the pain and would like a refill.  He understands  he should not be driving while taking any medications that can cause sedation.  He will return for reevaluation in 3 weeks.  He will contact the office in  the interim with any problems.  Patient seen with Dr. Benay Spice.    Ned Card ANP/GNP-BC   09/04/2019  9:48 AM This was a shared visit with Ned Card.  Brandon Barry has experienced partial improvement in neuropathy symptoms with gabapentin.  The dose will be increased.  He will decrease the use of oxycodone as tolerated.  Julieanne Manson, MD

## 2019-09-05 ENCOUNTER — Telehealth: Payer: Self-pay | Admitting: Oncology

## 2019-09-05 NOTE — Telephone Encounter (Signed)
Scheduled per los. Called and spoke with patient. Confirmed appt 

## 2019-09-25 ENCOUNTER — Other Ambulatory Visit: Payer: Self-pay

## 2019-09-25 ENCOUNTER — Inpatient Hospital Stay: Payer: Commercial Managed Care - PPO | Attending: Nurse Practitioner | Admitting: Oncology

## 2019-09-25 ENCOUNTER — Encounter: Payer: Self-pay | Admitting: Adult Health

## 2019-09-25 ENCOUNTER — Ambulatory Visit (INDEPENDENT_AMBULATORY_CARE_PROVIDER_SITE_OTHER): Payer: Commercial Managed Care - PPO | Admitting: Adult Health

## 2019-09-25 VITALS — BP 142/88 | HR 65 | Temp 97.7°F | Ht 72.0 in | Wt 166.0 lb

## 2019-09-25 VITALS — BP 138/92 | HR 92 | Temp 98.5°F | Resp 16 | Wt 169.1 lb

## 2019-09-25 DIAGNOSIS — Z9221 Personal history of antineoplastic chemotherapy: Secondary | ICD-10-CM | POA: Diagnosis not present

## 2019-09-25 DIAGNOSIS — T451X5A Adverse effect of antineoplastic and immunosuppressive drugs, initial encounter: Secondary | ICD-10-CM | POA: Insufficient documentation

## 2019-09-25 DIAGNOSIS — G620 Drug-induced polyneuropathy: Secondary | ICD-10-CM | POA: Diagnosis not present

## 2019-09-25 DIAGNOSIS — K6289 Other specified diseases of anus and rectum: Secondary | ICD-10-CM | POA: Insufficient documentation

## 2019-09-25 DIAGNOSIS — R194 Change in bowel habit: Secondary | ICD-10-CM | POA: Insufficient documentation

## 2019-09-25 DIAGNOSIS — C20 Malignant neoplasm of rectum: Secondary | ICD-10-CM | POA: Diagnosis not present

## 2019-09-25 DIAGNOSIS — G4733 Obstructive sleep apnea (adult) (pediatric): Secondary | ICD-10-CM

## 2019-09-25 DIAGNOSIS — C19 Malignant neoplasm of rectosigmoid junction: Secondary | ICD-10-CM | POA: Diagnosis not present

## 2019-09-25 DIAGNOSIS — Z9989 Dependence on other enabling machines and devices: Secondary | ICD-10-CM

## 2019-09-25 NOTE — Patient Instructions (Signed)
Your Plan:  Continue CPAP Speak to oncologist about referral for possible neuropathy If your symptoms worsen or you develop new symptoms please let us know.   Thank you for coming to see [place] at [facility]. I hope we have been able to provide you high quality care today.  You may receive a patient satisfaction survey over the next few weeks. We would appreciate your feedback and comments so that we may continue to improve ourselves and the health of our patients.

## 2019-09-25 NOTE — Progress Notes (Addendum)
PATIENT: Brandon Barry DOB: 03-07-1976  REASON FOR VISIT: follow up HISTORY FROM: patient  HISTORY OF PRESENT ILLNESS: Today 09/25/19:  Brandon Barry is a 44 year old male with a history of obstructive sleep apnea on CPAP.  His download indicates that he use his machine 29 out of 30 days for compliance of 97%.  He uses machine greater than 4 hours each night.  On average he uses his machine 7 hours and 6 minutes.  His residual AHI is 0.3 on 8 cm of water with EPR 3.  Leak in the 95th percentile is 2.5.  Reports that the CPAP continues to work well for him.  He does have a diagnosis of rectal cancer.  He is followed by oncology.  Will be having surgery in May.  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  Reports that he has discomfort in the hands and the feet.  HISTORY 08/06/2018: I reviewed his CPAP compliance data from 07/06/2018 through 08/04/2018 which is a total of 30 days, during which time he used his CPAP every night with percent used days greater than 4 hours at 100%, indicating superb compliance with an average usage of 7 hours and 16 minutes, residual AHI at goal at 0.8 per hour, leak on the low side with the 95th percentile at 10 L/m on a pressure of 8 cm with EPR of 3. He reports CPAP is going well. He continues to benefit from it. He has not had any significant restless legs flareup. He has the occasional symptom, maybe some leg movements at night but nothing bothersome for now. He does change the filter on a regular basis on his CPAP machine but has not had any new nasal pillows.   REVIEW OF SYSTEMS: Out of a complete 14 system review of symptoms, the patient complains only of the following symptoms, and all other reviewed systems are negative.  FSS 23  ESS 4  ALLERGIES: No Known Allergies  HOME MEDICATIONS: Outpatient Medications Prior to Visit  Medication Sig Dispense Refill  . gabapentin (NEURONTIN) 300 MG capsule Take 2 capsules (600  mg total) by mouth 2 (two) times daily. 120 capsule 1  . lidocaine-prilocaine (EMLA) cream Apply 1 application topically as directed. Apply 1 hour prior to stick and cover with plastic wrap 30 g 5  . LORazepam (ATIVAN) 0.5 MG tablet Take 1 tablet (0.5 mg total) by mouth every 8 (eight) hours as needed for anxiety. Or nausea. Do not drive while taking 30 tablet 0  . oxyCODONE-acetaminophen (PERCOCET/ROXICET) 5-325 MG tablet Take 1 tablet by mouth every 6 (six) hours as needed for severe pain. 50 tablet 0  . polyethylene glycol (MIRALAX / GLYCOLAX) 17 g packet Take 17 g by mouth daily.     . prochlorperazine (COMPAZINE) 10 MG tablet Take 1 tablet (10 mg total) by mouth every 6 (six) hours as needed for nausea. (Patient not taking: Reported on 09/04/2019) 60 tablet 1   No facility-administered medications prior to visit.    PAST MEDICAL HISTORY: Past Medical History:  Diagnosis Date  . Chronic back pain    No current issues 02/20/2019  . Hyperlipidemia   . OSA on CPAP   . Rectal adenocarcinoma ([place])   . Right shoulder pain     PAST SURGICAL HISTORY: Past Surgical History:  Procedure Laterality Date  . COLONOSCOPY  02/04/2019  . KNEE ARTHROSCOPY Left 11/19/2018  . PORTACATH PLACEMENT Left 02/21/2019   Procedure: INSERTION PORT-A-CATH;  Surgeon:  Johnathan Hausen, MD;  Location: [facility];  Service: General;  Laterality: Left;  . WISDOM TOOTH EXTRACTION      FAMILY HISTORY: Family History  Problem Relation Age of Onset  . Heart attack Father   . Cancer Paternal Grandmother   . Cancer Paternal Grandfather     SOCIAL HISTORY: Social History   Socioeconomic History  . Marital status: Married    Spouse name: Not on file  . Number of children: Not on file  . Years of education: Not on file  . Highest education level: Not on file  Occupational History  . Not on file  Tobacco Use  . Smoking status: Former Smoker    Types: Cigarettes  . Smokeless tobacco: Never Used  . Tobacco  comment: quit over 10 years ago  Substance and Sexual Activity  . Alcohol use: Yes    Comment: 3-4 beers weekly  . Drug use: No  . Sexual activity: Not on file  Other Topics Concern  . Not on file  Social History Narrative  . Not on file   Social Determinants of Health   Financial Resource Strain:   . Difficulty of Paying Living Expenses:   Food Insecurity:   . Worried About Charity fundraiser in the Last Year:   . Arboriculturist in the Last Year:   Transportation Needs:   . Film/video editor (Medical):   Marland Kitchen Lack of Transportation (Non-Medical):   Physical Activity:   . Days of Exercise per Week:   . Minutes of Exercise per Session:   Stress:   . Feeling of Stress :   Social Connections:   . Frequency of Communication with Friends and Family:   . Frequency of Social Gatherings with Friends and Family:   . Attends Religious Services:   . Active Member of Clubs or Organizations:   . Attends Archivist Meetings:   Marland Kitchen Marital Status:   Intimate Partner Violence:   . Fear of Current or Ex-Partner:   . Emotionally Abused:   Marland Kitchen Physically Abused:   . Sexually Abused:       PHYSICAL EXAM  Vitals:   09/25/19 0820  BP: (!) 142/88  Pulse: 65  Temp: 97.7 F (36.5 C)  Weight: 166 lb (75.3 kg)  Height: 6' (1.829 m)   Body mass index is 22.51 kg/m.  Generalized: Well developed, in no acute distress  Chest: Lungs clear to auscultation bilaterally  Neurological examination  Mentation: Alert oriented to time, place, history taking. Follows all commands speech and language fluent Cranial nerve II-XII: Extraocular movements were full, visual field were full on confrontational test Head turning and shoulder shrug  were normal and symmetric. Motor: The motor testing reveals 5 over 5 strength of all 4 extremities. Good symmetric motor tone is noted throughout.  Sensory: Sensory testing is intact to soft touch on all 4 extremities. No evidence of extinction is  noted.  Gait and station: Gait is normal.    DIAGNOSTIC DATA (LABS, IMAGING, TESTING) - I reviewed patient records, labs, notes, testing and imaging myself where available.  Lab Results  Component Value Date   WBC 4.1 08/11/2019   HGB 12.8 (L) 08/11/2019   HCT 39.5 08/11/2019   MCV 94.0 08/11/2019   PLT 231 08/11/2019      Component Value Date/Time   NA 141 08/11/2019 1050   K 4.0 08/11/2019 1050   CL 107 08/11/2019 1050   CO2 28 08/11/2019 1050   GLUCOSE  100 (H) 08/11/2019 1050   BUN 9 08/11/2019 1050   CREATININE 0.79 08/11/2019 1050   CALCIUM 8.9 08/11/2019 1050   PROT 6.7 08/11/2019 1050   ALBUMIN 3.9 08/11/2019 1050   AST 41 08/11/2019 1050   ALT 52 (H) 08/11/2019 1050   ALKPHOS 125 08/11/2019 1050   BILITOT 0.5 08/11/2019 1050   GFRNONAA >60 08/11/2019 1050   GFRAA >60 08/11/2019 1050   No results found for: CHOL, HDL, LDLCALC, LDLDIRECT, TRIG, CHOLHDL No results found for: HGBA1C No results found for: VITAMINB12 No results found for: TSH    ASSESSMENT AND PLAN 44 y.o. year old male  has a past medical history of Chronic back pain, Hyperlipidemia, OSA on CPAP, Rectal adenocarcinoma ([place]), and Right shoulder pain. here with:  1. OSA on CPAP  - CPAP compliance excellent - Good treatment of AHI  - Encourage patient to use CPAP nightly and > 4 hours each night -Advised patient to have his oncologist send over a referral for possible neuropathy and we will get him set up with one of our neurologist. - F/U in 1 year or sooner if needed   I spent 25 minutes of face-to-face and non-face-to-face time with patient.  This included previsit chart review, lab review, study review, order entry, electronic health record documentation, patient education.  Ward Givens, MSN, NP-C 09/25/2019, 8:14 AM [facility] [address], [place], [place] [address] [phone]  I reviewed the above note and documentation by the Nurse Practitioner and  agree with the history, exam, assessment and plan as outlined above. I was available for consultation. Star Age, MD, PhD [facility])

## 2019-09-25 NOTE — Progress Notes (Signed)
[place] OFFICE PROGRESS NOTE   Diagnosis: Rectal cancer  INTERVAL HISTORY:   Mr. Bordelon returns as scheduled.  He continues gabapentin for treatment of oxaliplatin neuropathy.  He reports this has helped partially.  He has not developed  somnolence while on gabapentin.  He continues to have numbness and a cold sensation in the fingers and toes.  He has pain in the feet.  He takes oxycodone occasionally for pain. He has leakage of stool when he urinates.  Mr. Belson reports he saw Dr. [name] and is scheduled for rectal surgery on 10/15/2019.  Objective:  Vital signs in last 24 hours:  There were no vitals taken for this visit.    Lymphatics: No cervical, supraclavicular, axillary, or inguinal nodes GI: No hepatomegaly, no mass, nontender Vascular: No leg edema   Portacath/PICC-without erythema  Lab Results:  Lab Results  Component Value Date   WBC 4.1 08/11/2019   HGB 12.8 (L) 08/11/2019   HCT 39.5 08/11/2019   MCV 94.0 08/11/2019   PLT 231 08/11/2019   NEUTROABS 3.2 08/11/2019    CMP  Lab Results  Component Value Date   NA 141 08/11/2019   K 4.0 08/11/2019   CL 107 08/11/2019   CO2 28 08/11/2019   GLUCOSE 100 (H) 08/11/2019   BUN 9 08/11/2019   CREATININE 0.79 08/11/2019   CALCIUM 8.9 08/11/2019   PROT 6.7 08/11/2019   ALBUMIN 3.9 08/11/2019   AST 41 08/11/2019   ALT 52 (H) 08/11/2019   ALKPHOS 125 08/11/2019   BILITOT 0.5 08/11/2019   GFRNONAA >60 08/11/2019   GFRAA >60 08/11/2019    Lab Results  Component Value Date   CEA1 4.36 02/19/2019    No results found for: INR  Imaging:  No results found.  Medications: I have reviewed the patient's current medications.   Assessment/Plan:  1. Rectosigmoid cancer   Colonoscopy by Dr. Benson Norway 02/04/2019-fungating infiltrative and ulcerated nonobstructing large mass in the rectum. The mass was circumferential measuring 3 cm in length. There was no bleeding present. The mass was 10 cm from  the anal verge. Biopsy showed invasive adenocarcinoma, moderately differentiated; MLH1, MSH2, MSH6 and PMS2 intact.   CTs abdomen and pelvis 02/04/2019-apparent colorectal mass measuring 3.9 x 5.4 x 3.2 cm involving the distal sigmoid colon and proximal rectum with adjacent borderline enlarged suspicious mesorectal lymph nodes; indeterminate lesion in segment 6 of the liver measuring 1.4 x 1.1 cm.  MRI pelvis 02/13/2019-T3c, N1 tumor at 11.5 cm from the anal verge, 6.7 cm from the internal anal sphincter, 3 lymph nodes greater than 5 mm, largest 8 mm  CT chest 02/13/2019-negative for metastatic disease  MRI liver 02/13/2019-3.6 cm hemangioma in the posterior right hepatic lobe, 10 mm indeterminate lesion in the posterior right hepatic lobe-potentially an atypical hemangioma-solitary metastasis not excluded  Cycle 1 FOLFOX 02/26/2019  Cycle 2 FOLFOX 03/13/2019, Emend added  Cycle 3 FOLFOX 03/27/2019  Cycle 4 FOLFOX 04/10/2019  Cycle 5 FOLFOX 04/24/2019  Cycle 6 FOLFOX 05/07/2019  Cycle 7 FOLFOX 05/22/2019 (5-fluorouracil dose reduced)  Cycle 8 FOLFOX 06/09/2019  Capecitabine/radiation 06/30/2019-07/06/2019  Capecitabine placed on hold with the p.m. dose on 07/30/2019, 1000 mg twice daily  Capecitabine resumed with the p.m. dose on 08/04/2019 with a dose reduction  CT liver 08/11/2019-stable subcapsular hemangioma right hepatic lobe.  Stable small indeterminate right hepatic lobe lesion unchanged in size from CT 02/04/2019. 2. Change in bowel habits/rectal pain secondary to #1, improved 3. Port-A-Cath placement, Dr. Hassell Done, 02/21/2019 4.  Early oxaliplatin neuropathy-trial of Neurontin initiated 08/25/2019 due to foot pain 5. Pain secondary to hand/foot syndrome, radiation skin toxicity, and radiation proctitis-improved   Disposition: Mr. Grismer reports resolution of rectal pain following total neoadjuvant therapy.  He saw Dr. [name] and is scheduled for rectal surgery on 10/15/2019.  I made a  referral to pelvic physical therapy.  He will continue gabapentin for the oxaliplatin neuropathy.  Hopefully the neuropathy symptoms will improve over the next few months.  He will return for an office visit during the week of 10/27/2019.  Betsy Coder, MD  09/25/2019  12:47 PM

## 2019-09-30 ENCOUNTER — Telehealth: Payer: Self-pay | Admitting: Oncology

## 2019-09-30 NOTE — Telephone Encounter (Signed)
Scheduled per los. Called and spoke with patient. Confirmed appt 

## 2019-10-08 ENCOUNTER — Encounter: Payer: Self-pay | Admitting: Physical Therapy

## 2019-10-08 ENCOUNTER — Ambulatory Visit: Payer: Commercial Managed Care - PPO | Attending: Oncology | Admitting: Physical Therapy

## 2019-10-08 ENCOUNTER — Other Ambulatory Visit: Payer: Self-pay

## 2019-10-08 DIAGNOSIS — M6281 Muscle weakness (generalized): Secondary | ICD-10-CM | POA: Insufficient documentation

## 2019-10-08 DIAGNOSIS — R252 Cramp and spasm: Secondary | ICD-10-CM | POA: Insufficient documentation

## 2019-10-08 NOTE — Therapy (Addendum)
[facility] [address], [place], [place], [address] Phone: [phone]   Fax:  [phone]  Physical Therapy Evaluation  Patient Details  Name: Brandon Barry MRN: [id number] Date of Birth: 1976/03/29 Referring Provider (PT): Ladell Pier, MD   Encounter Date: 10/08/2019  PT End of Session - 10/08/19 1518    Visit Number  1    Date for PT Re-Evaluation  12/31/19    PT Start Time  1231    PT Stop Time  1313    PT Time Calculation (min)  42 min    Activity Tolerance  Patient tolerated treatment well    Behavior During Therapy  [facility] for tasks assessed/performed       Past Medical History:  Diagnosis Date  . Chronic back pain    No current issues 02/20/2019  . Hyperlipidemia   . OSA on CPAP   . Peripheral neuropathy    Hands and feet from Chemo  . Rectal adenocarcinoma ([place])   . Right shoulder pain     Past Surgical History:  Procedure Laterality Date  . COLONOSCOPY  02/04/2019  . KNEE ARTHROSCOPY Left 11/19/2018  . PORTACATH PLACEMENT Left 02/21/2019   Procedure: INSERTION PORT-A-CATH;  Surgeon: Johnathan Hausen, MD;  Location: [facility];  Service: General;  Laterality: Left;  . WISDOM TOOTH EXTRACTION      There were no vitals filed for this visit.   Subjective Assessment - 10/08/19 1234    Subjective  I have leakage at least once/day and leakage sometimes I can tell it happened and sometimes not.  I sometimes just want to urinate and then have a BM also.  Pt reports neuropathy in feet and hand, feet are worse.    Patient Stated Goals  control BM    Currently in Pain?  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, leaning      Cognition   Overall Cognitive Status  Within Functional Limits for tasks assessed      Posture/Postural Control   Posture/Postural Control  Postural limitations    Postural Limitations  Rounded Shoulders;Decreased thoracic kyphosis      AROM   Overall AROM Comments  lumbar flexion 30% limited      PROM   Overall PROM Comments  hip 30% limited bilat; Lt more tension throughout      Strength   Overall Strength Comments  Rt hip abduction 4+/5      Flexibility   Soft Tissue Assessment /Muscle Length  yes    Hamstrings  30 deg bilat      Palpation   Palpation comment  lumbar and  throracic paraspinlas tight      Ambulation/Gait   Gait Pattern  Decreased stride length                Objective measurements completed on examination: See above findings.    Pelvic Floor Special Questions - 10/09/19 0001    Prior Pelvic/Prostate Exam  Yes    Fecal incontinence  Yes   BM 3+/day; leak 1+/day   Pelvic Floor Internal Exam  pt identity confirmed and informed consent given to perform internal assessment/treat    Exam Type  Rectal    Palpation  very tight anal sphincters and puborectalis; requires a lot of tactile and verbal cues for deep breathing techniques; unable to bulge more than very small amount    Strength  fair squeeze, definite lift    Strength # of reps  2    Strength # of seconds  5    Tone  high       [facility] Adult PT Treatment/Exercise - 10/09/19 0001      Self-Care   Self-Care  Other Self-Care Comments    Other Self-Care Comments   intial HEP and toileting techniques             PT Education - 10/09/19 1609     Education Details  Access Code: [email or web address] toileting techniques    Person(s) Educated  Patient    Methods  Explanation;Demonstration;Verbal cues;Handout;Tactile cues    Comprehension  Verbalized understanding;Returned demonstration          PT Long Term Goals - 10/09/19 1610      PT LONG TERM GOAL #1   Title  ind with HEP    Time  12    Period  Weeks    Status  New    [place] Date  12/31/19      PT LONG TERM GOAL #2   Title  pt will report has to have BM 2 or lessx/day due to improved muscle tone    Time  12    Period  Weeks    Status  New    [place] Date  12/31/19      PT LONG TERM GOAL #3   Title  pt will be able to have complete BM with diameter of stool at least size of a quarter    Time  12    Period  Weeks    Status  New    [place] Date  12/31/19      PT LONG TERM GOAL #4   Title  --             Plan - 10/09/19 1600    Clinical Impression Statement  Pt presents to clinic today due to history of rectal cancer.  He has had radiation therapy to treat tumor and upcoming surery to remove growth in rectum.  Pt comes to PT to work on fecal incontinence.  This has improved but he continues to have issues with leakage and frequncy of stool . Pt denies urinary incontinence.  Pt has very high tone pelvic floor with 3/5 MMT and can hold for 5 seconds.  pt has very limited abilty to bulge and lengthen pelvic floor.  Pt has tight hamstrings and lumbar paraspinals.  Pt demonstrates minor hip weakness and posture abnormalities as mentioned above.    Personal Factors and Comorbidities  Comorbidity 2    Comorbidities  upcoming surgery; rectal cancer; radiation    Examination-Activity Limitations  Toileting;Continence    Stability/Clinical Decision Making  Evolving/Moderate complexity    Clinical Decision Making  Moderate    Rehab Potential  Excellent    PT Frequency  2x / week    PT Duration  12 weeks    PT Treatment/Interventions  ADLs/Self Care Home  Management;Biofeedback;Cryotherapy;Electrical Stimulation;Moist Heat;Neuromuscular re-education;Therapeutic exercise;Therapeutic activities;Patient/family education;Manual techniques;Dry needling;Passive range of motion;Taping    PT Next Visit Plan  LE and lumbar stretch; re-assess post surgery; bulging; toileting techniques reviewed    PT Home Exercise Plan  Access Code: [email or web address]    Consulted and Agree with Plan of Care  Patient       Patient will benefit from skilled therapeutic intervention in order to improve the following deficits and impairments:  Postural dysfunction, Decreased strength, Impaired flexibility, Pain, Increased muscle spasms, Impaired tone, Decreased range of motion, Decreased coordination  Visit Diagnosis: Muscle weakness (generalized)  Cramp and spasm     Problem List Patient Active Problem List   Diagnosis Date Noted  . Port-A-Cath in place 03/27/2019  . Cancer of rectum ([place]) 02/19/2019  . Malignant neoplasm of rectum ([place]) 02/11/2019  . ANXIETY 03/18/2007  . DEPRESSION 03/18/2007  . ALLERGIC RHINITIS 03/18/2007    Jule Ser, PT 10/09/2019, 4:17 PM  [facility] [address] [place], [place], [address] Phone: [phone]   Fax:  [phone]  Name: PRATHAM CASSATT III MRN: [id number] Date of Birth: July 08, 1975 PHYSICAL THERAPY DISCHARGE SUMMARY  Visits from Start of Care: 1  Current functional level related to goals / functional outcomes: See above eval only   Remaining deficits: See above   Education / Equipment: HEP  Plan: Patient agrees to discharge.  Patient goals were not met. Patient is being discharged due to a change in medical status.  ?????    [employer], PT 11/17/19 4:41 PM

## 2019-10-08 NOTE — Patient Instructions (Addendum)
DUE TO COVID-19 ONLY ONE VISITOR IS ALLOWED TO COME WITH YOU AND STAY IN THE WAITING ROOM ONLY DURING PRE OP AND PROCEDURE DAY OF SURGERY. THE 2 VISITORS MAY VISIT WITH YOU AFTER SURGERY IN YOUR PRIVATE ROOM DURING VISITING HOURS ONLY!  YOU NEED TO HAVE A COVID 19 TEST ON_5/1/21______ @_10 :00______, THIS TEST MUST BE DONE BEFORE SURGERY, COME  [place] , [id number].  ([place]) ONCE YOUR COVID TEST IS COMPLETED, PLEASE BEGIN THE QUARANTINE INSTRUCTIONS AS OUTLINED IN YOUR HANDOUT.                Brandon Barry    Your procedure is scheduled on: 10/15/19   Report to [facility] Main  Entrance   Report to admitting at   11:30 AM     Call this number if you have problems the morning of surgery [phone]   Follow instructions for bowel prep and drink plenty of liquid the day of prep to prevent dehydration.  At 10:00 PM the night before surgery drink 2 pre surgical ensure drinks.    BRUSH YOUR TEETH MORNING OF SURGERY AND RINSE YOUR MOUTH OUT, NO CHEWING GUM CANDY OR MINTS.  Do not eat food After Midnight.   YOU MAY HAVE CLEAR LIQUIDS FROM MIDNIGHT UNTIL 10:30 AM.     CLEAR LIQUID DIET   Foods Allowed                                                                     Foods Excluded  Coffee and tea, regular and decaf                             liquids that you cannot  Plain Jell-O any favor except red or purple                                           see through such as: Fruit ices (not with fruit pulp)                                     milk, soups, orange juice  Iced Popsicles                                    All solid food Carbonated beverages, regular and diet                                    Cranberry, grape and apple juices Sports drinks like Gatorade Lightly seasoned clear broth or consume(fat free) Sugar, honey syrup     At10:30 AM Please finish the prescribed Pre-Surgery  drink  . Nothing by mouth after you  finish the drink !    Take these medicines the morning of surgery with A SIP OF WATER: Pain med if needed.  Bring your mask and tubing to  the hospital with you.                                 You may not have any metal on your body including             piercings  Do not wear jewelry,  lotions, powders or  deodorant                       Men may shave face and neck.   Do not bring valuables to the hospital. [place].  Contacts, dentures or bridgework may not be worn into surgery.        Name and phone number of your driver:  Special Instructions: N/A              Please read over the following fact sheets you were given: _____________________________________________________________________             [facility] - Preparing for Surgery  Before surgery, you can play an important role.   Because skin is not sterile, your skin needs to be as free of germs as possible.   You can reduce the number of germs on your skin by washing with CHG (chlorahexidine gluconate) soap before surgery.   CHG is an antiseptic cleaner which kills germs and bonds with the skin to continue killing germs even after washing. Please DO NOT use if you have an allergy to CHG or antibacterial soaps .  If your skin becomes reddened/irritated stop using the CHG and inform your nurse when you arrive at Short Stay.  You may shave your face/neck.  Please follow these instructions carefully:  1.  Shower with CHG Soap the night before surgery and the  morning of Surgery.  2.  If you choose to wash your hair, wash your hair first as usual with your  normal  shampoo.  3.  After you shampoo, rinse your hair and body thoroughly to remove the  shampoo.                                        4.  Use CHG as you would any other liquid soap.  You can apply chg directly  to the skin and wash                       Gently with a scrungie or clean washcloth.  5.  Apply the  CHG Soap to your body ONLY FROM THE NECK DOWN.   Do not use on face/ open                           Wound or open sores. Avoid contact with eyes, ears mouth and genitals (private parts).                       Wash face,  Genitals (private parts) with your normal soap.             6.  Wash thoroughly, paying special attention to the area where your surgery  will be performed.  7.  Thoroughly rinse your body with warm water from the  neck down.  8.  DO NOT shower/wash with your normal soap after using and rinsing off  the CHG Soap.             9.  Pat yourself dry with a clean towel.            10.  Wear clean pajamas.            11.  Place clean sheets on your bed the night of your first shower and do not  sleep with pets. Day of Surgery : Do not apply any lotions/deodorants the morning of surgery.  Please wear clean clothes to the hospital/surgery center.  FAILURE TO FOLLOW THESE INSTRUCTIONS MAY RESULT IN THE CANCELLATION OF YOUR SURGERY PATIENT SIGNATURE_________________________________  NURSE SIGNATURE__________________________________  ________________________________________________________________________   Brandon Barry  An incentive spirometer is a tool that can help keep your lungs clear and active. This tool measures how well you are filling your lungs with each breath. Taking long deep breaths may help reverse or decrease the chance of developing breathing (pulmonary) problems (especially infection) following:  A long period of time when you are unable to move or be active. BEFORE THE PROCEDURE   If the spirometer includes an indicator to show your best effort, your nurse or respiratory therapist will set it to a desired goal.  If possible, sit up straight or lean slightly forward. Try not to slouch.  Hold the incentive spirometer in an upright position. INSTRUCTIONS FOR USE  1. Sit on the edge of your bed if possible, or sit up as far as you can in bed or on a  chair. 2. Hold the incentive spirometer in an upright position. 3. Breathe out normally. 4. Place the mouthpiece in your mouth and seal your lips tightly around it. 5. Breathe in slowly and as deeply as possible, raising the piston or the ball toward the top of the column. 6. Hold your breath for 3-5 seconds or for as long as possible. Allow the piston or ball to fall to the bottom of the column. 7. Remove the mouthpiece from your mouth and breathe out normally. 8. Rest for a few seconds and repeat Steps 1 through 7 at least 10 times every 1-2 hours when you are awake. Take your time and take a few normal breaths between deep breaths. 9. The spirometer may include an indicator to show your best effort. Use the indicator as a goal to work toward during each repetition. 10. After each set of 10 deep breaths, practice coughing to be sure your lungs are clear. If you have an incision (the cut made at the time of surgery), support your incision when coughing by placing a pillow or rolled up towels firmly against it. Once you are able to get out of bed, walk around indoors and cough well. You may stop using the incentive spirometer when instructed by your caregiver.  RISKS AND COMPLICATIONS  Take your time so you do not get dizzy or light-headed.  If you are in pain, you may need to take or ask for pain medication before doing incentive spirometry. It is harder to take a deep breath if you are having pain. AFTER USE  Rest and breathe slowly and easily.  It can be helpful to keep track of a log of your progress. Your caregiver can provide you with a simple table to help with this. If you are using the spirometer at home, follow these instructions: [place] IF:   You  are having difficultly using the spirometer.  You have trouble using the spirometer as often as instructed.  Your pain medication is not giving enough relief while using the spirometer.  You develop fever of 100.5 F  (38.1 C) or higher. SEEK IMMEDIATE MEDICAL CARE IF:   You cough up bloody sputum that had not been present before.  You develop fever of 102 F (38.9 C) or greater.  You develop worsening pain at or near the incision site. MAKE SURE YOU:   Understand these instructions.  Will watch your condition.  Will get help right away if you are not doing well or get worse. Document Released: 10/09/2006 Document Revised: 08/21/2011 Document Reviewed: 12/10/2006 [facility] Patient Information 2014 [place], [place].   ________________________________________________________________________

## 2019-10-08 NOTE — Patient Instructions (Addendum)
Toileting Techniques for Bowel Movements    An Evacuation/Defecation Plan   Here are the 4 basic points:  1. Lean forward enough for your elbows to rest on your knees 2. Support your feet on the floor or use a low stool if your feet don't touch the floor  3. Push out your belly as if you have swallowed a beach ball--you should feel a widening of your waist. "Belly Big, Belly Hard" 4. Open and relax your pelvic floor muscles, rather than tightening around the anus  While you are sitting on the toilet pay attention to the following areas: . Jaw and mouth position- relaxed not clenched . Angle of your hips - leaning slightly forward . Whether your feet touch the ground or not - should be flat and supported . Arm placement - rest against your thighs . Spine position - flat back . Waist . Breathing - exhale as you push (like blowing up a balloon or try using other sounds such as ahhhh, shhhhh, ohhhh or grrrrrrr) . Belly - hard and tight as you push . Anus (opening of the anal canal) - relaxed and open as you push . Anus - Tighten and lift pulling the muscle back in after you are done or if taking a break  If you are not successful after 10-15 minutes, try again later.  Avoid negative self-talk about your toileting experience.   Read this for more details and ask your PT if you need suggestions for adjustments or limitations:  1) Sitting on the toilet  a) Make sure your feet are supported - flat on the floor or step stool b) Many people find it effective to lean forward or raise their knees.  Propping your feet on a step stool (squatty potty is a brand name) can help the muscles around the anus to relax  c) When you lean forward, place your forearms on your thighs for support  2) Relaxing a) Breathe deeply and slowly in through your nose and out through your mouth. b) To become aware of how to relax your muscles, contracting and releasing muscles can be helpful.  Pull your pelvic floor  muscles in tightly by using the image of holding back gas, or closing around the anus (visualize making a circle smaller) and lifting the anus up and in.  Then release the muscles and your anus should drop down and feel open. Repeat 5 times ending with the feeling of relaxation. c) Keep your pelvic floor muscles relaxed; let your belly bulge out. d) The digestive tract starts at the mouth and ends at the anal opening, so be sure to relax both ends of the tube.  Place your tongue on the roof of your mouth with your teeth separated.  This helps relax your mouth and will help to relax the anus at the same time.  3) Emptying (defecation) a) Keep your pelvic floor and sphincter relaxed, then bulge your anal muscles.  Make the anal opening wide.  b) Stick your belly out as if you have swallowed a beach ball. c) Make your belly wall hard using your belly muscles while continuing to breathe. Doing this makes it easier to open your anus. d) Breath out and give a grunt (or try using other sounds such as ahhhh, shhhhh, ohhhh or grrrrrrr). e)  Can also try to act as if you are blowing up a balloon as you push  4) Finishing a) As you finish your bowel movement, pull the pelvic floor muscles   up and in.  This will leave your anus in the proper place rather than remaining pushed out and down. If you leave your anus pushed out and down, it will start to feel as though that is normal and give you incorrect signals about needing to have a bowel movement. Access Code: [email or web address] URL: [email or web address] Date: 10/08/2019 Prepared by: Jari Favre  Exercises Supine Butterfly Groin Stretch - 1 x daily - 7 x weekly - 1 reps - 1 sets - 1 min hold Seated Hamstring Stretch - 1 x daily - 7 x weekly - 2 reps - 1 sets - 30 sec hold Supine Hamstring Stretch - 1 x daily - 7 x weekly - 2 reps - 1 sets - sec hold Supine Figure 4 Piriformis Stretch - 1 x daily - 7 x weekly - 2 reps - 1 sets - 30 sec  hold Seated Piriformis Stretch with Trunk Bend - 1 x daily - 7 x weekly - 2 reps - 1 sets - 30 sec hold Supine Piriformis Stretch with Foot on Ground - 1 x daily - 7 x weekly - 2 reps - 1 sets - 30 sec hold Supine Quadriceps Stretch with Strap on Table - 1 x daily - 7 x weekly - 2 reps - 1 sets - 30 sec hold Supine Pelvic Floor Stretch - Hands on Knees - 1 x daily - 7 x weekly - 1 sets - 3 reps - 30 sec hold

## 2019-10-09 ENCOUNTER — Other Ambulatory Visit: Payer: Self-pay

## 2019-10-09 ENCOUNTER — Encounter (HOSPITAL_COMMUNITY): Payer: Self-pay

## 2019-10-09 ENCOUNTER — Encounter (HOSPITAL_COMMUNITY)
Admission: RE | Admit: 2019-10-09 | Discharge: 2019-10-09 | Disposition: A | Discharge status: Home / Self Care | Payer: Commercial Managed Care - PPO | Source: Ambulatory Visit | Attending: General Surgery | Admitting: General Surgery

## 2019-10-09 DIAGNOSIS — Z01812 Encounter for preprocedural laboratory examination: Secondary | ICD-10-CM | POA: Insufficient documentation

## 2019-10-09 HISTORY — DX: Polyneuropathy, unspecified: G62.9

## 2019-10-09 LAB — BASIC METABOLIC PANEL
Anion gap: 7 (ref 5–15)
BUN: 12 mg/dL (ref 6–20)
CO2: 27 mmol/L (ref 22–32)
Calcium: 9.1 mg/dL (ref 8.9–10.3)
Chloride: 106 mmol/L (ref 98–111)
Creatinine, Ser: 0.87 mg/dL (ref 0.61–1.24)
GFR calc Af Amer: >60 mL/min (ref >60)
GFR calc non Af Amer: >60 mL/min (ref >60)
Glucose, Bld: 97 mg/dL (ref 70–99)
Potassium: 3.9 mmol/L (ref 3.5–5.1)
Sodium: 140 mmol/L (ref 135–145)

## 2019-10-09 LAB — CBC
HCT: 40.1 % (ref 39.0–52.0)
Hemoglobin: 13.5 g/dL (ref 13.0–17.0)
MCH: 31.5 pg (ref 26.0–34.0)
MCHC: 33.7 g/dL (ref 30.0–36.0)
MCV: 93.5 fL (ref 80.0–100.0)
Platelets: 244 10*3/uL (ref 150–400)
RBC: 4.29 MIL/uL (ref 4.22–5.81)
RDW: 13.2 % (ref 11.5–15.5)
WBC: 5 10*3/uL (ref 4.0–10.5)
nRBC: 0 % (ref 0.0–0.2)

## 2019-10-09 NOTE — Addendum Note (Signed)
Addended by: Su Hoff on: 10/09/2019 04:19 PM   Modules accepted: Orders

## 2019-10-09 NOTE — Progress Notes (Signed)
PCP - [facility] Cardiologist - none  Chest x-ray - 02/21/19 EKG - no Stress Test -no ECHO - no Cardiac Cath - no  Sleep Study - yes CPAP - yes  Fasting Blood Sugar -NA  Checks Blood Sugar _____ times a day  Blood Thinner Instructions:na Aspirin Instructions: Last Dose:  Anesthesia review:   Patient denies shortness of breath, fever, cough and chest pain at PAT appointment Yes  Patient verbalized understanding of instructions that were given to them at the PAT appointment. Patient was also instructed that they will need to review over the PAT instructions again at home before surgery. Yes

## 2019-10-11 ENCOUNTER — Other Ambulatory Visit (HOSPITAL_COMMUNITY)
Admission: RE | Admit: 2019-10-11 | Discharge: 2019-10-11 | Disposition: A | Discharge status: Home / Self Care | Payer: Commercial Managed Care - PPO | Source: Ambulatory Visit | Attending: General Surgery | Admitting: General Surgery

## 2019-10-11 DIAGNOSIS — Z20822 Contact with and (suspected) exposure to covid-19: Secondary | ICD-10-CM | POA: Insufficient documentation

## 2019-10-11 DIAGNOSIS — Z01812 Encounter for preprocedural laboratory examination: Secondary | ICD-10-CM | POA: Insufficient documentation

## 2019-10-11 LAB — SARS CORONAVIRUS 2 (TAT 6-24 HRS): SARS Coronavirus 2: NEGATIVE

## 2019-10-14 MED ORDER — BUPIVACAINE LIPOSOME 1.3 % IJ SUSP
20.0000 mL | Freq: Once | INTRAMUSCULAR | Status: DC
  Filled 2019-10-14: qty 20

## 2019-10-14 NOTE — Progress Notes (Signed)
  Radiation Oncology         [phone] ________________________________  Name: Brandon Barry MRN: [id number]  Date: 08/06/2019  DOB: January 06, 1976  End of Treatment Note  Diagnosis:   Rectal cancer     Indication for treatment:  Curative       Radiation treatment dates:   06/30/2019 through 08/06/2019  Site/dose:    The patient was treated to the pelvis to a dose of 45 Gy at 1.8 Gy per fraction. This was accomplished using a 4 field 3-D conformal technique. The patient then received a boost to the tumor and adjacent high-risk regions for an additional 5.4 Gy at 1.8 gray per fraction. This was carried out using a coned-down 4 field approach. The patient's total dose was 50.4 Gy. Daily AlignRT was used on a daily basis to insure proper patient positioning and localization of critical targets/ structures. The patient received concurrent chemotherapy during the course of radiation treatment.  Narrative: The patient tolerated radiation treatment relatively well.     Plan: The patient has completed radiation treatment. The patient will return to radiation oncology clinic for routine followup in one month. I advised the patient to call or return sooner if they have any questions or concerns related to their recovery or treatment.   ------------------------------------------------  Jodelle Gross, MD, PhD

## 2019-10-15 ENCOUNTER — Inpatient Hospital Stay (HOSPITAL_COMMUNITY)
Admission: RE | Admit: 2019-10-15 | Discharge: 2019-10-21 | DRG: 331 | Disposition: A | Discharge status: Home / Self Care | Payer: Commercial Managed Care - PPO | Source: Ambulatory Visit | Attending: General Surgery | Admitting: General Surgery

## 2019-10-15 ENCOUNTER — Inpatient Hospital Stay (HOSPITAL_COMMUNITY): Payer: Commercial Managed Care - PPO | Admitting: Certified Registered Nurse Anesthetist

## 2019-10-15 ENCOUNTER — Encounter (HOSPITAL_COMMUNITY)
Admission: RE | Disposition: A | Discharge status: Home / Self Care | Payer: Self-pay | Source: Ambulatory Visit | Attending: General Surgery

## 2019-10-15 ENCOUNTER — Encounter (HOSPITAL_COMMUNITY): Payer: Self-pay | Admitting: General Surgery

## 2019-10-15 ENCOUNTER — Other Ambulatory Visit: Payer: Self-pay

## 2019-10-15 DIAGNOSIS — C20 Malignant neoplasm of rectum: Principal | ICD-10-CM | POA: Diagnosis present

## 2019-10-15 DIAGNOSIS — E785 Hyperlipidemia, unspecified: Secondary | ICD-10-CM | POA: Diagnosis present

## 2019-10-15 DIAGNOSIS — G8918 Other acute postprocedural pain: Secondary | ICD-10-CM | POA: Diagnosis not present

## 2019-10-15 DIAGNOSIS — K624 Stenosis of anus and rectum: Secondary | ICD-10-CM | POA: Diagnosis present

## 2019-10-15 DIAGNOSIS — K660 Peritoneal adhesions (postprocedural) (postinfection): Secondary | ICD-10-CM | POA: Diagnosis present

## 2019-10-15 HISTORY — PX: XI ROBOTIC ASSISTED LOWER ANTERIOR RESECTION: SHX6558

## 2019-10-15 HISTORY — PX: PORT-A-CATH REMOVAL: SHX5289

## 2019-10-15 SURGERY — RESECTION, RECTUM, LOW ANTERIOR, ROBOT-ASSISTED
Anesthesia: General | Site: Abdomen

## 2019-10-15 MED ORDER — ESMOLOL HCL 100 MG/10ML IV SOLN
INTRAVENOUS | Status: DC | PRN
  Administered 2019-10-15 (×2): 30 mg via INTRAVENOUS

## 2019-10-15 MED ORDER — LORAZEPAM 0.5 MG PO TABS
0.5000 mg | ORAL_TABLET | Freq: Three times a day (TID) | ORAL | Status: DC | PRN
  Administered 2019-10-15 – 2019-10-18 (×3): 0.5 mg via ORAL
  Filled 2019-10-15 (×3): qty 1

## 2019-10-15 MED ORDER — DEXAMETHASONE SODIUM PHOSPHATE 10 MG/ML IJ SOLN
INTRAMUSCULAR | Status: DC | PRN
  Administered 2019-10-15: 8 mg via INTRAVENOUS

## 2019-10-15 MED ORDER — EPHEDRINE SULFATE-NACL 50-0.9 MG/10ML-% IV SOSY
PREFILLED_SYRINGE | INTRAVENOUS | Status: DC | PRN
  Administered 2019-10-15: 5 mg via INTRAVENOUS
  Administered 2019-10-15: 10 mg via INTRAVENOUS

## 2019-10-15 MED ORDER — INDOCYANINE GREEN 25 MG IV SOLR
INTRAVENOUS | Status: DC | PRN
Start: 2019-10-15 — End: 2019-10-15
  Administered 2019-10-15: 7.5 mg via INTRAVENOUS

## 2019-10-15 MED ORDER — PROMETHAZINE HCL 25 MG/ML IJ SOLN: 6.2500 mg | INTRAMUSCULAR | Status: DC | PRN

## 2019-10-15 MED ORDER — LACTATED RINGERS IV SOLN: INTRAVENOUS | Status: DC

## 2019-10-15 MED ORDER — SUGAMMADEX SODIUM 200 MG/2ML IV SOLN
INTRAVENOUS | Status: DC | PRN
  Administered 2019-10-15: 200 mg via INTRAVENOUS

## 2019-10-15 MED ORDER — HEPARIN SODIUM (PORCINE) 5000 UNIT/ML IJ SOLN
INTRAMUSCULAR | Status: AC
  Administered 2019-10-15: 5000 [IU] via SUBCUTANEOUS
  Filled 2019-10-15: qty 1

## 2019-10-15 MED ORDER — LIDOCAINE 2% (20 MG/ML) 5 ML SYRINGE
INTRAMUSCULAR | Status: DC | PRN
  Administered 2019-10-15: 1.5 mg/kg/h via INTRAVENOUS

## 2019-10-15 MED ORDER — GABAPENTIN 300 MG PO CAPS: 300.0000 mg | ORAL_CAPSULE | ORAL | Status: AC

## 2019-10-15 MED ORDER — LACTATED RINGERS IV SOLN: INTRAVENOUS | Status: DC | PRN

## 2019-10-15 MED ORDER — KETAMINE HCL 10 MG/ML IJ SOLN
INTRAMUSCULAR | Status: DC | PRN
  Administered 2019-10-15: 40 mg via INTRAVENOUS

## 2019-10-15 MED ORDER — ACETAMINOPHEN 500 MG PO TABS: 1000.0000 mg | ORAL_TABLET | ORAL | Status: AC

## 2019-10-15 MED ORDER — HYDROMORPHONE HCL 1 MG/ML IJ SOLN
0.5000 mg | INTRAMUSCULAR | Status: DC | PRN
  Administered 2019-10-15 – 2019-10-16 (×6): 0.5 mg via INTRAVENOUS
  Filled 2019-10-15 (×6): qty 0.5

## 2019-10-15 MED ORDER — SODIUM CHLORIDE 0.9 % IV SOLN
2.0000 g | INTRAVENOUS | Status: AC
  Administered 2019-10-15: 2 g via INTRAVENOUS

## 2019-10-15 MED ORDER — GABAPENTIN 300 MG PO CAPS
ORAL_CAPSULE | ORAL | Status: AC
  Administered 2019-10-15: 300 mg via ORAL
  Filled 2019-10-15: qty 1

## 2019-10-15 MED ORDER — ONDANSETRON HCL 4 MG PO TABS: 4.0000 mg | ORAL_TABLET | Freq: Four times a day (QID) | ORAL | Status: DC | PRN

## 2019-10-15 MED ORDER — ROCURONIUM BROMIDE 10 MG/ML (PF) SYRINGE
PREFILLED_SYRINGE | INTRAVENOUS | Status: AC
  Filled 2019-10-15: qty 10

## 2019-10-15 MED ORDER — ONDANSETRON HCL 4 MG/2ML IJ SOLN
INTRAMUSCULAR | Status: DC | PRN
  Administered 2019-10-15: 4 mg via INTRAVENOUS

## 2019-10-15 MED ORDER — ONDANSETRON HCL 4 MG/2ML IJ SOLN
4.0000 mg | Freq: Four times a day (QID) | INTRAMUSCULAR | Status: DC | PRN
  Administered 2019-10-19 – 2019-10-21 (×5): 4 mg via INTRAVENOUS
  Filled 2019-10-15 (×4): qty 2

## 2019-10-15 MED ORDER — DEXMEDETOMIDINE HCL 200 MCG/2ML IV SOLN
INTRAVENOUS | Status: DC | PRN
  Administered 2019-10-15 (×2): 4 ug via INTRAVENOUS

## 2019-10-15 MED ORDER — DIPHENHYDRAMINE HCL 50 MG/ML IJ SOLN: 25.0000 mg | Freq: Four times a day (QID) | INTRAMUSCULAR | Status: DC | PRN

## 2019-10-15 MED ORDER — LIDOCAINE 2% (20 MG/ML) 5 ML SYRINGE
INTRAMUSCULAR | Status: DC | PRN
  Administered 2019-10-15: 100 mg via INTRAVENOUS

## 2019-10-15 MED ORDER — BUPIVACAINE LIPOSOME 1.3 % IJ SUSP
INTRAMUSCULAR | Status: DC | PRN
  Administered 2019-10-15: 20 mL

## 2019-10-15 MED ORDER — MEPERIDINE HCL 50 MG/ML IJ SOLN: 6.2500 mg | INTRAMUSCULAR | Status: DC | PRN

## 2019-10-15 MED ORDER — DIPHENHYDRAMINE HCL 25 MG PO CAPS: 25.0000 mg | ORAL_CAPSULE | Freq: Four times a day (QID) | ORAL | Status: DC | PRN

## 2019-10-15 MED ORDER — SODIUM CHLORIDE 0.9 % IV SOLN
INTRAVENOUS | Status: AC
  Filled 2019-10-15: qty 2

## 2019-10-15 MED ORDER — ALVIMOPAN 12 MG PO CAPS
ORAL_CAPSULE | ORAL | Status: AC
  Administered 2019-10-15: 12 mg via ORAL
  Filled 2019-10-15: qty 1

## 2019-10-15 MED ORDER — LIDOCAINE 2% (20 MG/ML) 5 ML SYRINGE
INTRAMUSCULAR | Status: AC
  Filled 2019-10-15: qty 5

## 2019-10-15 MED ORDER — ESMOLOL HCL 100 MG/10ML IV SOLN
INTRAVENOUS | Status: AC
  Filled 2019-10-15: qty 10

## 2019-10-15 MED ORDER — GLYCOPYRROLATE PF 0.2 MG/ML IJ SOSY
PREFILLED_SYRINGE | INTRAMUSCULAR | Status: DC | PRN
  Administered 2019-10-15: .2 mg via INTRAVENOUS

## 2019-10-15 MED ORDER — BUPIVACAINE-EPINEPHRINE (PF) 0.5% -1:200000 IJ SOLN
INTRAMUSCULAR | Status: DC | PRN
  Administered 2019-10-15: 30 mL

## 2019-10-15 MED ORDER — KETOROLAC TROMETHAMINE 30 MG/ML IJ SOLN: 30.0000 mg | Freq: Once | INTRAMUSCULAR | Status: AC | PRN

## 2019-10-15 MED ORDER — DEXMEDETOMIDINE HCL IN NACL 200 MCG/50ML IV SOLN
INTRAVENOUS | Status: AC
  Filled 2019-10-15: qty 50

## 2019-10-15 MED ORDER — PROPOFOL 10 MG/ML IV BOLUS
INTRAVENOUS | Status: AC
  Filled 2019-10-15: qty 20

## 2019-10-15 MED ORDER — DEXAMETHASONE SODIUM PHOSPHATE 10 MG/ML IJ SOLN
INTRAMUSCULAR | Status: AC
  Filled 2019-10-15: qty 1

## 2019-10-15 MED ORDER — KETAMINE HCL 10 MG/ML IJ SOLN
INTRAMUSCULAR | Status: AC
  Filled 2019-10-15: qty 1

## 2019-10-15 MED ORDER — BUPIVACAINE-EPINEPHRINE 0.5% -1:200000 IJ SOLN
INTRAMUSCULAR | Status: AC
  Filled 2019-10-15: qty 1

## 2019-10-15 MED ORDER — FENTANYL CITRATE (PF) 100 MCG/2ML IJ SOLN
INTRAMUSCULAR | Status: AC
  Filled 2019-10-15: qty 2

## 2019-10-15 MED ORDER — HYDRALAZINE HCL 20 MG/ML IJ SOLN: 10.0000 mg | Freq: Once | INTRAMUSCULAR | Status: AC

## 2019-10-15 MED ORDER — FENTANYL CITRATE (PF) 250 MCG/5ML IJ SOLN
INTRAMUSCULAR | Status: DC | PRN
  Administered 2019-10-15 (×5): 50 ug via INTRAVENOUS
  Administered 2019-10-15: 100 ug via INTRAVENOUS

## 2019-10-15 MED ORDER — KETOROLAC TROMETHAMINE 30 MG/ML IJ SOLN
INTRAMUSCULAR | Status: AC
  Administered 2019-10-15: 30 mg via INTRAVENOUS
  Filled 2019-10-15: qty 1

## 2019-10-15 MED ORDER — FENTANYL CITRATE (PF) 100 MCG/2ML IJ SOLN
INTRAMUSCULAR | Status: AC
  Administered 2019-10-15: 50 ug via INTRAVENOUS
  Filled 2019-10-15: qty 4

## 2019-10-15 MED ORDER — KCL IN DEXTROSE-NACL 20-5-0.45 MEQ/L-%-% IV SOLN
INTRAVENOUS | Status: DC
  Filled 2019-10-15 (×2): qty 1000

## 2019-10-15 MED ORDER — TRAMADOL HCL 50 MG PO TABS
50.0000 mg | ORAL_TABLET | Freq: Four times a day (QID) | ORAL | Status: DC | PRN
  Administered 2019-10-15 – 2019-10-16 (×2): 50 mg via ORAL
  Filled 2019-10-15 (×3): qty 1

## 2019-10-15 MED ORDER — SACCHAROMYCES BOULARDII 250 MG PO CAPS
250.0000 mg | ORAL_CAPSULE | Freq: Two times a day (BID) | ORAL | Status: DC
  Administered 2019-10-15 – 2019-10-21 (×12): 250 mg via ORAL
  Filled 2019-10-15 (×12): qty 1

## 2019-10-15 MED ORDER — ACETAMINOPHEN 500 MG PO TABS
1000.0000 mg | ORAL_TABLET | Freq: Four times a day (QID) | ORAL | Status: DC
  Administered 2019-10-15 – 2019-10-16 (×3): 1000 mg via ORAL
  Filled 2019-10-15 (×3): qty 2

## 2019-10-15 MED ORDER — ROCURONIUM BROMIDE 10 MG/ML (PF) SYRINGE
PREFILLED_SYRINGE | INTRAVENOUS | Status: DC | PRN
  Administered 2019-10-15: 10 mg via INTRAVENOUS
  Administered 2019-10-15: 20 mg via INTRAVENOUS
  Administered 2019-10-15: 60 mg via INTRAVENOUS
  Administered 2019-10-15: 10 mg via INTRAVENOUS

## 2019-10-15 MED ORDER — ENSURE SURGERY PO LIQD
237.0000 mL | Freq: Two times a day (BID) | ORAL | Status: DC
  Administered 2019-10-16 – 2019-10-21 (×4): 237 mL via ORAL
  Filled 2019-10-15 (×12): qty 237

## 2019-10-15 MED ORDER — HEPARIN SODIUM (PORCINE) 5000 UNIT/ML IJ SOLN: 5000.0000 [IU] | Freq: Once | INTRAMUSCULAR | Status: AC

## 2019-10-15 MED ORDER — ONDANSETRON HCL 4 MG/2ML IJ SOLN
INTRAMUSCULAR | Status: AC
  Filled 2019-10-15: qty 2

## 2019-10-15 MED ORDER — FENTANYL CITRATE (PF) 250 MCG/5ML IJ SOLN
INTRAMUSCULAR | Status: AC
  Filled 2019-10-15: qty 5

## 2019-10-15 MED ORDER — MIDAZOLAM HCL 5 MG/5ML IJ SOLN
INTRAMUSCULAR | Status: DC | PRN
  Administered 2019-10-15: 2 mg via INTRAVENOUS

## 2019-10-15 MED ORDER — PROPOFOL 10 MG/ML IV BOLUS
INTRAVENOUS | Status: DC | PRN
  Administered 2019-10-15: 200 mg via INTRAVENOUS

## 2019-10-15 MED ORDER — MIDAZOLAM HCL 2 MG/2ML IJ SOLN
INTRAMUSCULAR | Status: AC
  Filled 2019-10-15: qty 2

## 2019-10-15 MED ORDER — LACTATED RINGERS IR SOLN
Status: DC | PRN
  Administered 2019-10-15: 1000 mL

## 2019-10-15 MED ORDER — ENOXAPARIN SODIUM 40 MG/0.4ML ~~LOC~~ SOLN
40.0000 mg | SUBCUTANEOUS | Status: DC
  Administered 2019-10-16 – 2019-10-21 (×6): 40 mg via SUBCUTANEOUS
  Filled 2019-10-15 (×6): qty 0.4

## 2019-10-15 MED ORDER — ACETAMINOPHEN 500 MG PO TABS
ORAL_TABLET | ORAL | Status: AC
  Administered 2019-10-15: 1000 mg via ORAL
  Filled 2019-10-15: qty 2

## 2019-10-15 MED ORDER — FENTANYL CITRATE (PF) 100 MCG/2ML IJ SOLN
25.0000 ug | INTRAMUSCULAR | Status: DC | PRN
  Administered 2019-10-15 (×2): 50 ug via INTRAVENOUS

## 2019-10-15 MED ORDER — ALVIMOPAN 12 MG PO CAPS: 12.0000 mg | ORAL_CAPSULE | ORAL | Status: AC

## 2019-10-15 MED ORDER — HYDRALAZINE HCL 20 MG/ML IJ SOLN
INTRAMUSCULAR | Status: AC
  Administered 2019-10-15: 10 mg via INTRAVENOUS
  Filled 2019-10-15: qty 1

## 2019-10-15 MED ORDER — EPHEDRINE 5 MG/ML INJ
INTRAVENOUS | Status: AC
  Filled 2019-10-15: qty 10

## 2019-10-15 MED ORDER — ALUM & MAG HYDROXIDE-SIMETH 200-200-20 MG/5ML PO SUSP: 30.0000 mL | Freq: Four times a day (QID) | ORAL | Status: DC | PRN

## 2019-10-15 MED ORDER — GLYCOPYRROLATE PF 0.2 MG/ML IJ SOSY
PREFILLED_SYRINGE | INTRAMUSCULAR | Status: AC
  Filled 2019-10-15: qty 1

## 2019-10-15 MED ORDER — ALVIMOPAN 12 MG PO CAPS: 12.0000 mg | ORAL_CAPSULE | Freq: Two times a day (BID) | ORAL | Status: DC

## 2019-10-15 MED ORDER — GABAPENTIN 300 MG PO CAPS
600.0000 mg | ORAL_CAPSULE | Freq: Two times a day (BID) | ORAL | Status: DC
  Administered 2019-10-15 – 2019-10-21 (×12): 600 mg via ORAL
  Filled 2019-10-15 (×12): qty 2

## 2019-10-15 MED ORDER — 0.9 % SODIUM CHLORIDE (POUR BTL) OPTIME
TOPICAL | Status: DC | PRN
  Administered 2019-10-15: 2000 mL

## 2019-10-15 MED ORDER — SODIUM CHLORIDE 0.9 % IV SOLN
2.0000 g | Freq: Two times a day (BID) | INTRAVENOUS | Status: AC
  Administered 2019-10-16: 2 g via INTRAVENOUS
  Filled 2019-10-15: qty 2

## 2019-10-15 SURGICAL SUPPLY — 107 items
BLADE EXTENDED COATED 6.5IN (ELECTRODE) IMPLANT
BLADE SURG 15 STRL LF DISP TIS (BLADE) ×2 IMPLANT
BLADE SURG 15 STRL SS (BLADE) ×3
CANNULA REDUC XI 12-8 STAPL (CANNULA)
CANNULA REDUCER 12-8 DVNC XI (CANNULA) IMPLANT
CELLS DAT CNTRL 66122 CELL SVR (MISCELLANEOUS) IMPLANT
COVER SURGICAL LIGHT HANDLE (MISCELLANEOUS) ×6 IMPLANT
COVER TIP SHEARS 8 DVNC (MISCELLANEOUS) ×2 IMPLANT
COVER TIP SHEARS 8MM DA VINCI (MISCELLANEOUS) ×3
COVER WAND RF STERILE (DRAPES) ×3 IMPLANT
DECANTER SPIKE VIAL GLASS SM (MISCELLANEOUS) ×3 IMPLANT
DERMABOND ADVANCED (GAUZE/BANDAGES/DRESSINGS) ×2
DERMABOND ADVANCED .7 DNX12 (GAUZE/BANDAGES/DRESSINGS) ×2 IMPLANT
DRAIN CHANNEL 19F RND (DRAIN) ×1 IMPLANT
DRAPE ARM DVNC X/XI (DISPOSABLE) ×8 IMPLANT
DRAPE COLUMN DVNC XI (DISPOSABLE) ×2 IMPLANT
DRAPE CV SPLIT W-CLR ANES SCRN (DRAPES) ×1 IMPLANT
DRAPE DA VINCI XI ARM (DISPOSABLE) ×12
DRAPE DA VINCI XI COLUMN (DISPOSABLE) ×3
DRAPE LAPAROTOMY TRNSV 102X78 (DRAPES) ×4 IMPLANT
DRAPE PERI GROIN 82X75IN TIB (DRAPES) ×1 IMPLANT
DRAPE SURG IRRIG POUCH 19X23 (DRAPES) ×3 IMPLANT
DRSG OPSITE POSTOP 4X10 (GAUZE/BANDAGES/DRESSINGS) IMPLANT
DRSG OPSITE POSTOP 4X6 (GAUZE/BANDAGES/DRESSINGS) ×1 IMPLANT
DRSG OPSITE POSTOP 4X8 (GAUZE/BANDAGES/DRESSINGS) IMPLANT
ELECT PENCIL ROCKER SW 15FT (MISCELLANEOUS) ×2 IMPLANT
ELECT REM PT RETURN 15FT ADLT (MISCELLANEOUS) ×3 IMPLANT
ENDOLOOP SUT PDS II  0 18 (SUTURE)
ENDOLOOP SUT PDS II 0 18 (SUTURE) IMPLANT
EVACUATOR SILICONE 100CC (DRAIN) ×1 IMPLANT
GAUZE 4X4 16PLY RFD (DISPOSABLE) ×3 IMPLANT
GAUZE SPONGE 4X4 12PLY STRL (GAUZE/BANDAGES/DRESSINGS) IMPLANT
GLOVE BIO SURGEON STRL SZ 6.5 (GLOVE) ×9 IMPLANT
GLOVE BIOGEL PI IND STRL 7.0 (GLOVE) ×4 IMPLANT
GLOVE BIOGEL PI INDICATOR 7.0 (GLOVE) ×7
GLOVE ECLIPSE 8.5 STRL (GLOVE) ×2 IMPLANT
GLOVE SURG ORTHO 8.0 STRL STRW (GLOVE) ×2 IMPLANT
GLOVE SURG SS PI 7.0 STRL IVOR (GLOVE) ×2 IMPLANT
GOWN STRL REUS W/TWL XL LVL3 (GOWN DISPOSABLE) ×13 IMPLANT
GRASPER SUT TROCAR 14GX15 (MISCELLANEOUS) IMPLANT
HOLDER FOLEY CATH W/STRAP (MISCELLANEOUS) ×3 IMPLANT
IRRIG SUCT STRYKERFLOW 2 WTIP (MISCELLANEOUS) ×3
IRRIGATION SUCT STRKRFLW 2 WTP (MISCELLANEOUS) ×2 IMPLANT
KIT BASIN (CUSTOM PROCEDURE TRAY) ×3 IMPLANT
KIT PROCEDURE DA VINCI SI (MISCELLANEOUS)
KIT PROCEDURE DVNC SI (MISCELLANEOUS) IMPLANT
KIT TURNOVER KIT A (KITS) IMPLANT
NDL HYPO 25X1 1.5 SAFETY (NEEDLE) ×2 IMPLANT
NDL INSUFFLATION 14GA 120MM (NEEDLE) ×2 IMPLANT
NEEDLE HYPO 25X1 1.5 SAFETY (NEEDLE) ×3 IMPLANT
NEEDLE INSUFFLATION 14GA 120MM (NEEDLE) ×3 IMPLANT
PACK BASIC VI WITH GOWN DISP (CUSTOM PROCEDURE TRAY) ×3 IMPLANT
PAD POSITIONING PINK XL (MISCELLANEOUS) ×3 IMPLANT
PENCIL SMOKE EVACUATOR (MISCELLANEOUS) IMPLANT
PORT LAP GEL ALEXIS MED 5-9CM (MISCELLANEOUS) ×1 IMPLANT
RELOAD STAPLE 60 4.3 GRN DVNC (STAPLE) IMPLANT
RELOAD STAPLER 4.3X60 GRN DVNC (STAPLE) ×4 IMPLANT
RETRACTOR WND ALEXIS 18 MED (MISCELLANEOUS) IMPLANT
RTRCTR WOUND ALEXIS 18CM MED (MISCELLANEOUS)
SCISSORS LAP 5X35 DISP (ENDOMECHANICALS) IMPLANT
SEAL CANN UNIV 5-8 DVNC XI (MISCELLANEOUS) ×6 IMPLANT
SEAL XI 5MM-8MM UNIVERSAL (MISCELLANEOUS) ×9
SEALER VESSEL DA VINCI XI (MISCELLANEOUS) ×3
SEALER VESSEL EXT DVNC XI (MISCELLANEOUS) ×2 IMPLANT
SOLUTION ELECTROLUBE (MISCELLANEOUS) ×3 IMPLANT
STAPLER 45 DA VINCI SURE FORM (STAPLE)
STAPLER 45 SUREFORM DVNC (STAPLE) IMPLANT
STAPLER 60 DA VINCI SURE FORM (STAPLE) ×3
STAPLER 60 SUREFORM DVNC (STAPLE) IMPLANT
STAPLER CANNULA SEAL DVNC XI (STAPLE) IMPLANT
STAPLER CANNULA SEAL XI (STAPLE)
STAPLER ECHELON POWER CIR 31 (STAPLE) ×1 IMPLANT
STAPLER RELOAD 4.3X60 GREEN (STAPLE) ×6
STAPLER RELOAD 4.3X60 GRN DVNC (STAPLE) ×4
STOPCOCK 4 WAY LG BORE MALE ST (IV SETS) ×6 IMPLANT
SUT ETHILON 2 0 PS N (SUTURE) ×1 IMPLANT
SUT NOVA NAB DX-16 0-1 5-0 T12 (SUTURE) ×7 IMPLANT
SUT PROLENE 2 0 KS (SUTURE) ×1 IMPLANT
SUT SILK 2 0 (SUTURE) ×3
SUT SILK 2 0 SH CR/8 (SUTURE) IMPLANT
SUT SILK 2-0 18XBRD TIE 12 (SUTURE) ×2 IMPLANT
SUT SILK 3 0 (SUTURE)
SUT SILK 3 0 SH CR/8 (SUTURE) ×3 IMPLANT
SUT SILK 3-0 18XBRD TIE 12 (SUTURE) IMPLANT
SUT V-LOC BARB 180 2/0GR6 GS22 (SUTURE)
SUT VIC AB 2-0 SH 18 (SUTURE) IMPLANT
SUT VIC AB 2-0 SH 27 (SUTURE)
SUT VIC AB 2-0 SH 27X BRD (SUTURE) IMPLANT
SUT VIC AB 3-0 SH 18 (SUTURE) IMPLANT
SUT VIC AB 3-0 SH 27 (SUTURE) ×3
SUT VIC AB 3-0 SH 27XBRD (SUTURE) IMPLANT
SUT VIC AB 4-0 PS2 27 (SUTURE) ×6 IMPLANT
SUT VICRYL 0 UR6 27IN ABS (SUTURE) ×3 IMPLANT
SUT VICRYL 4-0 PS2 18IN ABS (SUTURE) ×4 IMPLANT
SUTURE V-LC BRB 180 2/0GR6GS22 (SUTURE) IMPLANT
SYR 10ML ECCENTRIC (SYRINGE) ×3 IMPLANT
SYR CONTROL 10ML LL (SYRINGE) ×3 IMPLANT
SYS LAPSCP GELPORT 120MM (MISCELLANEOUS)
SYSTEM LAPSCP GELPORT 120MM (MISCELLANEOUS) IMPLANT
TOWEL OR 17X26 10 PK STRL BLUE (TOWEL DISPOSABLE) ×3 IMPLANT
TOWEL OR NON WOVEN STRL DISP B (DISPOSABLE) ×3 IMPLANT
TRAY COLON PACK (CUSTOM PROCEDURE TRAY) ×3 IMPLANT
TRAY FOLEY MTR SLVR 16FR STAT (SET/KITS/TRAYS/PACK) ×3 IMPLANT
TROCAR ADV FIXATION 5X100MM (TROCAR) ×3 IMPLANT
TUBING CONNECTING 10 (TUBING) ×4 IMPLANT
TUBING INSUFFLATION 10FT LAP (TUBING) ×3 IMPLANT
YANKAUER SUCT BULB TIP 10FT TU (MISCELLANEOUS) IMPLANT

## 2019-10-15 NOTE — Anesthesia Procedure Notes (Signed)
Procedure Name: Intubation Date/Time: 10/15/2019 12:57 PM Performed by: Maxwell Caul, CRNA Pre-anesthesia Checklist: Patient identified, Emergency Drugs available, Suction available and Patient being monitored Patient Re-evaluated:Patient Re-evaluated prior to induction Oxygen Delivery Method: Circle system utilized Preoxygenation: Pre-oxygenation with 100% oxygen Induction Type: IV induction Ventilation: Mask ventilation without difficulty Laryngoscope Size: Mac and 4 Grade View: Grade II Tube type: Oral Tube size: 7.5 mm Number of attempts: 1 Airway Equipment and Method: Stylet Placement Confirmation: ETT inserted through vocal cords under direct vision,  positive ETCO2 and breath sounds checked- equal and bilateral Secured at: 22 cm Tube secured with: Tape Dental Injury: Teeth and Oropharynx as per pre-operative assessment

## 2019-10-15 NOTE — Transfer of Care (Signed)
Immediate Anesthesia Transfer of Care Note  Patient: Brandon Barry  Procedure(s) Performed: XI ROBOTIC ASSISTED LOWER ANTERIOR RESECTION, RIGID PROCTOSCOPY (N/A Abdomen) PORT REMOVAL (N/A )  Patient Location: PACU  Anesthesia Type:General  Level of Consciousness: awake, oriented, patient cooperative and responds to stimulation  Airway & Oxygen Therapy: Patient Spontanous Breathing and Patient connected to face mask oxygen  Post-op Assessment: Report given to RN and Post -op Vital signs reviewed and stable  Post vital signs: Reviewed and stable  Last Vitals:  Vitals Value Taken Time  BP 139/101 10/15/19 1615  Temp    Pulse 64 10/15/19 1623  Resp 13 10/15/19 1623  SpO2 100 % 10/15/19 1623  Vitals shown include unvalidated device data.  Last Pain:  Vitals:   10/15/19 1212  TempSrc:   PainSc: 4       Patients Stated Pain Goal: 3 ([id number])  Complications: No apparent anesthesia complications

## 2019-10-15 NOTE — H&P (Signed)
The patient is a 44 year old male who presents with colorectal cancer. 44 year old male who presented to the office with a new diagnosis of rectal cancer. He had been having abdominal pain and weight loss for the past couple months. A colonoscopy was performed which showed a mid rectal mass. Biopsies reveal adenocarcinoma with possibility of Lynch syndrome. He has not had a CEA drawn. CT abdomen and pelvis show a rectal mass and a possible liver mass. MRI of the liver is indeterminate hemangioma versus metastatic disease. CT chest was negative. He underwent total neoadjuvant chemotherapy with radiation afterwards. Final Rt was 2/24. His lesion was tattooed.    Problem List/Past Medical RECTAL CANCER (C20)  Past Surgical History Knee Surgery Left. Oral Surgery  Diagnostic Studies History  Colonoscopy within last year  Allergies  No Known Drug Allergies [02/18/2019]:  Medication History  oxyCODONE-Acetaminophen (5-325MG  Tablet, Oral) Active. Medications Reconciled  Social History  Alcohol use Occasional alcohol use. Caffeine use Coffee, Tea. No drug use Tobacco use Former smoker.  Family History  Colon Polyps Father. Hypertension Father. Migraine Headache Mother.  Other Problems  Hemorrhoids Rectal Cancer Sleep Apnea     Review of Systems General Present- Fatigue and Weight Loss. Not Present- Appetite Loss, Chills, Fever, Night Sweats and Weight Gain. Skin Not Present- Change in Wart/Mole, Dryness, Hives, Jaundice, New Lesions, Non-Healing Wounds, Rash and Ulcer. HEENT Present- Seasonal Allergies. Not Present- Earache, Hearing Loss, Hoarseness, Nose Bleed, Oral Ulcers, Ringing in the Ears, Sinus Pain, Sore Throat, Visual Disturbances, Wears glasses/contact lenses and Yellow Eyes. Respiratory Not Present- Bloody sputum, Chronic Cough, Difficulty Breathing, Snoring and Wheezing. Breast Not Present- Breast Mass, Breast Pain, Nipple  Discharge and Skin Changes. Cardiovascular Not Present- Chest Pain, Difficulty Breathing Lying Down, Leg Cramps, Palpitations, Rapid Heart Rate, Shortness of Breath and Swelling of Extremities. Gastrointestinal Present- Abdominal Pain, Change in Bowel Habits and Rectal Pain. Not Present- Bloating, Bloody Stool, Chronic diarrhea, Constipation, Difficulty Swallowing, Excessive gas, Gets full quickly at meals, Hemorrhoids, Indigestion, Nausea and Vomiting. Male Genitourinary Not Present- Blood in Urine, Change in Urinary Stream, Frequency, Impotence, Nocturia, Painful Urination, Urgency and Urine Leakage. Musculoskeletal Not Present- Back Pain, Joint Pain, Joint Stiffness, Muscle Pain, Muscle Weakness and Swelling of Extremities. Neurological Not Present- Decreased Memory, Fainting, Headaches, Numbness, Seizures, Tingling, Tremor, Trouble walking and Weakness. Psychiatric Not Present- Anxiety, Bipolar, Change in Sleep Pattern, Depression, Fearful and Frequent crying. Endocrine Not Present- Cold Intolerance, Excessive Hunger, Hair Changes, Heat Intolerance and New Diabetes. Hematology Not Present- Blood Thinners, Easy Bruising, Excessive bleeding, Gland problems, HIV and Persistent Infections.  BP (!) 136/94   Pulse 64   Temp 98.7 F (37.1 C) (Oral)   Resp 17   Ht 6' (1.829 m)   Wt 75.9 kg   SpO2 100%   BMI 22.69 kg/m      Physical Exam   General Mental Status-Alert. General Appearance-Cooperative.  Chest and Lung Exam Chest and lung exam reveals -quiet, even and easy respiratory effort with no use of accessory muscles and on auscultation, normal breath sounds, no adventitious sounds and normal vocal resonance.  Cardiovascular Cardiovascular examination reveals -normal heart sounds, regular rate and rhythm with no murmurs.  Abdomen Palpation/Percussion Palpation and Percussion of the abdomen reveal - Soft and Non Tender.  Rectal Anorectal Exam Internal - Note: no  masses palpated.   PROCTOSIGMOIDOSCOPY/DIAGNOSTIC (45300) [ Rigid Sigmoidoscopy ] Physician: Patient status post neoadjuvant chemotherapy, here for evaluation of response. Preoperative Sigmoidoscope: After informed verbal consent, patient was placed in  knee-chest position and anal canal was dilated digitally. Rigid sigmoidoscope was then inserted into the anal canal and advanced to the level of tattoo in the mid rectum. There was a small mass noted at the distal edge of the tattooed. There was signs of stricture and inflammatory response. There was no other sign of tumor along the mucosal surface. Tattoo edge was located at ~10-12 cm from anal sphincter complex.  Performed: 08/25/2019 11:09 AM    Assessment & Plan   RECTAL CANCER (C20) Impression: 44 year old male who presents to the office today for evaluation of treatment response after TNT. He does have a symptomatic rectal stricture. This appears to be approximately 1 cm in diameter. On rigid proctoscopy. There is a small residual mass in the distal portion of the tattooed region. Distal tattoo rests approximately 12 cm from the anal verge. Given that he has a symptomatic stricture, I have recommended that he proceed with low anterior resection. I think the likelihood of needing a diverting ileostomy is low. The surgery and anatomy were described to the patient as well as the risks of surgery and the possible complications. These include: Bleeding, deep abdominal infections and possible wound complications such as hernia and infection, damage to adjacent structures, leak of surgical connections, which can lead to other surgeries and possibly an ostomy, possible need for other procedures, such as abscess drains in radiology, possible prolonged hospital stay, possible diarrhea from removal of part of the colon, possible constipation from narcotics, possible bowel, bladder or sexual dysfunction if having rectal surgery, prolonged  fatigue/weakness or appetite loss, possible early recurrence of of disease, possible complications of their medical problems such as heart disease or arrhythmias or lung problems, death (less than 1%). I believe the patient understands and wishes to proceed with the surgery.

## 2019-10-15 NOTE — Anesthesia Preprocedure Evaluation (Signed)
Anesthesia Evaluation  Patient identified by MRN, date of birth, ID band Patient awake    Reviewed: Allergy & Precautions, NPO status , Patient's Chart, lab work & pertinent test results  Airway Mallampati: II  TM Distance: >3 FB Neck ROM: Full    Dental  (+) Chipped,    Pulmonary sleep apnea and Continuous Positive Airway Pressure Ventilation , former smoker,    Pulmonary exam normal breath sounds clear to auscultation       Cardiovascular negative cardio ROS Normal cardiovascular exam Rhythm:Regular Rate:Normal     Neuro/Psych PSYCHIATRIC DISORDERS Anxiety Depression    GI/Hepatic negative GI ROS, Neg liver ROS,   Endo/Other  negative endocrine ROS  Renal/GU negative Renal ROS  negative genitourinary   Musculoskeletal negative musculoskeletal ROS (+) Chronic back pain   Abdominal Normal abdominal exam  (+)   Peds  Hematology negative hematology ROS (+) HLD   Anesthesia Other Findings RECTAL CANCER  Reproductive/Obstetrics                             Anesthesia Physical  Anesthesia Plan  ASA: II  Anesthesia Plan: General   Post-op Pain Management:    Induction: Intravenous  PONV Risk Score and Plan: 2 and Ondansetron, Dexamethasone, Midazolam and Treatment may vary due to age or medical condition  Airway Management Planned: Oral ETT  Additional Equipment: None  Intra-op Plan:   Post-operative Plan: Extubation in OR  Informed Consent: I have reviewed the patients History and Physical, chart, labs and discussed the procedure including the risks, benefits and alternatives for the proposed anesthesia with the patient or authorized representative who has indicated his/her understanding and acceptance.     Dental advisory given  Plan Discussed with: CRNA  Anesthesia Plan Comments:         Anesthesia Quick Evaluation

## 2019-10-15 NOTE — Op Note (Signed)
10/15/2019  3:55 PM  PATIENT:  Brandon Barry  44 y.o. male  Patient Care Team: [facility] as PCP - General (Internal Medicine)  PRE-OPERATIVE DIAGNOSIS:  RECTAL CANCER  POST-OPERATIVE DIAGNOSIS:  RECTAL CANCER  PROCEDURE:  XI ROBOTIC ASSISTED LOWER ANTERIOR RESECTION, INTRAOPERATIVE ASSESSMENT OF VASCULAR PERFUSION RIGID PROCTOSCOPY  PORT REMOVAL   Surgeon(s): Leighton Ruff, MD Michael Boston, MD  ASSISTANT: Dr Johney Maine   ANESTHESIA:   local and general  EBL: 25ml  Total I/O In: 1100 [I.V.:1000; IV Piggyback:100] Out: 200 [Urine:150; Blood:50]  Delay start of Pharmacological VTE agent (>24hrs) due to surgical blood loss or risk of bleeding:  no  DRAINS: (68F) Jackson-Pratt drain(s) with closed bulb suction in the pelvis   SPECIMEN:  Source of Specimen:  Rectum and sigmoid  DISPOSITION OF SPECIMEN:  PATHOLOGY  COUNTS:  YES  PLAN OF CARE: Admit to inpatient   PATIENT DISPOSITION:  PACU - hemodynamically stable.  INDICATION:    44 y.o. M with proximal rectal cancer.  I recommended segmental resection:  The anatomy & physiology of the digestive tract was discussed.  The pathophysiology was discussed.  Natural history risks without surgery was discussed.   I worked to give an overview of the disease and the frequent need to have multispecialty involvement.  I feel the risks of no intervention will lead to serious problems that outweigh the operative risks; therefore, I recommended a partial colectomy to remove the pathology.  Laparoscopic & open techniques were discussed.   Risks such as bleeding, infection, abscess, leak, reoperation, possible ostomy, hernia, heart attack, death, and other risks were discussed.  I noted a good likelihood this will help address the problem.   Goals of post-operative recovery were discussed as well.    The patient expressed understanding & wished to proceed with surgery.  OR FINDINGS:   Patient had proximal rectal  stricture at ~12 cm at the peritoneal reflection.  Tattoo noted proximal and distal to the stricture.  No obvious metastatic disease on visceral parietal peritoneum or liver.  The anastomosis rests 9 cm from the anal verge by rigid proctoscopy.  DESCRIPTION:   Informed consent was confirmed.  The patient underwent general anaesthesia without difficulty.  The patient was positioned appropriately.  VTE prevention in place.  The patient's abdomen was clipped, prepped, & draped in a sterile fashion.  Surgical timeout confirmed our plan.  The patient was positioned in reverse Trendelenburg.  Abdominal entry was gained using a Varies.  Entry was clean.  I induced carbon dioxide insufflation.  An 5mm robotic port was placed in the RUQ.  Camera inspection revealed no injury.  Extra ports were carefully placed under direct laparoscopic visualization.  I laparoscopically reflected the greater omentum and the upper abdomen the small bowel in the upper abdomen. The patient was appropriately positioned and the robot was docked to the patient's left side.  Instruments were placed under direct visualization.    I mobilized the sigmoid colon off of the pelvic sidewall.  I scored the base of peritoneum of the right side of the mesentery of the left colon from the ligament of Treitz to the peritoneal reflection of the mid rectum.  The patient had significant left pelvic sidewall adhesions.  I elevated the sigmoid mesentery and enetered into the retro-mesenteric plane. We were able to identify the left ureter and gonadal vessels. We kept those posterior within the retroperitoneum and elevated the left colon mesentery off that. I did isolated IMA pedicle but  did not ligate it yet.  I continued distally and got into the avascular plane posterior to the mesorectum. This allowed me to help mobilize the rectum as well by freeing the mesorectum off the sacrum.  I mobilized the peritoneal coverings towards the peritoneal  reflection on both the right and left sides of the rectum.  I could see the right and left ureters and stayed away from them.    I skeletonized the inferior mesenteric artery pedicle.  I went down to its takeoff from the aorta.   I isolated the inferior mesenteric vein off of the ligament of Treitz just cephalad to that as well.  After confirming the left ureter was out of the way, I went ahead and ligated the inferior mesenteric artery pedicle with bipolar robotic vessel sealer ~2cm above its takeoff from the aorta.   I did ligate the inferior mesenteric vein in a similar fashion.  We ensured hemostasis. I skeletonized the mesorectum at the junction at the proximal rectum using blunt dissection & bipolar robotic vessel sealer.  I mobilized the left colon in a lateral to medial fashion off the line of Toldt up towards the splenic flexure to ensure good mobilization of the left colon to reach into the pelvis.  I dissected down into the mesorectal plane posteriorly to approximately the level of the coccyx.  This was done using blunt dissection and the robotic vessel sealer.  I continue my dissection laterally dividing the lateral attachments as I went.  Anteriorly I was able to identify tattoo after I divided the peritoneal reflection.  There was an obvious stricture noted at this site.  I evaluated this with a rigid sigmoidoscopy and confirmed placement of the tattoo with the stricture.  I dissected out below the stricture at the level of the tattoo using the robotic vessel sealer to divide the rectal mesentery.  Once this was complete a 60 mm green load stapler was introduced into the pelvis and the rectum was divided at this site using 2 loads.  I divided the remainder of the mesentery using the robotic vessel sealer to the level of the proximal sigmoid colon.  This easily reached into the pelvis.  We did inject firefly to confirm vascular perfusion of the colon.  There was somewhat delayed but good perfusion  noted down to the level of my dissection.  The rectum appeared well perfused.  At this point the instruments were removed and the robot was undocked.    The 12 mm suprapubic port was enlarged to a Pfannenstiel incision.  An [place] wound protector was placed.  The colon was brought out through this and a pursestring device was placed on the previously dissected remaining colon.  The colon was divided and the specimen was sent to pathology for further examination.  The pursestring suture was secured using interrupted 3-0 silk sutures.  A 31 mm EEA anvil was then placed into the colon and the pursestring was tied tightly around this.  This was placed back into the abdomen and the cap was placed on the wound protector.  The abdomen was reinsufflated.  The EEA stapler was introduced into the rectum and the spike was brought out anterior to the staple line.  An anastomosis was created under laparoscopic visualization.  There was no tension on the colon.  There was no leak when tested with insufflation under irrigation.  Hemostasis was good.  The anastomosis rest approximately 9 cm from the anal verge.  A 19 [place] Blake drain  was placed into the pelvis and brought out through the right lower quadrant port site and secured into place with a 2-0 nylon suture.  We then remove the wound protector and all the other instruments.  We switched to clean gowns, gloves, instruments and drapes.  The Pfannenstiel incision was closed using a 0 Vicryl suture to close the peritoneum.  The fascia was closed using interrupted #1 Novafil sutures.  The subcutaneous tissue was reapproximated using a running 2-0 Vicryl suture.  The skin was closed with a running 4-0 Vicryl subcuticular suture.  A sterile dressing was applied.  The remainder port sites were closed using interrupted 4-0 Vicryl sutures and Dermabond.  The abdominal drapes were then removed.  We then prepped and draped the patient's right chest for port removal.  An incision  was made through the previous scar using a 15 blade scalpel.  Dissection was carried down to the level of the port using sharp dissection and electrocautery.  The Prolene suture fixing the port to the chest wall was identified and removed.  The port was then removed out of the cavity.  I placed a 3-0 Vicryl pursestring suture around the entrance to the catheter site.  The port was removed and this suture was tied tightly around it.  The subcutaneous tissue was then reapproximated using a running 3-0 Vicryl suture.  The skin was closed using a running 4-0 Vicryl subcuticular suture and Dermabond.  The patient was then awakened from anesthesia and sent to the postanesthesia care unit in stable condition.  All counts were correct per operating room staff. An MD assistant was necessary for tissue manipulation, retraction and positioning due to the complexity of the case and hospital policies

## 2019-10-15 NOTE — Anesthesia Postprocedure Evaluation (Signed)
Anesthesia Post Note  Patient: Brandon Barry  Procedure(s) Performed: XI ROBOTIC ASSISTED LOWER ANTERIOR RESECTION, RIGID PROCTOSCOPY (N/A Abdomen) PORT REMOVAL (N/A )     Patient location during evaluation: PACU Anesthesia Type: General Level of consciousness: sedated Pain management: pain level controlled Vital Signs Assessment: post-procedure vital signs reviewed and stable Respiratory status: spontaneous breathing Cardiovascular status: stable Postop Assessment: no apparent nausea or vomiting Anesthetic complications: no    Last Vitals:  Vitals:   10/15/19 1745 10/15/19 1755  BP: 122/87 (!) 139/99  Pulse: (!) 54 79  Resp: 16 18  Temp:  36.6 C  SpO2: 100% 100%    Last Pain:  Vitals:   10/15/19 1755  TempSrc: Oral  PainSc:    Pain Goal: Patients Stated Pain Goal: 3 (10/15/19 1212)                 Huston Foley

## 2019-10-16 ENCOUNTER — Encounter: Payer: Self-pay | Admitting: *Deleted

## 2019-10-16 LAB — CBC
HCT: 38.8 % — ABNORMAL LOW (ref 39.0–52.0)
Hemoglobin: 12.8 g/dL — ABNORMAL LOW (ref 13.0–17.0)
MCH: 31.6 pg (ref 26.0–34.0)
MCHC: 33 g/dL (ref 30.0–36.0)
MCV: 95.8 fL (ref 80.0–100.0)
Platelets: 203 10*3/uL (ref 150–400)
RBC: 4.05 MIL/uL — ABNORMAL LOW (ref 4.22–5.81)
RDW: 13 % (ref 11.5–15.5)
WBC: 10.7 10*3/uL — ABNORMAL HIGH (ref 4.0–10.5)
nRBC: 0 % (ref 0.0–0.2)

## 2019-10-16 LAB — BASIC METABOLIC PANEL
Anion gap: 7 (ref 5–15)
BUN: 8 mg/dL (ref 6–20)
CO2: 26 mmol/L (ref 22–32)
Calcium: 8.7 mg/dL — ABNORMAL LOW (ref 8.9–10.3)
Chloride: 103 mmol/L (ref 98–111)
Creatinine, Ser: 0.92 mg/dL (ref 0.61–1.24)
GFR calc Af Amer: >60 mL/min (ref >60)
GFR calc non Af Amer: >60 mL/min (ref >60)
Glucose, Bld: 134 mg/dL — ABNORMAL HIGH (ref 70–99)
Potassium: 4.4 mmol/L (ref 3.5–5.1)
Sodium: 136 mmol/L (ref 135–145)

## 2019-10-16 MED ORDER — HYDROCODONE-ACETAMINOPHEN 5-325 MG PO TABS
1.0000 | ORAL_TABLET | Freq: Four times a day (QID) | ORAL | Status: DC | PRN
  Administered 2019-10-16: 2 via ORAL
  Filled 2019-10-16: qty 2

## 2019-10-16 MED ORDER — ACETAMINOPHEN 500 MG PO TABS
1000.0000 mg | ORAL_TABLET | Freq: Four times a day (QID) | ORAL | Status: DC | PRN
  Administered 2019-10-16: 1000 mg via ORAL
  Filled 2019-10-16: qty 2

## 2019-10-16 MED ORDER — CHLORHEXIDINE GLUCONATE CLOTH 2 % EX PADS
6.0000 | MEDICATED_PAD | Freq: Every day | CUTANEOUS | Status: DC
  Administered 2019-10-19 – 2019-10-20 (×2): 6 via TOPICAL

## 2019-10-16 MED ORDER — HYDROMORPHONE HCL 1 MG/ML IJ SOLN
1.0000 mg | INTRAMUSCULAR | Status: DC | PRN
  Administered 2019-10-16: 1 mg via INTRAVENOUS
  Administered 2019-10-17 (×5): 2 mg via INTRAVENOUS
  Administered 2019-10-18 (×2): 1 mg via INTRAVENOUS
  Administered 2019-10-18: 2 mg via INTRAVENOUS
  Administered 2019-10-18: 1 mg via INTRAVENOUS
  Administered 2019-10-19: 2 mg via INTRAVENOUS
  Administered 2019-10-19: 1 mg via INTRAVENOUS
  Administered 2019-10-19 – 2019-10-20 (×2): 2 mg via INTRAVENOUS
  Filled 2019-10-16: qty 2
  Filled 2019-10-16 (×2): qty 1
  Filled 2019-10-16 (×8): qty 2
  Filled 2019-10-16: qty 1
  Filled 2019-10-16 (×2): qty 2

## 2019-10-16 NOTE — Progress Notes (Signed)
1 Day Post-Op Robotic LAR Subjective: Pain controlled with meds.  Tolerating clears.  No nausea  Objective: Vital signs in last 24 hours: Temp:  [97.7 F (36.5 C)-99.1 F (37.3 C)] 97.9 F (36.6 C) (05/06 0523) Pulse Rate:  [50-79] 56 (05/06 0523) Resp:  [13-26] 18 (05/06 0523) BP: (119-146)/(71-103) 119/71 (05/06 0523) SpO2:  [98 %-100 %] 99 % (05/06 0523) Weight:  [75.9 kg] 75.9 kg (05/05 1158)   Intake/Output from previous day: 05/05 0701 - 05/06 0700 In: 3139.6 [P.O.:600; I.V.:2339.6; IV Piggyback:200] Out: [id number] [Urine:1900; Drains:590; Stool:1; Blood:50] Intake/Output this shift: No intake/output data recorded.   General appearance: alert and cooperative GI: normal findings: soft, non-tender JP: SS fluid Incision: no significant drainage  Lab Results:  Recent Labs    10/16/19 0445  WBC 10.7*  HGB 12.8*  HCT 38.8*  PLT 203   BMET No results for input(s): NA, K, CL, CO2, GLUCOSE, BUN, CREATININE, CALCIUM in the last 72 hours. PT/INR No results for input(s): LABPROT, INR in the last 72 hours. ABG No results for input(s): PHART, HCO3 in the last 72 hours.  Invalid input(s): PCO2, PO2  MEDS, Scheduled . acetaminophen  1,000 mg Oral Q6H  . alvimopan  12 mg Oral BID  . enoxaparin (LOVENOX) injection  40 mg Subcutaneous Q24H  . feeding supplement  237 mL Oral BID BM  . gabapentin  600 mg Oral BID  . saccharomyces boulardii  250 mg Oral BID    Studies/Results: No results found.  Assessment: s/p Procedure(s): XI ROBOTIC ASSISTED LOWER ANTERIOR RESECTION, RIGID PROCTOSCOPY PORT REMOVAL Patient Active Problem List   Diagnosis Date Noted  . Rectal cancer ([place]) 10/15/2019  . Port-A-Cath in place 03/27/2019  . Cancer of rectum ([place]) 02/19/2019  . Malignant neoplasm of rectum ([place]) 02/11/2019  . ANXIETY 03/18/2007  . DEPRESSION 03/18/2007  . ALLERGIC RHINITIS 03/18/2007    Expected post op course  Plan: Advance diet to soft foods as tolerated SL  IVF's Ambulate    LOS: 1 day     .Rosario Adie, MD [facility], [place]    10/16/2019 7:51 AM

## 2019-10-16 NOTE — Progress Notes (Signed)
Pharmacy Brief Note - Alvimopan (Entereg)  The standing order set for alvimopan (Entereg) now includes an automatic order to discontinue the drug after the patient has had a bowel movement. The change was approved by the [place] and the Medical Executive Committee.   This patient has had bowel movements documented by nursing. Therefore, alvimopan has been discontinued. If there are questions, please contact the pharmacy at [phone].   Thank you-  Minda Ditto PharmD 10/16/2019, 1:06 PM

## 2019-10-16 NOTE — Progress Notes (Signed)
MD paged regarding uncontrolled pain and both PRN are not due at this time. Awaiting new orders.

## 2019-10-16 NOTE — Progress Notes (Signed)
MD paged regarding uncontrolled pain.

## 2019-10-16 NOTE — Progress Notes (Signed)
Norco ordered verbally. Pharmacy called RN to notify to not give until 11:11 due to patient being at max intake for tylenol until then.

## 2019-10-17 LAB — CBC
HCT: 42 % (ref 39.0–52.0)
Hemoglobin: 13.4 g/dL (ref 13.0–17.0)
MCH: 31 pg (ref 26.0–34.0)
MCHC: 31.9 g/dL (ref 30.0–36.0)
MCV: 97.2 fL (ref 80.0–100.0)
Platelets: 253 10*3/uL (ref 150–400)
RBC: 4.32 MIL/uL (ref 4.22–5.81)
RDW: 13.1 % (ref 11.5–15.5)
WBC: 10.8 10*3/uL — ABNORMAL HIGH (ref 4.0–10.5)
nRBC: 0 % (ref 0.0–0.2)

## 2019-10-17 LAB — BASIC METABOLIC PANEL
Anion gap: 8 (ref 5–15)
BUN: 11 mg/dL (ref 6–20)
CO2: 27 mmol/L (ref 22–32)
Calcium: 8.6 mg/dL — ABNORMAL LOW (ref 8.9–10.3)
Chloride: 103 mmol/L (ref 98–111)
Creatinine, Ser: 1 mg/dL (ref 0.61–1.24)
GFR calc Af Amer: >60 mL/min (ref >60)
GFR calc non Af Amer: >60 mL/min (ref >60)
Glucose, Bld: 116 mg/dL — ABNORMAL HIGH (ref 70–99)
Potassium: 3.9 mmol/L (ref 3.5–5.1)
Sodium: 138 mmol/L (ref 135–145)

## 2019-10-17 LAB — SURGICAL PATHOLOGY

## 2019-10-17 MED ORDER — OXYCODONE HCL 5 MG PO TABS
5.0000 mg | ORAL_TABLET | ORAL | Status: DC | PRN
  Administered 2019-10-17: 5 mg via ORAL
  Administered 2019-10-17 – 2019-10-19 (×10): 10 mg via ORAL
  Administered 2019-10-20: 5 mg via ORAL
  Administered 2019-10-20 (×4): 10 mg via ORAL
  Administered 2019-10-20: 5 mg via ORAL
  Administered 2019-10-21 (×2): 10 mg via ORAL
  Filled 2019-10-17 (×8): qty 2
  Filled 2019-10-17: qty 1
  Filled 2019-10-17 (×9): qty 2
  Filled 2019-10-17: qty 1

## 2019-10-17 MED ORDER — ACETAMINOPHEN 500 MG PO TABS
1000.0000 mg | ORAL_TABLET | Freq: Four times a day (QID) | ORAL | Status: DC
  Administered 2019-10-17 – 2019-10-21 (×12): 1000 mg via ORAL
  Filled 2019-10-17 (×13): qty 2

## 2019-10-17 MED ORDER — METHOCARBAMOL 500 MG PO TABS
1000.0000 mg | ORAL_TABLET | Freq: Three times a day (TID) | ORAL | Status: DC
  Administered 2019-10-17 – 2019-10-21 (×13): 1000 mg via ORAL
  Filled 2019-10-17 (×13): qty 2

## 2019-10-17 NOTE — Discharge Instructions (Signed)

## 2019-10-18 NOTE — Progress Notes (Signed)
3 Days Post-Op   Subjective/Chief Complaint: Complains of soreness but otherwise ok. Passing some flatus and bm   Objective: Vital signs in last 24 hours: Temp:  [98.3 F (36.8 C)-99.1 F (37.3 C)] 99.1 F (37.3 C) (05/07 2146) Pulse Rate:  [76-102] 76 (05/08 0614) Resp:  [17-18] 18 (05/08 0614) BP: (127-135)/(80-92) 135/86 (05/08 0614) SpO2:  [95 %-98 %] 98 % (05/08 0614) Last BM Date: 10/16/19  Intake/Output from previous day: 05/07 0701 - 05/08 0700 In: 960 [P.O.:960] Out: 160 [Drains:160] Intake/Output this shift: No intake/output data recorded.  General appearance: alert and cooperative Resp: clear to auscultation bilaterally Cardio: regular rate and rhythm GI: soft, moderate tenderness. incision looks good. good bs. drain output clear and slightly dark  Lab Results:  Recent Labs    10/16/19 0445 10/17/19 0418  WBC 10.7* 10.8*  HGB 12.8* 13.4  HCT 38.8* 42.0  PLT 203 253   BMET Recent Labs    10/16/19 0445 10/17/19 0418  NA 136 138  K 4.4 3.9  CL 103 103  CO2 26 27  GLUCOSE 134* 116*  BUN 8 11  CREATININE 0.92 1.00  CALCIUM 8.7* 8.6*   PT/INR No results for input(s): LABPROT, INR in the last 72 hours. ABG No results for input(s): PHART, HCO3 in the last 72 hours.  Invalid input(s): PCO2, PO2  Studies/Results: No results found.  Anti-infectives: Anti-infectives (From admission, onward)   Start     Dose/Rate Route Frequency Ordered Stop   10/16/19 0100  cefoTEtan (CEFOTAN) 2 g in sodium chloride 0.9 % 100 mL IVPB     2 g 200 mL/hr over 30 Minutes Intravenous Every 12 hours 10/15/19 1752 10/16/19 1709   10/15/19 1202  sodium chloride 0.9 % with cefoTEtan (CEFOTAN) ADS Med    Note to Pharmacy: Kyra Leyland   : cabinet override      10/15/19 1202 10/15/19 1309   10/15/19 1200  cefoTEtan (CEFOTAN) 2 g in sodium chloride 0.9 % 100 mL IVPB     2 g 200 mL/hr over 30 Minutes Intravenous On call to O.R. 10/15/19 1156 10/15/19 1330       Assessment/Plan: s/p Procedure(s): XI ROBOTIC ASSISTED LOWER ANTERIOR RESECTION, RIGID PROCTOSCOPY (N/A) PORT REMOVAL (N/A) Advance diet  POD 3 Ambulate Continue drain  LOS: 3 days    Brandon Barry 10/18/2019

## 2019-10-18 NOTE — Plan of Care (Signed)

## 2019-10-19 LAB — CBC WITH DIFFERENTIAL/PLATELET
Abs Immature Granulocytes: 0.04 10*3/uL (ref 0.00–0.07)
Basophils Absolute: 0 10*3/uL (ref 0.0–0.1)
Basophils Relative: 0 %
Eosinophils Absolute: 0 10*3/uL (ref 0.0–0.5)
Eosinophils Relative: 0 %
HCT: 41.9 % (ref 39.0–52.0)
Hemoglobin: 14.1 g/dL (ref 13.0–17.0)
Immature Granulocytes: 0 %
Lymphocytes Relative: 2 %
Lymphs Abs: 0.2 10*3/uL — ABNORMAL LOW (ref 0.7–4.0)
MCH: 31.3 pg (ref 26.0–34.0)
MCHC: 33.7 g/dL (ref 30.0–36.0)
MCV: 93.1 fL (ref 80.0–100.0)
Monocytes Absolute: 0.9 10*3/uL (ref 0.1–1.0)
Monocytes Relative: 8 %
Neutro Abs: 9.5 10*3/uL — ABNORMAL HIGH (ref 1.7–7.7)
Neutrophils Relative %: 90 %
Platelets: 271 10*3/uL (ref 150–400)
RBC: 4.5 MIL/uL (ref 4.22–5.81)
RDW: 13 % (ref 11.5–15.5)
WBC: 10.7 10*3/uL — ABNORMAL HIGH (ref 4.0–10.5)
nRBC: 0 % (ref 0.0–0.2)

## 2019-10-19 LAB — BASIC METABOLIC PANEL
Anion gap: 10 (ref 5–15)
BUN: 13 mg/dL (ref 6–20)
CO2: 25 mmol/L (ref 22–32)
Calcium: 8.7 mg/dL — ABNORMAL LOW (ref 8.9–10.3)
Chloride: 98 mmol/L (ref 98–111)
Creatinine, Ser: 0.87 mg/dL (ref 0.61–1.24)
GFR calc Af Amer: >60 mL/min (ref >60)
GFR calc non Af Amer: >60 mL/min (ref >60)
Glucose, Bld: 134 mg/dL — ABNORMAL HIGH (ref 70–99)
Potassium: 4.4 mmol/L (ref 3.5–5.1)
Sodium: 133 mmol/L — ABNORMAL LOW (ref 135–145)

## 2019-10-19 MED ORDER — POLYETHYLENE GLYCOL 3350 17 G PO PACK
17.0000 g | PACK | Freq: Every day | ORAL | Status: DC
  Administered 2019-10-19: 17 g via ORAL
  Filled 2019-10-19: qty 1

## 2019-10-19 NOTE — Progress Notes (Signed)
Dr. Marlou Starks paged for medication for constipation.

## 2019-10-19 NOTE — Progress Notes (Signed)
4 Days Post-Op   Subjective/Chief Complaint: Pt had significantly more pain yesterday after having a bm. No fever   Objective: Vital signs in last 24 hours: Temp:  [98.4 F (36.9 C)-100.3 F (37.9 C)] 98.4 F (36.9 C) (05/09 0606) Pulse Rate:  [102-108] 102 (05/09 0606) Resp:  [18] 18 (05/09 0606) BP: (130-146)/(83-97) 130/93 (05/09 0606) SpO2:  [95 %-98 %] 96 % (05/09 0606) Last BM Date: 10/18/19  Intake/Output from previous day: 05/08 0701 - 05/09 0700 In: 1320 [P.O.:1320] Out: 1020 [Urine:925; Drains:95] Intake/Output this shift: No intake/output data recorded.  General appearance: alert and cooperative Resp: clear to auscultation bilaterally Cardio: regular rate and rhythm GI: soft, moderate tenderness. good bs. incision ok. drain output still dark but clear  Lab Results:  Recent Labs    10/17/19 0418  WBC 10.8*  HGB 13.4  HCT 42.0  PLT 253   BMET Recent Labs    10/17/19 0418  NA 138  K 3.9  CL 103  CO2 27  GLUCOSE 116*  BUN 11  CREATININE 1.00  CALCIUM 8.6*   PT/INR No results for input(s): LABPROT, INR in the last 72 hours. ABG No results for input(s): PHART, HCO3 in the last 72 hours.  Invalid input(s): PCO2, PO2  Studies/Results: No results found.  Anti-infectives: Anti-infectives (From admission, onward)   Start     Dose/Rate Route Frequency Ordered Stop   10/16/19 0100  cefoTEtan (CEFOTAN) 2 g in sodium chloride 0.9 % 100 mL IVPB     2 g 200 mL/hr over 30 Minutes Intravenous Every 12 hours 10/15/19 1752 10/16/19 1709   10/15/19 1202  sodium chloride 0.9 % with cefoTEtan (CEFOTAN) ADS Med    Note to Pharmacy: Kyra Leyland   : cabinet override      10/15/19 1202 10/15/19 1309   10/15/19 1200  cefoTEtan (CEFOTAN) 2 g in sodium chloride 0.9 % 100 mL IVPB     2 g 200 mL/hr over 30 Minutes Intravenous On call to O.R. 10/15/19 1156 10/15/19 1330      Assessment/Plan: s/p Procedure(s): XI ROBOTIC ASSISTED LOWER ANTERIOR RESECTION,  RIGID PROCTOSCOPY (N/A) PORT REMOVAL (N/A) will recheck lytes and wbc today given increased pain  Ambulate POD 4  Continue drain  LOS: 4 days    Autumn Messing III 10/19/2019

## 2019-10-19 NOTE — Plan of Care (Signed)
  Problem: Clinical Measurements: Goal: Respiratory complications will improve Outcome: Progressing   Problem: Clinical Measurements: Goal: Cardiovascular complication will be avoided Outcome: Progressing   Problem: Activity: Goal: Risk for activity intolerance will decrease Outcome: Progressing   Problem: Nutrition: Goal: Adequate nutrition will be maintained Outcome: Progressing   Problem: Elimination: Goal: Will not experience complications related to bowel motility Outcome: Progressing   Problem: Safety: Goal: Ability to remain free from injury will improve Outcome: Progressing   Problem: Skin Integrity: Goal: Risk for impaired skin integrity will decrease Outcome: Progressing

## 2019-10-20 MED ORDER — POLYETHYLENE GLYCOL 3350 17 G PO PACK: 17.0000 g | PACK | Freq: Two times a day (BID) | ORAL | Status: DC | PRN

## 2019-10-20 MED ORDER — KETOROLAC TROMETHAMINE 30 MG/ML IJ SOLN
30.0000 mg | Freq: Four times a day (QID) | INTRAMUSCULAR | Status: DC
  Administered 2019-10-20 – 2019-10-21 (×4): 30 mg via INTRAVENOUS
  Filled 2019-10-20 (×4): qty 1

## 2019-10-20 MED ORDER — HYDROMORPHONE HCL 1 MG/ML IJ SOLN: 0.5000 mg | INTRAMUSCULAR | Status: DC | PRN

## 2019-10-20 MED ORDER — PSYLLIUM 95 % PO PACK
1.0000 | PACK | Freq: Two times a day (BID) | ORAL | Status: DC
  Administered 2019-10-20 – 2019-10-21 (×3): 1 via ORAL
  Filled 2019-10-20 (×4): qty 1

## 2019-10-20 NOTE — Plan of Care (Signed)

## 2019-10-20 NOTE — Progress Notes (Signed)
JP removed and it had a good amount drainage, PA was paged and said to monitor and if it drainage was bloody or thicker and didn't slow down to let her know. Jerene Pitch PA).

## 2019-10-20 NOTE — Progress Notes (Addendum)
Brandon Barry [id number] 1976-03-23  CARE TEAM:  PCP: [place], [place]  Outpatient Care Team: Patient Care Team: [facility] [place] as PCP - General (Internal Medicine)  Inpatient Treatment Team: Treatment Team: Attending Provider: Leighton Ruff, MD; Consulting Physician: Ladell Pier, MD; Technician: Cloria Spring, [place]; Registered Nurse: Kerney Elbe, RN; Utilization Review: Darryll Capers, RN; Consulting Physician: Edison Pace, Md, MD   Problem List:   Active Problems:   Rectal cancer ([facility])   5 Days Post-Op  10/15/2019  Procedure(s): XI ROBOTIC ASSISTED LOWER ANTERIOR RESECTION, RIGID PROCTOSCOPY PORT REMOVAL    Assessment  POD5 s/p robotic assisted LAR and port removal  Dr [facility] Stay = 5 days)  Plan:  -Optimize nausea control  -JP drain with minimal output. Possible d/c drain within next 1-3 days  -Continue pain control  -Continue advanced diet as tolerated  -VTE prophylaxis- SCDs, etc  -mobilize as tolerated to help recovery  30 minutes spent in review, evaluation, examination, counseling, and coordination of care.  More than 50% of that time was spent in counseling.  D/C patient from hospital when patient meets criteria (anticipate in 1-3 day(s)):  Tolerating oral intake well Ambulating well Adequate pain control without IV medications Urinating  Having flatus Disposition planning in place   10/20/2019    Subjective:  Patient overall feeling much better this morning. Pain improved and walked hallway last night.  3 episodes of emesis last night while he was moving his bowels. Denies any nausea/vomiting since and tolerated breakfast this morning.  Drain with <20cc serosanguinous output this morning.  Patient denies abdominal pain, fevers, chills, and shortness of breath.   Objective:  Vital signs:  Vitals:   10/19/19 0606 10/19/19 1406 10/19/19 2020 10/20/19 0634  BP: (!) 130/93 (!) 133/98 131/85 (!)  139/94  Pulse: (!) 102 100 98 94  Resp: 18 16 16 16   Temp: 98.4 F (36.9 C) 98.8 F (37.1 C) 100 F (37.8 C) 98.2 F (36.8 C)  TempSrc: Oral Oral Oral Oral  SpO2: 96% 97% 96% 100%  Weight:      Height:        Last BM Date: 10/20/19  Intake/Output   Yesterday:  05/09 0701 - 05/10 0700 In: 1440 [P.O.:1440] Out: 541 [Urine:500; Drains:41] This shift:  No intake/output data recorded.  Bowel function:  Flatus: YES  BM:  YES  Drain: Serosanguinous   Physical Exam:  General: Pt awake/alert in no acute distress Eyes: PERRL, normal EOM.  Sclera clear.  No icterus Neuro: CN II-XII intact w/o focal sensory/motor deficits. Lymph: No head/neck/groin lymphadenopathy Psych:  No delerium/psychosis/paranoia.  Oriented x 4 HENT: Normocephalic, Mucus membranes moist.  No thrush Neck: Supple, No tracheal deviation.  No obvious thyromegaly Chest: No pain to chest wall compression.  Good respiratory excursion.  No audible wheezing CV:  Pulses intact.  Regular rhythm.  No major extremity edema MS: Normal AROM mjr joints.  No obvious deformity  Abdomen: Soft.  Mildy distended.  Nontender.  No evidence of peritonitis.  No incarcerated hernias.  Ext:  Incisions clean/dry/intact. Dermabond over incision sites. Drain appropriately placed. No deformity.  No mjr edema.  No cyanosis Skin: No petechiae / purpurea.  No major sores.  Warm and dry    Results:   Cultures: Recent Results (from the past 720 hour(s))  SARS CORONAVIRUS 2 (TAT 6-24 HRS) Nasopharyngeal Nasopharyngeal Swab     Status: None   Collection Time: 10/11/19  1:49 PM   Specimen: Nasopharyngeal Swab  Result Value Ref Range Status   SARS Coronavirus 2 NEGATIVE NEGATIVE Final    Comment: (NOTE) SARS-CoV-2 target nucleic acids are NOT DETECTED. The SARS-CoV-2 RNA is generally detectable in upper and lower respiratory specimens during the acute phase of infection. Negative results do not preclude SARS-CoV-2 infection, do not  rule out co-infections with other pathogens, and should not be used as the sole basis for treatment or other patient management decisions. Negative results must be combined with clinical observations, patient history, and epidemiological information. The expected result is Negative. Fact Sheet for Patients: SugarRoll.be Fact Sheet for Healthcare Providers: https://www.woods-mathews.com/ This test is not yet approved or cleared by the [place] FDA and  has been authorized for detection and/or diagnosis of SARS-CoV-2 by FDA under an Emergency Use Authorization (EUA). This EUA will remain  in effect (meaning this test can be used) for the duration of the COVID-19 declaration under Section 56 4(b)(1) of the Act, 21 U.S.C. section 360bbb-3(b)(1), unless the authorization is terminated or revoked sooner. Performed at [facility], [place] [address] [place], [place] [address]     Labs: Results for orders placed or performed during the hospital encounter of 10/15/19 (from the past 48 hour(s))  Basic metabolic panel     Status: Abnormal   Collection Time: 10/19/19  9:06 AM  Result Value Ref Range   Sodium 133 (L) 135 - 145 mmol/L   Potassium 4.4 3.5 - 5.1 mmol/L   Chloride 98 98 - 111 mmol/L   CO2 25 22 - 32 mmol/L   Glucose, Bld 134 (H) 70 - 99 mg/dL    Comment: Glucose reference range applies only to samples taken after fasting for at least 8 hours.   BUN 13 6 - 20 mg/dL   Creatinine, Ser 0.87 0.61 - 1.24 mg/dL   Calcium 8.7 (L) 8.9 - 10.3 mg/dL   GFR calc non Af Amer >60 >60 mL/min   GFR calc Af Amer >60 >60 mL/min   Anion gap 10 5 - 15    Comment: Performed at [facility], [place] [address] [place], [place] [address]  CBC with Differential/Platelet     Status: Abnormal   Collection Time: 10/19/19  9:06 AM  Result Value Ref Range   WBC 10.7 (H) 4.0 - 10.5 K/uL   RBC 4.50 4.22 - 5.81 MIL/uL   Hemoglobin 14.1 13.0  - 17.0 g/dL   HCT 41.9 39.0 - 52.0 %   MCV 93.1 80.0 - 100.0 fL   MCH 31.3 26.0 - 34.0 pg   MCHC 33.7 30.0 - 36.0 g/dL   RDW 13.0 11.5 - 15.5 %   Platelets 271 150 - 400 K/uL   nRBC 0.0 0.0 - 0.2 %   Neutrophils Relative % 90 %   Neutro Abs 9.5 (H) 1.7 - 7.7 K/uL   Lymphocytes Relative 2 %   Lymphs Abs 0.2 (L) 0.7 - 4.0 K/uL   Monocytes Relative 8 %   Monocytes Absolute 0.9 0.1 - 1.0 K/uL   Eosinophils Relative 0 %   Eosinophils Absolute 0.0 0.0 - 0.5 K/uL   Basophils Relative 0 %   Basophils Absolute 0.0 0.0 - 0.1 K/uL   Immature Granulocytes 0 %   Abs Immature Granulocytes 0.04 0.00 - 0.07 K/uL    Comment: Performed at [facility], [place] [address] [place], [place] [address]    Imaging / Studies: No results found.  Medications / Allergies: per chart  Antibiotics: Anti-infectives (From  admission, onward)   Start     Dose/Rate Route Frequency Ordered Stop   10/16/19 0100  cefoTEtan (CEFOTAN) 2 g in sodium chloride 0.9 % 100 mL IVPB     2 g 200 mL/hr over 30 Minutes Intravenous Every 12 hours 10/15/19 1752 10/16/19 1709   10/15/19 1202  sodium chloride 0.9 % with cefoTEtan (CEFOTAN) ADS Med    Note to Pharmacy: Kyra Leyland   : cabinet override      10/15/19 1202 10/15/19 1309   10/15/19 1200  cefoTEtan (CEFOTAN) 2 g in sodium chloride 0.9 % 100 mL IVPB     2 g 200 mL/hr over 30 Minutes Intravenous On call to O.R. 10/15/19 1156 10/15/19 1330        Note: Portions of this report may have been transcribed using voice recognition software. Every effort was made to ensure accuracy; however, inadvertent computerized transcription errors may be present.   Any transcriptional errors that result from this process are unintentional.    Orbie Pyo, PA-S Patient was seen and evaluated in coordination with Dr. Leighton Ruff, MD   [id number]  8:37 AM

## 2019-10-21 LAB — CBC
HCT: 35.5 % — ABNORMAL LOW (ref 39.0–52.0)
Hemoglobin: 11.6 g/dL — ABNORMAL LOW (ref 13.0–17.0)
MCH: 30.5 pg (ref 26.0–34.0)
MCHC: 32.7 g/dL (ref 30.0–36.0)
MCV: 93.4 fL (ref 80.0–100.0)
Platelets: 278 10*3/uL (ref 150–400)
RBC: 3.8 MIL/uL — ABNORMAL LOW (ref 4.22–5.81)
RDW: 13.1 % (ref 11.5–15.5)
WBC: 10.6 10*3/uL — ABNORMAL HIGH (ref 4.0–10.5)
nRBC: 0 % (ref 0.0–0.2)

## 2019-10-21 MED ORDER — OXYCODONE HCL 5 MG PO TABS: 5.0000 mg | ORAL_TABLET | Freq: Four times a day (QID) | ORAL | 0 refills | Status: DC | PRN

## 2019-10-21 NOTE — Discharge Summary (Signed)
Physician Discharge Summary  Patient ID: Brandon Barry MRN: [id number] DOB/AGE: [birth year] 44 y.o.  Admit date: 10/15/2019 Discharge date: 10/21/2019  Admission Diagnoses: rectal cancer  Discharge Diagnoses:  Active Problems:   Rectal cancer [facility])   Discharged Condition: good  Hospital Course: Pt admitted after surgery.  His diet was advanced as tolerated.  He had some difficulty with post op pain control.  This seemed to improve with JP removal and Toradol.  He was ready to d/c by POD 6.  Labs post op were stable with no signs of post op infection.  Consults: None  Significant Diagnostic Studies: labs: cbc, bmet  Treatments: IV hydration, analgesia: Dilaudid and oxycodone and surgery: robotic LAR  Discharge Exam: Blood pressure 131/83, pulse 65, temperature 98.4 F (36.9 C), resp. rate 16, height 6' (1.829 m), weight 78.4 kg, SpO2 100 %. General appearance: alert and cooperative GI: normal findings: soft, non-tender Incision/Wound: clean, dry, intact  Disposition: Discharge disposition: 01-Home or Self Care        Allergies as of 10/21/2019   No Known Allergies     Medication List    STOP taking these medications   oxyCODONE-acetaminophen 5-325 MG tablet Commonly known as: PERCOCET/ROXICET     TAKE these medications   gabapentin 300 MG capsule Commonly known as: NEURONTIN Take 2 capsules (600 mg total) by mouth 2 (two) times daily.   lidocaine-prilocaine cream Commonly known as: EMLA Apply 1 application topically as directed. Apply 1 hour prior to stick and cover with plastic wrap   LORazepam 0.5 MG tablet Commonly known as: ATIVAN Take 1 tablet (0.5 mg total) by mouth every 8 (eight) hours as needed for anxiety. Or nausea. Do not drive while taking   oxyCODONE 5 MG immediate release tablet Commonly known as: Oxy IR/ROXICODONE Take 1-2 tablets (5-10 mg total) by mouth every 6 (six) hours as needed for moderate pain, severe pain or breakthrough pain.       Follow-up Information    Leighton Ruff, MD. Schedule an appointment as soon as possible for a visit in 2 week(s).   Specialty: General Surgery Contact information: [place] [address] [phone]           Signed: Rosario Adie [id number], 8:21 AM

## 2019-10-21 NOTE — Plan of Care (Signed)
resolved 

## 2019-10-22 ENCOUNTER — Other Ambulatory Visit: Payer: Self-pay

## 2019-10-28 ENCOUNTER — Inpatient Hospital Stay: Payer: Commercial Managed Care - PPO | Admitting: Nurse Practitioner

## 2019-10-31 ENCOUNTER — Encounter (HOSPITAL_COMMUNITY): Payer: Self-pay

## 2019-10-31 ENCOUNTER — Other Ambulatory Visit: Payer: Self-pay

## 2019-10-31 ENCOUNTER — Inpatient Hospital Stay (HOSPITAL_COMMUNITY)
Admission: AD | Admit: 2019-10-31 | Discharge: 2019-12-03 | DRG: 907 | Disposition: A | Discharge status: Home Health | Payer: Commercial Managed Care - PPO | Source: Ambulatory Visit | Attending: General Surgery | Admitting: General Surgery

## 2019-10-31 ENCOUNTER — Other Ambulatory Visit (HOSPITAL_COMMUNITY): Payer: Self-pay | Admitting: Student

## 2019-10-31 ENCOUNTER — Other Ambulatory Visit: Payer: Self-pay | Admitting: Student

## 2019-10-31 ENCOUNTER — Ambulatory Visit (HOSPITAL_COMMUNITY)
Admission: RE | Admit: 2019-10-31 | Discharge: 2019-10-31 | Disposition: A | Discharge status: Home / Self Care | Payer: Commercial Managed Care - PPO | Source: Ambulatory Visit | Attending: Student | Admitting: Student

## 2019-10-31 DIAGNOSIS — K651 Peritoneal abscess: Secondary | ICD-10-CM | POA: Diagnosis present

## 2019-10-31 DIAGNOSIS — M25511 Pain in right shoulder: Secondary | ICD-10-CM | POA: Diagnosis present

## 2019-10-31 DIAGNOSIS — G629 Polyneuropathy, unspecified: Secondary | ICD-10-CM

## 2019-10-31 DIAGNOSIS — E860 Dehydration: Secondary | ICD-10-CM | POA: Diagnosis present

## 2019-10-31 DIAGNOSIS — G8929 Other chronic pain: Secondary | ICD-10-CM | POA: Diagnosis present

## 2019-10-31 DIAGNOSIS — Y832 Surgical operation with anastomosis, bypass or graft as the cause of abnormal reaction of the patient, or of later complication, without mention of misadventure at the time of the procedure: Secondary | ICD-10-CM | POA: Diagnosis present

## 2019-10-31 DIAGNOSIS — M549 Dorsalgia, unspecified: Secondary | ICD-10-CM | POA: Diagnosis present

## 2019-10-31 DIAGNOSIS — K9189 Other postprocedural complications and disorders of digestive system: Secondary | ICD-10-CM | POA: Diagnosis present

## 2019-10-31 DIAGNOSIS — Z923 Personal history of irradiation: Secondary | ICD-10-CM

## 2019-10-31 DIAGNOSIS — T889XXA Complication of surgical and medical care, unspecified, initial encounter: Secondary | ICD-10-CM

## 2019-10-31 DIAGNOSIS — Z8249 Family history of ischemic heart disease and other diseases of the circulatory system: Secondary | ICD-10-CM

## 2019-10-31 DIAGNOSIS — E785 Hyperlipidemia, unspecified: Secondary | ICD-10-CM | POA: Diagnosis present

## 2019-10-31 DIAGNOSIS — K611 Rectal abscess: Secondary | ICD-10-CM | POA: Diagnosis present

## 2019-10-31 DIAGNOSIS — F411 Generalized anxiety disorder: Secondary | ICD-10-CM | POA: Diagnosis present

## 2019-10-31 DIAGNOSIS — T8132XA Disruption of internal operation (surgical) wound, not elsewhere classified, initial encounter: Principal | ICD-10-CM | POA: Diagnosis present

## 2019-10-31 DIAGNOSIS — T8143XA Infection following a procedure, organ and space surgical site, initial encounter: Secondary | ICD-10-CM | POA: Diagnosis present

## 2019-10-31 DIAGNOSIS — C20 Malignant neoplasm of rectum: Secondary | ICD-10-CM | POA: Diagnosis present

## 2019-10-31 DIAGNOSIS — G4733 Obstructive sleep apnea (adult) (pediatric): Secondary | ICD-10-CM

## 2019-10-31 DIAGNOSIS — L24A9 Irritant contact dermatitis due friction or contact with other specified body fluids: Secondary | ICD-10-CM

## 2019-10-31 DIAGNOSIS — L271 Localized skin eruption due to drugs and medicaments taken internally: Secondary | ICD-10-CM

## 2019-10-31 DIAGNOSIS — Z9989 Dependence on other enabling machines and devices: Secondary | ICD-10-CM

## 2019-10-31 DIAGNOSIS — B952 Enterococcus as the cause of diseases classified elsewhere: Secondary | ICD-10-CM | POA: Diagnosis present

## 2019-10-31 DIAGNOSIS — Z20822 Contact with and (suspected) exposure to covid-19: Secondary | ICD-10-CM | POA: Diagnosis present

## 2019-10-31 DIAGNOSIS — Z9221 Personal history of antineoplastic chemotherapy: Secondary | ICD-10-CM

## 2019-10-31 DIAGNOSIS — R309 Painful micturition, unspecified: Secondary | ICD-10-CM | POA: Diagnosis not present

## 2019-10-31 DIAGNOSIS — Z87898 Personal history of other specified conditions: Secondary | ICD-10-CM

## 2019-10-31 DIAGNOSIS — Z87891 Personal history of nicotine dependence: Secondary | ICD-10-CM

## 2019-10-31 DIAGNOSIS — D630 Anemia in neoplastic disease: Secondary | ICD-10-CM | POA: Diagnosis present

## 2019-10-31 DIAGNOSIS — Z79899 Other long term (current) drug therapy: Secondary | ICD-10-CM

## 2019-10-31 HISTORY — DX: Other chronic pain: G89.29

## 2019-10-31 LAB — BASIC METABOLIC PANEL
Anion gap: 12 (ref 5–15)
BUN: 11 mg/dL (ref 6–20)
CO2: 24 mmol/L (ref 22–32)
Calcium: 8.5 mg/dL — ABNORMAL LOW (ref 8.9–10.3)
Chloride: 101 mmol/L (ref 98–111)
Creatinine, Ser: 0.77 mg/dL (ref 0.61–1.24)
GFR calc Af Amer: >60 mL/min (ref >60)
GFR calc non Af Amer: >60 mL/min (ref >60)
Glucose, Bld: 102 mg/dL — ABNORMAL HIGH (ref 70–99)
Potassium: 3.8 mmol/L (ref 3.5–5.1)
Sodium: 137 mmol/L (ref 135–145)

## 2019-10-31 LAB — CBC WITH DIFFERENTIAL/PLATELET
Abs Immature Granulocytes: 0.09 10*3/uL — ABNORMAL HIGH (ref 0.00–0.07)
Basophils Absolute: 0 10*3/uL (ref 0.0–0.1)
Basophils Relative: 0 %
Eosinophils Absolute: 0 10*3/uL (ref 0.0–0.5)
Eosinophils Relative: 0 %
HCT: 34.3 % — ABNORMAL LOW (ref 39.0–52.0)
Hemoglobin: 11.3 g/dL — ABNORMAL LOW (ref 13.0–17.0)
Immature Granulocytes: 1 %
Lymphocytes Relative: 4 %
Lymphs Abs: 0.5 10*3/uL — ABNORMAL LOW (ref 0.7–4.0)
MCH: 30.5 pg (ref 26.0–34.0)
MCHC: 32.9 g/dL (ref 30.0–36.0)
MCV: 92.5 fL (ref 80.0–100.0)
Monocytes Absolute: 0.8 10*3/uL (ref 0.1–1.0)
Monocytes Relative: 7 %
Neutro Abs: 10.9 10*3/uL — ABNORMAL HIGH (ref 1.7–7.7)
Neutrophils Relative %: 88 %
Platelets: 738 10*3/uL — ABNORMAL HIGH (ref 150–400)
RBC: 3.71 MIL/uL — ABNORMAL LOW (ref 4.22–5.81)
RDW: 13.2 % (ref 11.5–15.5)
WBC: 12.4 10*3/uL — ABNORMAL HIGH (ref 4.0–10.5)
nRBC: 0 % (ref 0.0–0.2)

## 2019-10-31 MED ORDER — HYDROMORPHONE HCL 1 MG/ML IJ SOLN
1.0000 mg | INTRAMUSCULAR | Status: DC | PRN
  Administered 2019-10-31: 1 mg via INTRAVENOUS
  Filled 2019-10-31 (×2): qty 1

## 2019-10-31 MED ORDER — ONDANSETRON HCL 4 MG/2ML IJ SOLN: 4.0000 mg | Freq: Four times a day (QID) | INTRAMUSCULAR | Status: DC | PRN

## 2019-10-31 MED ORDER — ACETAMINOPHEN 325 MG PO TABS: 650.0000 mg | ORAL_TABLET | Freq: Four times a day (QID) | ORAL | Status: DC | PRN

## 2019-10-31 MED ORDER — SODIUM CHLORIDE (PF) 0.9 % IJ SOLN
INTRAMUSCULAR | Status: AC
  Filled 2019-10-31: qty 50

## 2019-10-31 MED ORDER — ACETAMINOPHEN 650 MG RE SUPP: 650.0000 mg | Freq: Four times a day (QID) | RECTAL | Status: DC | PRN

## 2019-10-31 MED ORDER — HYDROCODONE-ACETAMINOPHEN 5-325 MG PO TABS
1.0000 | ORAL_TABLET | ORAL | Status: DC | PRN
  Administered 2019-10-31 – 2019-11-01 (×2): 2 via ORAL
  Filled 2019-10-31 (×2): qty 2

## 2019-10-31 MED ORDER — DIPHENHYDRAMINE HCL 25 MG PO CAPS: 25.0000 mg | ORAL_CAPSULE | Freq: Four times a day (QID) | ORAL | Status: DC | PRN

## 2019-10-31 MED ORDER — HYDROMORPHONE HCL 1 MG/ML IJ SOLN
1.0000 mg | INTRAMUSCULAR | Status: DC | PRN
  Administered 2019-10-31: 1 mg via INTRAVENOUS
  Administered 2019-11-01 (×2): 2 mg via INTRAVENOUS
  Administered 2019-11-01: 1 mg via INTRAVENOUS
  Administered 2019-11-01: 2 mg via INTRAVENOUS
  Administered 2019-11-01: 1 mg via INTRAVENOUS
  Administered 2019-11-01 – 2019-11-05 (×14): 2 mg via INTRAVENOUS
  Administered 2019-11-05: 1 mg via INTRAVENOUS
  Administered 2019-11-05 – 2019-11-10 (×39): 2 mg via INTRAVENOUS
  Administered 2019-11-10: 1 mg via INTRAVENOUS
  Administered 2019-11-10 – 2019-11-12 (×17): 2 mg via INTRAVENOUS
  Filled 2019-10-31: qty 1
  Filled 2019-10-31 (×28): qty 2
  Filled 2019-10-31: qty 1
  Filled 2019-10-31 (×16): qty 2
  Filled 2019-10-31: qty 1
  Filled 2019-10-31 (×14): qty 2
  Filled 2019-10-31: qty 1
  Filled 2019-10-31 (×11): qty 2
  Filled 2019-10-31: qty 1
  Filled 2019-10-31 (×3): qty 2
  Filled 2019-10-31: qty 1
  Filled 2019-10-31 (×3): qty 2

## 2019-10-31 MED ORDER — IOHEXOL 300 MG/ML  SOLN
100.0000 mL | Freq: Once | INTRAMUSCULAR | Status: AC | PRN
  Administered 2019-10-31: 100 mL via INTRAVENOUS

## 2019-10-31 MED ORDER — PIPERACILLIN-TAZOBACTAM 3.375 G IVPB
3.3750 g | Freq: Three times a day (TID) | INTRAVENOUS | Status: DC
  Administered 2019-10-31 – 2019-11-10 (×29): 3.375 g via INTRAVENOUS
  Filled 2019-10-31 (×32): qty 50

## 2019-10-31 MED ORDER — SODIUM CHLORIDE 0.9 % IV SOLN: INTRAVENOUS | Status: DC

## 2019-10-31 MED ORDER — ONDANSETRON 4 MG PO TBDP: 4.0000 mg | ORAL_TABLET | Freq: Four times a day (QID) | ORAL | Status: DC | PRN

## 2019-10-31 MED ORDER — DIPHENHYDRAMINE HCL 50 MG/ML IJ SOLN: 25.0000 mg | Freq: Four times a day (QID) | INTRAMUSCULAR | Status: DC | PRN

## 2019-10-31 NOTE — H&P (Addendum)
Brandon Barry Documented: 10/31/2019 3:54 PM Location: [facility] Patient #: [id number] DOB: 1976/01/26 Married / Language: English / Race: White Male  History of Present Illness The patient is a 44 year old male who presents with colorectal cancer.44 year old male who presented to the office with a new diagnosis of rectal cancer. He had been having abdominal pain and weight loss for the past couple months. A colonoscopy was performed which showed a mid rectal mass. Biopsies reveal adenocarcinoma with possibility of Lynch syndrome. He has not had a CEA drawn. CT abdomen and pelvis show a rectal mass and a possible liver mass. MRI of the liver is indeterminate hemangioma versus metastatic disease. CT chest was negative. He underwent total neoadjuvant chemotherapy with radiation afterwards. Final Rt was 2/24. His lesion was tattooed.  ______________________________________________________________________________ Patient underwent robotic-assisted low anterior resection on 10/15/19 by Dr. Marcello Barry. Discharged on 10/21/19. He had called the office a few times complaining of abdominal pain over the past week. Yesterday, he called complaining of a sharp shooting abdominal pain, but was not having fevers, nausea, or vomiting. Today, he continued to have abdominal pain and called the office. We decided to order a CT scan to rule out any postop complications, since he is only 2 weeks out from surgery.   Abnormal lab results from today show CBC 12.7, Hgb 11.4, Platelets 830. BMP normal.  Addendum: I received a phone call from Dr. Owens Barry with CT scan results at 3PM: She stated he had an anastomotic breakdown at the rectal anastomosis with extraluminal stool as well as a right lower quadrant abscess with 2 components, measuring 9 cm and 7 cm. I discussed his care with both surgeons in the office, who recommend direct admitting him to the [facility] long service for further evaluation.   I  spoke to his wife, Brandon Barry, at 4 PM to explain the plan. I explained that I cannot tell her what the final treatment plan is, as this will be decided by the surgeons who evaluate him at the hospital. We discussed the possibility of drain placement versus OR. I explained that he should be nothing by mouth, and he will receive IV fluids and IV antibiotics once he is admitted. I spoke to Dr. Dema Barry who is currently the LDOW and Dr. Harlow Barry who was on call tonight.     CLINICAL DATA: Rectal cancer diagnosed in 2020. Chemotherapy and radiation treatment completed. Patient reports a LOWER resection on 10/15/2019. Still having a lot of pain. Difficulty lying on the table for the exam.  EXAM: CT ABDOMEN AND PELVIS WITH CONTRAST  TECHNIQUE: Multidetector CT imaging of the abdomen and pelvis was performed using the standard protocol following bolus administration of intravenous contrast.  CONTRAST: 168mL OMNIPAQUE IOHEXOL 300 MG/ML SOLN  COMPARISON: 08/11/2019  FINDINGS: Lower chest: RIGHT LOWER lobe atelectasis. Heart size is normal.  Hepatobiliary: Stable appearance of 3.5 centimeter hemangioma in the posterior segment of the RIGHT hepatic lobe. An 8 millimeter nodule is identified more inferiorly in the RIGHT hepatic lobe, best seen on image 26 of series 2. The appearance is stable. Gallbladder is present and unremarkable.  Pancreas: Unremarkable. No pancreatic ductal dilatation or surrounding inflammatory changes.  Spleen: Normal in size without focal abnormality.  Adrenals/Urinary Tract: Adrenal glands are normal. No hydronephrosis. Ureters are unremarkable. Urinary bladder wall is thickened. There is air within the lumen of the urinary bladder consistent with infectious process or recent instrumentation.  Stomach/Bowel: The stomach is normal in appearance. Proximal jejunum is  unremarkable. There is dilatation and thickening of the mid small bowel. The distal small bowel  loops show thickened wall but no dilatation. There is an abscess in the RIGHT LOWER QUADRANT, just inferior to the cecum. Abscess has 2 components, measuring 9.1 x 5.1 and 6.9 x 5.0 centimeters. Small amount of air is identified within this abscess.  The rectal anastomosis is surrounded by loculated stool and air, consistent with anastomotic breakdown. There is rectal wall thickening. A tubular gas filled structure extends from the region of the anastomosis into LEFT LOWER QUADRANT and is favored to represent air within a surgical drain site.  Vascular/Lymphatic: No significant vascular findings are present. No enlarged abdominal or pelvic lymph nodes.  Reproductive: Prostate is unremarkable.  Other: Anterior abdominal wall unremarkable.  Musculoskeletal: No acute or significant osseous findings.  IMPRESSION: 1. Anastomotic breakdown at the rectal anastomosis. Extraluminal stool is present in the region of the anastomosis. 2. Abscess in the RIGHT LOWER QUADRANT measuring 9.1 x 5.1 and 6.9 centimeters. 3. Small bowel obstruction. 4. Air within the lumen of the urinary bladder consistent with infectious process or recent instrumentation. 5. Stable appearance of hepatic hemangioma. 6. Stable appearance of subcentimeter nodule in the RIGHT hepatic lobe, likely benign.  Critical Value/emergent results were called by telephone at the time of interpretation on 10/31/2019 at 3:18 pm to provider Brandon Barry , who verbally acknowledged these results.   Electronically Signed By: Brandon Barry M.D. On: 10/31/2019 15:52    Assessment & Plan INTRA-ABDOMINAL ABSCESS (K65.1) Two weeks s/p LAR for rectal cancer with abdominal pain.  Per CT scan results, he has a right lower quadrant abscess as well as anastomotic breakdown at the rectal anastomosis. We will be direct admitting him to [facility]. Patient's wife and providers are notified.  Brandon Barry, [facility]  Surgery

## 2019-10-31 NOTE — H&P (Signed)
Brandon Barry is an 44 y.o. male.    Chief Complaint: abdominal pain after LAR  HPI: Patient is a 44 year old male who underwent robotic low anterior resection on Oct 15, 8142 by Dr. Leighton Ruff for an adenocarcinoma of the rectum.  Patient was discharged home on Oct 21, 2019.  Since discharge the patient has had persistent lower abdominal pain.  Patient denies fevers or chills.  He denies sweats.  Patient contacted the office again this morning.  CT scan of the abdomen and pelvis was ordered.  This demonstrated evidence of anastomotic leakage with a collection in the pelvis.  Patient was admitted to the surgical service and started on intravenous antibiotics.  Past Medical History:  Diagnosis Date  . Hyperlipidemia   . OSA on CPAP   . Peripheral neuropathy    Hands and feet from Chemo  . Rectal adenocarcinoma ([place])   . Right shoulder pain     Past Surgical History:  Procedure Laterality Date  . COLONOSCOPY  02/04/2019  . KNEE ARTHROSCOPY Left 11/19/2018  . PORT-A-CATH REMOVAL N/A 10/15/2019   Procedure: PORT REMOVAL;  Surgeon: Leighton Ruff, MD;  Location: [facility];  Service: General;  Laterality: N/A;  . PORTACATH PLACEMENT Left 02/21/2019   Procedure: INSERTION PORT-A-CATH;  Surgeon: Johnathan Hausen, MD;  Location: [facility];  Service: General;  Laterality: Left;  . WISDOM TOOTH EXTRACTION    . XI ROBOTIC ASSISTED LOWER ANTERIOR RESECTION N/A 10/15/2019   Procedure: XI ROBOTIC ASSISTED LOWER ANTERIOR RESECTION, RIGID PROCTOSCOPY;  Surgeon: Leighton Ruff, MD;  Location: [facility];  Service: General;  Laterality: N/A;    Family History  Problem Relation Age of Onset  . Heart attack Father   . Cancer Paternal Grandmother   . Cancer Paternal Grandfather    Social History:  reports that he has quit smoking. His smoking use included cigarettes. He has never used smokeless tobacco. He reports current alcohol use. He reports that he does not use drugs.  Allergies: No Known  Allergies  Medications Prior to Admission  Medication Sig Dispense Refill  . acetaminophen (TYLENOL) 500 MG tablet Take 500 mg by mouth every 6 (six) hours as needed for moderate pain.    Marland Kitchen gabapentin (NEURONTIN) 300 MG capsule Take 2 capsules (600 mg total) by mouth 2 (two) times daily. (Patient taking differently: Take 600 mg by mouth 2 (two) times daily as needed (pain). ) 120 capsule 1  . ibuprofen (ADVIL) 200 MG tablet Take 400 mg by mouth every 6 (six) hours as needed for moderate pain.    Marland Kitchen LORazepam (ATIVAN) 0.5 MG tablet Take 1 tablet (0.5 mg total) by mouth every 8 (eight) hours as needed for anxiety. Or nausea. Do not drive while taking 30 tablet 0  . oxyCODONE (OXY IR/ROXICODONE) 5 MG immediate release tablet Take 1-2 tablets (5-10 mg total) by mouth every 6 (six) hours as needed for moderate pain, severe pain or breakthrough pain. 30 tablet 0  . lidocaine-prilocaine (EMLA) cream Apply 1 application topically as directed. Apply 1 hour prior to stick and cover with plastic wrap (Patient not taking: Reported on 09/25/2019) 30 g 5    Results for orders placed or performed during the hospital encounter of 10/31/19 (from the past 48 hour(s))  Basic metabolic panel     Status: Abnormal   Collection Time: 10/31/19  5:50 PM  Result Value Ref Range   Sodium 137 135 - 145 mmol/L   Potassium 3.8 3.5 - 5.1 mmol/L  Chloride 101 98 - 111 mmol/L   CO2 24 22 - 32 mmol/L   Glucose, Bld 102 (H) 70 - 99 mg/dL    Comment: Glucose reference range applies only to samples taken after fasting for at least 8 hours.   BUN 11 6 - 20 mg/dL   Creatinine, Ser 0.77 0.61 - 1.24 mg/dL   Calcium 8.5 (L) 8.9 - 10.3 mg/dL   GFR calc non Af Amer >60 >60 mL/min   GFR calc Af Amer >60 >60 mL/min   Anion gap 12 5 - 15    Comment: Performed at [facility], [place] [address], [place], [place] [address]  CBC WITH DIFFERENTIAL     Status: Abnormal   Collection Time: 10/31/19  5:50 PM  Result Value  Ref Range   WBC 12.4 (H) 4.0 - 10.5 K/uL   RBC 3.71 (L) 4.22 - 5.81 MIL/uL   Hemoglobin 11.3 (L) 13.0 - 17.0 g/dL   HCT 34.3 (L) 39.0 - 52.0 %   MCV 92.5 80.0 - 100.0 fL   MCH 30.5 26.0 - 34.0 pg   MCHC 32.9 30.0 - 36.0 g/dL   RDW 13.2 11.5 - 15.5 %   Platelets 738 (H) 150 - 400 K/uL   nRBC 0.0 0.0 - 0.2 %   Neutrophils Relative % 88 %   Neutro Abs 10.9 (H) 1.7 - 7.7 K/uL   Lymphocytes Relative 4 %   Lymphs Abs 0.5 (L) 0.7 - 4.0 K/uL   Monocytes Relative 7 %   Monocytes Absolute 0.8 0.1 - 1.0 K/uL   Eosinophils Relative 0 %   Eosinophils Absolute 0.0 0.0 - 0.5 K/uL   Basophils Relative 0 %   Basophils Absolute 0.0 0.0 - 0.1 K/uL   Immature Granulocytes 1 %   Abs Immature Granulocytes 0.09 (H) 0.00 - 0.07 K/uL    Comment: Performed at [facility], [place] [address] [place], [place] [address]   CT ABDOMEN PELVIS W CONTRAST  Result Date: 10/31/2019 CLINICAL DATA:  Rectal cancer diagnosed in 2020. Chemotherapy and radiation treatment completed. Patient reports a LOWER resection on 10/15/2019. Still having a lot of pain. Difficulty lying on the table for the exam. EXAM: CT ABDOMEN AND PELVIS WITH CONTRAST TECHNIQUE: Multidetector CT imaging of the abdomen and pelvis was performed using the standard protocol following bolus administration of intravenous contrast. CONTRAST:  157m OMNIPAQUE IOHEXOL 300 MG/ML  SOLN COMPARISON:  08/11/2019 FINDINGS: Lower chest: RIGHT LOWER lobe atelectasis. Heart size is normal. Hepatobiliary: Stable appearance of 3.5 centimeter hemangioma in the posterior segment of the RIGHT hepatic lobe. An 8 millimeter nodule is identified more inferiorly in the RIGHT hepatic lobe, best seen on image 26 of series 2. The appearance is stable. Gallbladder is present and unremarkable. Pancreas: Unremarkable. No pancreatic ductal dilatation or surrounding inflammatory changes. Spleen: Normal in size without focal abnormality. Adrenals/Urinary Tract: Adrenal  glands are normal. No hydronephrosis. Ureters are unremarkable. Urinary bladder wall is thickened. There is air within the lumen of the urinary bladder consistent with infectious process or recent instrumentation. Stomach/Bowel: The stomach is normal in appearance. Proximal jejunum is unremarkable. There is dilatation and thickening of the mid small bowel. The distal small bowel loops show thickened wall but no dilatation. There is an abscess in the RIGHT LOWER QUADRANT, just inferior to the cecum. Abscess has 2 components, measuring 9.1 x 5.1 and 6.9 x 5.0 centimeters. Small amount of air is identified within this abscess. The rectal anastomosis is surrounded  by loculated stool and air, consistent with anastomotic breakdown. There is rectal wall thickening. A tubular gas filled structure extends from the region of the anastomosis into LEFT LOWER QUADRANT and is favored to represent air within a surgical drain site. Vascular/Lymphatic: No significant vascular findings are present. No enlarged abdominal or pelvic lymph nodes. Reproductive: Prostate is unremarkable. Other: Anterior abdominal wall unremarkable. Musculoskeletal: No acute or significant osseous findings. IMPRESSION: 1. Anastomotic breakdown at the rectal anastomosis. Extraluminal stool is present in the region of the anastomosis. 2. Abscess in the RIGHT LOWER QUADRANT measuring 9.1 x 5.1 and 6.9 centimeters. 3. Small bowel obstruction. 4. Air within the lumen of the urinary bladder consistent with infectious process or recent instrumentation. 5. Stable appearance of hepatic hemangioma. 6. Stable appearance of subcentimeter nodule in the RIGHT hepatic lobe, likely benign. Critical Value/emergent results were called by telephone at the time of interpretation on 10/31/2019 at 3:18 pm to provider Carlena Hurl , who verbally acknowledged these results. Electronically Signed   By: Nolon Nations M.D.   On: 10/31/2019 15:52    Blood pressure 128/85, pulse 64,  temperature 98.3 F (36.8 C), temperature source Oral, resp. rate 17, height 6' (1.829 m), weight 72.6 kg, SpO2 100 %.  CONSTITUTIONAL: NAD; conversant; no obvious deformities EYES:   Moist conjunctiva; no lid lag; anicteric; PERRL NECK:   Trachea midline; no thyromegaly LUNGS:  Normal respiratory effort; no wheeze; no rales; no tactile fremitus CV:   RRR; no palpable thrills; no pitting edema GI:    Abdomen has mild distention; few bowel sounds are present; surgical incisions are healing uneventfully without sign of herniation or infection; mild to moderate tenderness lower abdominal MSK:    Normal range of motion of extremities; no clubbing; no cyanosis PSYCHIATRIC:  Appropriate affect for situation; alert and oriented X 3 LYMPHATIC:   No palpable cervical lymphadenopathy; no palpable axillary adenopathy   Assessment/Plan Adenocarcinoma of the rectum, status post robotic low anterior resection Rule out anastomotic disruption, pelvic abscess  IV Zosyn  IV pain Rx  IV hydration  Allow ice chips, otherwise NPO  I have reviewed the patient's CT scan of the abdomen and pelvis done earlier today.  I have discussed this with Dr. Annye English.  I have reviewed the patient's laboratories.  Patient has been admitted to the surgical service and started on intravenous antibiotics.  He is receiving intravenous hydration.  I met with the patient and his wife at the bedside.  I explained to him that we are concerned from the CT scan that there may be leakage at the anastomosis.  It is difficult to tell from the CT scan how extensive the disruption may be.  We discussed potential methods of management including percutaneous drainage by interventional radiology, return to the operating room for laparoscopy and possible diverting loop ileostomy, and possible open abdominal surgery with takedown of the anastomosis and colostomy placement.  At this time the patient is stable.  I would like to review the CT scan  in the morning with Dr. Neysa Bonito from our colorectal group.  We will also review the study with interventional radiology.  We will then talk to the patient and his wife regarding plans for further management.  The patient and his wife are in agreement with this plan.  Armandina Gemma, MD [facility] Office: [place], MD 10/31/2019, 7:59 PM

## 2019-10-31 NOTE — Plan of Care (Signed)
  Problem: Education: Goal: Knowledge of General Education information will improve Description Including pain rating scale, medication(s)/side effects and non-pharmacologic comfort measures Outcome: Progressing   

## 2019-11-01 ENCOUNTER — Encounter (HOSPITAL_COMMUNITY): Payer: Self-pay

## 2019-11-01 ENCOUNTER — Observation Stay (HOSPITAL_COMMUNITY): Payer: Commercial Managed Care - PPO

## 2019-11-01 DIAGNOSIS — T8132XA Disruption of internal operation (surgical) wound, not elsewhere classified, initial encounter: Secondary | ICD-10-CM | POA: Diagnosis present

## 2019-11-01 DIAGNOSIS — G4733 Obstructive sleep apnea (adult) (pediatric): Secondary | ICD-10-CM | POA: Diagnosis present

## 2019-11-01 DIAGNOSIS — E785 Hyperlipidemia, unspecified: Secondary | ICD-10-CM | POA: Diagnosis present

## 2019-11-01 DIAGNOSIS — G629 Polyneuropathy, unspecified: Secondary | ICD-10-CM | POA: Diagnosis present

## 2019-11-01 DIAGNOSIS — Z923 Personal history of irradiation: Secondary | ICD-10-CM | POA: Diagnosis not present

## 2019-11-01 DIAGNOSIS — Z87891 Personal history of nicotine dependence: Secondary | ICD-10-CM | POA: Diagnosis not present

## 2019-11-01 DIAGNOSIS — M549 Dorsalgia, unspecified: Secondary | ICD-10-CM | POA: Diagnosis present

## 2019-11-01 DIAGNOSIS — R309 Painful micturition, unspecified: Secondary | ICD-10-CM | POA: Diagnosis not present

## 2019-11-01 DIAGNOSIS — M25511 Pain in right shoulder: Secondary | ICD-10-CM | POA: Diagnosis present

## 2019-11-01 DIAGNOSIS — E860 Dehydration: Secondary | ICD-10-CM | POA: Diagnosis present

## 2019-11-01 DIAGNOSIS — C20 Malignant neoplasm of rectum: Secondary | ICD-10-CM | POA: Diagnosis present

## 2019-11-01 DIAGNOSIS — Z20822 Contact with and (suspected) exposure to covid-19: Secondary | ICD-10-CM | POA: Diagnosis present

## 2019-11-01 DIAGNOSIS — K651 Peritoneal abscess: Secondary | ICD-10-CM | POA: Diagnosis present

## 2019-11-01 DIAGNOSIS — G8929 Other chronic pain: Secondary | ICD-10-CM | POA: Diagnosis present

## 2019-11-01 DIAGNOSIS — Z79899 Other long term (current) drug therapy: Secondary | ICD-10-CM | POA: Diagnosis not present

## 2019-11-01 DIAGNOSIS — Z8249 Family history of ischemic heart disease and other diseases of the circulatory system: Secondary | ICD-10-CM | POA: Diagnosis not present

## 2019-11-01 DIAGNOSIS — K9189 Other postprocedural complications and disorders of digestive system: Secondary | ICD-10-CM | POA: Diagnosis present

## 2019-11-01 DIAGNOSIS — Y832 Surgical operation with anastomosis, bypass or graft as the cause of abnormal reaction of the patient, or of later complication, without mention of misadventure at the time of the procedure: Secondary | ICD-10-CM | POA: Diagnosis present

## 2019-11-01 DIAGNOSIS — T8143XA Infection following a procedure, organ and space surgical site, initial encounter: Secondary | ICD-10-CM | POA: Diagnosis present

## 2019-11-01 DIAGNOSIS — D630 Anemia in neoplastic disease: Secondary | ICD-10-CM | POA: Diagnosis present

## 2019-11-01 DIAGNOSIS — K611 Rectal abscess: Secondary | ICD-10-CM | POA: Diagnosis present

## 2019-11-01 DIAGNOSIS — Z9221 Personal history of antineoplastic chemotherapy: Secondary | ICD-10-CM | POA: Diagnosis not present

## 2019-11-01 DIAGNOSIS — Z9989 Dependence on other enabling machines and devices: Secondary | ICD-10-CM

## 2019-11-01 DIAGNOSIS — B952 Enterococcus as the cause of diseases classified elsewhere: Secondary | ICD-10-CM | POA: Diagnosis present

## 2019-11-01 DIAGNOSIS — F411 Generalized anxiety disorder: Secondary | ICD-10-CM | POA: Diagnosis present

## 2019-11-01 LAB — PROTIME-INR
INR: 1.2 (ref 0.8–1.2)
Prothrombin Time: 14.9 seconds (ref 11.4–15.2)

## 2019-11-01 MED ORDER — DEXTROSE IN LACTATED RINGERS 5 % IV SOLN: INTRAVENOUS | Status: DC

## 2019-11-01 MED ORDER — CHLORHEXIDINE GLUCONATE CLOTH 2 % EX PADS
6.0000 | MEDICATED_PAD | Freq: Every day | CUTANEOUS | Status: DC
  Administered 2019-11-01 – 2019-12-01 (×27): 6 via TOPICAL

## 2019-11-01 MED ORDER — FENTANYL CITRATE (PF) 100 MCG/2ML IJ SOLN
INTRAMUSCULAR | Status: AC
  Filled 2019-11-01: qty 4

## 2019-11-01 MED ORDER — DIPHENHYDRAMINE HCL 50 MG/ML IJ SOLN
12.5000 mg | Freq: Once | INTRAMUSCULAR | Status: AC
  Administered 2019-11-01: 12.5 mg via INTRAVENOUS
  Filled 2019-11-01: qty 1

## 2019-11-01 MED ORDER — PROCHLORPERAZINE EDISYLATE 10 MG/2ML IJ SOLN
5.0000 mg | INTRAMUSCULAR | Status: DC | PRN
  Filled 2019-11-01: qty 2

## 2019-11-01 MED ORDER — SODIUM CHLORIDE 0.9% FLUSH
5.0000 mL | Freq: Three times a day (TID) | INTRAVENOUS | Status: DC
  Administered 2019-11-01 – 2019-12-01 (×63): 5 mL

## 2019-11-01 MED ORDER — MIDAZOLAM HCL 2 MG/2ML IJ SOLN
INTRAMUSCULAR | Status: AC
  Filled 2019-11-01: qty 2

## 2019-11-01 MED ORDER — ALUM & MAG HYDROXIDE-SIMETH 200-200-20 MG/5ML PO SUSP
30.0000 mL | Freq: Four times a day (QID) | ORAL | Status: DC | PRN
  Administered 2019-11-16: 30 mL via ORAL
  Filled 2019-11-01: qty 30

## 2019-11-01 MED ORDER — GABAPENTIN 300 MG PO CAPS
600.0000 mg | ORAL_CAPSULE | Freq: Three times a day (TID) | ORAL | Status: DC
  Administered 2019-11-01 – 2019-11-22 (×64): 600 mg via ORAL
  Filled 2019-11-01 (×64): qty 2

## 2019-11-01 MED ORDER — METOPROLOL TARTRATE 5 MG/5ML IV SOLN: 5.0000 mg | Freq: Four times a day (QID) | INTRAVENOUS | Status: DC | PRN

## 2019-11-01 MED ORDER — LORAZEPAM 2 MG/ML IJ SOLN
0.5000 mg | Freq: Three times a day (TID) | INTRAMUSCULAR | Status: DC | PRN
  Administered 2019-11-01 – 2019-11-03 (×4): 1 mg via INTRAVENOUS
  Filled 2019-11-01 (×4): qty 1

## 2019-11-01 MED ORDER — ONDANSETRON HCL 4 MG/2ML IJ SOLN: 4.0000 mg | Freq: Four times a day (QID) | INTRAMUSCULAR | Status: DC | PRN

## 2019-11-01 MED ORDER — GUAIFENESIN-DM 100-10 MG/5ML PO SYRP: 10.0000 mL | ORAL_SOLUTION | ORAL | Status: DC | PRN

## 2019-11-01 MED ORDER — SODIUM CHLORIDE 0.9 % IV SOLN
8.0000 mg | Freq: Four times a day (QID) | INTRAVENOUS | Status: DC | PRN
  Filled 2019-11-01: qty 4

## 2019-11-01 MED ORDER — FENTANYL CITRATE (PF) 100 MCG/2ML IJ SOLN
INTRAMUSCULAR | Status: AC
  Filled 2019-11-01: qty 2

## 2019-11-01 MED ORDER — METHOCARBAMOL 1000 MG/10ML IJ SOLN
1000.0000 mg | Freq: Four times a day (QID) | INTRAVENOUS | Status: DC | PRN
  Filled 2019-11-01: qty 10

## 2019-11-01 MED ORDER — HYDROCORTISONE (PERIANAL) 2.5 % EX CREA: 1.0000 "application " | TOPICAL_CREAM | Freq: Four times a day (QID) | CUTANEOUS | Status: DC | PRN

## 2019-11-01 MED ORDER — ENOXAPARIN SODIUM 40 MG/0.4ML ~~LOC~~ SOLN
40.0000 mg | SUBCUTANEOUS | Status: DC
  Administered 2019-11-02 – 2019-12-02 (×30): 40 mg via SUBCUTANEOUS
  Filled 2019-11-01 (×29): qty 0.4

## 2019-11-01 MED ORDER — LACTATED RINGERS IV BOLUS: 1000.0000 mL | Freq: Three times a day (TID) | INTRAVENOUS | Status: DC | PRN

## 2019-11-01 MED ORDER — PHENOL 1.4 % MT LIQD: 1.0000 | OROMUCOSAL | Status: DC | PRN

## 2019-11-01 MED ORDER — MIDAZOLAM HCL 2 MG/2ML IJ SOLN
INTRAMUSCULAR | Status: DC | PRN
  Administered 2019-11-01 (×2): 1 mg via INTRAVENOUS
  Administered 2019-11-01: 1.5 mg via INTRAVENOUS
  Administered 2019-11-01: 1 mg via INTRAVENOUS
  Administered 2019-11-01: 0.5 mg via INTRAVENOUS

## 2019-11-01 MED ORDER — MENTHOL 3 MG MT LOZG
1.0000 | LOZENGE | OROMUCOSAL | Status: DC | PRN
  Filled 2019-11-01: qty 9

## 2019-11-01 MED ORDER — ENALAPRILAT 1.25 MG/ML IV SOLN
0.6250 mg | Freq: Four times a day (QID) | INTRAVENOUS | Status: DC | PRN
  Filled 2019-11-01: qty 1

## 2019-11-01 MED ORDER — ONDANSETRON HCL 4 MG/2ML IJ SOLN
4.0000 mg | Freq: Three times a day (TID) | INTRAMUSCULAR | Status: AC
  Administered 2019-11-01 (×3): 4 mg via INTRAVENOUS
  Filled 2019-11-01 (×3): qty 2

## 2019-11-01 MED ORDER — ALUM & MAG HYDROXIDE-SIMETH 200-200-20 MG/5ML PO SUSP: 30.0000 mL | Freq: Four times a day (QID) | ORAL | Status: DC | PRN

## 2019-11-01 MED ORDER — METOPROLOL TARTRATE 12.5 MG HALF TABLET
12.5000 mg | ORAL_TABLET | Freq: Two times a day (BID) | ORAL | Status: DC
  Administered 2019-11-01 – 2019-12-03 (×56): 12.5 mg via ORAL
  Filled 2019-11-01 (×63): qty 1

## 2019-11-01 MED ORDER — SIMETHICONE 40 MG/0.6ML PO SUSP
40.0000 mg | Freq: Four times a day (QID) | ORAL | Status: DC | PRN
  Filled 2019-11-01: qty 0.6

## 2019-11-01 MED ORDER — FENTANYL CITRATE (PF) 100 MCG/2ML IJ SOLN
INTRAMUSCULAR | Status: DC | PRN
  Administered 2019-11-01 (×2): 50 ug via INTRAVENOUS
  Administered 2019-11-01: 75 ug via INTRAVENOUS
  Administered 2019-11-01: 25 ug via INTRAVENOUS
  Administered 2019-11-01: 50 ug via INTRAVENOUS

## 2019-11-01 MED ORDER — KETOROLAC TROMETHAMINE 15 MG/ML IJ SOLN
15.0000 mg | Freq: Three times a day (TID) | INTRAMUSCULAR | Status: AC
  Administered 2019-11-01 – 2019-11-03 (×6): 15 mg via INTRAVENOUS
  Filled 2019-11-01 (×6): qty 1

## 2019-11-01 MED ORDER — LACTATED RINGERS IV BOLUS: 1000.0000 mL | Freq: Three times a day (TID) | INTRAVENOUS | Status: AC | PRN

## 2019-11-01 MED ORDER — LIP MEDEX EX OINT
1.0000 "application " | TOPICAL_OINTMENT | Freq: Two times a day (BID) | CUTANEOUS | Status: DC
  Administered 2019-11-01 – 2019-12-02 (×54): 1 via TOPICAL
  Filled 2019-11-01 (×6): qty 7

## 2019-11-01 MED ORDER — HYDROCORTISONE 1 % EX CREA: 1.0000 "application " | TOPICAL_CREAM | Freq: Three times a day (TID) | CUTANEOUS | Status: DC | PRN

## 2019-11-01 MED ORDER — DIPHENHYDRAMINE HCL 50 MG/ML IJ SOLN
12.5000 mg | Freq: Four times a day (QID) | INTRAMUSCULAR | Status: DC | PRN
  Administered 2019-11-24 – 2019-11-30 (×3): 25 mg via INTRAVENOUS
  Administered 2019-11-30: 12.5 mg via INTRAVENOUS
  Administered 2019-12-01: 25 mg via INTRAVENOUS
  Filled 2019-11-01 (×5): qty 1

## 2019-11-01 MED ORDER — MIDAZOLAM HCL 2 MG/2ML IJ SOLN
INTRAMUSCULAR | Status: AC
  Filled 2019-11-01: qty 4

## 2019-11-01 MED ORDER — METHOCARBAMOL 1000 MG/10ML IJ SOLN
1000.0000 mg | Freq: Three times a day (TID) | INTRAVENOUS | Status: AC
  Administered 2019-11-01 (×3): 1000 mg via INTRAVENOUS
  Filled 2019-11-01: qty 10
  Filled 2019-11-01 (×3): qty 1000

## 2019-11-01 MED ORDER — MAGIC MOUTHWASH
15.0000 mL | Freq: Four times a day (QID) | ORAL | Status: DC | PRN
  Filled 2019-11-01: qty 15

## 2019-11-01 NOTE — Procedures (Signed)
Interventional Radiology Procedure:   Indications: Post operative abscesses, concern for anastomosis leak  Procedure: CT guided drain placement x 3.  2 drains in RLQ abscesses.  1 drain in presacral abscess  Findings: Approximately 75 ml of thick yellow fluid removed from RLQ drains.   20 ml of feces/purulent fluid removed from presacral pelvic abscess.  Complications: None     EBL: less than 20 ml  Plan: Send sample of RLQ fluid for culture.  Flushes drains regularly and follow output.     Brandon Barry R. Anselm Pancoast, MD  Pager: [phone]

## 2019-11-01 NOTE — Sedation Documentation (Signed)
Patient is being repositioned to lateral side for site of the 3rd drain at his backside.

## 2019-11-01 NOTE — Progress Notes (Signed)
Brandon Barry [id number] Nov 11, 1975  CARE TEAM:  PCP: [facility] [place] Team: Patient Care Team: [facility] [place] as PCP - General (Internal Medicine)  Inpatient Treatment Team: Treatment Team: Attending Provider: Edison Pace Md, MD; Registered Nurse: Lyndal Pulley, RN; Technician: Leda Quail, NT; Registered Nurse: Sibyl Parr, RN; Registered Nurse: Charlyne Petrin, RN; Technician: Lewanda Rife, NT; Consulting Physician: Markus Daft, MD; Consulting Physician: Leighton Ruff, MD   Problem List:   Principal Problem:   Intraabdominal abscess Active Problems:   Cancer of rectum  ypT3, ypN0 s/p TNT & robotic LAR 10/15/2019   Anxiety state   OSA on CPAP   Peripheral neuropathy   Hyperlipidemia   Chronic back pain greater than 3 months duration   Right shoulder pain      * No surgery found *      Assessment  Struggling with pain and cramping  Gas and fluid in pelvis suspicious for abscess/delayed anastomotic leak  [facility] Stay = 0 days)  Plan:  We will work to try and get things under better control since he is clearly miserable.  Improve pain control.  Discussed with nurse, Manuela Schwartz.  She is giving him more IV Dilaudid now.   Resume chronic pain medicines as tolerated.  Placed on scheduled Robaxin.  Ice/heat.  Gabapentin 3 times daily.  Narcotics for backup  Anxiolysis.  Benadryl for now.  Can offer lorazepam at some point as well.  IV antibiotics.  Continue Zosyn for now.  Percutaneous drainage of gas in abscess pocket by interventional radiology.  I reviewed the films with Dr. Harlow Asa.  We called discussed with interventional radiology, Dr. Anselm Pancoast.  Hopefully we can get in a drain in today perhaps or a transgluteal window versus right lower quadrant if that truly is an abscess pocket.  See if we can calm this down with bowel rest and antibiotics.  If does not improve, may require operative exploration with  fecal diversion.  Most likely laparoscopic washout with diverting loop ileostomy to avoid takedown of the low anastomosis.  Patient very anxious and uncomfortable.  Somewhat consolable.  Wife at bedside tearful and stressed.  I tried to let them vent but needed to interrupt to help explain the plan numerous times.  That seemed to know his wife at first but eventually after explaining it numerous times they seemed to calm down.  We will try drain placement and antibiotics and bowel rest first.  If worsens or does not improve, perhaps operative exploration and loop ileostomy diversion.  He is not in septic shock at this moment, so I do not feel pressed to do immediate surgery right now.  It has been more than a few weeks, so hopefully this is somewhat walled off and will improve with drainage and antibiotics.    May require TNA if this does not turn around quickly.  We will see.  VTE prophylaxis- SCDs, etc  Mobilize as tolerated to help recovery  50 minutes spent in review, evaluation, examination, counseling, and coordination of care.  More than 50% of that time was spent in counseling.  11/01/2019    Subjective: (Chief complaint)  Patient restless and unconfortable in bed.  Wife anxious and tearful at bedside.  Frustrated.  Some pain control with IV medication but wears off quickly.  Mild nausea but no emesis.  No flatus.  Patient and wife related frustrations and concerns.  Patient notes he had some yellow cheesy drainage from his old drain  site for a few days but not so much the past day.  Feeling crampy and sore and miserable.  Objective:  Vital signs:  Vitals:   10/31/19 1722 10/31/19 2038 11/01/19 0117 11/01/19 0644  BP: 128/85 118/80 121/75 134/82  Pulse: 64 60 67 71  Resp: 17 18 18 18   Temp: 98.3 F (36.8 C) 98.6 F (37 C) 98.8 F (37.1 C) 99 F (37.2 C)  TempSrc: Oral Oral Oral Oral  SpO2: 100% 97% 97% 98%  Weight: 72.6 kg     Height: 6' (1.829 m)       Last  BM Date: 10/31/19  Intake/Output   Yesterday:  05/21 0701 - 05/22 0700 In: 1204.5 [I.V.:780.8; IV Piggyback:423.7] Out: 0  This shift:  No intake/output data recorded.  Bowel function:  Flatus: No  BM:  No  Drain: (No drain)   Physical Exam:  General: Pt awake/alert in moderate acute distress Eyes: PERRL, normal EOM.  Sclera clear.  No icterus Neuro: CN II-XII intact w/o focal sensory/motor deficits. Lymph: No head/neck/groin lymphadenopathy Psych:  No delerium/psychosis/paranoia.  Oriented x 4 HENT: Normocephalic, Mucus membranes moist.  No thrush Neck: Supple, No tracheal deviation.  No obvious thyromegaly Chest: No pain to chest wall compression.  Good respiratory excursion.  No audible wheezing CV:  Pulses intact.  Regular rhythm.  No major extremity edema MS: Normal AROM mjr joints.  No obvious deformity  Abdomen: Somewhat firm.  Moderately distended.  Tenderness at RLQ at old drain site - no active drainage.  No evidence of peritonitis.  No incarcerated hernias.  Ext:  No deformity.  No mjr edema.  No cyanosis Skin: No petechiae / purpurea.  No major sores.  Warm and dry    Results:   Cultures: Recent Results (from the past 720 hour(s))  SARS CORONAVIRUS 2 (TAT 6-24 HRS) Nasopharyngeal Nasopharyngeal Swab     Status: None   Collection Time: 10/11/19  1:49 PM   Specimen: Nasopharyngeal Swab  Result Value Ref Range Status   SARS Coronavirus 2 NEGATIVE NEGATIVE Final    Comment: (NOTE) SARS-CoV-2 target nucleic acids are NOT DETECTED. The SARS-CoV-2 RNA is generally detectable in upper and lower respiratory specimens during the acute phase of infection. Negative results do not preclude SARS-CoV-2 infection, do not rule out co-infections with other pathogens, and should not be used as the sole basis for treatment or other patient management decisions. Negative results must be combined with clinical observations, patient history, and epidemiological information.  The expected result is Negative. Fact Sheet for Patients: SugarRoll.be Fact Sheet for Healthcare Providers: https://www.woods-mathews.com/ This test is not yet approved or cleared by the [place] FDA and  has been authorized for detection and/or diagnosis of SARS-CoV-2 by FDA under an Emergency Use Authorization (EUA). This EUA will remain  in effect (meaning this test can be used) for the duration of the COVID-19 declaration under Section 56 4(b)(1) of the Act, 21 U.S.C. section 360bbb-3(b)(1), unless the authorization is terminated or revoked sooner. Performed at [facility], [place] [address], [place], [place] [address]     Labs: Results for orders placed or performed during the hospital encounter of 10/31/19 (from the past 48 hour(s))  Basic metabolic panel     Status: Abnormal   Collection Time: 10/31/19  5:50 PM  Result Value Ref Range   Sodium 137 135 - 145 mmol/L   Potassium 3.8 3.5 - 5.1 mmol/L   Chloride 101 98 - 111 mmol/L   CO2 24  22 - 32 mmol/L   Glucose, Bld 102 (H) 70 - 99 mg/dL    Comment: Glucose reference range applies only to samples taken after fasting for at least 8 hours.   BUN 11 6 - 20 mg/dL   Creatinine, Ser 0.77 0.61 - 1.24 mg/dL   Calcium 8.5 (L) 8.9 - 10.3 mg/dL   GFR calc non Af Amer >60 >60 mL/min   GFR calc Af Amer >60 >60 mL/min   Anion gap 12 5 - 15    Comment: Performed at [facility], [place] [address] [place], [place] [address]  CBC WITH DIFFERENTIAL     Status: Abnormal   Collection Time: 10/31/19  5:50 PM  Result Value Ref Range   WBC 12.4 (H) 4.0 - 10.5 K/uL   RBC 3.71 (L) 4.22 - 5.81 MIL/uL   Hemoglobin 11.3 (L) 13.0 - 17.0 g/dL   HCT 34.3 (L) 39.0 - 52.0 %   MCV 92.5 80.0 - 100.0 fL   MCH 30.5 26.0 - 34.0 pg   MCHC 32.9 30.0 - 36.0 g/dL   RDW 13.2 11.5 - 15.5 %   Platelets 738 (H) 150 - 400 K/uL   nRBC 0.0 0.0 - 0.2 %   Neutrophils Relative % 88 %   Neutro Abs  10.9 (H) 1.7 - 7.7 K/uL   Lymphocytes Relative 4 %   Lymphs Abs 0.5 (L) 0.7 - 4.0 K/uL   Monocytes Relative 7 %   Monocytes Absolute 0.8 0.1 - 1.0 K/uL   Eosinophils Relative 0 %   Eosinophils Absolute 0.0 0.0 - 0.5 K/uL   Basophils Relative 0 %   Basophils Absolute 0.0 0.0 - 0.1 K/uL   Immature Granulocytes 1 %   Abs Immature Granulocytes 0.09 (H) 0.00 - 0.07 K/uL    Comment: Performed at [facility], [place] [address] [place], [place] [address]  Protime-INR     Status: None   Collection Time: 11/01/19  7:31 AM  Result Value Ref Range   Prothrombin Time 14.9 11.4 - 15.2 seconds   INR 1.2 0.8 - 1.2    Comment: (NOTE) INR goal varies based on device and disease states. Performed at [facility], [place] [address] [place], [place] [address]     Imaging / Studies: CT ABDOMEN PELVIS W CONTRAST  Result Date: 10/31/2019 CLINICAL DATA:  Rectal cancer diagnosed in 2020. Chemotherapy and radiation treatment completed. Patient reports a LOWER resection on 10/15/2019. Still having a lot of pain. Difficulty lying on the table for the exam. EXAM: CT ABDOMEN AND PELVIS WITH CONTRAST TECHNIQUE: Multidetector CT imaging of the abdomen and pelvis was performed using the standard protocol following bolus administration of intravenous contrast. CONTRAST:  160mL OMNIPAQUE IOHEXOL 300 MG/ML  SOLN COMPARISON:  08/11/2019 FINDINGS: Lower chest: RIGHT LOWER lobe atelectasis. Heart size is normal. Hepatobiliary: Stable appearance of 3.5 centimeter hemangioma in the posterior segment of the RIGHT hepatic lobe. An 8 millimeter nodule is identified more inferiorly in the RIGHT hepatic lobe, best seen on image 26 of series 2. The appearance is stable. Gallbladder is present and unremarkable. Pancreas: Unremarkable. No pancreatic ductal dilatation or surrounding inflammatory changes. Spleen: Normal in size without focal abnormality. Adrenals/Urinary Tract: Adrenal glands are normal.  No hydronephrosis. Ureters are unremarkable. Urinary bladder wall is thickened. There is air within the lumen of the urinary bladder consistent with infectious process or recent instrumentation. Stomach/Bowel: The stomach is normal in appearance. Proximal jejunum is unremarkable. There is dilatation  and thickening of the mid small bowel. The distal small bowel loops show thickened wall but no dilatation. There is an abscess in the RIGHT LOWER QUADRANT, just inferior to the cecum. Abscess has 2 components, measuring 9.1 x 5.1 and 6.9 x 5.0 centimeters. Small amount of air is identified within this abscess. The rectal anastomosis is surrounded by loculated stool and air, consistent with anastomotic breakdown. There is rectal wall thickening. A tubular gas filled structure extends from the region of the anastomosis into LEFT LOWER QUADRANT and is favored to represent air within a surgical drain site. Vascular/Lymphatic: No significant vascular findings are present. No enlarged abdominal or pelvic lymph nodes. Reproductive: Prostate is unremarkable. Other: Anterior abdominal wall unremarkable. Musculoskeletal: No acute or significant osseous findings. IMPRESSION: 1. Anastomotic breakdown at the rectal anastomosis. Extraluminal stool is present in the region of the anastomosis. 2. Abscess in the RIGHT LOWER QUADRANT measuring 9.1 x 5.1 and 6.9 centimeters. 3. Small bowel obstruction. 4. Air within the lumen of the urinary bladder consistent with infectious process or recent instrumentation. 5. Stable appearance of hepatic hemangioma. 6. Stable appearance of subcentimeter nodule in the RIGHT hepatic lobe, likely benign. Critical Value/emergent results were called by telephone at the time of interpretation on 10/31/2019 at 3:18 pm to provider Brandon Barry , who verbally acknowledged these results. Electronically Signed   By: Nolon Nations M.D.   On: 10/31/2019 15:52    Medications / Allergies: per  chart  Antibiotics: Anti-infectives (From admission, onward)   Start     Dose/Rate Route Frequency Ordered Stop   10/31/19 1800  piperacillin-tazobactam (ZOSYN) IVPB 3.375 g     3.375 g 12.5 mL/hr over 240 Minutes Intravenous Every 8 hours 10/31/19 1719          Note: Portions of this report may have been transcribed using voice recognition software. Every effort was made to ensure accuracy; however, inadvertent computerized transcription errors may be present.   Any transcriptional errors that result from this process are unintentional.    Adin Hector, MD, FACS, MASCRS Gastrointestinal and Minimally Invasive Surgery  [facility] [address] [place], [place] [address] [phone] Fax [phone] Main/Paging  CONTACT INFORMATION: Weekday (9AM-5PM) concerns: Call [facility] main office at [phone] Weeknight (5PM-9AM) or Weekend/Holiday concerns: Check [email or web address] for General Surgery [facility] coverage (Please, do not use SecureChat as it is not reliable communication to surgeons for patient care)      11/01/2019  8:14 AM

## 2019-11-01 NOTE — Consult Note (Signed)
Chief Complaint: Patient was seen in consultation today for abdominal abscess aspiration/drain placement  Referring Physician(s): Carlena Hurl  Supervising Physician: Markus Daft  Patient Status: [facility] - In-pt  History of Present Illness: Brandon Barry is a 44 y.o. male with a past medical history of significant for OSA, HLD and colorectal cancer s/p resection and chemoradiation who is seen today in consultation for abdominal abscess aspiration and possible drain placement. Brandon Barry was first diagnosed with colorectal cancer in August of 2020 and subsequently underwent neoadjuvant chemotherapy followed by radiation (last treatment 08/06/19). He underwent robotic-assisted low anterior resection on 10/15/19 with Dr. [name]. He reportedly called [facility] office several times due to continued abdominal pain and a CT abd/pelvis w/contrast was performed yesterday which showed anastomotic breakdown at the rectal anastomosis with extraluminal stool present as well as an abscess in the right lower quadrant measuring approximately 9 cm. IR has been consulted for aspiration/possible drain placement within this abscess.  Patient seen in his room today, wife at bedside during exam. He reports severe abdominal pain but no other complaints currently, he does get short of breath at times when the pain acutely worsens. His wife states he has not eaten anything since 5/20, has had a few sips of water this AM. They both express good understanding of the procedure today after speaking with Dr. Johney Maine this morning and would like to proceed.  Past Medical History:  Diagnosis Date  . Chronic back pain greater than 3 months duration   . Hyperlipidemia   . OSA on CPAP   . Peripheral neuropathy    Hands and feet from Chemo  . Rectal adenocarcinoma ([place])   . Right shoulder pain     Past Surgical History:  Procedure Laterality Date  . COLONOSCOPY  02/04/2019  . KNEE ARTHROSCOPY Left 11/19/2018  . PORT-A-CATH REMOVAL N/A  10/15/2019   Procedure: PORT REMOVAL;  Surgeon: Leighton Ruff, MD;  Location: [facility];  Service: General;  Laterality: N/A;  . PORTACATH PLACEMENT Left 02/21/2019   Procedure: INSERTION PORT-A-CATH;  Surgeon: Johnathan Hausen, MD;  Location: [facility];  Service: General;  Laterality: Left;  . WISDOM TOOTH EXTRACTION    . XI ROBOTIC ASSISTED LOWER ANTERIOR RESECTION N/A 10/15/2019   Procedure: XI ROBOTIC ASSISTED LOWER ANTERIOR RESECTION, RIGID PROCTOSCOPY;  Surgeon: Leighton Ruff, MD;  Location: [facility];  Service: General;  Laterality: N/A;    Allergies: Patient has no known allergies.  Medications: Prior to Admission medications   Medication Sig Start Date End Date Taking? Authorizing Provider  acetaminophen (TYLENOL) 500 MG tablet Take 500 mg by mouth every 6 (six) hours as needed for moderate pain.   Yes [provider]  gabapentin (NEURONTIN) 300 MG capsule Take 2 capsules (600 mg total) by mouth 2 (two) times daily. Patient taking differently: Take 600 mg by mouth 2 (two) times daily as needed (pain).  09/04/19  Yes Owens Shark, NP  ibuprofen (ADVIL) 200 MG tablet Take 400 mg by mouth every 6 (six) hours as needed for moderate pain.   Yes [provider]  LORazepam (ATIVAN) 0.5 MG tablet Take 1 tablet (0.5 mg total) by mouth every 8 (eight) hours as needed for anxiety. Or nausea. Do not drive while taking [id number]  Yes Owens Shark, NP  oxyCODONE (OXY IR/ROXICODONE) 5 MG immediate release tablet Take 1-2 tablets (5-10 mg total) by mouth every 6 (six) hours as needed for moderate pain, severe pain or breakthrough pain. [id number]  Yes Leighton Ruff,  MD  lidocaine-prilocaine (EMLA) cream Apply 1 application topically as directed. Apply 1 hour prior to stick and cover with plastic wrap Patient not taking: Reported on 09/25/2019 07/10/19   Ladell Pier, MD     Family History  Problem Relation Age of Onset  . Heart attack Father   . Cancer Paternal Grandmother   . Cancer  Paternal Grandfather     Social History   Socioeconomic History  . Marital status: Married    Spouse name: Not on file  . Number of children: Not on file  . Years of education: Not on file  . Highest education level: Not on file  Occupational History  . Not on file  Tobacco Use  . Smoking status: Former Smoker    Types: Cigarettes  . Smokeless tobacco: Never Used  . Tobacco comment: quit over 10 years ago  Substance and Sexual Activity  . Alcohol use: Yes    Comment: 3-4 beers weekly  . Drug use: No  . Sexual activity: Yes  Other Topics Concern  . Not on file  Social History Narrative  . Not on file   Social Determinants of Health   Financial Resource Strain:   . Difficulty of Paying Living Expenses:   Food Insecurity:   . Worried About Charity fundraiser in the Last Year:   . Arboriculturist in the Last Year:   Transportation Needs:   . Film/video editor (Medical):   Marland Kitchen Lack of Transportation (Non-Medical):   Physical Activity:   . Days of Exercise per Week:   . Minutes of Exercise per Session:   Stress:   . Feeling of Stress :   Social Connections:   . Frequency of Communication with Friends and Family:   . Frequency of Social Gatherings with Friends and Family:   . Attends Religious Services:   . Active Member of Clubs or Organizations:   . Attends Archivist Meetings:   Marland Kitchen Marital Status:      Review of Systems: A 12 point ROS discussed and pertinent positives are indicated in the HPI above.  All other systems are negative.  Review of Systems  Constitutional: Negative for chills and fever.  Respiratory: Positive for shortness of breath (when abd pain severe). Negative for cough.   Cardiovascular: Negative for chest pain.  Gastrointestinal: Positive for abdominal pain. Negative for nausea and vomiting.  Genitourinary: Negative for hematuria.  Musculoskeletal: Negative for back pain.  Neurological: Negative for dizziness and headaches.     Vital Signs: BP 137/88 (BP Location: Left Arm)   Pulse 81   Temp 98.1 F (36.7 C) (Oral)   Resp 16   Ht 6' (1.829 m)   Wt 160 lb (72.6 kg)   SpO2 98%   BMI 21.70 kg/m   Physical Exam Vitals and nursing note reviewed.  Constitutional:      General: He is not in acute distress.    Appearance: He is ill-appearing.  HENT:     Head: Normocephalic.     Mouth/Throat:     Mouth: Mucous membranes are moist.     Pharynx: Oropharynx is clear. No oropharyngeal exudate or posterior oropharyngeal erythema.  Cardiovascular:     Rate and Rhythm: Normal rate and regular rhythm.  Pulmonary:     Effort: Pulmonary effort is normal.     Breath sounds: Normal breath sounds.  Abdominal:     Tenderness: There is abdominal tenderness.  Skin:    General: Skin  is warm and dry.  Neurological:     Mental Status: He is alert and oriented to person, place, and time.  Psychiatric:        Mood and Affect: Mood normal.        Behavior: Behavior normal.        Thought Content: Thought content normal.        Judgment: Judgment normal.      MD Evaluation Airway: WNL Heart: WNL Abdomen: WNL Chest/ Lungs: WNL ASA  Classification: 3 Mallampati/Airway Score: Two   Imaging: CT ABDOMEN PELVIS W CONTRAST  Result Date: 10/31/2019 CLINICAL DATA:  Rectal cancer diagnosed in 2020. Chemotherapy and radiation treatment completed. Patient reports a LOWER resection on 10/15/2019. Still having a lot of pain. Difficulty lying on the table for the exam. EXAM: CT ABDOMEN AND PELVIS WITH CONTRAST TECHNIQUE: Multidetector CT imaging of the abdomen and pelvis was performed using the standard protocol following bolus administration of intravenous contrast. CONTRAST:  158mL OMNIPAQUE IOHEXOL 300 MG/ML  SOLN COMPARISON:  08/11/2019 FINDINGS: Lower chest: RIGHT LOWER lobe atelectasis. Heart size is normal. Hepatobiliary: Stable appearance of 3.5 centimeter hemangioma in the posterior segment of the RIGHT hepatic lobe. An  8 millimeter nodule is identified more inferiorly in the RIGHT hepatic lobe, best seen on image 26 of series 2. The appearance is stable. Gallbladder is present and unremarkable. Pancreas: Unremarkable. No pancreatic ductal dilatation or surrounding inflammatory changes. Spleen: Normal in size without focal abnormality. Adrenals/Urinary Tract: Adrenal glands are normal. No hydronephrosis. Ureters are unremarkable. Urinary bladder wall is thickened. There is air within the lumen of the urinary bladder consistent with infectious process or recent instrumentation. Stomach/Bowel: The stomach is normal in appearance. Proximal jejunum is unremarkable. There is dilatation and thickening of the mid small bowel. The distal small bowel loops show thickened wall but no dilatation. There is an abscess in the RIGHT LOWER QUADRANT, just inferior to the cecum. Abscess has 2 components, measuring 9.1 x 5.1 and 6.9 x 5.0 centimeters. Small amount of air is identified within this abscess. The rectal anastomosis is surrounded by loculated stool and air, consistent with anastomotic breakdown. There is rectal wall thickening. A tubular gas filled structure extends from the region of the anastomosis into LEFT LOWER QUADRANT and is favored to represent air within a surgical drain site. Vascular/Lymphatic: No significant vascular findings are present. No enlarged abdominal or pelvic lymph nodes. Reproductive: Prostate is unremarkable. Other: Anterior abdominal wall unremarkable. Musculoskeletal: No acute or significant osseous findings. IMPRESSION: 1. Anastomotic breakdown at the rectal anastomosis. Extraluminal stool is present in the region of the anastomosis. 2. Abscess in the RIGHT LOWER QUADRANT measuring 9.1 x 5.1 and 6.9 centimeters. 3. Small bowel obstruction. 4. Air within the lumen of the urinary bladder consistent with infectious process or recent instrumentation. 5. Stable appearance of hepatic hemangioma. 6. Stable appearance  of subcentimeter nodule in the RIGHT hepatic lobe, likely benign. Critical Value/emergent results were called by telephone at the time of interpretation on 10/31/2019 at 3:18 pm to provider Carlena Hurl , who verbally acknowledged these results. Electronically Signed   By: Nolon Nations M.D.   On: 10/31/2019 15:52    Labs:  CBC: Recent Labs    10/17/19 0418 10/19/19 0906 10/21/19 0255 10/31/19 1750  WBC 10.8* 10.7* 10.6* 12.4*  HGB 13.4 14.1 11.6* 11.3*  HCT 42.0 41.9 35.5* 34.3*  PLT 253 271 278 738*    COAGS: Recent Labs    11/01/19 0731  INR 1.2  BMP: Recent Labs    10/16/19 0445 10/17/19 0418 10/19/19 0906 10/31/19 1750  NA 136 138 133* 137  K 4.4 3.9 4.4 3.8  CL 103 103 98 101  CO2 26 27 25 24   GLUCOSE 134* 116* 134* 102*  BUN 8 11 13 11   CALCIUM 8.7* 8.6* 8.7* 8.5*  CREATININE 0.92 1.00 0.87 0.77  GFRNONAA >60 >60 >60 >60  GFRAA >60 >60 >60 >60    LIVER FUNCTION TESTS: Recent Labs    06/30/19 0855 07/10/19 0805 07/24/19 1444 08/11/19 1050  BILITOT 0.4 0.6 0.8 0.5  AST 38 33 26 41  ALT 53* 46* 32 52*  ALKPHOS 154* 131* 92 125  PROT 6.6 6.4* 6.3* 6.7  ALBUMIN 3.9 3.9 3.9 3.9    TUMOR MARKERS: No results for input(s): AFPTM, CEA, CA199, CHROMGRNA in the last 8760 hours.  Assessment and Plan:  44 y/o M with history of colorectal cancer s/p chemotherapy, radiation and robotic assisted low anterior resection on 10/15/19 with Dr. [name] who had worsening abdominal pain since d/c home - CT A/P w/contrast was ordered by [facility] which noted a RLQ approximately 9 cm abscess. He was directly admitted by [facility] and IR has been asked to perform an image guided aspiration/possible drain placement.  Patient has been NPO since 5/20 except for a few sips of water this AM, no current anticoagulation/antiplatelet therapy. Afebrile, WBC 12.4 hgb 11.3, plt 738, INR 1.2.  Risks and benefits discussed with the patient including bleeding, infection, damage to adjacent  structures, bowel perforation/fistula connection, and sepsis.  All of the patient's questions were answered, patient is agreeable to proceed.  Consent signed and in chart.  IR will follow drain will admitted.   Thank you for this interesting consult.  I greatly enjoyed meeting Nydia Bouton Barry and look forward to participating in their care.  A copy of this report was sent to the requesting provider on this date.  Electronically Signed: Joaquim Nam, PA-C 11/01/2019, 10:12 AM   I spent a total of 40 Minutes in face to face in clinical consultation, greater than 50% of which was counseling/coordinating care for abdominal abscess drain placement.

## 2019-11-02 LAB — CREATININE, SERUM
Creatinine, Ser: 0.99 mg/dL (ref 0.61–1.24)
GFR calc Af Amer: >60 mL/min (ref >60)
GFR calc non Af Amer: >60 mL/min (ref >60)

## 2019-11-02 LAB — CBC
HCT: 30.6 % — ABNORMAL LOW (ref 39.0–52.0)
Hemoglobin: 9.9 g/dL — ABNORMAL LOW (ref 13.0–17.0)
MCH: 30.3 pg (ref 26.0–34.0)
MCHC: 32.4 g/dL (ref 30.0–36.0)
MCV: 93.6 fL (ref 80.0–100.0)
Platelets: 569 10*3/uL — ABNORMAL HIGH (ref 150–400)
RBC: 3.27 MIL/uL — ABNORMAL LOW (ref 4.22–5.81)
RDW: 13.5 % (ref 11.5–15.5)
WBC: 12 10*3/uL — ABNORMAL HIGH (ref 4.0–10.5)
nRBC: 0 % (ref 0.0–0.2)

## 2019-11-02 LAB — POTASSIUM: Potassium: 4 mmol/L (ref 3.5–5.1)

## 2019-11-02 MED ORDER — SODIUM CHLORIDE 0.9 % IV SOLN
200.0000 mg | Freq: Once | INTRAVENOUS | Status: AC
  Administered 2019-11-02: 200 mg via INTRAVENOUS
  Filled 2019-11-02: qty 200

## 2019-11-02 MED ORDER — HYDROCODONE-ACETAMINOPHEN 10-325 MG PO TABS
1.0000 | ORAL_TABLET | ORAL | Status: DC | PRN
  Administered 2019-11-02 – 2019-11-08 (×20): 1 via ORAL
  Filled 2019-11-02 (×20): qty 1

## 2019-11-02 MED ORDER — SODIUM CHLORIDE 0.9 % IV SOLN
100.0000 mg | INTRAVENOUS | Status: DC
  Administered 2019-11-03 – 2019-11-09 (×6): 100 mg via INTRAVENOUS
  Filled 2019-11-02 (×9): qty 100

## 2019-11-02 MED ORDER — ACETAMINOPHEN 500 MG PO TABS
500.0000 mg | ORAL_TABLET | Freq: Three times a day (TID) | ORAL | Status: DC
  Administered 2019-11-02 – 2019-11-25 (×88): 500 mg via ORAL
  Filled 2019-11-02 (×91): qty 1

## 2019-11-02 NOTE — Progress Notes (Signed)
Nydia Bouton III [id number] 45-15-1977  CARE TEAM:  PCP: [facility]  Outpatient Care Team: Patient Care Team: [facility] [place] as PCP - General (Internal Medicine) Leighton Ruff, MD as Consulting Physician (Colon and Rectal Surgery) Carol Ada, MD as Consulting Physician (Gastroenterology) Ladell Pier, MD as Consulting Physician (Oncology)  Inpatient Treatment Team: Treatment Team: Attending Provider: Edison Pace, Md, MD; Technician: Leda Quail, NT; Registered Nurse: Sibyl Parr, RN; Technician: Lewanda Rife, NT; Consulting Physician: Markus Daft, MD; Consulting Physician: Leighton Ruff, MD; Registered Nurse: Sunny Schlein, RN; Consulting Physician: Edison Pace Md, MD   Problem List:   Principal Problem:   Intraabdominal abscess Active Problems:   Cancer of rectum  ypT3, ypN0 s/p TNT & robotic LAR 10/15/2019   Anxiety state   OSA on CPAP   Peripheral neuropathy   Hyperlipidemia   Chronic back pain greater than 3 months duration   Right shoulder pain   Hand foot syndrome w chemotherapy      * No surgery found *      Assessment  Gas and fluid in pelvis suspicious for abscess/delayed anastomotic leak -under better control  [facility] Stay = 1 days)  Plan:  Allow clear liquids.  If does well, may be can advance in feeds through this.  If worse, may need bowel rest with parenteral nutrition.    Pain control.   Resume standing gabapentin.  Add low-dose acetaminophen.  Narcotics and muscle access for breakthrough.  Seems to be under much better control now that the abscesses have been drained  Anxiolysis.  Benadryl & lorazepam PRN.  IV antibiotics.  Continue Zosyn.  With yeast noted on Gram stain, add Eraxis.  Follow-up on cultures and adjust as needed  Drain care management.  If does not improve, may require operative exploration with fecal diversion.  Most likely laparoscopic washout with diverting loop ileostomy to  avoid takedown of the low anastomosis.  He seems to be markedly better with IV antibiotics and percutaneous drainage, so hopefully he will not come to that.  May require TNA if this does not turn around quickly.  We will see.  VTE prophylaxis- SCDs, etc  Mobilize as tolerated to help recovery  40 minutes spent in review, evaluation, examination, counseling, and coordination of care.  More than 50% of that time was spent in counseling.  I updated the patient's status to the patient, spouse, and nurse.  Recommendations were made.  Questions were answered.  Patient much more relaxed.  Wife seems more relieved.  Nursing feels he is definitely improved.  They expressed understanding & appreciation.   11/02/2019    Subjective: (Chief complaint)  Drain placement done x2.  Pain medicine left.  Wife in room.  Wondering if they can try something to eat or drink  Objective:  Vital signs:  Vitals:   11/01/19 1700 11/01/19 2143 11/02/19 0142 11/02/19 0507  BP: (!) 151/95 120/76 130/79 126/80  Pulse: 80 86 81 81  Resp: 16 16 18 15   Temp: 99.1 F (37.3 C)  99.5 F (37.5 C) 99.2 F (37.3 C)  TempSrc: Oral  Oral Oral  SpO2:  96% 97% 99%  Weight:      Height:        Last BM Date: 11/01/19  Intake/Output   Yesterday:  05/22 0701 - 05/23 0700 In: 2212.3 [P.O.:100; I.V.:1682.4; IV Piggyback:404.9] Out: 185 [Drains:185] This shift:  Total I/O In: 1087.9 [P.O.:100; I.V.:880.6; IV Piggyback:107.3] Out: 32 [Drains:32]  Bowel function:  Flatus: No  BM:  YES  Drain: Purulent   Physical Exam:  General: Pt awake/alert in no acute distress Eyes: PERRL, normal EOM.  Sclera clear.  No icterus Neuro: CN II-XII intact w/o focal sensory/motor deficits. Lymph: No head/neck/groin lymphadenopathy Psych:  No delerium/psychosis/paranoia.  Oriented x 4.  Much more calm and relaxed.  Not tearful or anxious this morning. HENT: Normocephalic, Mucus membranes moist.  No thrush Neck: Supple,  No tracheal deviation.  No obvious thyromegaly Chest: No pain to chest wall compression.  Good respiratory excursion.  No audible wheezing CV:  Pulses intact.  Regular rhythm.  No major extremity edema MS: Normal AROM mjr joints.  No obvious deformity  Abdomen: Soft.  Mildy distended.  Tenderness at RLQ at old drain site - no active drainage.  Tenderness mainly at drain site only.  Improved.  No evidence of peritonitis.  No incarcerated hernias.  Ext:  No deformity.  No mjr edema.  No cyanosis Skin: No petechiae / purpurea.  No major sores.  Warm and dry    Results:   Cultures: Recent Results (from the past 720 hour(s))  SARS CORONAVIRUS 2 (TAT 6-24 HRS) Nasopharyngeal Nasopharyngeal Swab     Status: None   Collection Time: 10/11/19  1:49 PM   Specimen: Nasopharyngeal Swab  Result Value Ref Range Status   SARS Coronavirus 2 NEGATIVE NEGATIVE Final    Comment: (NOTE) SARS-CoV-2 target nucleic acids are NOT DETECTED. The SARS-CoV-2 RNA is generally detectable in upper and lower respiratory specimens during the acute phase of infection. Negative results do not preclude SARS-CoV-2 infection, do not rule out co-infections with other pathogens, and should not be used as the sole basis for treatment or other patient management decisions. Negative results must be combined with clinical observations, patient history, and epidemiological information. The expected result is Negative. Fact Sheet for Patients: SugarRoll.be Fact Sheet for Healthcare Providers: https://www.woods-mathews.com/ This test is not yet approved or cleared by the [place] FDA and  has been authorized for detection and/or diagnosis of SARS-CoV-2 by FDA under an Emergency Use Authorization (EUA). This EUA will remain  in effect (meaning this test can be used) for the duration of the COVID-19 declaration under Section 56 4(b)(1) of the Act, 21 U.S.C. section 360bbb-3(b)(1),  unless the authorization is terminated or revoked sooner. Performed at [facility], [place] [name] [place], [place] [address]   Aerobic/Anaerobic Culture (surgical/deep wound)     Status: None (Preliminary result)   Collection Time: 11/01/19  2:05 PM   Specimen: Abscess  Result Value Ref Range Status   Specimen Description   Final    ABSCESS Performed at [facility] [address], [place], [place] [address]    Special Requests   Final    NONE Performed at [facility], [place] [address], [place], [place] [address]    Gram Stain   Final    MODERATE WBC PRESENT, PREDOMINANTLY PMN MODERATE GRAM POSITIVE COCCI IN PAIRS IN CHAINS FEW YEAST Performed at [facility], [place] [address], [place], [place] [address]    Culture PENDING  Incomplete   Report Status PENDING  Incomplete    Labs: Results for orders placed or performed during the hospital encounter of 10/31/19 (from the past 48 hour(s))  Basic metabolic panel     Status: Abnormal   Collection Time: 10/31/19  5:50 PM  Result Value Ref Range   Sodium 137 135 - 145 mmol/L   Potassium  3.8 3.5 - 5.1 mmol/L   Chloride 101 98 - 111 mmol/L   CO2 24 22 - 32 mmol/L   Glucose, Bld 102 (H) 70 - 99 mg/dL    Comment: Glucose reference range applies only to samples taken after fasting for at least 8 hours.   BUN 11 6 - 20 mg/dL   Creatinine, Ser 0.77 0.61 - 1.24 mg/dL   Calcium 8.5 (L) 8.9 - 10.3 mg/dL   GFR calc non Af Amer >60 >60 mL/min   GFR calc Af Amer >60 >60 mL/min   Anion gap 12 5 - 15    Comment: Performed at [facility], [place] [address], [place], [place] [address]  CBC WITH DIFFERENTIAL     Status: Abnormal   Collection Time: 10/31/19  5:50 PM  Result Value Ref Range   WBC 12.4 (H) 4.0 - 10.5 K/uL   RBC 3.71 (L) 4.22 - 5.81 MIL/uL   Hemoglobin 11.3 (L) 13.0 - 17.0 g/dL   HCT 34.3 (L) 39.0 - 52.0 %   MCV 92.5 80.0 - 100.0 fL   MCH 30.5 26.0 - 34.0 pg    MCHC 32.9 30.0 - 36.0 g/dL   RDW 13.2 11.5 - 15.5 %   Platelets 738 (H) 150 - 400 K/uL   nRBC 0.0 0.0 - 0.2 %   Neutrophils Relative % 88 %   Neutro Abs 10.9 (H) 1.7 - 7.7 K/uL   Lymphocytes Relative 4 %   Lymphs Abs 0.5 (L) 0.7 - 4.0 K/uL   Monocytes Relative 7 %   Monocytes Absolute 0.8 0.1 - 1.0 K/uL   Eosinophils Relative 0 %   Eosinophils Absolute 0.0 0.0 - 0.5 K/uL   Basophils Relative 0 %   Basophils Absolute 0.0 0.0 - 0.1 K/uL   Immature Granulocytes 1 %   Abs Immature Granulocytes 0.09 (H) 0.00 - 0.07 K/uL    Comment: Performed at [facility], [place] [address] [place], [place] [address]  Protime-INR     Status: None   Collection Time: 11/01/19  7:31 AM  Result Value Ref Range   Prothrombin Time 14.9 11.4 - 15.2 seconds   INR 1.2 0.8 - 1.2    Comment: (NOTE) INR goal varies based on device and disease states. Performed at [facility], [place] [address] [place], [place] [address]   Aerobic/Anaerobic Culture (surgical/deep wound)     Status: None (Preliminary result)   Collection Time: 11/01/19  2:05 PM   Specimen: Abscess  Result Value Ref Range   Specimen Description      ABSCESS Performed at [place] [address] [place], [place] [address]    Special Requests      NONE Performed at [facility], [place] [address], [place], [place] [address]    Gram Stain      MODERATE WBC PRESENT, PREDOMINANTLY PMN MODERATE GRAM POSITIVE COCCI IN PAIRS IN CHAINS FEW YEAST Performed at [facility], [place] [address], [place], [place] [address]    Culture PENDING    Report Status PENDING   CBC     Status: Abnormal   Collection Time: 11/02/19  4:30 AM  Result Value Ref Range   WBC 12.0 (H) 4.0 - 10.5 K/uL   RBC 3.27 (L) 4.22 - 5.81 MIL/uL   Hemoglobin 9.9 (L) 13.0 - 17.0 g/dL   HCT 30.6 (L) 39.0 - 52.0 %   MCV 93.6 80.0 - 100.0 fL  MCH 30.3 26.0 - 34.0 pg   MCHC 32.4 30.0 - 36.0 g/dL    RDW 13.5 11.5 - 15.5 %   Platelets 569 (H) 150 - 400 K/uL   nRBC 0.0 0.0 - 0.2 %    Comment: Performed at [facility], [place] [address], [place], [place] [address]  Potassium     Status: None   Collection Time: 11/02/19  4:30 AM  Result Value Ref Range   Potassium 4.0 3.5 - 5.1 mmol/L    Comment: Performed at [facility], [place] [address] [place], [place] [address]  Creatinine, serum     Status: None   Collection Time: 11/02/19  4:30 AM  Result Value Ref Range   Creatinine, Ser 0.99 0.61 - 1.24 mg/dL   GFR calc non Af Amer >60 >60 mL/min   GFR calc Af Amer >60 >60 mL/min    Comment: Performed at [facility], [place] [address], [place], [place] [address]    Imaging / Studies: CT ABDOMEN PELVIS W CONTRAST  Result Date: 10/31/2019 CLINICAL DATA:  Rectal cancer diagnosed in 2020. Chemotherapy and radiation treatment completed. Patient reports a LOWER resection on 10/15/2019. Still having a lot of pain. Difficulty lying on the table for the exam. EXAM: CT ABDOMEN AND PELVIS WITH CONTRAST TECHNIQUE: Multidetector CT imaging of the abdomen and pelvis was performed using the standard protocol following bolus administration of intravenous contrast. CONTRAST:  163mL OMNIPAQUE IOHEXOL 300 MG/ML  SOLN COMPARISON:  08/11/2019 FINDINGS: Lower chest: RIGHT LOWER lobe atelectasis. Heart size is normal. Hepatobiliary: Stable appearance of 3.5 centimeter hemangioma in the posterior segment of the RIGHT hepatic lobe. An 8 millimeter nodule is identified more inferiorly in the RIGHT hepatic lobe, best seen on image 26 of series 2. The appearance is stable. Gallbladder is present and unremarkable. Pancreas: Unremarkable. No pancreatic ductal dilatation or surrounding inflammatory changes. Spleen: Normal in size without focal abnormality. Adrenals/Urinary Tract: Adrenal glands are normal. No hydronephrosis. Ureters are unremarkable. Urinary bladder wall is  thickened. There is air within the lumen of the urinary bladder consistent with infectious process or recent instrumentation. Stomach/Bowel: The stomach is normal in appearance. Proximal jejunum is unremarkable. There is dilatation and thickening of the mid small bowel. The distal small bowel loops show thickened wall but no dilatation. There is an abscess in the RIGHT LOWER QUADRANT, just inferior to the cecum. Abscess has 2 components, measuring 9.1 x 5.1 and 6.9 x 5.0 centimeters. Small amount of air is identified within this abscess. The rectal anastomosis is surrounded by loculated stool and air, consistent with anastomotic breakdown. There is rectal wall thickening. A tubular gas filled structure extends from the region of the anastomosis into LEFT LOWER QUADRANT and is favored to represent air within a surgical drain site. Vascular/Lymphatic: No significant vascular findings are present. No enlarged abdominal or pelvic lymph nodes. Reproductive: Prostate is unremarkable. Other: Anterior abdominal wall unremarkable. Musculoskeletal: No acute or significant osseous findings. IMPRESSION: 1. Anastomotic breakdown at the rectal anastomosis. Extraluminal stool is present in the region of the anastomosis. 2. Abscess in the RIGHT LOWER QUADRANT measuring 9.1 x 5.1 and 6.9 centimeters. 3. Small bowel obstruction. 4. Air within the lumen of the urinary bladder consistent with infectious process or recent instrumentation. 5. Stable appearance of hepatic hemangioma. 6. Stable appearance of subcentimeter nodule in the RIGHT hepatic lobe, likely benign. Critical Value/emergent results were called by telephone at the time of interpretation on 10/31/2019 at 3:18 pm to  provider Carlena Hurl , who verbally acknowledged these results. Electronically Signed   By: Nolon Nations M.D.   On: 10/31/2019 15:52   CT IMAGE GUIDED DRAINAGE BY PERCUTANEOUS CATHETER  Result Date: 11/01/2019 INDICATION: 44 year old with history rectal  adenocarcinoma. Patient had a low anterior resection on 10/15/2019. Patient presented with abdominal pain. CT imaging demonstrated a large abscess in the right lower quadrant and evidence for anastomotic breakdown with stool in the presacral region. EXAM: CT-GUIDED PLACEMENT OF TWO DRAINS IN THE RIGHT LOWER QUADRANT ABSCESSES CT-GUIDED PLACEMENT OF DRAIN IN PRESACRAL ABSCESS/FECAL COLLECTION MEDICATIONS: The patient is currently admitted to the hospital and receiving intravenous antibiotics. The antibiotics were administered within an appropriate time frame prior to the initiation of the procedure. ANESTHESIA/SEDATION: 5.0 mg IV Versed 250 mcg IV Fentanyl Moderate Sedation Time:  1 hour 34 minutes The patient was continuously monitored during the procedure by the interventional radiology nurse under my direct supervision. COMPLICATIONS: None immediate. TECHNIQUE: Informed written consent was obtained from the patient after a thorough discussion of the procedural risks, benefits and alternatives. All questions were addressed. Maximal Sterile Barrier Technique was utilized including caps, mask, sterile gowns, sterile gloves, sterile drape, hand hygiene and skin antiseptic. A timeout was performed prior to the initiation of the procedure. PROCEDURE: Patient was initially placed at supine. CT images through the lower abdomen and pelvis were obtained. The abscess is in the right lower quadrant was initially targeted. The right lower abdomen was prepped with chlorhexidine and sterile field was created. Maximal barrier sterile technique was utilized including caps, mask, sterile gowns, sterile gloves, sterile drape, hand hygiene and skin antiseptic. The skin was anesthetized with 1% lidocaine. Using CT guidance, an 18 gauge trocar needle was directed into the most inferior aspect of the right lower quadrant abscess along the anterior lower abdomen. Thick yellow purulent fluid was aspirated. Superstiff Amplatz wire was  advanced into the collection. The tract was dilated to accommodate a 10 [place] drain. Follow up CT images demonstrated that there was still a lateral component to the right lower quadrant abscess that was not being drained. Therefore, the right lateral abdomen was prepped with chlorhexidine and sterile field was created. Skin was anesthetized with 1% lidocaine. Using CT guidance, 18 gauge trocar needle was directed into the lateral abscess component. Thick yellow fluid was aspirated. Superstiff Amplatz wire was advanced into the collection and another 10 [place] multipurpose drain was placed. A total of 75 mL of thick yellow fluid was removed from the right lower quadrant abscesses. Both catheters were sutured to skin and attached to suction bulbs. Dressings were placed. The patient was rolled onto his left side. The abscess/fecal collection in the presacral region was targeted. Right transgluteal approach was selected. Skin was prepped and draped in sterile fashion. Maximal barrier sterile technique was utilized including caps, mask, sterile gowns, sterile gloves, sterile drape, hand hygiene and skin antiseptic. Skin was anesthetized with 1% lidocaine. Using CT guidance, an 18 gauge trocar needle was directed into the abscess/fecal collection. Super stiff Amplatz wire was placed. However, it appeared that the wire was initially extending into the colon. As a result, this wire needle were removed. Needle was redirected slightly more lateral. The wire was extending anteriorly into the collection rather than into the colon. The tract was dilated to accommodate a 12 [place] multipurpose drain. Approximately 20 mL of thick brown foul-smelling purulent fluid was removed. Findings are compatible with a combination of purulent fluid and feces. The drain was irrigated with  sterile saline and attached to a suction bulb. Catheter was sutured to skin. Dressing was placed. FINDINGS: Initial CT images image demonstrated complex  air-fluid collection in the right lower quadrant compatible with an abscess. The abscess was well delineated on this examination because contrast had moved into the colon compared to the previous examination. Right lower quadrant abscess is complex and required 2 separate drains for adequate decompression. Again noted is stool-like material in the presacral region around the rectal anastomosis. This stool-like material now contains high-density material which is compatible with oral contrast from the prior CT. Drain was successfully placed within the abscess/fecal collection around the anastomosis and distal colon. IMPRESSION: 1. CT-guided placement of 2 drains within the right lower quadrant abscesses. Thick yellow purulent fluid was removed from the right lower quadrant abscesses. 2. CT images confirmed oral contrast leaking into the presacral space and compatible with an anastomotic leak. 3. CT-guided placement of a drain within the presacral abscess/fecal collection. Electronically Signed   By: Markus Daft M.D.   On: 11/01/2019 21:00   CT IMAGE GUIDED DRAINAGE BY PERCUTANEOUS CATHETER  Result Date: 11/01/2019 INDICATION: 44 year old with history rectal adenocarcinoma. Patient had a low anterior resection on 10/15/2019. Patient presented with abdominal pain. CT imaging demonstrated a large abscess in the right lower quadrant and evidence for anastomotic breakdown with stool in the presacral region. EXAM: CT-GUIDED PLACEMENT OF TWO DRAINS IN THE RIGHT LOWER QUADRANT ABSCESSES CT-GUIDED PLACEMENT OF DRAIN IN PRESACRAL ABSCESS/FECAL COLLECTION MEDICATIONS: The patient is currently admitted to the hospital and receiving intravenous antibiotics. The antibiotics were administered within an appropriate time frame prior to the initiation of the procedure. ANESTHESIA/SEDATION: 5.0 mg IV Versed 250 mcg IV Fentanyl Moderate Sedation Time:  1 hour 34 minutes The patient was continuously monitored during the procedure by  the interventional radiology nurse under my direct supervision. COMPLICATIONS: None immediate. TECHNIQUE: Informed written consent was obtained from the patient after a thorough discussion of the procedural risks, benefits and alternatives. All questions were addressed. Maximal Sterile Barrier Technique was utilized including caps, mask, sterile gowns, sterile gloves, sterile drape, hand hygiene and skin antiseptic. A timeout was performed prior to the initiation of the procedure. PROCEDURE: Patient was initially placed at supine. CT images through the lower abdomen and pelvis were obtained. The abscess is in the right lower quadrant was initially targeted. The right lower abdomen was prepped with chlorhexidine and sterile field was created. Maximal barrier sterile technique was utilized including caps, mask, sterile gowns, sterile gloves, sterile drape, hand hygiene and skin antiseptic. The skin was anesthetized with 1% lidocaine. Using CT guidance, an 18 gauge trocar needle was directed into the most inferior aspect of the right lower quadrant abscess along the anterior lower abdomen. Thick yellow purulent fluid was aspirated. Superstiff Amplatz wire was advanced into the collection. The tract was dilated to accommodate a 10 [place] drain. Follow up CT images demonstrated that there was still a lateral component to the right lower quadrant abscess that was not being drained. Therefore, the right lateral abdomen was prepped with chlorhexidine and sterile field was created. Skin was anesthetized with 1% lidocaine. Using CT guidance, 18 gauge trocar needle was directed into the lateral abscess component. Thick yellow fluid was aspirated. Superstiff Amplatz wire was advanced into the collection and another 10 [place] multipurpose drain was placed. A total of 75 mL of thick yellow fluid was removed from the right lower quadrant abscesses. Both catheters were sutured to skin and attached to suction bulbs.  Dressings were  placed. The patient was rolled onto his left side. The abscess/fecal collection in the presacral region was targeted. Right transgluteal approach was selected. Skin was prepped and draped in sterile fashion. Maximal barrier sterile technique was utilized including caps, mask, sterile gowns, sterile gloves, sterile drape, hand hygiene and skin antiseptic. Skin was anesthetized with 1% lidocaine. Using CT guidance, an 18 gauge trocar needle was directed into the abscess/fecal collection. Super stiff Amplatz wire was placed. However, it appeared that the wire was initially extending into the colon. As a result, this wire needle were removed. Needle was redirected slightly more lateral. The wire was extending anteriorly into the collection rather than into the colon. The tract was dilated to accommodate a 12 [place] multipurpose drain. Approximately 20 mL of thick brown foul-smelling purulent fluid was removed. Findings are compatible with a combination of purulent fluid and feces. The drain was irrigated with sterile saline and attached to a suction bulb. Catheter was sutured to skin. Dressing was placed. FINDINGS: Initial CT images image demonstrated complex air-fluid collection in the right lower quadrant compatible with an abscess. The abscess was well delineated on this examination because contrast had moved into the colon compared to the previous examination. Right lower quadrant abscess is complex and required 2 separate drains for adequate decompression. Again noted is stool-like material in the presacral region around the rectal anastomosis. This stool-like material now contains high-density material which is compatible with oral contrast from the prior CT. Drain was successfully placed within the abscess/fecal collection around the anastomosis and distal colon. IMPRESSION: 1. CT-guided placement of 2 drains within the right lower quadrant abscesses. Thick yellow purulent fluid was removed from the right lower  quadrant abscesses. 2. CT images confirmed oral contrast leaking into the presacral space and compatible with an anastomotic leak. 3. CT-guided placement of a drain within the presacral abscess/fecal collection. Electronically Signed   By: Markus Daft M.D.   On: 11/01/2019 21:00   CT IMAGE GUIDED DRAINAGE BY PERCUTANEOUS CATHETER  Result Date: 11/01/2019 INDICATION: 44 year old with history rectal adenocarcinoma. Patient had a low anterior resection on 10/15/2019. Patient presented with abdominal pain. CT imaging demonstrated a large abscess in the right lower quadrant and evidence for anastomotic breakdown with stool in the presacral region. EXAM: CT-GUIDED PLACEMENT OF TWO DRAINS IN THE RIGHT LOWER QUADRANT ABSCESSES CT-GUIDED PLACEMENT OF DRAIN IN PRESACRAL ABSCESS/FECAL COLLECTION MEDICATIONS: The patient is currently admitted to the hospital and receiving intravenous antibiotics. The antibiotics were administered within an appropriate time frame prior to the initiation of the procedure. ANESTHESIA/SEDATION: 5.0 mg IV Versed 250 mcg IV Fentanyl Moderate Sedation Time:  1 hour 34 minutes The patient was continuously monitored during the procedure by the interventional radiology nurse under my direct supervision. COMPLICATIONS: None immediate. TECHNIQUE: Informed written consent was obtained from the patient after a thorough discussion of the procedural risks, benefits and alternatives. All questions were addressed. Maximal Sterile Barrier Technique was utilized including caps, mask, sterile gowns, sterile gloves, sterile drape, hand hygiene and skin antiseptic. A timeout was performed prior to the initiation of the procedure. PROCEDURE: Patient was initially placed at supine. CT images through the lower abdomen and pelvis were obtained. The abscess is in the right lower quadrant was initially targeted. The right lower abdomen was prepped with chlorhexidine and sterile field was created. Maximal barrier sterile  technique was utilized including caps, mask, sterile gowns, sterile gloves, sterile drape, hand hygiene and skin antiseptic. The skin was anesthetized with 1%  lidocaine. Using CT guidance, an 18 gauge trocar needle was directed into the most inferior aspect of the right lower quadrant abscess along the anterior lower abdomen. Thick yellow purulent fluid was aspirated. Superstiff Amplatz wire was advanced into the collection. The tract was dilated to accommodate a 10 [place] drain. Follow up CT images demonstrated that there was still a lateral component to the right lower quadrant abscess that was not being drained. Therefore, the right lateral abdomen was prepped with chlorhexidine and sterile field was created. Skin was anesthetized with 1% lidocaine. Using CT guidance, 18 gauge trocar needle was directed into the lateral abscess component. Thick yellow fluid was aspirated. Superstiff Amplatz wire was advanced into the collection and another 10 [place] multipurpose drain was placed. A total of 75 mL of thick yellow fluid was removed from the right lower quadrant abscesses. Both catheters were sutured to skin and attached to suction bulbs. Dressings were placed. The patient was rolled onto his left side. The abscess/fecal collection in the presacral region was targeted. Right transgluteal approach was selected. Skin was prepped and draped in sterile fashion. Maximal barrier sterile technique was utilized including caps, mask, sterile gowns, sterile gloves, sterile drape, hand hygiene and skin antiseptic. Skin was anesthetized with 1% lidocaine. Using CT guidance, an 18 gauge trocar needle was directed into the abscess/fecal collection. Super stiff Amplatz wire was placed. However, it appeared that the wire was initially extending into the colon. As a result, this wire needle were removed. Needle was redirected slightly more lateral. The wire was extending anteriorly into the collection rather than into the colon. The  tract was dilated to accommodate a 12 [place] multipurpose drain. Approximately 20 mL of thick brown foul-smelling purulent fluid was removed. Findings are compatible with a combination of purulent fluid and feces. The drain was irrigated with sterile saline and attached to a suction bulb. Catheter was sutured to skin. Dressing was placed. FINDINGS: Initial CT images image demonstrated complex air-fluid collection in the right lower quadrant compatible with an abscess. The abscess was well delineated on this examination because contrast had moved into the colon compared to the previous examination. Right lower quadrant abscess is complex and required 2 separate drains for adequate decompression. Again noted is stool-like material in the presacral region around the rectal anastomosis. This stool-like material now contains high-density material which is compatible with oral contrast from the prior CT. Drain was successfully placed within the abscess/fecal collection around the anastomosis and distal colon. IMPRESSION: 1. CT-guided placement of 2 drains within the right lower quadrant abscesses. Thick yellow purulent fluid was removed from the right lower quadrant abscesses. 2. CT images confirmed oral contrast leaking into the presacral space and compatible with an anastomotic leak. 3. CT-guided placement of a drain within the presacral abscess/fecal collection. Electronically Signed   By: Markus Daft M.D.   On: 11/01/2019 21:00    Medications / Allergies: per chart  Antibiotics: Anti-infectives (From admission, onward)   Start     Dose/Rate Route Frequency Ordered Stop   11/02/19 0700  anidulafungin (ERAXIS) 100 mg in sodium chloride 0.9 % 100 mL IVPB     100 mg 78 mL/hr over 100 Minutes Intravenous Every 24 hours 11/02/19 0658     10/31/19 1800  piperacillin-tazobactam (ZOSYN) IVPB 3.375 g     3.375 g 12.5 mL/hr over 240 Minutes Intravenous Every 8 hours 10/31/19 1719          Note: Portions of  this report may have been  transcribed using voice recognition software. Every effort was made to ensure accuracy; however, inadvertent computerized transcription errors may be present.   Any transcriptional errors that result from this process are unintentional.    Adin Hector, MD, FACS, MASCRS Gastrointestinal and Minimally Invasive Surgery  [facility] [address] [name], [place] [address] [phone] Fax [phone] Main/Paging  CONTACT INFORMATION: Weekday (9AM-5PM) concerns: Call [facility] main office at [phone] Weeknight (5PM-9AM) or Weekend/Holiday concerns: Check [email or web address] for General Surgery [facility] coverage (Please, do not use SecureChat as it is not reliable communication to surgeons for patient care)      11/02/2019  6:58 AM

## 2019-11-02 NOTE — Progress Notes (Signed)
Referring Physician(s): Brandon Barry  Supervising Physician: Markus Daft  Patient Status:  [facility] - In-pt  Chief Complaint: Follow abdominal abscess drain placement x 3 on 5/22 with Dr. Anselm Pancoast  Subjective:  Brandon Barry and his wife are both sleeping peacefully upon entry to the room - he reports significant improvement in pain and has no other complaints right now except wanting to go back to sleep.   Allergies: Patient has no known allergies.  Medications: Prior to Admission medications   Medication Sig Start Date End Date Taking? Authorizing Provider  acetaminophen (TYLENOL) 500 MG tablet Take 500 mg by mouth every 6 (six) hours as needed for moderate pain.   Yes [provider]  gabapentin (NEURONTIN) 300 MG capsule Take 2 capsules (600 mg total) by mouth 2 (two) times daily. Patient taking differently: Take 600 mg by mouth 2 (two) times daily as needed (pain).  09/04/19  Yes Owens Shark, NP  ibuprofen (ADVIL) 200 MG tablet Take 400 mg by mouth every 6 (six) hours as needed for moderate pain.   Yes [provider]  LORazepam (ATIVAN) 0.5 MG tablet Take 1 tablet (0.5 mg total) by mouth every 8 (eight) hours as needed for anxiety. Or nausea. Do not drive while taking [id number]  Yes Owens Shark, NP  oxyCODONE (OXY IR/ROXICODONE) 5 MG immediate release tablet Take 1-2 tablets (5-10 mg total) by mouth every 6 (six) hours as needed for moderate pain, severe pain or breakthrough pain. [id number]  Yes Leighton Ruff, MD  lidocaine-prilocaine (EMLA) cream Apply 1 application topically as directed. Apply 1 hour prior to stick and cover with plastic wrap Patient not taking: Reported on 09/25/2019 07/10/19   Ladell Pier, MD     Vital Signs: BP 119/76   Pulse 65   Temp 100 F (37.8 C) (Oral)   Resp 17   Ht 6' (1.829 m)   Wt 160 lb (72.6 kg)   SpO2 94%   BMI 21.70 kg/m   Physical Exam Vitals and nursing note reviewed.  Constitutional:      General: He is not in acute  distress.    Comments: Somnolent - sleeping when I arrived, arouses easily to voice and answers questions appropriately.  HENT:     Head: Normocephalic.  Cardiovascular:     Rate and Rhythm: Normal rate.  Pulmonary:     Effort: Pulmonary effort is normal.  Abdominal:     Palpations: Abdomen is soft.     Tenderness: There is abdominal tenderness (appropriately tender over drain insertion sites).     Comments: (+) RLQ drains x 2 to suction bulb with scant purulent output, insertion sites unremarkable, both flush/aspirate easily (+) presacral drain to suction bulb with feculent appearing output, insertion site not examined as patient is sleeping, no issues reported with flushing.  Skin:    General: Skin is warm and dry.  Neurological:     Mental Status: Mental status is at baseline.     Imaging: CT ABDOMEN PELVIS W CONTRAST  Result Date: 10/31/2019 CLINICAL DATA:  Rectal cancer diagnosed in 2020. Chemotherapy and radiation treatment completed. Patient reports a LOWER resection on 10/15/2019. Still having a lot of pain. Difficulty lying on the table for the exam. EXAM: CT ABDOMEN AND PELVIS WITH CONTRAST TECHNIQUE: Multidetector CT imaging of the abdomen and pelvis was performed using the standard protocol following bolus administration of intravenous contrast. CONTRAST:  178mL OMNIPAQUE IOHEXOL 300 MG/ML  SOLN COMPARISON:  08/11/2019 FINDINGS: Lower chest: RIGHT  LOWER lobe atelectasis. Heart size is normal. Hepatobiliary: Stable appearance of 3.5 centimeter hemangioma in the posterior segment of the RIGHT hepatic lobe. An 8 millimeter nodule is identified more inferiorly in the RIGHT hepatic lobe, best seen on image 26 of series 2. The appearance is stable. Gallbladder is present and unremarkable. Pancreas: Unremarkable. No pancreatic ductal dilatation or surrounding inflammatory changes. Spleen: Normal in size without focal abnormality. Adrenals/Urinary Tract: Adrenal glands are normal. No  hydronephrosis. Ureters are unremarkable. Urinary bladder wall is thickened. There is air within the lumen of the urinary bladder consistent with infectious process or recent instrumentation. Stomach/Bowel: The stomach is normal in appearance. Proximal jejunum is unremarkable. There is dilatation and thickening of the mid small bowel. The distal small bowel loops show thickened wall but no dilatation. There is an abscess in the RIGHT LOWER QUADRANT, just inferior to the cecum. Abscess has 2 components, measuring 9.1 x 5.1 and 6.9 x 5.0 centimeters. Small amount of air is identified within this abscess. The rectal anastomosis is surrounded by loculated stool and air, consistent with anastomotic breakdown. There is rectal wall thickening. A tubular gas filled structure extends from the region of the anastomosis into LEFT LOWER QUADRANT and is favored to represent air within a surgical drain site. Vascular/Lymphatic: No significant vascular findings are present. No enlarged abdominal or pelvic lymph nodes. Reproductive: Prostate is unremarkable. Other: Anterior abdominal wall unremarkable. Musculoskeletal: No acute or significant osseous findings. IMPRESSION: 1. Anastomotic breakdown at the rectal anastomosis. Extraluminal stool is present in the region of the anastomosis. 2. Abscess in the RIGHT LOWER QUADRANT measuring 9.1 x 5.1 and 6.9 centimeters. 3. Small bowel obstruction. 4. Air within the lumen of the urinary bladder consistent with infectious process or recent instrumentation. 5. Stable appearance of hepatic hemangioma. 6. Stable appearance of subcentimeter nodule in the RIGHT hepatic lobe, likely benign. Critical Value/emergent results were called by telephone at the time of interpretation on 10/31/2019 at 3:18 pm to provider Brandon Barry , who verbally acknowledged these results. Electronically Signed   By: Nolon Nations M.D.   On: 10/31/2019 15:52   CT IMAGE GUIDED DRAINAGE BY PERCUTANEOUS  CATHETER  Result Date: 11/01/2019 INDICATION: 44 year old with history rectal adenocarcinoma. Patient had a low anterior resection on 10/15/2019. Patient presented with abdominal pain. CT imaging demonstrated a large abscess in the right lower quadrant and evidence for anastomotic breakdown with stool in the presacral region. EXAM: CT-GUIDED PLACEMENT OF TWO DRAINS IN THE RIGHT LOWER QUADRANT ABSCESSES CT-GUIDED PLACEMENT OF DRAIN IN PRESACRAL ABSCESS/FECAL COLLECTION MEDICATIONS: The patient is currently admitted to the hospital and receiving intravenous antibiotics. The antibiotics were administered within an appropriate time frame prior to the initiation of the procedure. ANESTHESIA/SEDATION: 5.0 mg IV Versed 250 mcg IV Fentanyl Moderate Sedation Time:  1 hour 34 minutes The patient was continuously monitored during the procedure by the interventional radiology nurse under my direct supervision. COMPLICATIONS: None immediate. TECHNIQUE: Informed written consent was obtained from the patient after a thorough discussion of the procedural risks, benefits and alternatives. All questions were addressed. Maximal Sterile Barrier Technique was utilized including caps, mask, sterile gowns, sterile gloves, sterile drape, hand hygiene and skin antiseptic. A timeout was performed prior to the initiation of the procedure. PROCEDURE: Patient was initially placed at supine. CT images through the lower abdomen and pelvis were obtained. The abscess is in the right lower quadrant was initially targeted. The right lower abdomen was prepped with chlorhexidine and sterile field was created. Maximal barrier sterile  technique was utilized including caps, mask, sterile gowns, sterile gloves, sterile drape, hand hygiene and skin antiseptic. The skin was anesthetized with 1% lidocaine. Using CT guidance, an 18 gauge trocar needle was directed into the most inferior aspect of the right lower quadrant abscess along the anterior lower  abdomen. Thick yellow purulent fluid was aspirated. Superstiff Amplatz wire was advanced into the collection. The tract was dilated to accommodate a 10 [place] drain. Follow up CT images demonstrated that there was still a lateral component to the right lower quadrant abscess that was not being drained. Therefore, the right lateral abdomen was prepped with chlorhexidine and sterile field was created. Skin was anesthetized with 1% lidocaine. Using CT guidance, 18 gauge trocar needle was directed into the lateral abscess component. Thick yellow fluid was aspirated. Superstiff Amplatz wire was advanced into the collection and another 10 [place] multipurpose drain was placed. A total of 75 mL of thick yellow fluid was removed from the right lower quadrant abscesses. Both catheters were sutured to skin and attached to suction bulbs. Dressings were placed. The patient was rolled onto his left side. The abscess/fecal collection in the presacral region was targeted. Right transgluteal approach was selected. Skin was prepped and draped in sterile fashion. Maximal barrier sterile technique was utilized including caps, mask, sterile gowns, sterile gloves, sterile drape, hand hygiene and skin antiseptic. Skin was anesthetized with 1% lidocaine. Using CT guidance, an 18 gauge trocar needle was directed into the abscess/fecal collection. Super stiff Amplatz wire was placed. However, it appeared that the wire was initially extending into the colon. As a result, this wire needle were removed. Needle was redirected slightly more lateral. The wire was extending anteriorly into the collection rather than into the colon. The tract was dilated to accommodate a 12 [place] multipurpose drain. Approximately 20 mL of thick brown foul-smelling purulent fluid was removed. Findings are compatible with a combination of purulent fluid and feces. The drain was irrigated with sterile saline and attached to a suction bulb. Catheter was sutured to  skin. Dressing was placed. FINDINGS: Initial CT images image demonstrated complex air-fluid collection in the right lower quadrant compatible with an abscess. The abscess was well delineated on this examination because contrast had moved into the colon compared to the previous examination. Right lower quadrant abscess is complex and required 2 separate drains for adequate decompression. Again noted is stool-like material in the presacral region around the rectal anastomosis. This stool-like material now contains high-density material which is compatible with oral contrast from the prior CT. Drain was successfully placed within the abscess/fecal collection around the anastomosis and distal colon. IMPRESSION: 1. CT-guided placement of 2 drains within the right lower quadrant abscesses. Thick yellow purulent fluid was removed from the right lower quadrant abscesses. 2. CT images confirmed oral contrast leaking into the presacral space and compatible with an anastomotic leak. 3. CT-guided placement of a drain within the presacral abscess/fecal collection. Electronically Signed   By: Markus Daft M.D.   On: 11/01/2019 21:00   CT IMAGE GUIDED DRAINAGE BY PERCUTANEOUS CATHETER  Result Date: 11/01/2019 INDICATION: 44 year old with history rectal adenocarcinoma. Patient had a low anterior resection on 10/15/2019. Patient presented with abdominal pain. CT imaging demonstrated a large abscess in the right lower quadrant and evidence for anastomotic breakdown with stool in the presacral region. EXAM: CT-GUIDED PLACEMENT OF TWO DRAINS IN THE RIGHT LOWER QUADRANT ABSCESSES CT-GUIDED PLACEMENT OF DRAIN IN PRESACRAL ABSCESS/FECAL COLLECTION MEDICATIONS: The patient is currently admitted to the hospital  and receiving intravenous antibiotics. The antibiotics were administered within an appropriate time frame prior to the initiation of the procedure. ANESTHESIA/SEDATION: 5.0 mg IV Versed 250 mcg IV Fentanyl Moderate Sedation Time:   1 hour 34 minutes The patient was continuously monitored during the procedure by the interventional radiology nurse under my direct supervision. COMPLICATIONS: None immediate. TECHNIQUE: Informed written consent was obtained from the patient after a thorough discussion of the procedural risks, benefits and alternatives. All questions were addressed. Maximal Sterile Barrier Technique was utilized including caps, mask, sterile gowns, sterile gloves, sterile drape, hand hygiene and skin antiseptic. A timeout was performed prior to the initiation of the procedure. PROCEDURE: Patient was initially placed at supine. CT images through the lower abdomen and pelvis were obtained. The abscess is in the right lower quadrant was initially targeted. The right lower abdomen was prepped with chlorhexidine and sterile field was created. Maximal barrier sterile technique was utilized including caps, mask, sterile gowns, sterile gloves, sterile drape, hand hygiene and skin antiseptic. The skin was anesthetized with 1% lidocaine. Using CT guidance, an 18 gauge trocar needle was directed into the most inferior aspect of the right lower quadrant abscess along the anterior lower abdomen. Thick yellow purulent fluid was aspirated. Superstiff Amplatz wire was advanced into the collection. The tract was dilated to accommodate a 10 [place] drain. Follow up CT images demonstrated that there was still a lateral component to the right lower quadrant abscess that was not being drained. Therefore, the right lateral abdomen was prepped with chlorhexidine and sterile field was created. Skin was anesthetized with 1% lidocaine. Using CT guidance, 18 gauge trocar needle was directed into the lateral abscess component. Thick yellow fluid was aspirated. Superstiff Amplatz wire was advanced into the collection and another 10 [place] multipurpose drain was placed. A total of 75 mL of thick yellow fluid was removed from the right lower quadrant abscesses.  Both catheters were sutured to skin and attached to suction bulbs. Dressings were placed. The patient was rolled onto his left side. The abscess/fecal collection in the presacral region was targeted. Right transgluteal approach was selected. Skin was prepped and draped in sterile fashion. Maximal barrier sterile technique was utilized including caps, mask, sterile gowns, sterile gloves, sterile drape, hand hygiene and skin antiseptic. Skin was anesthetized with 1% lidocaine. Using CT guidance, an 18 gauge trocar needle was directed into the abscess/fecal collection. Super stiff Amplatz wire was placed. However, it appeared that the wire was initially extending into the colon. As a result, this wire needle were removed. Needle was redirected slightly more lateral. The wire was extending anteriorly into the collection rather than into the colon. The tract was dilated to accommodate a 12 [place] multipurpose drain. Approximately 20 mL of thick brown foul-smelling purulent fluid was removed. Findings are compatible with a combination of purulent fluid and feces. The drain was irrigated with sterile saline and attached to a suction bulb. Catheter was sutured to skin. Dressing was placed. FINDINGS: Initial CT images image demonstrated complex air-fluid collection in the right lower quadrant compatible with an abscess. The abscess was well delineated on this examination because contrast had moved into the colon compared to the previous examination. Right lower quadrant abscess is complex and required 2 separate drains for adequate decompression. Again noted is stool-like material in the presacral region around the rectal anastomosis. This stool-like material now contains high-density material which is compatible with oral contrast from the prior CT. Drain was successfully placed within the abscess/fecal collection  around the anastomosis and distal colon. IMPRESSION: 1. CT-guided placement of 2 drains within the right lower  quadrant abscesses. Thick yellow purulent fluid was removed from the right lower quadrant abscesses. 2. CT images confirmed oral contrast leaking into the presacral space and compatible with an anastomotic leak. 3. CT-guided placement of a drain within the presacral abscess/fecal collection. Electronically Signed   By: Markus Daft M.D.   On: 11/01/2019 21:00   CT IMAGE GUIDED DRAINAGE BY PERCUTANEOUS CATHETER  Result Date: 11/01/2019 INDICATION: 44 year old with history rectal adenocarcinoma. Patient had a low anterior resection on 10/15/2019. Patient presented with abdominal pain. CT imaging demonstrated a large abscess in the right lower quadrant and evidence for anastomotic breakdown with stool in the presacral region. EXAM: CT-GUIDED PLACEMENT OF TWO DRAINS IN THE RIGHT LOWER QUADRANT ABSCESSES CT-GUIDED PLACEMENT OF DRAIN IN PRESACRAL ABSCESS/FECAL COLLECTION MEDICATIONS: The patient is currently admitted to the hospital and receiving intravenous antibiotics. The antibiotics were administered within an appropriate time frame prior to the initiation of the procedure. ANESTHESIA/SEDATION: 5.0 mg IV Versed 250 mcg IV Fentanyl Moderate Sedation Time:  1 hour 34 minutes The patient was continuously monitored during the procedure by the interventional radiology nurse under my direct supervision. COMPLICATIONS: None immediate. TECHNIQUE: Informed written consent was obtained from the patient after a thorough discussion of the procedural risks, benefits and alternatives. All questions were addressed. Maximal Sterile Barrier Technique was utilized including caps, mask, sterile gowns, sterile gloves, sterile drape, hand hygiene and skin antiseptic. A timeout was performed prior to the initiation of the procedure. PROCEDURE: Patient was initially placed at supine. CT images through the lower abdomen and pelvis were obtained. The abscess is in the right lower quadrant was initially targeted. The right lower abdomen was  prepped with chlorhexidine and sterile field was created. Maximal barrier sterile technique was utilized including caps, mask, sterile gowns, sterile gloves, sterile drape, hand hygiene and skin antiseptic. The skin was anesthetized with 1% lidocaine. Using CT guidance, an 18 gauge trocar needle was directed into the most inferior aspect of the right lower quadrant abscess along the anterior lower abdomen. Thick yellow purulent fluid was aspirated. Superstiff Amplatz wire was advanced into the collection. The tract was dilated to accommodate a 10 [place] drain. Follow up CT images demonstrated that there was still a lateral component to the right lower quadrant abscess that was not being drained. Therefore, the right lateral abdomen was prepped with chlorhexidine and sterile field was created. Skin was anesthetized with 1% lidocaine. Using CT guidance, 18 gauge trocar needle was directed into the lateral abscess component. Thick yellow fluid was aspirated. Superstiff Amplatz wire was advanced into the collection and another 10 [place] multipurpose drain was placed. A total of 75 mL of thick yellow fluid was removed from the right lower quadrant abscesses. Both catheters were sutured to skin and attached to suction bulbs. Dressings were placed. The patient was rolled onto his left side. The abscess/fecal collection in the presacral region was targeted. Right transgluteal approach was selected. Skin was prepped and draped in sterile fashion. Maximal barrier sterile technique was utilized including caps, mask, sterile gowns, sterile gloves, sterile drape, hand hygiene and skin antiseptic. Skin was anesthetized with 1% lidocaine. Using CT guidance, an 18 gauge trocar needle was directed into the abscess/fecal collection. Super stiff Amplatz wire was placed. However, it appeared that the wire was initially extending into the colon. As a result, this wire needle were removed. Needle was redirected slightly more lateral. The  wire was extending anteriorly into the collection rather than into the colon. The tract was dilated to accommodate a 12 [place] multipurpose drain. Approximately 20 mL of thick brown foul-smelling purulent fluid was removed. Findings are compatible with a combination of purulent fluid and feces. The drain was irrigated with sterile saline and attached to a suction bulb. Catheter was sutured to skin. Dressing was placed. FINDINGS: Initial CT images image demonstrated complex air-fluid collection in the right lower quadrant compatible with an abscess. The abscess was well delineated on this examination because contrast had moved into the colon compared to the previous examination. Right lower quadrant abscess is complex and required 2 separate drains for adequate decompression. Again noted is stool-like material in the presacral region around the rectal anastomosis. This stool-like material now contains high-density material which is compatible with oral contrast from the prior CT. Drain was successfully placed within the abscess/fecal collection around the anastomosis and distal colon. IMPRESSION: 1. CT-guided placement of 2 drains within the right lower quadrant abscesses. Thick yellow purulent fluid was removed from the right lower quadrant abscesses. 2. CT images confirmed oral contrast leaking into the presacral space and compatible with an anastomotic leak. 3. CT-guided placement of a drain within the presacral abscess/fecal collection. Electronically Signed   By: Markus Daft M.D.   On: 11/01/2019 21:00    Labs:  CBC: Recent Labs    10/19/19 0906 10/21/19 0255 10/31/19 1750 11/02/19 0430  WBC 10.7* 10.6* 12.4* 12.0*  HGB 14.1 11.6* 11.3* 9.9*  HCT 41.9 35.5* 34.3* 30.6*  PLT 271 278 738* 569*    COAGS: Recent Labs    11/01/19 0731  INR 1.2    BMP: Recent Labs    10/16/19 0445 10/16/19 0445 10/17/19 0418 10/19/19 0906 10/31/19 1750 11/02/19 0430  NA 136  --  138 133* 137  --   K  4.4   < > 3.9 4.4 3.8 4.0  CL 103  --  103 98 101  --   CO2 26  --  27 25 24   --   GLUCOSE 134*  --  116* 134* 102*  --   BUN 8  --  11 13 11   --   CALCIUM 8.7*  --  8.6* 8.7* 8.5*  --   CREATININE 0.92   < > 1.00 0.87 0.77 0.99  GFRNONAA >60   < > >60 >60 >60 >60  GFRAA >60   < > >60 >60 >60 >60   < > = values in this interval not displayed.    LIVER FUNCTION TESTS: Recent Labs    06/30/19 0855 07/10/19 0805 07/24/19 1444 08/11/19 1050  BILITOT 0.4 0.6 0.8 0.5  AST 38 33 26 41  ALT 53* 46* 32 52*  ALKPHOS 154* 131* 92 125  PROT 6.6 6.4* 6.3* 6.7  ALBUMIN 3.9 3.9 3.9 3.9    Assessment and Plan:  44 y/o M with history of colorectal cancer sp robotic assisted low anterior resection 10/15/19 with subsequent development of multiple intra-abdominal abscesses. IR placed abdominal drains x 3 yesterday and he was seen today for follow up of those drains.  Patient sleeping on exam today but does wake up easily and tell me that his pain is much better. RLQ drains x 2 with purulent appearing output, flush and aspirate easily. Per I/O drain #1 with 110 cc output and drain #2 with 53 cc output since placement. Presacral drain with feculent appearing output in suction bulb, did not flush today as  patient was sleeping. Per I/O this drain with 22 cc of output since placement. Tmax 100, WBC 12.0, hgb 9.9, plt 569. Preliminary culture from RLQ aspirate shows moderate gram positive cocci in pairs in chains with few yeast - currently receiving Eraxis + Zosyn per primary team.  Continue TID flushes with 5 cc NS, record output Qshift, dressing changes QD or PRN if soiled, call IR if difficulty flushing or sudden decrease in output.   Plan for repeat CT/possible injection once output <10 mL/QD (excluding flush) - if patient is to be discharged prior to this occurring they will need to follow up with IR clinic as an outpatient typically 10-14 days post d/c, IR scheduler will call patient with date/time.  Upon d/c drain to be flushed QD with 5 cc NS (will need rx from primary team at d/c), record output QD, dressing changes every 2-3 days or sooner if soiled.   IR will continue to follow - please call with questions or concerns.  Electronically Signed: Joaquim Nam, PA-C 11/02/2019, 11:29 AM   I spent a total of 15 Minutes at the the patient's bedside AND on the patient's hospital floor or unit, greater than 50% of which was counseling/coordinating care for abdominal abscess drains x 3.

## 2019-11-03 MED ORDER — ENSURE MAX PROTEIN PO LIQD
11.0000 [oz_av] | Freq: Two times a day (BID) | ORAL | Status: DC
  Administered 2019-11-03 – 2019-11-06 (×8): 11 [oz_av] via ORAL

## 2019-11-03 MED ORDER — SODIUM CHLORIDE 0.9 % IV SOLN
INTRAVENOUS | Status: DC | PRN
  Administered 2019-11-03 – 2019-11-07 (×2): 250 mL via INTRAVENOUS

## 2019-11-03 NOTE — Progress Notes (Signed)
Referring Physician(s): Carlena Hurl  Supervising Physician: Aletta Edouard  Patient Status:  [facility] - In-pt  Chief Complaint: Abdominal abscess. S/P drain placement x3 by Dr. Anselm Pancoast on 11/01/19.   Subjective: Patient in bed, wife in the room. Pain is better managed, states he is ambulating and tolerating a full liquid diet.    Allergies: Patient has no known allergies.  Medications: Prior to Admission medications   Medication Sig Start Date End Date Taking? Authorizing Provider  acetaminophen (TYLENOL) 500 MG tablet Take 500 mg by mouth every 6 (six) hours as needed for moderate pain.   Yes [provider]  gabapentin (NEURONTIN) 300 MG capsule Take 2 capsules (600 mg total) by mouth 2 (two) times daily. Patient taking differently: Take 600 mg by mouth 2 (two) times daily as needed (pain).  09/04/19  Yes Owens Shark, NP  ibuprofen (ADVIL) 200 MG tablet Take 400 mg by mouth every 6 (six) hours as needed for moderate pain.   Yes [provider]  LORazepam (ATIVAN) 0.5 MG tablet Take 1 tablet (0.5 mg total) by mouth every 8 (eight) hours as needed for anxiety. Or nausea. Do not drive while taking [id number]  Yes Owens Shark, NP  oxyCODONE (OXY IR/ROXICODONE) 5 MG immediate release tablet Take 1-2 tablets (5-10 mg total) by mouth every 6 (six) hours as needed for moderate pain, severe pain or breakthrough pain. [id number]  Yes Leighton Ruff, MD  lidocaine-prilocaine (EMLA) cream Apply 1 application topically as directed. Apply 1 hour prior to stick and cover with plastic wrap Patient not taking: Reported on 09/25/2019 07/10/19   Ladell Pier, MD     Vital Signs: BP 125/72 (BP Location: Left Arm)   Pulse 78   Temp 99.7 F (37.6 C) (Oral)   Resp 16   Ht 6' (1.829 m)   Wt 160 lb (72.6 kg)   SpO2 95%   BMI 21.70 kg/m   Physical Exam Constitutional:      General: He is not in acute distress. Pulmonary:     Effort: Pulmonary effort is normal.  Abdominal:      General: Abdomen is flat.     Tenderness: There is abdominal tenderness.     Comments: Two RLQ drains and one sacral drain, each with 10-15 ml purulent fluid. Dressings are all clean, dry and intact. Skin around insertion sites are all clean and dry without redness or swelling. Moderate tenderness around each site with light palpation.   Skin:    General: Skin is warm and dry.  Neurological:     Mental Status: He is alert and oriented to person, place, and time.  Psychiatric:        Mood and Affect: Mood normal.        Behavior: Behavior normal.     Imaging: CT ABDOMEN PELVIS W CONTRAST  Result Date: 10/31/2019 CLINICAL DATA:  Rectal cancer diagnosed in 2020. Chemotherapy and radiation treatment completed. Patient reports a LOWER resection on 10/15/2019. Still having a lot of pain. Difficulty lying on the table for the exam. EXAM: CT ABDOMEN AND PELVIS WITH CONTRAST TECHNIQUE: Multidetector CT imaging of the abdomen and pelvis was performed using the standard protocol following bolus administration of intravenous contrast. CONTRAST:  111mL OMNIPAQUE IOHEXOL 300 MG/ML  SOLN COMPARISON:  08/11/2019 FINDINGS: Lower chest: RIGHT LOWER lobe atelectasis. Heart size is normal. Hepatobiliary: Stable appearance of 3.5 centimeter hemangioma in the posterior segment of the RIGHT hepatic lobe. An 8 millimeter nodule is identified more  inferiorly in the RIGHT hepatic lobe, best seen on image 26 of series 2. The appearance is stable. Gallbladder is present and unremarkable. Pancreas: Unremarkable. No pancreatic ductal dilatation or surrounding inflammatory changes. Spleen: Normal in size without focal abnormality. Adrenals/Urinary Tract: Adrenal glands are normal. No hydronephrosis. Ureters are unremarkable. Urinary bladder wall is thickened. There is air within the lumen of the urinary bladder consistent with infectious process or recent instrumentation. Stomach/Bowel: The stomach is normal in appearance.  Proximal jejunum is unremarkable. There is dilatation and thickening of the mid small bowel. The distal small bowel loops show thickened wall but no dilatation. There is an abscess in the RIGHT LOWER QUADRANT, just inferior to the cecum. Abscess has 2 components, measuring 9.1 x 5.1 and 6.9 x 5.0 centimeters. Small amount of air is identified within this abscess. The rectal anastomosis is surrounded by loculated stool and air, consistent with anastomotic breakdown. There is rectal wall thickening. A tubular gas filled structure extends from the region of the anastomosis into LEFT LOWER QUADRANT and is favored to represent air within a surgical drain site. Vascular/Lymphatic: No significant vascular findings are present. No enlarged abdominal or pelvic lymph nodes. Reproductive: Prostate is unremarkable. Other: Anterior abdominal wall unremarkable. Musculoskeletal: No acute or significant osseous findings. IMPRESSION: 1. Anastomotic breakdown at the rectal anastomosis. Extraluminal stool is present in the region of the anastomosis. 2. Abscess in the RIGHT LOWER QUADRANT measuring 9.1 x 5.1 and 6.9 centimeters. 3. Small bowel obstruction. 4. Air within the lumen of the urinary bladder consistent with infectious process or recent instrumentation. 5. Stable appearance of hepatic hemangioma. 6. Stable appearance of subcentimeter nodule in the RIGHT hepatic lobe, likely benign. Critical Value/emergent results were called by telephone at the time of interpretation on 10/31/2019 at 3:18 pm to provider Carlena Hurl , who verbally acknowledged these results. Electronically Signed   By: Nolon Nations M.D.   On: 10/31/2019 15:52   CT IMAGE GUIDED DRAINAGE BY PERCUTANEOUS CATHETER  Result Date: 11/01/2019 INDICATION: 44 year old with history rectal adenocarcinoma. Patient had a low anterior resection on 10/15/2019. Patient presented with abdominal pain. CT imaging demonstrated a large abscess in the right lower quadrant and  evidence for anastomotic breakdown with stool in the presacral region. EXAM: CT-GUIDED PLACEMENT OF TWO DRAINS IN THE RIGHT LOWER QUADRANT ABSCESSES CT-GUIDED PLACEMENT OF DRAIN IN PRESACRAL ABSCESS/FECAL COLLECTION MEDICATIONS: The patient is currently admitted to the hospital and receiving intravenous antibiotics. The antibiotics were administered within an appropriate time frame prior to the initiation of the procedure. ANESTHESIA/SEDATION: 5.0 mg IV Versed 250 mcg IV Fentanyl Moderate Sedation Time:  1 hour 34 minutes The patient was continuously monitored during the procedure by the interventional radiology nurse under my direct supervision. COMPLICATIONS: None immediate. TECHNIQUE: Informed written consent was obtained from the patient after a thorough discussion of the procedural risks, benefits and alternatives. All questions were addressed. Maximal Sterile Barrier Technique was utilized including caps, mask, sterile gowns, sterile gloves, sterile drape, hand hygiene and skin antiseptic. A timeout was performed prior to the initiation of the procedure. PROCEDURE: Patient was initially placed at supine. CT images through the lower abdomen and pelvis were obtained. The abscess is in the right lower quadrant was initially targeted. The right lower abdomen was prepped with chlorhexidine and sterile field was created. Maximal barrier sterile technique was utilized including caps, mask, sterile gowns, sterile gloves, sterile drape, hand hygiene and skin antiseptic. The skin was anesthetized with 1% lidocaine. Using CT guidance, an 18 gauge  trocar needle was directed into the most inferior aspect of the right lower quadrant abscess along the anterior lower abdomen. Thick yellow purulent fluid was aspirated. Superstiff Amplatz wire was advanced into the collection. The tract was dilated to accommodate a 10 [place] drain. Follow up CT images demonstrated that there was still a lateral component to the right lower  quadrant abscess that was not being drained. Therefore, the right lateral abdomen was prepped with chlorhexidine and sterile field was created. Skin was anesthetized with 1% lidocaine. Using CT guidance, 18 gauge trocar needle was directed into the lateral abscess component. Thick yellow fluid was aspirated. Superstiff Amplatz wire was advanced into the collection and another 10 [place] multipurpose drain was placed. A total of 75 mL of thick yellow fluid was removed from the right lower quadrant abscesses. Both catheters were sutured to skin and attached to suction bulbs. Dressings were placed. The patient was rolled onto his left side. The abscess/fecal collection in the presacral region was targeted. Right transgluteal approach was selected. Skin was prepped and draped in sterile fashion. Maximal barrier sterile technique was utilized including caps, mask, sterile gowns, sterile gloves, sterile drape, hand hygiene and skin antiseptic. Skin was anesthetized with 1% lidocaine. Using CT guidance, an 18 gauge trocar needle was directed into the abscess/fecal collection. Super stiff Amplatz wire was placed. However, it appeared that the wire was initially extending into the colon. As a result, this wire needle were removed. Needle was redirected slightly more lateral. The wire was extending anteriorly into the collection rather than into the colon. The tract was dilated to accommodate a 12 [place] multipurpose drain. Approximately 20 mL of thick brown foul-smelling purulent fluid was removed. Findings are compatible with a combination of purulent fluid and feces. The drain was irrigated with sterile saline and attached to a suction bulb. Catheter was sutured to skin. Dressing was placed. FINDINGS: Initial CT images image demonstrated complex air-fluid collection in the right lower quadrant compatible with an abscess. The abscess was well delineated on this examination because contrast had moved into the colon compared  to the previous examination. Right lower quadrant abscess is complex and required 2 separate drains for adequate decompression. Again noted is stool-like material in the presacral region around the rectal anastomosis. This stool-like material now contains high-density material which is compatible with oral contrast from the prior CT. Drain was successfully placed within the abscess/fecal collection around the anastomosis and distal colon. IMPRESSION: 1. CT-guided placement of 2 drains within the right lower quadrant abscesses. Thick yellow purulent fluid was removed from the right lower quadrant abscesses. 2. CT images confirmed oral contrast leaking into the presacral space and compatible with an anastomotic leak. 3. CT-guided placement of a drain within the presacral abscess/fecal collection. Electronically Signed   By: Markus Daft M.D.   On: 11/01/2019 21:00   CT IMAGE GUIDED DRAINAGE BY PERCUTANEOUS CATHETER  Result Date: 11/01/2019 INDICATION: 44 year old with history rectal adenocarcinoma. Patient had a low anterior resection on 10/15/2019. Patient presented with abdominal pain. CT imaging demonstrated a large abscess in the right lower quadrant and evidence for anastomotic breakdown with stool in the presacral region. EXAM: CT-GUIDED PLACEMENT OF TWO DRAINS IN THE RIGHT LOWER QUADRANT ABSCESSES CT-GUIDED PLACEMENT OF DRAIN IN PRESACRAL ABSCESS/FECAL COLLECTION MEDICATIONS: The patient is currently admitted to the hospital and receiving intravenous antibiotics. The antibiotics were administered within an appropriate time frame prior to the initiation of the procedure. ANESTHESIA/SEDATION: 5.0 mg IV Versed 250 mcg IV Fentanyl Moderate  Sedation Time:  1 hour 34 minutes The patient was continuously monitored during the procedure by the interventional radiology nurse under my direct supervision. COMPLICATIONS: None immediate. TECHNIQUE: Informed written consent was obtained from the patient after a thorough  discussion of the procedural risks, benefits and alternatives. All questions were addressed. Maximal Sterile Barrier Technique was utilized including caps, mask, sterile gowns, sterile gloves, sterile drape, hand hygiene and skin antiseptic. A timeout was performed prior to the initiation of the procedure. PROCEDURE: Patient was initially placed at supine. CT images through the lower abdomen and pelvis were obtained. The abscess is in the right lower quadrant was initially targeted. The right lower abdomen was prepped with chlorhexidine and sterile field was created. Maximal barrier sterile technique was utilized including caps, mask, sterile gowns, sterile gloves, sterile drape, hand hygiene and skin antiseptic. The skin was anesthetized with 1% lidocaine. Using CT guidance, an 18 gauge trocar needle was directed into the most inferior aspect of the right lower quadrant abscess along the anterior lower abdomen. Thick yellow purulent fluid was aspirated. Superstiff Amplatz wire was advanced into the collection. The tract was dilated to accommodate a 10 [place] drain. Follow up CT images demonstrated that there was still a lateral component to the right lower quadrant abscess that was not being drained. Therefore, the right lateral abdomen was prepped with chlorhexidine and sterile field was created. Skin was anesthetized with 1% lidocaine. Using CT guidance, 18 gauge trocar needle was directed into the lateral abscess component. Thick yellow fluid was aspirated. Superstiff Amplatz wire was advanced into the collection and another 10 [place] multipurpose drain was placed. A total of 75 mL of thick yellow fluid was removed from the right lower quadrant abscesses. Both catheters were sutured to skin and attached to suction bulbs. Dressings were placed. The patient was rolled onto his left side. The abscess/fecal collection in the presacral region was targeted. Right transgluteal approach was selected. Skin was prepped  and draped in sterile fashion. Maximal barrier sterile technique was utilized including caps, mask, sterile gowns, sterile gloves, sterile drape, hand hygiene and skin antiseptic. Skin was anesthetized with 1% lidocaine. Using CT guidance, an 18 gauge trocar needle was directed into the abscess/fecal collection. Super stiff Amplatz wire was placed. However, it appeared that the wire was initially extending into the colon. As a result, this wire needle were removed. Needle was redirected slightly more lateral. The wire was extending anteriorly into the collection rather than into the colon. The tract was dilated to accommodate a 12 [place] multipurpose drain. Approximately 20 mL of thick brown foul-smelling purulent fluid was removed. Findings are compatible with a combination of purulent fluid and feces. The drain was irrigated with sterile saline and attached to a suction bulb. Catheter was sutured to skin. Dressing was placed. FINDINGS: Initial CT images image demonstrated complex air-fluid collection in the right lower quadrant compatible with an abscess. The abscess was well delineated on this examination because contrast had moved into the colon compared to the previous examination. Right lower quadrant abscess is complex and required 2 separate drains for adequate decompression. Again noted is stool-like material in the presacral region around the rectal anastomosis. This stool-like material now contains high-density material which is compatible with oral contrast from the prior CT. Drain was successfully placed within the abscess/fecal collection around the anastomosis and distal colon. IMPRESSION: 1. CT-guided placement of 2 drains within the right lower quadrant abscesses. Thick yellow purulent fluid was removed from the right lower quadrant  abscesses. 2. CT images confirmed oral contrast leaking into the presacral space and compatible with an anastomotic leak. 3. CT-guided placement of a drain within the  presacral abscess/fecal collection. Electronically Signed   By: Markus Daft M.D.   On: 11/01/2019 21:00   CT IMAGE GUIDED DRAINAGE BY PERCUTANEOUS CATHETER  Result Date: 11/01/2019 INDICATION: 44 year old with history rectal adenocarcinoma. Patient had a low anterior resection on 10/15/2019. Patient presented with abdominal pain. CT imaging demonstrated a large abscess in the right lower quadrant and evidence for anastomotic breakdown with stool in the presacral region. EXAM: CT-GUIDED PLACEMENT OF TWO DRAINS IN THE RIGHT LOWER QUADRANT ABSCESSES CT-GUIDED PLACEMENT OF DRAIN IN PRESACRAL ABSCESS/FECAL COLLECTION MEDICATIONS: The patient is currently admitted to the hospital and receiving intravenous antibiotics. The antibiotics were administered within an appropriate time frame prior to the initiation of the procedure. ANESTHESIA/SEDATION: 5.0 mg IV Versed 250 mcg IV Fentanyl Moderate Sedation Time:  1 hour 34 minutes The patient was continuously monitored during the procedure by the interventional radiology nurse under my direct supervision. COMPLICATIONS: None immediate. TECHNIQUE: Informed written consent was obtained from the patient after a thorough discussion of the procedural risks, benefits and alternatives. All questions were addressed. Maximal Sterile Barrier Technique was utilized including caps, mask, sterile gowns, sterile gloves, sterile drape, hand hygiene and skin antiseptic. A timeout was performed prior to the initiation of the procedure. PROCEDURE: Patient was initially placed at supine. CT images through the lower abdomen and pelvis were obtained. The abscess is in the right lower quadrant was initially targeted. The right lower abdomen was prepped with chlorhexidine and sterile field was created. Maximal barrier sterile technique was utilized including caps, mask, sterile gowns, sterile gloves, sterile drape, hand hygiene and skin antiseptic. The skin was anesthetized with 1% lidocaine. Using  CT guidance, an 18 gauge trocar needle was directed into the most inferior aspect of the right lower quadrant abscess along the anterior lower abdomen. Thick yellow purulent fluid was aspirated. Superstiff Amplatz wire was advanced into the collection. The tract was dilated to accommodate a 10 [place] drain. Follow up CT images demonstrated that there was still a lateral component to the right lower quadrant abscess that was not being drained. Therefore, the right lateral abdomen was prepped with chlorhexidine and sterile field was created. Skin was anesthetized with 1% lidocaine. Using CT guidance, 18 gauge trocar needle was directed into the lateral abscess component. Thick yellow fluid was aspirated. Superstiff Amplatz wire was advanced into the collection and another 10 [place] multipurpose drain was placed. A total of 75 mL of thick yellow fluid was removed from the right lower quadrant abscesses. Both catheters were sutured to skin and attached to suction bulbs. Dressings were placed. The patient was rolled onto his left side. The abscess/fecal collection in the presacral region was targeted. Right transgluteal approach was selected. Skin was prepped and draped in sterile fashion. Maximal barrier sterile technique was utilized including caps, mask, sterile gowns, sterile gloves, sterile drape, hand hygiene and skin antiseptic. Skin was anesthetized with 1% lidocaine. Using CT guidance, an 18 gauge trocar needle was directed into the abscess/fecal collection. Super stiff Amplatz wire was placed. However, it appeared that the wire was initially extending into the colon. As a result, this wire needle were removed. Needle was redirected slightly more lateral. The wire was extending anteriorly into the collection rather than into the colon. The tract was dilated to accommodate a 12 [place] multipurpose drain. Approximately 20 mL of thick brown foul-smelling  purulent fluid was removed. Findings are compatible with a  combination of purulent fluid and feces. The drain was irrigated with sterile saline and attached to a suction bulb. Catheter was sutured to skin. Dressing was placed. FINDINGS: Initial CT images image demonstrated complex air-fluid collection in the right lower quadrant compatible with an abscess. The abscess was well delineated on this examination because contrast had moved into the colon compared to the previous examination. Right lower quadrant abscess is complex and required 2 separate drains for adequate decompression. Again noted is stool-like material in the presacral region around the rectal anastomosis. This stool-like material now contains high-density material which is compatible with oral contrast from the prior CT. Drain was successfully placed within the abscess/fecal collection around the anastomosis and distal colon. IMPRESSION: 1. CT-guided placement of 2 drains within the right lower quadrant abscesses. Thick yellow purulent fluid was removed from the right lower quadrant abscesses. 2. CT images confirmed oral contrast leaking into the presacral space and compatible with an anastomotic leak. 3. CT-guided placement of a drain within the presacral abscess/fecal collection. Electronically Signed   By: Markus Daft M.D.   On: 11/01/2019 21:00    Labs:  CBC: Recent Labs    10/19/19 0906 10/21/19 0255 10/31/19 1750 11/02/19 0430  WBC 10.7* 10.6* 12.4* 12.0*  HGB 14.1 11.6* 11.3* 9.9*  HCT 41.9 35.5* 34.3* 30.6*  PLT 271 278 738* 569*    COAGS: Recent Labs    11/01/19 0731  INR 1.2    BMP: Recent Labs    10/16/19 0445 10/16/19 0445 10/17/19 0418 10/19/19 0906 10/31/19 1750 11/02/19 0430  NA 136  --  138 133* 137  --   K 4.4   < > 3.9 4.4 3.8 4.0  CL 103  --  103 98 101  --   CO2 26  --  27 25 24   --   GLUCOSE 134*  --  116* 134* 102*  --   BUN 8  --  11 13 11   --   CALCIUM 8.7*  --  8.6* 8.7* 8.5*  --   CREATININE 0.92   < > 1.00 0.87 0.77 0.99  GFRNONAA >60   < >  >60 >60 >60 >60  GFRAA >60   < > >60 >60 >60 >60   < > = values in this interval not displayed.    LIVER FUNCTION TESTS: Recent Labs    06/30/19 0855 07/10/19 0805 07/24/19 1444 08/11/19 1050  BILITOT 0.4 0.6 0.8 0.5  AST 38 33 26 41  ALT 53* 46* 32 52*  ALKPHOS 154* 131* 92 125  PROT 6.6 6.4* 6.3* 6.7  ALBUMIN 3.9 3.9 3.9 3.9    Assessment and Plan: 44 y/o M with history of colorectal cancer sp robotic assisted low anterior resection 10/15/19 with subsequent development of multiple intra-abdominal abscesses. IR placed abdominal drains x 3 11/01/19 by Dr. Anselm Pancoast and he was seen today for follow up of those drains. Drains #1 and #2 with 10 cc output in the past 24 hours. Drain #3 with 5 cc output in the past 24 hours.   Continue TID flushes with 5 cc NS, record output every shift and continue daily/PRN dressing changes.   Plan for repeat CT/possible injection once output <10 mL/QD (excluding flush) - if patient is to be discharged prior to this occurring they will need to follow up with IR clinic as an outpatient typically 10-14 days post d/c, IR scheduler will call patient with date/time.  Upon d/c drain to be flushed QD with 5 cc NS (will need rx from primary team at d/c), record output QD, dressing changes every 2-3 days or sooner if soiled.   IR will continue to follow - please call with questions or concerns.    Electronically Signed: Theresa Duty, NP 11/03/2019, 12:08 PM   I spent a total of 15 Minutes at the the patient's bedside AND on the patient's hospital floor or unit, greater than 50% of which was counseling/coordinating care for abdominal abscess drains x3.

## 2019-11-03 NOTE — Progress Notes (Signed)
Postprocedural intraabdominal abscess  Subjective: Feels much better than he did on Fri.  Hungry.  Tolerating clears  Objective: Vital signs in last 24 hours: Temp:  [98.1 F (36.7 C)-100 F (37.8 C)] 99.7 F (37.6 C) (05/24 0605) Pulse Rate:  [65-88] 78 (05/24 0605) Resp:  [16-19] 16 (05/24 0605) BP: (117-133)/(72-85) 125/72 (05/24 0605) SpO2:  [94 %-100 %] 95 % (05/24 0605) Last BM Date: 11/02/19  Intake/Output from previous day: 05/23 0701 - 05/24 0700 In: 2181.2 [P.O.:810; I.V.:1089.4; IV Piggyback:281.8] Out: 876 [Urine:850; Drains:25; Stool:1] Intake/Output this shift: No intake/output data recorded.  General appearance: alert and cooperative GI: normal findings: soft, non-distended, appropriately tender Incision/Wound:  Lab Results:  No results found for this or any previous visit (from the past 24 hour(s)).   Studies/Results Radiology     MEDS, Scheduled . acetaminophen  500 mg Oral TID WC & HS  . Chlorhexidine Gluconate Cloth  6 each Topical Daily  . enoxaparin (LOVENOX) injection  40 mg Subcutaneous Q24H  . gabapentin  600 mg Oral TID  . lip balm  1 application Topical BID  . metoprolol tartrate  12.5 mg Oral BID  . Ensure Max Protein  11 oz Oral BID  . sodium chloride flush  5 mL Intracatheter Q8H     Assessment: Postprocedural intraabdominal abscess Cont IV abx, Drains  Plan: Will try to advance to fulls today Increase fluids to help with dehydration Ambulate in hall [facility] to shower if drains are covered  LOS: 2 days    Rosario Adie, MD [facility], [place]    11/03/2019 8:55 AM

## 2019-11-04 MED ORDER — DIAZEPAM 2 MG PO TABS
2.0000 mg | ORAL_TABLET | Freq: Four times a day (QID) | ORAL | Status: DC | PRN
  Administered 2019-11-04 – 2019-12-02 (×25): 2 mg via ORAL
  Filled 2019-11-04 (×25): qty 1

## 2019-11-04 NOTE — Progress Notes (Signed)
Posterior buttock drain does not seem to want to hold a charge.

## 2019-11-04 NOTE — Progress Notes (Signed)
Referring Physician(s): [place]  Supervising Physician: Jacqulynn Cadet  Patient Status:  [facility]  Chief Complaint: Abdominal/pelvic pain/abscesses   Subjective: Pt still having some abd/pelvic discomfort, esp at TG site but feels better than prior to drain placements; remains on liquid diet   Allergies: Patient has no known allergies.  Medications: Prior to Admission medications   Medication Sig Start Date End Date Taking? Authorizing Provider  acetaminophen (TYLENOL) 500 MG tablet Take 500 mg by mouth every 6 (six) hours as needed for moderate pain.   Yes [provider]  gabapentin (NEURONTIN) 300 MG capsule Take 2 capsules (600 mg total) by mouth 2 (two) times daily. Patient taking differently: Take 600 mg by mouth 2 (two) times daily as needed (pain).  09/04/19  Yes Owens Shark, NP  ibuprofen (ADVIL) 200 MG tablet Take 400 mg by mouth every 6 (six) hours as needed for moderate pain.   Yes [provider]  LORazepam (ATIVAN) 0.5 MG tablet Take 1 tablet (0.5 mg total) by mouth every 8 (eight) hours as needed for anxiety. Or nausea. Do not drive while taking [id number]  Yes Owens Shark, NP  oxyCODONE (OXY IR/ROXICODONE) 5 MG immediate release tablet Take 1-2 tablets (5-10 mg total) by mouth every 6 (six) hours as needed for moderate pain, severe pain or breakthrough pain. [id number]  Yes Leighton Ruff, MD  lidocaine-prilocaine (EMLA) cream Apply 1 application topically as directed. Apply 1 hour prior to stick and cover with plastic wrap Patient not taking: Reported on 09/25/2019 07/10/19   Ladell Pier, MD     Vital Signs: BP 124/81 (BP Location: Left Arm)   Pulse 74   Temp 98.2 F (36.8 C) (Oral)   Resp 16   Ht 6' (1.829 m)   Wt 160 lb (72.6 kg)   SpO2 98%   BMI 21.70 kg/m   Physical Exam awake/alert; all drains intact, OP ranges from 7-10 cc each, turbid beige fluid in ant drains, green feculent fluid in TG drain; all drains flushed,  likely fistula with TG drain since feculent  Imaging: CT ABDOMEN PELVIS W CONTRAST  Result Date: 10/31/2019 CLINICAL DATA:  Rectal cancer diagnosed in 2020. Chemotherapy and radiation treatment completed. Patient reports a LOWER resection on 10/15/2019. Still having a lot of pain. Difficulty lying on the table for the exam. EXAM: CT ABDOMEN AND PELVIS WITH CONTRAST TECHNIQUE: Multidetector CT imaging of the abdomen and pelvis was performed using the standard protocol following bolus administration of intravenous contrast. CONTRAST:  11mL OMNIPAQUE IOHEXOL 300 MG/ML  SOLN COMPARISON:  08/11/2019 FINDINGS: Lower chest: RIGHT LOWER lobe atelectasis. Heart size is normal. Hepatobiliary: Stable appearance of 3.5 centimeter hemangioma in the posterior segment of the RIGHT hepatic lobe. An 8 millimeter nodule is identified more inferiorly in the RIGHT hepatic lobe, best seen on image 26 of series 2. The appearance is stable. Gallbladder is present and unremarkable. Pancreas: Unremarkable. No pancreatic ductal dilatation or surrounding inflammatory changes. Spleen: Normal in size without focal abnormality. Adrenals/Urinary Tract: Adrenal glands are normal. No hydronephrosis. Ureters are unremarkable. Urinary bladder wall is thickened. There is air within the lumen of the urinary bladder consistent with infectious process or recent instrumentation. Stomach/Bowel: The stomach is normal in appearance. Proximal jejunum is unremarkable. There is dilatation and thickening of the mid small bowel. The distal small bowel loops show thickened wall but no dilatation. There is an abscess in the RIGHT LOWER QUADRANT, just inferior to the cecum. Abscess has 2  components, measuring 9.1 x 5.1 and 6.9 x 5.0 centimeters. Small amount of air is identified within this abscess. The rectal anastomosis is surrounded by loculated stool and air, consistent with anastomotic breakdown. There is rectal wall thickening. A tubular gas filled  structure extends from the region of the anastomosis into LEFT LOWER QUADRANT and is favored to represent air within a surgical drain site. Vascular/Lymphatic: No significant vascular findings are present. No enlarged abdominal or pelvic lymph nodes. Reproductive: Prostate is unremarkable. Other: Anterior abdominal wall unremarkable. Musculoskeletal: No acute or significant osseous findings. IMPRESSION: 1. Anastomotic breakdown at the rectal anastomosis. Extraluminal stool is present in the region of the anastomosis. 2. Abscess in the RIGHT LOWER QUADRANT measuring 9.1 x 5.1 and 6.9 centimeters. 3. Small bowel obstruction. 4. Air within the lumen of the urinary bladder consistent with infectious process or recent instrumentation. 5. Stable appearance of hepatic hemangioma. 6. Stable appearance of subcentimeter nodule in the RIGHT hepatic lobe, likely benign. Critical Value/emergent results were called by telephone at the time of interpretation on 10/31/2019 at 3:18 pm to provider Carlena Hurl , who verbally acknowledged these results. Electronically Signed   By: Nolon Nations M.D.   On: 10/31/2019 15:52   CT IMAGE GUIDED DRAINAGE BY PERCUTANEOUS CATHETER  Result Date: 11/01/2019 INDICATION: 44 year old with history rectal adenocarcinoma. Patient had a low anterior resection on 10/15/2019. Patient presented with abdominal pain. CT imaging demonstrated a large abscess in the right lower quadrant and evidence for anastomotic breakdown with stool in the presacral region. EXAM: CT-GUIDED PLACEMENT OF TWO DRAINS IN THE RIGHT LOWER QUADRANT ABSCESSES CT-GUIDED PLACEMENT OF DRAIN IN PRESACRAL ABSCESS/FECAL COLLECTION MEDICATIONS: The patient is currently admitted to the hospital and receiving intravenous antibiotics. The antibiotics were administered within an appropriate time frame prior to the initiation of the procedure. ANESTHESIA/SEDATION: 5.0 mg IV Versed 250 mcg IV Fentanyl Moderate Sedation Time:  1 hour 34  minutes The patient was continuously monitored during the procedure by the interventional radiology nurse under my direct supervision. COMPLICATIONS: None immediate. TECHNIQUE: Informed written consent was obtained from the patient after a thorough discussion of the procedural risks, benefits and alternatives. All questions were addressed. Maximal Sterile Barrier Technique was utilized including caps, mask, sterile gowns, sterile gloves, sterile drape, hand hygiene and skin antiseptic. A timeout was performed prior to the initiation of the procedure. PROCEDURE: Patient was initially placed at supine. CT images through the lower abdomen and pelvis were obtained. The abscess is in the right lower quadrant was initially targeted. The right lower abdomen was prepped with chlorhexidine and sterile field was created. Maximal barrier sterile technique was utilized including caps, mask, sterile gowns, sterile gloves, sterile drape, hand hygiene and skin antiseptic. The skin was anesthetized with 1% lidocaine. Using CT guidance, an 18 gauge trocar needle was directed into the most inferior aspect of the right lower quadrant abscess along the anterior lower abdomen. Thick yellow purulent fluid was aspirated. Superstiff Amplatz wire was advanced into the collection. The tract was dilated to accommodate a 10 [place] drain. Follow up CT images demonstrated that there was still a lateral component to the right lower quadrant abscess that was not being drained. Therefore, the right lateral abdomen was prepped with chlorhexidine and sterile field was created. Skin was anesthetized with 1% lidocaine. Using CT guidance, 18 gauge trocar needle was directed into the lateral abscess component. Thick yellow fluid was aspirated. Superstiff Amplatz wire was advanced into the collection and another 10 [place] multipurpose drain was placed.  A total of 75 mL of thick yellow fluid was removed from the right lower quadrant abscesses. Both  catheters were sutured to skin and attached to suction bulbs. Dressings were placed. The patient was rolled onto his left side. The abscess/fecal collection in the presacral region was targeted. Right transgluteal approach was selected. Skin was prepped and draped in sterile fashion. Maximal barrier sterile technique was utilized including caps, mask, sterile gowns, sterile gloves, sterile drape, hand hygiene and skin antiseptic. Skin was anesthetized with 1% lidocaine. Using CT guidance, an 18 gauge trocar needle was directed into the abscess/fecal collection. Super stiff Amplatz wire was placed. However, it appeared that the wire was initially extending into the colon. As a result, this wire needle were removed. Needle was redirected slightly more lateral. The wire was extending anteriorly into the collection rather than into the colon. The tract was dilated to accommodate a 12 [place] multipurpose drain. Approximately 20 mL of thick brown foul-smelling purulent fluid was removed. Findings are compatible with a combination of purulent fluid and feces. The drain was irrigated with sterile saline and attached to a suction bulb. Catheter was sutured to skin. Dressing was placed. FINDINGS: Initial CT images image demonstrated complex air-fluid collection in the right lower quadrant compatible with an abscess. The abscess was well delineated on this examination because contrast had moved into the colon compared to the previous examination. Right lower quadrant abscess is complex and required 2 separate drains for adequate decompression. Again noted is stool-like material in the presacral region around the rectal anastomosis. This stool-like material now contains high-density material which is compatible with oral contrast from the prior CT. Drain was successfully placed within the abscess/fecal collection around the anastomosis and distal colon. IMPRESSION: 1. CT-guided placement of 2 drains within the right lower  quadrant abscesses. Thick yellow purulent fluid was removed from the right lower quadrant abscesses. 2. CT images confirmed oral contrast leaking into the presacral space and compatible with an anastomotic leak. 3. CT-guided placement of a drain within the presacral abscess/fecal collection. Electronically Signed   By: Markus Daft M.D.   On: 11/01/2019 21:00   CT IMAGE GUIDED DRAINAGE BY PERCUTANEOUS CATHETER  Result Date: 11/01/2019 INDICATION: 44 year old with history rectal adenocarcinoma. Patient had a low anterior resection on 10/15/2019. Patient presented with abdominal pain. CT imaging demonstrated a large abscess in the right lower quadrant and evidence for anastomotic breakdown with stool in the presacral region. EXAM: CT-GUIDED PLACEMENT OF TWO DRAINS IN THE RIGHT LOWER QUADRANT ABSCESSES CT-GUIDED PLACEMENT OF DRAIN IN PRESACRAL ABSCESS/FECAL COLLECTION MEDICATIONS: The patient is currently admitted to the hospital and receiving intravenous antibiotics. The antibiotics were administered within an appropriate time frame prior to the initiation of the procedure. ANESTHESIA/SEDATION: 5.0 mg IV Versed 250 mcg IV Fentanyl Moderate Sedation Time:  1 hour 34 minutes The patient was continuously monitored during the procedure by the interventional radiology nurse under my direct supervision. COMPLICATIONS: None immediate. TECHNIQUE: Informed written consent was obtained from the patient after a thorough discussion of the procedural risks, benefits and alternatives. All questions were addressed. Maximal Sterile Barrier Technique was utilized including caps, mask, sterile gowns, sterile gloves, sterile drape, hand hygiene and skin antiseptic. A timeout was performed prior to the initiation of the procedure. PROCEDURE: Patient was initially placed at supine. CT images through the lower abdomen and pelvis were obtained. The abscess is in the right lower quadrant was initially targeted. The right lower abdomen was  prepped with chlorhexidine and sterile field  was created. Maximal barrier sterile technique was utilized including caps, mask, sterile gowns, sterile gloves, sterile drape, hand hygiene and skin antiseptic. The skin was anesthetized with 1% lidocaine. Using CT guidance, an 18 gauge trocar needle was directed into the most inferior aspect of the right lower quadrant abscess along the anterior lower abdomen. Thick yellow purulent fluid was aspirated. Superstiff Amplatz wire was advanced into the collection. The tract was dilated to accommodate a 10 [place] drain. Follow up CT images demonstrated that there was still a lateral component to the right lower quadrant abscess that was not being drained. Therefore, the right lateral abdomen was prepped with chlorhexidine and sterile field was created. Skin was anesthetized with 1% lidocaine. Using CT guidance, 18 gauge trocar needle was directed into the lateral abscess component. Thick yellow fluid was aspirated. Superstiff Amplatz wire was advanced into the collection and another 10 [place] multipurpose drain was placed. A total of 75 mL of thick yellow fluid was removed from the right lower quadrant abscesses. Both catheters were sutured to skin and attached to suction bulbs. Dressings were placed. The patient was rolled onto his left side. The abscess/fecal collection in the presacral region was targeted. Right transgluteal approach was selected. Skin was prepped and draped in sterile fashion. Maximal barrier sterile technique was utilized including caps, mask, sterile gowns, sterile gloves, sterile drape, hand hygiene and skin antiseptic. Skin was anesthetized with 1% lidocaine. Using CT guidance, an 18 gauge trocar needle was directed into the abscess/fecal collection. Super stiff Amplatz wire was placed. However, it appeared that the wire was initially extending into the colon. As a result, this wire needle were removed. Needle was redirected slightly more lateral. The  wire was extending anteriorly into the collection rather than into the colon. The tract was dilated to accommodate a 12 [place] multipurpose drain. Approximately 20 mL of thick brown foul-smelling purulent fluid was removed. Findings are compatible with a combination of purulent fluid and feces. The drain was irrigated with sterile saline and attached to a suction bulb. Catheter was sutured to skin. Dressing was placed. FINDINGS: Initial CT images image demonstrated complex air-fluid collection in the right lower quadrant compatible with an abscess. The abscess was well delineated on this examination because contrast had moved into the colon compared to the previous examination. Right lower quadrant abscess is complex and required 2 separate drains for adequate decompression. Again noted is stool-like material in the presacral region around the rectal anastomosis. This stool-like material now contains high-density material which is compatible with oral contrast from the prior CT. Drain was successfully placed within the abscess/fecal collection around the anastomosis and distal colon. IMPRESSION: 1. CT-guided placement of 2 drains within the right lower quadrant abscesses. Thick yellow purulent fluid was removed from the right lower quadrant abscesses. 2. CT images confirmed oral contrast leaking into the presacral space and compatible with an anastomotic leak. 3. CT-guided placement of a drain within the presacral abscess/fecal collection. Electronically Signed   By: Markus Daft M.D.   On: 11/01/2019 21:00   CT IMAGE GUIDED DRAINAGE BY PERCUTANEOUS CATHETER  Result Date: 11/01/2019 INDICATION: 44 year old with history rectal adenocarcinoma. Patient had a low anterior resection on 10/15/2019. Patient presented with abdominal pain. CT imaging demonstrated a large abscess in the right lower quadrant and evidence for anastomotic breakdown with stool in the presacral region. EXAM: CT-GUIDED PLACEMENT OF TWO DRAINS IN  THE RIGHT LOWER QUADRANT ABSCESSES CT-GUIDED PLACEMENT OF DRAIN IN PRESACRAL ABSCESS/FECAL COLLECTION MEDICATIONS: The patient is  currently admitted to the hospital and receiving intravenous antibiotics. The antibiotics were administered within an appropriate time frame prior to the initiation of the procedure. ANESTHESIA/SEDATION: 5.0 mg IV Versed 250 mcg IV Fentanyl Moderate Sedation Time:  1 hour 34 minutes The patient was continuously monitored during the procedure by the interventional radiology nurse under my direct supervision. COMPLICATIONS: None immediate. TECHNIQUE: Informed written consent was obtained from the patient after a thorough discussion of the procedural risks, benefits and alternatives. All questions were addressed. Maximal Sterile Barrier Technique was utilized including caps, mask, sterile gowns, sterile gloves, sterile drape, hand hygiene and skin antiseptic. A timeout was performed prior to the initiation of the procedure. PROCEDURE: Patient was initially placed at supine. CT images through the lower abdomen and pelvis were obtained. The abscess is in the right lower quadrant was initially targeted. The right lower abdomen was prepped with chlorhexidine and sterile field was created. Maximal barrier sterile technique was utilized including caps, mask, sterile gowns, sterile gloves, sterile drape, hand hygiene and skin antiseptic. The skin was anesthetized with 1% lidocaine. Using CT guidance, an 18 gauge trocar needle was directed into the most inferior aspect of the right lower quadrant abscess along the anterior lower abdomen. Thick yellow purulent fluid was aspirated. Superstiff Amplatz wire was advanced into the collection. The tract was dilated to accommodate a 10 [place] drain. Follow up CT images demonstrated that there was still a lateral component to the right lower quadrant abscess that was not being drained. Therefore, the right lateral abdomen was prepped with chlorhexidine and  sterile field was created. Skin was anesthetized with 1% lidocaine. Using CT guidance, 18 gauge trocar needle was directed into the lateral abscess component. Thick yellow fluid was aspirated. Superstiff Amplatz wire was advanced into the collection and another 10 [place] multipurpose drain was placed. A total of 75 mL of thick yellow fluid was removed from the right lower quadrant abscesses. Both catheters were sutured to skin and attached to suction bulbs. Dressings were placed. The patient was rolled onto his left side. The abscess/fecal collection in the presacral region was targeted. Right transgluteal approach was selected. Skin was prepped and draped in sterile fashion. Maximal barrier sterile technique was utilized including caps, mask, sterile gowns, sterile gloves, sterile drape, hand hygiene and skin antiseptic. Skin was anesthetized with 1% lidocaine. Using CT guidance, an 18 gauge trocar needle was directed into the abscess/fecal collection. Super stiff Amplatz wire was placed. However, it appeared that the wire was initially extending into the colon. As a result, this wire needle were removed. Needle was redirected slightly more lateral. The wire was extending anteriorly into the collection rather than into the colon. The tract was dilated to accommodate a 12 [place] multipurpose drain. Approximately 20 mL of thick brown foul-smelling purulent fluid was removed. Findings are compatible with a combination of purulent fluid and feces. The drain was irrigated with sterile saline and attached to a suction bulb. Catheter was sutured to skin. Dressing was placed. FINDINGS: Initial CT images image demonstrated complex air-fluid collection in the right lower quadrant compatible with an abscess. The abscess was well delineated on this examination because contrast had moved into the colon compared to the previous examination. Right lower quadrant abscess is complex and required 2 separate drains for adequate  decompression. Again noted is stool-like material in the presacral region around the rectal anastomosis. This stool-like material now contains high-density material which is compatible with oral contrast from the prior CT. Drain was successfully  placed within the abscess/fecal collection around the anastomosis and distal colon. IMPRESSION: 1. CT-guided placement of 2 drains within the right lower quadrant abscesses. Thick yellow purulent fluid was removed from the right lower quadrant abscesses. 2. CT images confirmed oral contrast leaking into the presacral space and compatible with an anastomotic leak. 3. CT-guided placement of a drain within the presacral abscess/fecal collection. Electronically Signed   By: Markus Daft M.D.   On: 11/01/2019 21:00    Labs:  CBC: Recent Labs    10/19/19 0906 10/21/19 0255 10/31/19 1750 11/02/19 0430  WBC 10.7* 10.6* 12.4* 12.0*  HGB 14.1 11.6* 11.3* 9.9*  HCT 41.9 35.5* 34.3* 30.6*  PLT 271 278 738* 569*    COAGS: Recent Labs    11/01/19 0731  INR 1.2    BMP: Recent Labs    10/16/19 0445 10/16/19 0445 10/17/19 0418 10/19/19 0906 10/31/19 1750 11/02/19 0430  NA 136  --  138 133* 137  --   K 4.4   < > 3.9 4.4 3.8 4.0  CL 103  --  103 98 101  --   CO2 26  --  27 25 24   --   GLUCOSE 134*  --  116* 134* 102*  --   BUN 8  --  11 13 11   --   CALCIUM 8.7*  --  8.6* 8.7* 8.5*  --   CREATININE 0.92   < > 1.00 0.87 0.77 0.99  GFRNONAA >60   < > >60 >60 >60 >60  GFRAA >60   < > >60 >60 >60 >60   < > = values in this interval not displayed.    LIVER FUNCTION TESTS: Recent Labs    06/30/19 0855 07/10/19 0805 07/24/19 1444 08/11/19 1050  BILITOT 0.4 0.6 0.8 0.5  AST 38 33 26 41  ALT 53* 46* 32 52*  ALKPHOS 154* 131* 92 125  PROT 6.6 6.4* 6.3* 6.7  ALBUMIN 3.9 3.9 3.9 3.9    Assessment and Plan: 44 y/o M with history of colorectal cancer s/p robotic assisted low anterior resection 10/15/19 with subsequent development of multiple  intra-abdominal abscesses. IR placed abdominal drains x 3 on 5/22; afebrile; no new labs today; drain cx- enterococcus(prelim); will plan f/u CT for 5/27 to assess adequacy of drainage, esp if OP remains low.   Electronically Signed: D. Rowe Robert, PA-C 11/04/2019, 1:34 PM   I spent a total of 15 minutes at the the patient's bedside AND on the patient's hospital floor or unit, greater than 50% of which was counseling/coordinating care for abdominal/pelvic abscess drains    Patient ID: Brandon Barry, male   DOB: [birth year], 44 y.o.   MRN: [id number]

## 2019-11-04 NOTE — Progress Notes (Signed)
Postprocedural intraabdominal abscess  Subjective: Feels somewhat better.  Tolerating some full liquids  Objective: Vital signs in last 24 hours: Temp:  [98.1 F (36.7 C)-100.8 F (38.2 C)] 100.6 F (38.1 C) (05/25 0556) Pulse Rate:  [71-88] 81 (05/25 0556) Resp:  [16-20] 18 (05/25 0556) BP: (119-133)/(80-93) 119/80 (05/25 0556) SpO2:  [96 %-100 %] 96 % (05/25 0556) Last BM Date: 11/03/19  Intake/Output from previous day: 05/24 0701 - 05/25 0700 In: 1699 [P.O.:1440; I.V.:79; IV Piggyback:150] Out: 1677.5 [Urine:1650; Drains:27.5] Intake/Output this shift: No intake/output data recorded.  General appearance: alert and cooperative GI: normal findings: soft, non-distended, appropriately tender Incision/Wound: clean, dry, intact  Lab Results:  No results found for this or any previous visit (from the past 24 hour(s)).   Studies/Results Radiology     MEDS, Scheduled . acetaminophen  500 mg Oral TID WC & HS  . Chlorhexidine Gluconate Cloth  6 each Topical Daily  . enoxaparin (LOVENOX) injection  40 mg Subcutaneous Q24H  . gabapentin  600 mg Oral TID  . lip balm  1 application Topical BID  . metoprolol tartrate  12.5 mg Oral BID  . Ensure Max Protein  11 oz Oral BID  . sodium chloride flush  5 mL Intracatheter Q8H     Assessment: Postprocedural intraabdominal abscess Cont IV abx, Drains  Plan: Cont fulls today Cont fluids to help with dehydration Ambulate in hall [place] to shower if drains are covered  LOS: 3 days    Rosario Adie, MD [facility], [place]    11/04/2019 8:35 AM

## 2019-11-05 LAB — CBC
HCT: 29.1 % — ABNORMAL LOW (ref 39.0–52.0)
Hemoglobin: 9.5 g/dL — ABNORMAL LOW (ref 13.0–17.0)
MCH: 30.4 pg (ref 26.0–34.0)
MCHC: 32.6 g/dL (ref 30.0–36.0)
MCV: 93 fL (ref 80.0–100.0)
Platelets: 470 10*3/uL — ABNORMAL HIGH (ref 150–400)
RBC: 3.13 MIL/uL — ABNORMAL LOW (ref 4.22–5.81)
RDW: 13.2 % (ref 11.5–15.5)
WBC: 11.6 10*3/uL — ABNORMAL HIGH (ref 4.0–10.5)
nRBC: 0 % (ref 0.0–0.2)

## 2019-11-05 LAB — COMPREHENSIVE METABOLIC PANEL
ALT: 27 U/L (ref 0–44)
AST: 17 U/L (ref 15–41)
Albumin: 2.2 g/dL — ABNORMAL LOW (ref 3.5–5.0)
Alkaline Phosphatase: 167 U/L — ABNORMAL HIGH (ref 38–126)
Anion gap: 7 (ref 5–15)
BUN: 6 mg/dL (ref 6–20)
CO2: 27 mmol/L (ref 22–32)
Calcium: 8.2 mg/dL — ABNORMAL LOW (ref 8.9–10.3)
Chloride: 101 mmol/L (ref 98–111)
Creatinine, Ser: 0.79 mg/dL (ref 0.61–1.24)
GFR calc Af Amer: >60 mL/min (ref >60)
GFR calc non Af Amer: >60 mL/min (ref >60)
Glucose, Bld: 108 mg/dL — ABNORMAL HIGH (ref 70–99)
Potassium: 3.2 mmol/L — ABNORMAL LOW (ref 3.5–5.1)
Sodium: 135 mmol/L (ref 135–145)
Total Bilirubin: 0.8 mg/dL (ref 0.3–1.2)
Total Protein: 5.6 g/dL — ABNORMAL LOW (ref 6.5–8.1)

## 2019-11-05 NOTE — Progress Notes (Addendum)
Postprocedural intraabdominal abscess  Subjective: Feels somewhat better.  Tolerating more full liquids.  Had 2 Ensure shakes yesterday.  Having some pain from the buttock drain.  Doing better urinating with valium  Objective: Vital signs in last 24 hours: Temp:  [98.2 F (36.8 C)-100.9 F (38.3 C)] 99.9 F (37.7 C) (05/26 0607) Pulse Rate:  [60-79] 60 (05/26 0846) Resp:  [16-18] 18 (05/26 0607) BP: (122-139)/(81-90) 139/85 (05/26 0846) SpO2:  [95 %-98 %] 95 % (05/26 0607) Last BM Date: 11/04/19  Intake/Output from previous day: 05/25 0701 - 05/26 0700 In: 2410.3 [P.O.:1317; I.V.:874.5; IV Piggyback:218.8] Out: 2595 [Urine:2450; Drains:145] Intake/Output this shift: Total I/O In: -  Out: 822 [Urine:800; Drains:22]  General appearance: alert and cooperative GI: normal findings: soft, non-distended, appropriately tender Incision/Wound: clean, dry, intact  Lab Results:  Results for orders placed or performed during the hospital encounter of 10/31/19 (from the past 24 hour(s))  Comprehensive metabolic panel     Status: Abnormal   Collection Time: 11/05/19  4:36 AM  Result Value Ref Range   Sodium 135 135 - 145 mmol/L   Potassium 3.2 (L) 3.5 - 5.1 mmol/L   Chloride 101 98 - 111 mmol/L   CO2 27 22 - 32 mmol/L   Glucose, Bld 108 (H) 70 - 99 mg/dL   BUN 6 6 - 20 mg/dL   Creatinine, Ser 0.79 0.61 - 1.24 mg/dL   Calcium 8.2 (L) 8.9 - 10.3 mg/dL   Total Protein 5.6 (L) 6.5 - 8.1 g/dL   Albumin 2.2 (L) 3.5 - 5.0 g/dL   AST 17 15 - 41 U/L   ALT 27 0 - 44 U/L   Alkaline Phosphatase 167 (H) 38 - 126 U/L   Total Bilirubin 0.8 0.3 - 1.2 mg/dL   GFR calc non Af Amer >60 >60 mL/min   GFR calc Af Amer >60 >60 mL/min   Anion gap 7 5 - 15  CBC     Status: Abnormal   Collection Time: 11/05/19  4:36 AM  Result Value Ref Range   WBC 11.6 (H) 4.0 - 10.5 K/uL   RBC 3.13 (L) 4.22 - 5.81 MIL/uL   Hemoglobin 9.5 (L) 13.0 - 17.0 g/dL   HCT 29.1 (L) 39.0 - 52.0 %   MCV 93.0 80.0 - 100.0 fL    MCH 30.4 26.0 - 34.0 pg   MCHC 32.6 30.0 - 36.0 g/dL   RDW 13.2 11.5 - 15.5 %   Platelets 470 (H) 150 - 400 K/uL   nRBC 0.0 0.0 - 0.2 %     Studies/Results Radiology     MEDS, Scheduled . acetaminophen  500 mg Oral TID WC & HS  . Chlorhexidine Gluconate Cloth  6 each Topical Daily  . enoxaparin (LOVENOX) injection  40 mg Subcutaneous Q24H  . gabapentin  600 mg Oral TID  . lip balm  1 application Topical BID  . metoprolol tartrate  12.5 mg Oral BID  . Ensure Max Protein  11 oz Oral BID  . sodium chloride flush  5 mL Intracatheter Q8H     Assessment: Postprocedural intraabdominal abscess Cont IV abx, Drains  Plan: Cont fulls  Stop IVF's Ambulate in hall Cont Abx x1 week total Ok to shower if drains are covered  LOS: 4 days    Rosario Adie, MD [facility] Surgery, [place]    11/05/2019 11:49 AM

## 2019-11-06 ENCOUNTER — Encounter: Payer: Commercial Managed Care - PPO | Admitting: Physical Therapy

## 2019-11-06 ENCOUNTER — Inpatient Hospital Stay (HOSPITAL_COMMUNITY): Payer: Commercial Managed Care - PPO

## 2019-11-06 LAB — AEROBIC/ANAEROBIC CULTURE W GRAM STAIN (SURGICAL/DEEP WOUND)

## 2019-11-06 MED ORDER — IOHEXOL 300 MG/ML  SOLN
100.0000 mL | Freq: Once | INTRAMUSCULAR | Status: AC | PRN
  Administered 2019-11-06: 100 mL via INTRAVENOUS

## 2019-11-06 MED ORDER — SODIUM CHLORIDE (PF) 0.9 % IJ SOLN
INTRAMUSCULAR | Status: AC
  Filled 2019-11-06: qty 50

## 2019-11-06 NOTE — Progress Notes (Signed)
Postprocedural intraabdominal abscess  Subjective: Feels somewhat better.  Tolerating more full liquids.  Had 3 Ensure shakes yesterday.  Having some pressure/pain from the buttock drain.  Doing better urinating with valium  Objective: Vital signs in last 24 hours: Temp:  [98.9 F (37.2 C)-99.5 F (37.5 C)] 98.9 F (37.2 C) (05/27 0624) Pulse Rate:  [68-76] 76 (05/27 0624) Resp:  [15-20] 16 (05/27 0624) BP: (126-131)/(89-92) 126/92 (05/27 0624) SpO2:  [94 %-99 %] 94 % (05/27 0624) Last BM Date: 11/06/19  Intake/Output from previous day: 05/26 0701 - 05/27 0700 In: 1673 [P.O.:450; I.V.:917.7; IV Piggyback:305.3] Out: 2419 [Urine:2375; Drains:44] Intake/Output this shift: Total I/O In: 320.3 [P.O.:240; I.V.:42; Other:15; IV Piggyback:23.3] Out: 230 [Urine:200; Drains:30]  General appearance: alert and cooperative GI: normal findings: soft, non-distended, appropriately tender Incision/Wound: clean, dry, intact  Lab Results:  No results found for this or any previous visit (from the past 24 hour(s)).   Studies/Results Radiology     MEDS, Scheduled . acetaminophen  500 mg Oral TID WC & HS  . Chlorhexidine Gluconate Cloth  6 each Topical Daily  . enoxaparin (LOVENOX) injection  40 mg Subcutaneous Q24H  . gabapentin  600 mg Oral TID  . lip balm  1 application Topical BID  . metoprolol tartrate  12.5 mg Oral BID  . Ensure Max Protein  11 oz Oral BID  . sodium chloride flush  5 mL Intracatheter Q8H     Assessment: Postprocedural intraabdominal abscess Cont IV abx, Drains: plan for repeat CT today  Plan: Cont fulls  Ambulate in hall Cont Abx x1 week total Ok to shower if drains are covered  LOS: 5 days    Rosario Adie, MD [facility], [place]    11/06/2019 11:10 AM

## 2019-11-07 ENCOUNTER — Inpatient Hospital Stay: Payer: Self-pay

## 2019-11-07 LAB — BASIC METABOLIC PANEL
Anion gap: 11 (ref 5–15)
BUN: 7 mg/dL (ref 6–20)
CO2: 27 mmol/L (ref 22–32)
Calcium: 8.9 mg/dL (ref 8.9–10.3)
Chloride: 101 mmol/L (ref 98–111)
Creatinine, Ser: 0.98 mg/dL (ref 0.61–1.24)
GFR calc Af Amer: >60 mL/min (ref >60)
GFR calc non Af Amer: >60 mL/min (ref >60)
Glucose, Bld: 106 mg/dL — ABNORMAL HIGH (ref 70–99)
Potassium: 3.6 mmol/L (ref 3.5–5.1)
Sodium: 139 mmol/L (ref 135–145)

## 2019-11-07 LAB — GLUCOSE, CAPILLARY: Glucose-Capillary: 104 mg/dL — ABNORMAL HIGH (ref 70–99)

## 2019-11-07 MED ORDER — SODIUM CHLORIDE 0.9% FLUSH
10.0000 mL | INTRAVENOUS | Status: DC | PRN
  Administered 2019-11-20 – 2019-12-02 (×3): 10 mL

## 2019-11-07 MED ORDER — TRAVASOL 10 % IV SOLN
INTRAVENOUS | Status: AC
  Filled 2019-11-07: qty 480

## 2019-11-07 MED ORDER — INSULIN ASPART 100 UNIT/ML ~~LOC~~ SOLN
0.0000 [IU] | Freq: Three times a day (TID) | SUBCUTANEOUS | Status: DC
  Administered 2019-11-08 – 2019-11-09 (×2): 1 [IU] via SUBCUTANEOUS

## 2019-11-07 NOTE — Progress Notes (Signed)
Referring Physician(s): [facility]  Supervising Physician: Dr. Earleen Newport  Patient Status:  [facility] - In-pt  Chief Complaint: Abdominal/pelvic pain/abscesses   Subjective: Pt still having some abd/pelvic discomfort, esp at TG site but feels better than prior to drain placements; Now NPO PICC line placed today, to start TPN   Allergies: Patient has no known allergies.  Medications:  Current Facility-Administered Medications:  .  0.9 %  sodium chloride infusion, , Intravenous, PRN, Leighton Ruff, MD, Last Rate: 10 mL/hr at 11/07/19 1007, 250 mL at 11/07/19 1007 .  acetaminophen (TYLENOL) tablet 500 mg, 500 mg, Oral, TID WC & HS, Michael Boston, MD, 500 mg at 11/07/19 1235 .  alum & mag hydroxide-simeth (MAALOX/MYLANTA) 200-200-20 MG/5ML suspension 30 mL, 30 mL, Oral, Q6H PRN, Michael Boston, MD .  anidulafungin (ERAXIS) 100 mg in sodium chloride 0.9 % 100 mL IVPB, 100 mg, Intravenous, Q24H, Michael Boston, MD, Last Rate: 78 mL/hr at 11/07/19 1011, Restarted at 11/07/19 1011 .  Chlorhexidine Gluconate Cloth 2 % PADS 6 each, 6 each, Topical, Daily, Michael Boston, MD, 6 each at 11/06/19 [phone] .  dextrose 5 % in lactated ringers infusion, , Intravenous, Continuous, Leighton Ruff, MD, Last Rate: 10 mL/hr at 11/05/19 1638, Rate Change at 11/05/19 1638 .  diazepam (VALIUM) tablet 2 mg, 2 mg, Oral, [id number] PRN, Leighton Ruff, MD, 2 mg at 0000000 1741 .  diphenhydrAMINE (BENADRYL) injection 12.5-25 mg, 12.5-25 mg, Intravenous, Q6H PRN, Michael Boston, MD .  enalaprilat (VASOTEC) injection 0.625-1.25 mg, 0.625-1.25 mg, Intravenous, Q6H PRN, Michael Boston, MD .  enoxaparin (LOVENOX) injection 40 mg, 40 mg, Subcutaneous, Q24H, Michael Boston, MD, 40 mg at 11/07/19 0940 .  gabapentin (NEURONTIN) capsule 600 mg, 600 mg, Oral, TID, Michael Boston, MD, 600 mg at 11/07/19 0940 .  guaiFENesin-dextromethorphan (ROBITUSSIN DM) 100-10 MG/5ML syrup 10 mL, 10 mL, Oral, Q4H PRN, Michael Boston, MD .   HYDROcodone-acetaminophen (NORCO) 10-325 MG per tablet 1 tablet, 1 tablet, Oral, Q4H PRN, Michael Boston, MD, 1 tablet at 11/07/19 1017 .  hydrocortisone (ANUSOL-HC) 2.5 % rectal cream 1 application, 1 application, Topical, QID PRN, Michael Boston, MD .  hydrocortisone cream 1 % 1 application, 1 application, Topical, TID PRN, Michael Boston, MD .  HYDROmorphone (DILAUDID) injection 1-2 mg, 1-2 mg, Intravenous, Q1H PRN, Armandina Gemma, MD, 2 mg at 11/07/19 1136 .  insulin aspart (novoLOG) injection 0-9 Units, 0-9 Units, Subcutaneous, Q8H, W[place]ord, Cindie Laroche, RPH .  lip balm (CARMEX) ointment 1 application, 1 application, Topical, BID, Michael Boston, MD, 1 application at [id number] 1018 .  magic mouthwash, 15 mL, Oral, QID PRN, Michael Boston, MD .  menthol-cetylpyridinium (CEPACOL) lozenge 3 mg, 1 lozenge, Oral, PRN, Michael Boston, MD .  metoprolol tartrate (LOPRESSOR) injection 5 mg, 5 mg, Intravenous, Q6H PRN, Michael Boston, MD .  metoprolol tartrate (LOPRESSOR) tablet 12.5 mg, 12.5 mg, Oral, BID, Michael Boston, MD, 12.5 mg at 11/07/19 0939 .  ondansetron (ZOFRAN) injection 4 mg, 4 mg, Intravenous, Q6H PRN **OR** ondansetron (ZOFRAN) 8 mg in sodium chloride 0.9 % 50 mL IVPB, 8 mg, Intravenous, Q6H PRN, Michael Boston, MD .  phenol (CHLORASEPTIC) mouth spray 1-2 spray, 1-2 spray, Mouth/Throat, PRN, Michael Boston, MD .  piperacillin-tazobactam (ZOSYN) IVPB 3.375 g, 3.375 g, Intravenous, Q8H, Gosai, Puja, PA-C, Last Rate: 12.5 mL/hr at 11/07/19 0945, 3.375 g at 11/07/19 0945 .  prochlorperazine (COMPAZINE) injection 5-10 mg, 5-10 mg, Intravenous, Q4H PRN, Michael Boston, MD .  protein supplement (ENSURE MAX) liquid, 11 oz, Oral, BID, Leighton Ruff,  MD, 11 oz at 11/06/19 2145 .  simethicone (MYLICON) 40 [id number] suspension 40 mg, 40 mg, Oral, QID PRN, Michael Boston, MD .  sodium chloride flush (NS) 0.9 % injection 10-40 mL, 10-40 mL, Intracatheter, PRN, Leighton Ruff, MD .  sodium chloride flush (NS) 0.9 %  injection 5 mL, 5 mL, Intracatheter, Q8H, Markus Daft, MD, 5 mL at 11/07/19 0533 .  TPN ADULT (ION), , Intravenous, Continuous TPN, Polly Cobia, RPH    Vital Signs: BP 126/81   Pulse 63   Temp 99.1 F (37.3 C) (Oral)   Resp 18   Ht 6' (1.829 m)   Wt 72.6 kg   SpO2 96%   BMI 21.70 kg/m   Physical Exam Neurological:     General: No focal deficit present.     Mental Status: He is alert and oriented to person, place, and time.   All drains intact, sites clean, NT Ant/midline drain with cloudy drainage RLQ drain with scant serous output, some old purulent stranding in tubing TG drain with feculent appearing fluid  Imaging: CT ABDOMEN PELVIS W CONTRAST  Result Date: 11/06/2019 CLINICAL DATA:  Post drainage of intra-abdominal abscess. EXAM: CT ABDOMEN AND PELVIS WITH CONTRAST TECHNIQUE: Multidetector CT imaging of the abdomen and pelvis was performed using the standard protocol following bolus administration of intravenous contrast. CONTRAST:  113mL OMNIPAQUE IOHEXOL 300 MG/ML  SOLN COMPARISON:  Oct 31, 2019. FINDINGS: Lower chest: Minimal bibasilar subsegmental atelectasis. Hepatobiliary: No gallstones or biliary dilatation is noted. Stable hemangioma is noted peripherally in posterior segment of right hepatic lobe. Pancreas: Unremarkable. No pancreatic ductal dilatation or surrounding inflammatory changes. Spleen: Normal in size without focal abnormality. Adrenals/Urinary Tract: Adrenal glands are unremarkable. Kidneys are normal, without renal calculi, focal lesion, or hydronephrosis. Bladder is unremarkable. Stomach/Bowel: Findings are again consistent with breakdown of rectal anastomosis as noted on prior exam. There has been interval placement of right-sided transgluteal percutaneous drainage catheter within this fluid collection. The fluid collection appears to be significantly smaller in size, currently measuring 7.2 x 4.9 cm. The stomach is unremarkable. No colonic dilatation is  noted. Significantly increased small bowel dilatation is noted concerning for ileus or possibly distal small bowel obstruction. Vascular/Lymphatic: No significant vascular findings are present. No enlarged abdominal or pelvic lymph nodes. Reproductive: Prostate is unremarkable. Other: There is been interval placement of percutaneous drainage catheters into 2 fluid collections in the right lower quadrant. The more lateral fluid collection appears to be completely decompressed. The more medial fluid collection is significantly decreased in size measuring 6.4 x 3.9 cm compared to prior exam. Musculoskeletal: No acute or significant osseous findings. IMPRESSION: 1. Findings are again consistent with breakdown of rectal anastomosis as noted on prior exam. 2. There has been interval placement of right-sided transgluteal percutaneous drainage catheter within the perirectal fluid collection. The fluid collection appears to be significantly smaller in size, currently measuring 7.2 x 4.9 cm. 3. Interval placement of percutaneous drainage catheters into 2 fluid collections in the right lower quadrant. The more lateral fluid collection appears to be completely decompressed. The more medial fluid collection is significantly decreased in size measuring 6.4 x 3.9 cm compared to prior exam. 4. Significantly increased small bowel dilatation is noted concerning for ileus or possibly distal small bowel obstruction. Electronically Signed   By: Marijo Conception M.D.   On: 11/06/2019 19:26   [place] EKG SITE RITE  Result Date: 11/07/2019 If Site Rite image not attached, placement could not be confirmed due to  current cardiac rhythm.   Labs:  CBC: Recent Labs    10/21/19 0255 10/31/19 1750 11/02/19 0430 11/05/19 0436  WBC 10.6* 12.4* 12.0* 11.6*  HGB 11.6* 11.3* 9.9* 9.5*  HCT 35.5* 34.3* 30.6* 29.1*  PLT 278 738* 569* 470*    COAGS: Recent Labs    11/01/19 0731  INR 1.2    BMP: Recent Labs    10/19/19 0906  10/19/19 0906 10/31/19 1750 11/02/19 0430 11/05/19 0436 11/07/19 0759  NA 133*  --  137  --  135 139  K 4.4   < > 3.8 4.0 3.2* 3.6  CL 98  --  101  --  101 101  CO2 25  --  24  --  27 27  GLUCOSE 134*  --  102*  --  108* 106*  BUN 13  --  11  --  6 7  CALCIUM 8.7*  --  8.5*  --  8.2* 8.9  CREATININE 0.87   < > 0.77 0.99 0.79 0.98  GFRNONAA >60   < > >60 >60 >60 >60  GFRAA >60   < > >60 >60 >60 >60   < > = values in this interval not displayed.    LIVER FUNCTION TESTS: Recent Labs    07/10/19 0805 07/24/19 1444 08/11/19 1050 11/05/19 0436  BILITOT 0.6 0.8 0.5 0.8  AST 33 26 41 17  ALT 46* 32 52* 27  ALKPHOS 131* 92 125 167*  PROT 6.4* 6.3* 6.7 5.6*  ALBUMIN 3.9 3.9 3.9 2.2*    Assessment and Plan: 44 y/o M with history of colorectal cancer s/p robotic assisted low anterior resection 10/15/19 with subsequent development of multiple intra-abdominal abscesses. IR placed abdominal drains x 3 on 5/22; afebrile CT reviewed with Dr. Earleen Newport. Interval improvement in all 3 abscesses.  RLQ abscess essentially resolved. Given scant output, feel this drain can be removed, and was removed at bedside. IR cont to follow along.  Electronically Signed: Ascencion Dike, PA-C 11/07/2019, 2:20 PM   I spent a total of 15 minutes at the the patient's bedside AND on the patient's hospital floor or unit, greater than 50% of which was counseling/coordinating care for abdominal/pelvic abscess drains

## 2019-11-07 NOTE — Plan of Care (Signed)

## 2019-11-07 NOTE — Progress Notes (Signed)
Postprocedural intraabdominal abscess  Subjective: Had trouble with pain and bloating yesterday.  Having BM's  Objective: Vital signs in last 24 hours: Temp:  [97.8 F (36.6 C)-99.1 F (37.3 C)] 99.1 F (37.3 C) (05/28 0605) Pulse Rate:  [65-74] 74 (05/28 0605) Resp:  [18-19] 18 (05/28 0605) BP: (116-134)/(71-96) 128/96 (05/28 0605) SpO2:  [96 %-99 %] 96 % (05/28 0605) Last BM Date: 11/06/19  Intake/Output from previous day: 05/27 0701 - 05/28 0700 In: 1905.3 [P.O.:1360; I.V.:221.5; IV Piggyback:293.8] Out: 1692 [Urine:1630; Drains:62] Intake/Output this shift: No intake/output data recorded.  General appearance: alert and cooperative GI: normal findings: soft, non-distended, appropriately tender Incision/Wound: clean, dry, intact  Lab Results:  No results found for this or any previous visit (from the past 24 hour(s)).   Studies/Results Radiology: CT shows improving fluid collections, possible SBO     MEDS, Scheduled . acetaminophen  500 mg Oral TID WC & HS  . Chlorhexidine Gluconate Cloth  6 each Topical Daily  . enoxaparin (LOVENOX) injection  40 mg Subcutaneous Q24H  . gabapentin  600 mg Oral TID  . lip balm  1 application Topical BID  . metoprolol tartrate  12.5 mg Oral BID  . Ensure Max Protein  11 oz Oral BID  . sodium chloride flush  5 mL Intracatheter Q8H     Assessment: Postprocedural intraabdominal abscess Cont IV abx, Drains: per IR  Plan: NPO Will place PICC and start TPN due to intolerance of PO Ambulate in hall Cont Abx x1 week total Ok to shower if drains are covered  LOS: 6 days    Rosario Adie, MD [facility] Surgery, [place]    11/07/2019 7:45 AM

## 2019-11-07 NOTE — Progress Notes (Signed)
Peripherally Inserted Central Catheter Placement  The IV Nurse has discussed with the patient and/or persons authorized to consent for the patient, the purpose of this procedure and the potential benefits and risks involved with this procedure.  The benefits include less needle sticks, lab draws from the catheter, and the patient may be discharged home with the catheter. Risks include, but not limited to, infection, bleeding, blood clot (thrombus formation), and puncture of an artery; nerve damage and irregular heartbeat and possibility to perform a PICC exchange if needed/ordered by physician.  Alternatives to this procedure were also discussed.  Bard Power PICC patient education guide, fact sheet on infection prevention and patient information card has been provided to patient /or left at bedside.    PICC Placement Documentation  PICC Double Lumen [id number] PICC Right Basilic 40 cm 0 cm (Active)  Indication for Insertion or Continuance of Line Administration of hyperosmolar/irritating solutions (i.e. TPN, Vancomycin, etc.) 11/07/19 1052  Exposed Catheter (cm) 0 cm 11/07/19 1052  Site Assessment Clean;Dry;Intact 11/07/19 1052  Lumen #1 Status Flushed;Blood return noted 11/07/19 1052  Lumen #2 Status Flushed;Blood return noted 11/07/19 1052  Dressing Type Transparent 11/07/19 1052  Dressing Status Clean;Dry;Intact;Antimicrobial disc in place;Other (Comment) 11/07/19 1052  Dressing Intervention New dressing 11/07/19 1052  Dressing Change Due 11/14/19 11/07/19 [employer], Bretta Fees [place]ce 11/07/2019, 10:53 AM

## 2019-11-07 NOTE — Progress Notes (Signed)
Initial Nutrition Assessment  INTERVENTION:   -TPN per Pharmacy -Will monitor for diet advancement  NUTRITION DIAGNOSIS:   Increased nutrient needs related to chronic illness, cancer and cancer related treatments as evidenced by estimated needs.  GOAL:   Patient will meet greater than or equal to 90% of their needs  MONITOR:   Labs, Weight trends, I & O's, Skin(TPN)  REASON FOR ASSESSMENT:   Consult New TPN/TNA  ASSESSMENT:   44 year old male who underwent robotic low anterior resection on May 5, [id number] by Dr. Leighton Ruff for an adenocarcinoma of the rectum.  Patient was discharged home on Oct 21, 2019.  Since discharge the patient has had persistent lower abdominal pain.  5/22: s/p CT guided drainage of percutaneous catheter 5/23: pt on clear liquid diet 5/24: diet was advanced to fulls  **RD working remotely**  Patient with history of colorectal cancer, completed chemotherapy and radiation in February 2021. S/p resection earlier this month. Prior to surgery pt was having abdominal pain and has lost weight.   Per chart review ,pt has been c/o cramping and abdominal pain has continued. Pt was on full liquids, was drinking Ensure Max supplements but was having bloating and pain so MD changed pt back to NPO and TPN to be initiated. CT on 5/27 suggests possible SBO.   Per Pharmacy note, pt to start with TPN at 40 ml/hr, providing 969 kcals and 48g protein.  Per weight records, pt has lost 5 lbs since 2/11 (3% wt loss x 3.5 months, insignificant for time frame).  I/Os: +3.5L since admit UOP: 1905 ml x 24 hrs  Labs reviewed. Medications reviewed.  NUTRITION - FOCUSED PHYSICAL EXAM:  RD working remotely.  Diet Order:   Diet Order            Diet NPO time specified Except for: Ice Chips  Diet effective now              EDUCATION NEEDS:   No education needs have been identified at this time  Skin:  Skin Assessment: Skin Integrity Issues: Skin Integrity  Issues:: Incisions Incisions: abdominal  Last BM:  5/27 -type 7  Height:   Ht Readings from Last 1 Encounters:  10/31/19 6' (1.829 m)    Weight:   Wt Readings from Last 1 Encounters:  10/31/19 72.6 kg    Ideal Body Weight:     BMI:  Body mass index is 21.7 kg/m.  Estimated Nutritional Needs:   Kcal:  2200-2400  Protein:  105-120g  Fluid:  2.2L/day  Clayton Bibles, MS, RD, LDN Inpatient Clinical Dietitian Contact information available via Amion

## 2019-11-07 NOTE — Progress Notes (Signed)
PHARMACY - TOTAL PARENTERAL NUTRITION CONSULT NOTE   Indication: prolonged PO intolerance, bowel rest  Patient Measurements: Height: 6' (182.9 cm) Weight: 72.6 kg (160 lb) IBW/kg (Calculated) : 77.6 TPN AdjBW (KG): 72.6 Body mass index is 21.7 kg/m. Usual Weight: 78-82 kg prior to multiple hospital admissions/surgeries  Assessment: 66 yoM with colorectal cancer who underwent neoadjuvant chemo/rads followed by low anterior resection [id number], complicated by breakdown of the rectal anastomosis with multiple associated intra-abdominal abscesses. Readmitted 5/21 and currently undergoing conservative management with drain placement/antibiotics without repair of anastomotic leak. Inconsistent tolerance of diet over past 6 days and Pharmacy now consulted to dose TPN.  Glucose / Insulin: no Hx DM; SBGs stable WNL prior to TPN Electrolytes: BMP wnl; Mg/Phos pending Renal: SCr stable wnl although slight bump today; UOP adequate LFTs / TGs: WNL; TG pending Prealbumin / albumin: Albumin low as expected d/t acute illness, prealbumin pending Intake / Output; MIVF: D5-LR @ KVO; 3.8L+ this admission GI Imaging: - 5/21 CT A/P: anastomotic breakdown at rectal anastomosis w/ extraluminal stool; intra-abdominal abscess, SBO - 5/27 CT A/P: all abscesses reduced in size; concern for ileus or possibly distal SBO Surgeries / Procedures: - 5/5 LAR (see above) - 5/22 CT-guided drain placement x 3 (2 in RLQ, 1 transgluteal): purulent fluid +/- fecal material removed from all drains   Central access: PICC ordered TPN start date: 5/28 (pending PICC placement)  Nutritional Goals  RD recs pending  Current Nutrition:  NPO and TPN  Plan:   Start TPN at 40 mL/hr at 1800  Electrolytes in TPN: Na 45mEq/L, K 8mEq/L, Ca 46mEq/L, Mg 59mEq/L, Phos 28mmol/L; Cl:Ac ratio 1:1  Add standard MVI and trace elements to TPN  Initiate Sensitive q8h SSI and adjust as needed   Continue M[facility]F at [facility]  Monitor TPN labs on  Mon/Thurs; full panel tomorrow  Carnisha Feltz A 11/07/2019,8:41 AM

## 2019-11-08 LAB — DIFFERENTIAL
Abs Immature Granulocytes: 0.04 10*3/uL (ref 0.00–0.07)
Basophils Absolute: 0 10*3/uL (ref 0.0–0.1)
Basophils Relative: 0 %
Eosinophils Absolute: 0.2 10*3/uL (ref 0.0–0.5)
Eosinophils Relative: 2 %
Immature Granulocytes: 0 %
Lymphocytes Relative: 5 %
Lymphs Abs: 0.5 10*3/uL — ABNORMAL LOW (ref 0.7–4.0)
Monocytes Absolute: 0.7 10*3/uL (ref 0.1–1.0)
Monocytes Relative: 8 %
Neutro Abs: 7.6 10*3/uL (ref 1.7–7.7)
Neutrophils Relative %: 85 %

## 2019-11-08 LAB — COMPREHENSIVE METABOLIC PANEL
ALT: 27 U/L (ref 0–44)
AST: 18 U/L (ref 15–41)
Albumin: 2.6 g/dL — ABNORMAL LOW (ref 3.5–5.0)
Alkaline Phosphatase: 191 U/L — ABNORMAL HIGH (ref 38–126)
Anion gap: 9 (ref 5–15)
BUN: 7 mg/dL (ref 6–20)
CO2: 26 mmol/L (ref 22–32)
Calcium: 8.7 mg/dL — ABNORMAL LOW (ref 8.9–10.3)
Chloride: 102 mmol/L (ref 98–111)
Creatinine, Ser: 0.77 mg/dL (ref 0.61–1.24)
GFR calc Af Amer: >60 mL/min (ref >60)
GFR calc non Af Amer: >60 mL/min (ref >60)
Glucose, Bld: 124 mg/dL — ABNORMAL HIGH (ref 70–99)
Potassium: 3.8 mmol/L (ref 3.5–5.1)
Sodium: 137 mmol/L (ref 135–145)
Total Bilirubin: 0.6 mg/dL (ref 0.3–1.2)
Total Protein: 6.4 g/dL — ABNORMAL LOW (ref 6.5–8.1)

## 2019-11-08 LAB — CBC
HCT: 28.9 % — ABNORMAL LOW (ref 39.0–52.0)
Hemoglobin: 9.2 g/dL — ABNORMAL LOW (ref 13.0–17.0)
MCH: 29.4 pg (ref 26.0–34.0)
MCHC: 31.8 g/dL (ref 30.0–36.0)
MCV: 92.3 fL (ref 80.0–100.0)
Platelets: 480 10*3/uL — ABNORMAL HIGH (ref 150–400)
RBC: 3.13 MIL/uL — ABNORMAL LOW (ref 4.22–5.81)
RDW: 13.3 % (ref 11.5–15.5)
WBC: 9 10*3/uL (ref 4.0–10.5)
nRBC: 0 % (ref 0.0–0.2)

## 2019-11-08 LAB — GLUCOSE, CAPILLARY
Glucose-Capillary: 123 mg/dL — ABNORMAL HIGH (ref 70–99)
Glucose-Capillary: 127 mg/dL — ABNORMAL HIGH (ref 70–99)
Glucose-Capillary: 115 mg/dL — ABNORMAL HIGH (ref 70–99)

## 2019-11-08 LAB — MAGNESIUM: Magnesium: 2 mg/dL (ref 1.7–2.4)

## 2019-11-08 LAB — TRIGLYCERIDES: Triglycerides: 137 mg/dL (ref <150)

## 2019-11-08 LAB — PREALBUMIN: Prealbumin: 12.4 mg/dL — ABNORMAL LOW (ref 18–38)

## 2019-11-08 LAB — PHOSPHORUS: Phosphorus: 3.6 mg/dL (ref 2.5–4.6)

## 2019-11-08 MED ORDER — TRAVASOL 10 % IV SOLN
INTRAVENOUS | Status: AC
  Filled 2019-11-08: qty 748.8

## 2019-11-08 NOTE — Progress Notes (Signed)
PHARMACY - TOTAL PARENTERAL NUTRITION CONSULT NOTE   Indication: prolonged PO intolerance, bowel rest  Patient Measurements: Height: 6' (182.9 cm) Weight: 72.6 kg (160 lb) IBW/kg (Calculated) : 77.6 TPN AdjBW (KG): 72.6 Body mass index is 21.7 kg/m. Usual Weight: 78-82 kg prior to multiple hospital admissions/surgeries  Assessment: 58 yoM with colorectal cancer who underwent neoadjuvant chemo/rads followed by low anterior resection [id number], complicated by breakdown of the rectal anastomosis with multiple associated intra-abdominal abscesses. Readmitted 5/21 and currently undergoing conservative management with drain placement/antibiotics without repair of anastomotic leak. Inconsistent tolerance of diet over past 6 days and Pharmacy now consulted to dose TPN.  Glucose / Insulin: no Hx DM; CBGs < 150, WNL prior to TPN Electrolytes: BMP wnl; Mg/Phos wnl Renal: SCr stable wnl; UOP adequate LFTs / TGs: WNL; TG 137 (5/29) Prealbumin / albumin: Albumin low as expected d/t acute illness, prealbumin 12.4 (5/29) Intake / Output; MIVF: D5-LR @ KVO; 3L+ this admission GI Imaging: - 5/21 CT A/P: anastomotic breakdown at rectal anastomosis w/ extraluminal stool; intra-abdominal abscess, SBO - 5/27 CT A/P: all abscesses reduced in size; concern for ileus or possibly distal SBO Surgeries / Procedures: - 5/5 LAR (see above) - 5/22 CT-guided drain placement x 3 (2 in RLQ, 1 transgluteal): purulent fluid +/- fecal material removed from all drains  Central access: PICC  TPN start date: 5/28   Nutritional Goals  RD recs 5/28: Kcal:  2200-2400, Protein:  105-120g, Fluid:  2.2L/day  TPN at goal rate 64ml/hr will provide 2198kcal and 112g protein  Current Nutrition:  NPO and TPN  Plan:   Increase TPN to 60 mL/hr at 1800  Electrolytes in TPN: Na 19mEq/L, increase K 28mEq/L, Ca 52mEq/L, Mg 60mEq/L, Phos 26mmol/L; Cl:Ac ratio 1:1  Add standard MVI and trace elements to TPN  Initiate Sensitive q8h  SSI and adjust as needed   [facility] at [facility]  Monitor TPN labs on Mon/Thurs; full panel tomorrow  Peggyann Juba, PharmD, BCPS Pharmacy: [phone] 11/08/2019,8:19 AM

## 2019-11-08 NOTE — Progress Notes (Signed)
Postprocedural intraabdominal abscess  Subjective: Had trouble with pain and bloating yesterday.  Somewhat improved today but still having quite a bit of pain  Objective: Vital signs in last 24 hours: Temp:  [98.5 F (36.9 C)-99.4 F (37.4 C)] 98.6 F (37 C) (05/29 0552) Pulse Rate:  [58-73] 73 (05/29 0552) Resp:  [16-20] 20 (05/29 0552) BP: (126-140)/(80-97) 140/97 (05/29 0552) SpO2:  [94 %-97 %] 97 % (05/29 0552) Last BM Date: 11/06/19  Intake/Output from previous day: 05/28 0701 - 05/29 0700 In: 1134.3 [P.O.:210; I.V.:599.5; IV Piggyback:314.8] Out: 1920 [Urine:1850; Drains:70] Intake/Output this shift: No intake/output data recorded.  General appearance: alert and cooperative GI: normal findings: soft, non-distended, appropriately tender Incision/Wound: clean, dry, intact  Lab Results:  Results for orders placed or performed during the hospital encounter of 10/31/19 (from the past 24 hour(s))  Glucose, capillary     Status: Abnormal   Collection Time: 11/07/19  9:22 PM  Result Value Ref Range   Glucose-Capillary 104 (H) 70 - 99 mg/dL  Comprehensive metabolic panel     Status: Abnormal   Collection Time: 11/08/19  4:34 AM  Result Value Ref Range   Sodium 137 135 - 145 mmol/L   Potassium 3.8 3.5 - 5.1 mmol/L   Chloride 102 98 - 111 mmol/L   CO2 26 22 - 32 mmol/L   Glucose, Bld 124 (H) 70 - 99 mg/dL   BUN 7 6 - 20 mg/dL   Creatinine, Ser 0.77 0.61 - 1.24 mg/dL   Calcium 8.7 (L) 8.9 - 10.3 mg/dL   Total Protein 6.4 (L) 6.5 - 8.1 g/dL   Albumin 2.6 (L) 3.5 - 5.0 g/dL   AST 18 15 - 41 U/L   ALT 27 0 - 44 U/L   Alkaline Phosphatase 191 (H) 38 - 126 U/L   Total Bilirubin 0.6 0.3 - 1.2 mg/dL   GFR calc non Af Amer >60 >60 mL/min   GFR calc Af Amer >60 >60 mL/min   Anion gap 9 5 - 15  Prealbumin     Status: Abnormal   Collection Time: 11/08/19  4:34 AM  Result Value Ref Range   Prealbumin 12.4 (L) 18 - 38 mg/dL  Magnesium     Status: None   Collection Time:  11/08/19  4:34 AM  Result Value Ref Range   Magnesium 2.0 1.7 - 2.4 mg/dL  Phosphorus     Status: None   Collection Time: 11/08/19  4:34 AM  Result Value Ref Range   Phosphorus 3.6 2.5 - 4.6 mg/dL  Triglycerides     Status: None   Collection Time: 11/08/19  4:34 AM  Result Value Ref Range   Triglycerides 137 <150 mg/dL  CBC     Status: Abnormal   Collection Time: 11/08/19  4:34 AM  Result Value Ref Range   WBC 9.0 4.0 - 10.5 K/uL   RBC 3.13 (L) 4.22 - 5.81 MIL/uL   Hemoglobin 9.2 (L) 13.0 - 17.0 g/dL   HCT 28.9 (L) 39.0 - 52.0 %   MCV 92.3 80.0 - 100.0 fL   MCH 29.4 26.0 - 34.0 pg   MCHC 31.8 30.0 - 36.0 g/dL   RDW 13.3 11.5 - 15.5 %   Platelets 480 (H) 150 - 400 K/uL   nRBC 0.0 0.0 - 0.2 %  Differential     Status: Abnormal   Collection Time: 11/08/19  4:34 AM  Result Value Ref Range   Neutrophils Relative % 85 %   Neutro Abs 7.6  1.7 - 7.7 K/uL   Lymphocytes Relative 5 %   Lymphs Abs 0.5 (L) 0.7 - 4.0 K/uL   Monocytes Relative 8 %   Monocytes Absolute 0.7 0.1 - 1.0 K/uL   Eosinophils Relative 2 %   Eosinophils Absolute 0.2 0.0 - 0.5 K/uL   Basophils Relative 0 %   Basophils Absolute 0.0 0.0 - 0.1 K/uL   Immature Granulocytes 0 %   Abs Immature Granulocytes 0.04 0.00 - 0.07 K/uL  Glucose, capillary     Status: Abnormal   Collection Time: 11/08/19  6:49 AM  Result Value Ref Range   Glucose-Capillary 123 (H) 70 - 99 mg/dL     Studies/Results Radiology: CT shows improving fluid collections, possible SBO     MEDS, Scheduled . acetaminophen  500 mg Oral TID WC & HS  . Chlorhexidine Gluconate Cloth  6 each Topical Daily  . enoxaparin (LOVENOX) injection  40 mg Subcutaneous Q24H  . gabapentin  600 mg Oral TID  . insulin aspart  0-9 Units Subcutaneous Q8H  . lip balm  1 application Topical BID  . metoprolol tartrate  12.5 mg Oral BID  . Ensure Max Protein  11 oz Oral BID  . sodium chloride flush  5 mL Intracatheter Q8H     Assessment: Postprocedural  intraabdominal abscess Cont IV abx, Drains: per IR  Plan: NPO Cont PICC and TPN due to intolerance of PO.  Anticipate he will need TPN at home as well Ambulate in hall [place] to shower if drains are covered  LOS: 7 days    Rosario Adie, MD [facility], [place]    11/08/2019 8:34 AM

## 2019-11-09 LAB — PHOSPHORUS: Phosphorus: 3.5 mg/dL (ref 2.5–4.6)

## 2019-11-09 LAB — COMPREHENSIVE METABOLIC PANEL
ALT: 23 U/L (ref 0–44)
AST: 17 U/L (ref 15–41)
Albumin: 2.6 g/dL — ABNORMAL LOW (ref 3.5–5.0)
Alkaline Phosphatase: 181 U/L — ABNORMAL HIGH (ref 38–126)
Anion gap: 8 (ref 5–15)
BUN: 9 mg/dL (ref 6–20)
CO2: 26 mmol/L (ref 22–32)
Calcium: 8.7 mg/dL — ABNORMAL LOW (ref 8.9–10.3)
Chloride: 106 mmol/L (ref 98–111)
Creatinine, Ser: 0.8 mg/dL (ref 0.61–1.24)
GFR calc Af Amer: >60 mL/min (ref >60)
GFR calc non Af Amer: >60 mL/min (ref >60)
Glucose, Bld: 117 mg/dL — ABNORMAL HIGH (ref 70–99)
Potassium: 3.9 mmol/L (ref 3.5–5.1)
Sodium: 140 mmol/L (ref 135–145)
Total Bilirubin: 0.7 mg/dL (ref 0.3–1.2)
Total Protein: 6.4 g/dL — ABNORMAL LOW (ref 6.5–8.1)

## 2019-11-09 LAB — GLUCOSE, CAPILLARY
Glucose-Capillary: 109 mg/dL — ABNORMAL HIGH (ref 70–99)
Glucose-Capillary: 117 mg/dL — ABNORMAL HIGH (ref 70–99)
Glucose-Capillary: 118 mg/dL — ABNORMAL HIGH (ref 70–99)
Glucose-Capillary: 127 mg/dL — ABNORMAL HIGH (ref 70–99)
Glucose-Capillary: 133 mg/dL — ABNORMAL HIGH (ref 70–99)

## 2019-11-09 LAB — MAGNESIUM: Magnesium: 2 mg/dL (ref 1.7–2.4)

## 2019-11-09 MED ORDER — TRAVASOL 10 % IV SOLN
INTRAVENOUS | Status: AC
  Filled 2019-11-09: qty 936

## 2019-11-09 NOTE — Progress Notes (Signed)
PHARMACY - TOTAL PARENTERAL NUTRITION CONSULT NOTE   Indication: prolonged PO intolerance, bowel rest  Patient Measurements: Height: 6' (182.9 cm) Weight: 72.6 kg (160 lb) IBW/kg (Calculated) : 77.6 TPN AdjBW (KG): 72.6 Body mass index is 21.7 kg/m. Usual Weight: 78-82 kg prior to multiple hospital admissions/surgeries  Assessment: 37 yoM with colorectal cancer who underwent neoadjuvant chemo/rads followed by low anterior resection [id number], complicated by breakdown of the rectal anastomosis with multiple associated intra-abdominal abscesses. Readmitted 5/21 and currently undergoing conservative management with drain placement/antibiotics without repair of anastomotic leak. Inconsistent tolerance of diet over past 6 days and Pharmacy now consulted to dose TPN.  Glucose / Insulin: no Hx DM; CBGs < 150, no SSI required Electrolytes: BMP wnl; Mg/Phos wnl Renal: SCr stable wnl; UOP adequate LFTs / TGs: WNL; TG 137 (5/29) Prealbumin / albumin: Albumin low as expected d/t acute illness, prealbumin 12.4 (5/29) Intake / Output; MIVF: D5-LR @ KVO; 2.6L+ this admission GI Imaging: - 5/21 CT A/P: anastomotic breakdown at rectal anastomosis w/ extraluminal stool; intra-abdominal abscess, SBO - 5/27 CT A/P: all abscesses reduced in size; concern for ileus or possibly distal SBO Surgeries / Procedures: - 5/5 LAR (see above) - 5/22 CT-guided drain placement x 3 (2 in RLQ, 1 transgluteal): purulent fluid +/- fecal material removed from all drains  Central access: PICC  TPN start date: 5/28   Nutritional Goals  RD recs 5/28: Kcal:  2200-2400, Protein:  105-120g, Fluid:  2.2L/day  TPN at goal rate 68ml/hr will provide 2198kcal and 112g protein  Current Nutrition:  NPO and TPN  Plan:   Increase TPN to 75 mL/hr at 1800  Electrolytes in TPN: Na 58mEq/L, K 13mEq/L, Ca 68mEq/L, Mg 48mEq/L, Phos 74mmol/L; Cl:Ac ratio 1:1  Add standard MVI and trace elements to TPN  Initiate Sensitive q8h SSI and  adjust as needed   Continue MIVF at [facility]  Monitor TPN labs on Mon/Thurs  Peggyann Juba, PharmD, BCPS Pharmacy: [phone] 11/09/2019,8:06 AM

## 2019-11-09 NOTE — Plan of Care (Signed)

## 2019-11-09 NOTE — Progress Notes (Signed)
Postprocedural intraabdominal abscess  Subjective: Still having trouble with pain after defecation.    Objective: Vital signs in last 24 hours: Temp:  [98.4 F (36.9 C)-98.9 F (37.2 C)] 98.9 F (37.2 C) (05/30 0605) Pulse Rate:  [63-68] 63 (05/30 0605) Resp:  [16-17] 16 (05/30 0605) BP: (129-137)/(87-93) 134/90 (05/30 0605) SpO2:  [96 %-100 %] 96 % (05/30 0605) Last BM Date: 11/08/19  Intake/Output from previous day: 05/29 0701 - 05/30 0700 In: 1933.4 [P.O.:350; I.V.:1351.8; IV Piggyback:136.6] Out: 2355 [Urine:2275; Drains:80] Intake/Output this shift: No intake/output data recorded.  General appearance: alert and cooperative GI: normal findings: soft, non-distended, appropriately tender Incision/Wound: clean, dry, intact  Lab Results:  Results for orders placed or performed during the hospital encounter of 10/31/19 (from the past 24 hour(s))  Glucose, capillary     Status: Abnormal   Collection Time: 11/08/19 12:28 PM  Result Value Ref Range   Glucose-Capillary 127 (H) 70 - 99 mg/dL  Glucose, capillary     Status: Abnormal   Collection Time: 11/08/19  5:45 PM  Result Value Ref Range   Glucose-Capillary 115 (H) 70 - 99 mg/dL  Glucose, capillary     Status: Abnormal   Collection Time: 11/09/19 12:19 AM  Result Value Ref Range   Glucose-Capillary 133 (H) 70 - 99 mg/dL  Comprehensive metabolic panel     Status: Abnormal   Collection Time: 11/09/19  3:00 AM  Result Value Ref Range   Sodium 140 135 - 145 mmol/L   Potassium 3.9 3.5 - 5.1 mmol/L   Chloride 106 98 - 111 mmol/L   CO2 26 22 - 32 mmol/L   Glucose, Bld 117 (H) 70 - 99 mg/dL   BUN 9 6 - 20 mg/dL   Creatinine, Ser 0.80 0.61 - 1.24 mg/dL   Calcium 8.7 (L) 8.9 - 10.3 mg/dL   Total Protein 6.4 (L) 6.5 - 8.1 g/dL   Albumin 2.6 (L) 3.5 - 5.0 g/dL   AST 17 15 - 41 U/L   ALT 23 0 - 44 U/L   Alkaline Phosphatase 181 (H) 38 - 126 U/L   Total Bilirubin 0.7 0.3 - 1.2 mg/dL   GFR calc non Af Amer >60 >60 mL/min    GFR calc Af Amer >60 >60 mL/min   Anion gap 8 5 - 15  Magnesium     Status: None   Collection Time: 11/09/19  3:00 AM  Result Value Ref Range   Magnesium 2.0 1.7 - 2.4 mg/dL  Phosphorus     Status: None   Collection Time: 11/09/19  3:00 AM  Result Value Ref Range   Phosphorus 3.5 2.5 - 4.6 mg/dL     Studies/Results Radiology: CT shows improving fluid collections, possible SBO     MEDS, Scheduled . acetaminophen  500 mg Oral TID WC & HS  . Chlorhexidine Gluconate Cloth  6 each Topical Daily  . enoxaparin (LOVENOX) injection  40 mg Subcutaneous Q24H  . gabapentin  600 mg Oral TID  . insulin aspart  0-9 Units Subcutaneous Q8H  . lip balm  1 application Topical BID  . metoprolol tartrate  12.5 mg Oral BID  . sodium chloride flush  5 mL Intracatheter Q8H     Assessment: Postprocedural intraabdominal abscess Cont IV abx, Drains: per IR  Plan: NPO, ok for sips, chips, gum, hard candy and PO meds Cont PICC and TPN due to intolerance of PO.  Anticipate he will need TPN at home as well Ambulate in hall Cont Abx  Ok to shower if drains are covered  LOS: 8 days    Rosario Adie, MD [facility], [place]    11/09/2019 7:46 AM

## 2019-11-09 NOTE — Plan of Care (Signed)

## 2019-11-10 LAB — DIFFERENTIAL
Abs Immature Granulocytes: 0.03 10*3/uL (ref 0.00–0.07)
Basophils Absolute: 0 10*3/uL (ref 0.0–0.1)
Basophils Relative: 0 %
Eosinophils Absolute: 0.3 10*3/uL (ref 0.0–0.5)
Eosinophils Relative: 4 %
Immature Granulocytes: 0 %
Lymphocytes Relative: 8 %
Lymphs Abs: 0.6 10*3/uL — ABNORMAL LOW (ref 0.7–4.0)
Monocytes Absolute: 0.6 10*3/uL (ref 0.1–1.0)
Monocytes Relative: 8 %
Neutro Abs: 5.6 10*3/uL (ref 1.7–7.7)
Neutrophils Relative %: 80 %

## 2019-11-10 LAB — PHOSPHORUS: Phosphorus: 3.7 mg/dL (ref 2.5–4.6)

## 2019-11-10 LAB — GLUCOSE, CAPILLARY
Glucose-Capillary: 120 mg/dL — ABNORMAL HIGH (ref 70–99)
Glucose-Capillary: 108 mg/dL — ABNORMAL HIGH (ref 70–99)
Glucose-Capillary: 116 mg/dL — ABNORMAL HIGH (ref 70–99)
Glucose-Capillary: 119 mg/dL — ABNORMAL HIGH (ref 70–99)

## 2019-11-10 LAB — COMPREHENSIVE METABOLIC PANEL
ALT: 24 U/L (ref 0–44)
AST: 16 U/L (ref 15–41)
Albumin: 2.7 g/dL — ABNORMAL LOW (ref 3.5–5.0)
Alkaline Phosphatase: 188 U/L — ABNORMAL HIGH (ref 38–126)
Anion gap: 10 (ref 5–15)
BUN: 12 mg/dL (ref 6–20)
CO2: 25 mmol/L (ref 22–32)
Calcium: 8.9 mg/dL (ref 8.9–10.3)
Chloride: 105 mmol/L (ref 98–111)
Creatinine, Ser: 0.82 mg/dL (ref 0.61–1.24)
GFR calc Af Amer: >60 mL/min (ref >60)
GFR calc non Af Amer: >60 mL/min (ref >60)
Glucose, Bld: 116 mg/dL — ABNORMAL HIGH (ref 70–99)
Potassium: 4.6 mmol/L (ref 3.5–5.1)
Sodium: 140 mmol/L (ref 135–145)
Total Bilirubin: 0.5 mg/dL (ref 0.3–1.2)
Total Protein: 6.7 g/dL (ref 6.5–8.1)

## 2019-11-10 LAB — CBC
HCT: 31.1 % — ABNORMAL LOW (ref 39.0–52.0)
Hemoglobin: 9.9 g/dL — ABNORMAL LOW (ref 13.0–17.0)
MCH: 29.7 pg (ref 26.0–34.0)
MCHC: 31.8 g/dL (ref 30.0–36.0)
MCV: 93.4 fL (ref 80.0–100.0)
Platelets: 492 10*3/uL — ABNORMAL HIGH (ref 150–400)
RBC: 3.33 MIL/uL — ABNORMAL LOW (ref 4.22–5.81)
RDW: 13.3 % (ref 11.5–15.5)
WBC: 7 10*3/uL (ref 4.0–10.5)
nRBC: 0 % (ref 0.0–0.2)

## 2019-11-10 LAB — MAGNESIUM: Magnesium: 2.1 mg/dL (ref 1.7–2.4)

## 2019-11-10 LAB — PREALBUMIN: Prealbumin: 15 mg/dL — ABNORMAL LOW (ref 18–38)

## 2019-11-10 LAB — TRIGLYCERIDES: Triglycerides: 138 mg/dL (ref <150)

## 2019-11-10 MED ORDER — TRAVASOL 10 % IV SOLN
INTRAVENOUS | Status: AC
  Filled 2019-11-10: qty 1123.2

## 2019-11-10 MED ORDER — INSULIN ASPART 100 UNIT/ML ~~LOC~~ SOLN: 0.0000 [IU] | SUBCUTANEOUS | Status: DC

## 2019-11-10 NOTE — Progress Notes (Signed)
Postprocedural intraabdominal abscess  Subjective: C/o pain at buttock drain site.  Hurts to sit  Objective: Vital signs in last 24 hours: Temp:  [98.7 F (37.1 C)-99.2 F (37.3 C)] 99.2 F (37.3 C) (05/31 0527) Pulse Rate:  [57-89] 62 (05/31 0527) Resp:  [16-18] 16 (05/31 0527) BP: (126-141)/(74-98) 141/84 (05/31 0527) SpO2:  [97 %-100 %] 99 % (05/31 0527) Last BM Date: 11/09/19  Intake/Output from previous day: 05/30 0701 - 05/31 0700 In: 1887.1 [P.O.:190; I.V.:1162.5; IV Piggyback:524.6] Out: 2122 [Urine:2025; Drains:97] Intake/Output this shift: No intake/output data recorded.  General appearance: alert and cooperative GI: normal findings: soft, non-distended, appropriately tender Incision/Wound: clean, dry, intact Buttock drain leaking stool around it  Lab Results:  Results for orders placed or performed during the hospital encounter of 10/31/19 (from the past 24 hour(s))  Glucose, capillary     Status: Abnormal   Collection Time: 11/09/19 12:03 PM  Result Value Ref Range   Glucose-Capillary 127 (H) 70 - 99 mg/dL  Glucose, capillary     Status: Abnormal   Collection Time: 11/09/19  6:03 PM  Result Value Ref Range   Glucose-Capillary 118 (H) 70 - 99 mg/dL  Glucose, capillary     Status: Abnormal   Collection Time: 11/09/19  8:10 PM  Result Value Ref Range   Glucose-Capillary 109 (H) 70 - 99 mg/dL  Comprehensive metabolic panel     Status: Abnormal   Collection Time: 11/10/19  3:00 AM  Result Value Ref Range   Sodium 140 135 - 145 mmol/L   Potassium 4.6 3.5 - 5.1 mmol/L   Chloride 105 98 - 111 mmol/L   CO2 25 22 - 32 mmol/L   Glucose, Bld 116 (H) 70 - 99 mg/dL   BUN 12 6 - 20 mg/dL   Creatinine, Ser 0.82 0.61 - 1.24 mg/dL   Calcium 8.9 8.9 - 10.3 mg/dL   Total Protein 6.7 6.5 - 8.1 g/dL   Albumin 2.7 (L) 3.5 - 5.0 g/dL   AST 16 15 - 41 U/L   ALT 24 0 - 44 U/L   Alkaline Phosphatase 188 (H) 38 - 126 U/L   Total Bilirubin 0.5 0.3 - 1.2 mg/dL   GFR calc non  Af Amer >60 >60 mL/min   GFR calc Af Amer >60 >60 mL/min   Anion gap 10 5 - 15  Magnesium     Status: None   Collection Time: 11/10/19  3:00 AM  Result Value Ref Range   Magnesium 2.1 1.7 - 2.4 mg/dL  Phosphorus     Status: None   Collection Time: 11/10/19  3:00 AM  Result Value Ref Range   Phosphorus 3.7 2.5 - 4.6 mg/dL  CBC     Status: Abnormal   Collection Time: 11/10/19  3:00 AM  Result Value Ref Range   WBC 7.0 4.0 - 10.5 K/uL   RBC 3.33 (L) 4.22 - 5.81 MIL/uL   Hemoglobin 9.9 (L) 13.0 - 17.0 g/dL   HCT 31.1 (L) 39.0 - 52.0 %   MCV 93.4 80.0 - 100.0 fL   MCH 29.7 26.0 - 34.0 pg   MCHC 31.8 30.0 - 36.0 g/dL   RDW 13.3 11.5 - 15.5 %   Platelets 492 (H) 150 - 400 K/uL   nRBC 0.0 0.0 - 0.2 %  Differential     Status: Abnormal   Collection Time: 11/10/19  3:00 AM  Result Value Ref Range   Neutrophils Relative % 80 %   Neutro Abs 5.6 1.7 -  7.7 K/uL   Lymphocytes Relative 8 %   Lymphs Abs 0.6 (L) 0.7 - 4.0 K/uL   Monocytes Relative 8 %   Monocytes Absolute 0.6 0.1 - 1.0 K/uL   Eosinophils Relative 4 %   Eosinophils Absolute 0.3 0.0 - 0.5 K/uL   Basophils Relative 0 %   Basophils Absolute 0.0 0.0 - 0.1 K/uL   Immature Granulocytes 0 %   Abs Immature Granulocytes 0.03 0.00 - 0.07 K/uL  Triglycerides     Status: None   Collection Time: 11/10/19  3:00 AM  Result Value Ref Range   Triglycerides 138 <150 mg/dL  Prealbumin     Status: Abnormal   Collection Time: 11/10/19  3:00 AM  Result Value Ref Range   Prealbumin 15.0 (L) 18 - 38 mg/dL  Glucose, capillary     Status: Abnormal   Collection Time: 11/10/19  6:03 AM  Result Value Ref Range   Glucose-Capillary 120 (H) 70 - 99 mg/dL     Studies/Results Radiology: CT shows improving fluid collections, possible SBO     MEDS, Scheduled . acetaminophen  500 mg Oral TID WC & HS  . Chlorhexidine Gluconate Cloth  6 each Topical Daily  . enoxaparin (LOVENOX) injection  40 mg Subcutaneous Q24H  . gabapentin  600 mg Oral TID   . insulin aspart  0-9 Units Subcutaneous Q8H  . lip balm  1 application Topical BID  . metoprolol tartrate  12.5 mg Oral BID  . sodium chloride flush  5 mL Intracatheter Q8H     Assessment: Postprocedural intraabdominal abscess Cont IV abx, Drains: per IR  Plan: NPO, ok for sips, chips, gum, hard candy and PO meds Cont PICC and TPN due to intolerance of PO.  Anticipate he will need TPN at home as well.  Will start to cycle Ambulate in hall Stop Abx: completed course Ok to shower if drains are covered Drain site pain: Will try flushing transgluteal drain more often   LOS: 9 days    Rosario Adie, MD [facility], [place]    11/10/2019 8:02 AM

## 2019-11-10 NOTE — Progress Notes (Signed)
PATIENT IS UTILIZING K-PAD WITH THIS NURSE ASSISTANCE. STILL DENIES NAUSEA, HAS AMBULATED IN HALLWAY SEVERAL TIMES TODAY- ENCOURAGING PATIENT TO AMBULATE.

## 2019-11-10 NOTE — Progress Notes (Signed)
PATIENT DRAINS FLUSHED WITH 5 CC NS PER DR THOMAS ORDER.

## 2019-11-10 NOTE — Progress Notes (Signed)
NOTIFIED ONCALL CHRISTOPHER WHITE FOR [facility] THAT PATIENT IS REQUIRING PAIN MEDICATION PER Q2 HR AND ABD IS DISTENDED. NO NEW ORDERS GIVEN- CONTINUE BOWEL REST, AMBULATION, HEAT/COLD THERAPY, PAIN MANAGEMENT. NGT INSERTION ONLY IF PATIENT BECOMES NAUSEATED. PATIENT DENIES NAUSEA, ADMITS ABDOMINAL PAIN. CALL BELL WITHIN REACH.

## 2019-11-10 NOTE — Progress Notes (Signed)
PHARMACY - TOTAL PARENTERAL NUTRITION CONSULT NOTE   Indication: prolonged PO intolerance, bowel rest  Patient Measurements: Height: 6' (182.9 cm) Weight: 72.6 kg (160 lb) IBW/kg (Calculated) : 77.6 TPN AdjBW (KG): 72.6 Body mass index is 21.7 kg/m. Usual Weight: 78-82 kg prior to multiple hospital admissions/surgeries  Assessment: 30 yoM with colorectal cancer who underwent neoadjuvant chemo/rads followed by low anterior resection [id number], complicated by breakdown of the rectal anastomosis with multiple associated intra-abdominal abscesses. Readmitted 5/21 and currently undergoing conservative management with drain placement/antibiotics without repair of anastomotic leak. Inconsistent tolerance of diet over past 6 days and Pharmacy now consulted to dose TPN.  Glucose / Insulin: no Hx DM; CBGs < 150, no SSI required Electrolytes: BMP wnl; Mg/Phos wnl Renal: SCr stable wnl; UOP adequate LFTs / TGs: WNL; TG 138 Prealbumin / albumin: Albumin low as expected d/t acute illness, prealbumin 12.4 > 15 Intake / Output; MIVF: D5-LR @ KVO; -2.6L this admission GI Imaging: - 5/21 CT A/P: anastomotic breakdown at rectal anastomosis w/ extraluminal stool; intra-abdominal abscess, SBO - 5/27 CT A/P: all abscesses reduced in size; concern for ileus or possibly distal SBO Surgeries / Procedures: - 5/5 LAR (see above) - 5/22 CT-guided drain placement x 3 (2 in RLQ, 1 transgluteal): purulent fluid +/- fecal material removed from all drains  Central access: PICC  TPN start date: 5/28   Nutritional Goals  RD recs 5/28: Kcal:  2200-2400, Protein:  105-120g, Fluid:  2.2L/day  TPN at goal rate 66ml/hr will provide 2198kcal and 112g protein - changing to cyclic TPN on [id number]  Current Nutrition:  NPO and TPN  Plan:   Will increase TPN to goal fluid amount per 62ml/hr cont goal rate - 2.16L over [id number] - with cyclic rate, 1st day will cycle to 18 hours and as patient tolerates cyclic TPN, will change to  12 hr cycle - for today, 64 ml/hr x 1 hr, 127 ml/hr x 16hr, 64 ml/hr x 1 hr  Electrolytes in TPN: Na 66mEq/L, K 74mEq/L, Ca 68mEq/L, Mg 74mEq/L, Phos 86mmol/L; Cl:Ac ratio 1:1  Add standard MVI and trace elements to TPN  Continue sensitive scale SSI but will change times due to cycle TPN - check CBG 2 hours after TPN initiation, once at 0600, once 1 hr after TPN stopped and once when TPN is off  Continue MIVF at [facility]  Monitor TPN labs on Mon/Thurs   Adrian Saran, PharmD, BCPS [phone] 11/10/2019 8:41 AM

## 2019-11-10 NOTE — Plan of Care (Signed)

## 2019-11-10 NOTE — Progress Notes (Signed)
Patient resting in bed, pain 7/10, medicated with dilaudid per mar, abd firm and distended, per patient he has passed a small amount of flatus today and had several small BM's, +bsx4, right buttock drain site with some drainage around the site and through the dressing, RLQ drain site dressing CDI, both drains flushed with 5cc saline per order, flushed without difficulty, patient encouraged to ambulate in the hallways, remains npo with sips and chips per order, voiding qs, vss, will continue to monitor.

## 2019-11-10 NOTE — Progress Notes (Signed)
Patient drains flushed per Dr. [name] order. Call bell within reach.

## 2019-11-10 NOTE — Progress Notes (Signed)
PATIENT DRAINS FLUSHED WITH 5 CC NS PER DR. THOMAS ORDER.

## 2019-11-11 ENCOUNTER — Inpatient Hospital Stay (HOSPITAL_COMMUNITY): Payer: Commercial Managed Care - PPO

## 2019-11-11 HISTORY — PX: IR CATHETER TUBE CHANGE: IMG717

## 2019-11-11 LAB — MAGNESIUM: Magnesium: 2 mg/dL (ref 1.7–2.4)

## 2019-11-11 LAB — COMPREHENSIVE METABOLIC PANEL
ALT: 22 U/L (ref 0–44)
AST: 15 U/L (ref 15–41)
Albumin: 2.7 g/dL — ABNORMAL LOW (ref 3.5–5.0)
Alkaline Phosphatase: 163 U/L — ABNORMAL HIGH (ref 38–126)
Anion gap: 10 (ref 5–15)
BUN: 15 mg/dL (ref 6–20)
CO2: 24 mmol/L (ref 22–32)
Calcium: 8.8 mg/dL — ABNORMAL LOW (ref 8.9–10.3)
Chloride: 103 mmol/L (ref 98–111)
Creatinine, Ser: 0.68 mg/dL (ref 0.61–1.24)
GFR calc Af Amer: >60 mL/min (ref >60)
GFR calc non Af Amer: >60 mL/min (ref >60)
Glucose, Bld: 130 mg/dL — ABNORMAL HIGH (ref 70–99)
Potassium: 4.3 mmol/L (ref 3.5–5.1)
Sodium: 137 mmol/L (ref 135–145)
Total Bilirubin: 0.5 mg/dL (ref 0.3–1.2)
Total Protein: 6.6 g/dL (ref 6.5–8.1)

## 2019-11-11 LAB — PHOSPHORUS: Phosphorus: 3.4 mg/dL (ref 2.5–4.6)

## 2019-11-11 LAB — GLUCOSE, CAPILLARY
Glucose-Capillary: 115 mg/dL — ABNORMAL HIGH (ref 70–99)
Glucose-Capillary: 114 mg/dL — ABNORMAL HIGH (ref 70–99)
Glucose-Capillary: 103 mg/dL — ABNORMAL HIGH (ref 70–99)
Glucose-Capillary: 105 mg/dL — ABNORMAL HIGH (ref 70–99)

## 2019-11-11 MED ORDER — IOHEXOL 300 MG/ML  SOLN
50.0000 mL | Freq: Once | INTRAMUSCULAR | Status: AC | PRN
  Administered 2019-11-11: 5 mL

## 2019-11-11 MED ORDER — LIDOCAINE HCL 1 % IJ SOLN
INTRAMUSCULAR | Status: AC
  Filled 2019-11-11: qty 20

## 2019-11-11 MED ORDER — INSULIN ASPART 100 UNIT/ML ~~LOC~~ SOLN: 0.0000 [IU] | SUBCUTANEOUS | Status: DC

## 2019-11-11 MED ORDER — TRAVASOL 10 % IV SOLN
INTRAVENOUS | Status: AC
  Filled 2019-11-11: qty 1123.2

## 2019-11-11 MED ORDER — FENTANYL CITRATE (PF) 100 MCG/2ML IJ SOLN
INTRAMUSCULAR | Status: AC
  Filled 2019-11-11: qty 2

## 2019-11-11 MED ORDER — OXYCODONE HCL 5 MG PO TABS
5.0000 mg | ORAL_TABLET | ORAL | Status: DC | PRN
  Administered 2019-11-11 – 2019-11-22 (×33): 10 mg via ORAL
  Filled 2019-11-11 (×34): qty 2

## 2019-11-11 MED ORDER — FENTANYL CITRATE (PF) 100 MCG/2ML IJ SOLN
INTRAMUSCULAR | Status: AC | PRN
  Administered 2019-11-11: 50 ug via INTRAVENOUS

## 2019-11-11 NOTE — Procedures (Signed)
Interventional Radiology Procedure Note  Procedure: Exchange and repositioning of perirectal abscess drain.    Complications: None  Estimated Blood Loss: None  Recommendations: - Cellulitis at drain insertion site likely due to fecal bacteria - Drain subject to clogging due to frank fecal material.  May need further upsizing in the future   Signed,  Criselda Peaches, MD

## 2019-11-11 NOTE — Progress Notes (Signed)
Pt complains that buttocks drain has increased pain and requested MD be notified. Dr [name] was paged and she requested that IR come look at drain. IR PA notified and agrees to come look. Pt was medicated and given a heat pad and pillow, somewhat able to tolerate a side-lying position. Will continue to monitor.

## 2019-11-11 NOTE — Progress Notes (Signed)
Referring Physician(s): Kechi  Supervising Physician: Jacqulynn Cadet  Patient Status:  [facility] - In-pt  Chief Complaint:  Buttocks pain/abscess  Subjective: Patient complaining of pain at right transgluteal drain site   Allergies: Patient has no known allergies.  Medications: Prior to Admission medications   Medication Sig Start Date End Date Taking? Authorizing Provider  acetaminophen (TYLENOL) 500 MG tablet Take 500 mg by mouth every 6 (six) hours as needed for moderate pain.   Yes [provider]  gabapentin (NEURONTIN) 300 MG capsule Take 2 capsules (600 mg total) by mouth 2 (two) times daily. Patient taking differently: Take 600 mg by mouth 2 (two) times daily as needed (pain).  09/04/19  Yes Owens Shark, NP  ibuprofen (ADVIL) 200 MG tablet Take 400 mg by mouth every 6 (six) hours as needed for moderate pain.   Yes [provider]  LORazepam (ATIVAN) 0.5 MG tablet Take 1 tablet (0.5 mg total) by mouth every 8 (eight) hours as needed for anxiety. Or nausea. Do not drive while taking [id number]  Yes Owens Shark, NP  oxyCODONE (OXY IR/ROXICODONE) 5 MG immediate release tablet Take 1-2 tablets (5-10 mg total) by mouth every 6 (six) hours as needed for moderate pain, severe pain or breakthrough pain. [id number]  Yes Leighton Ruff, MD  lidocaine-prilocaine (EMLA) cream Apply 1 application topically as directed. Apply 1 hour prior to stick and cover with plastic wrap Patient not taking: Reported on 09/25/2019 07/10/19   Ladell Pier, MD     Vital Signs: BP 117/80   Pulse 69   Temp 98.8 F (37.1 C) (Oral)   Resp 18   Ht 6' (1.829 m)   Wt 160 lb (72.6 kg)   SpO2 98%   BMI 21.70 kg/m   Physical Exam awake, alert.  Both right transgluteal drain and lower mid abdominal drain intact; right transgluteal drain insertion site moderately tender to palpation; drain irrigated with return of  primarily air and a small amount of feculent fluid; outputs have  been minimal today  Imaging: No results found.  Labs:  CBC: Recent Labs    11/02/19 0430 11/05/19 0436 11/08/19 0434 11/10/19 0300  WBC 12.0* 11.6* 9.0 7.0  HGB 9.9* 9.5* 9.2* 9.9*  HCT 30.6* 29.1* 28.9* 31.1*  PLT 569* 470* 480* 492*    COAGS: Recent Labs    11/01/19 0731  INR 1.2    BMP: Recent Labs    11/08/19 0434 11/09/19 0300 11/10/19 0300 11/11/19 0340  NA 137 140 140 137  K 3.8 3.9 4.6 4.3  CL 102 106 105 103  CO2 26 26 25 24   GLUCOSE 124* 117* 116* 130*  BUN 7 9 12 15   CALCIUM 8.7* 8.7* 8.9 8.8*  CREATININE 0.77 0.80 0.82 0.68  GFRNONAA >60 >60 >60 >60  GFRAA >60 >60 >60 >60    LIVER FUNCTION TESTS: Recent Labs    11/08/19 0434 11/09/19 0300 11/10/19 0300 11/11/19 0340  BILITOT 0.6 0.7 0.5 0.5  AST 18 17 16 15   ALT 27 23 24 22   ALKPHOS 191* 181* 188* 163*  PROT 6.4* 6.4* 6.7 6.6  ALBUMIN 2.6* 2.6* 2.7* 2.7*    Assessment and Plan: 44 y/o M with history of colorectal cancer s/p robotic assisted low anterior resection 10/15/19 with subsequent development of multiple intra-abdominal abscesses. IR placed abdominal drains x 3 on 5/22; RLQ drain removed 5/28; afebrile, creatinine normal, last WBC yesterday normal; will plan transgluteal drain injection and probable exchange this  afternoon   Electronically Signed: D. Rowe Robert, PA-C 11/11/2019, 3:21 PM   I spent a total of 15 minutes at the the patient's bedside AND on the patient's hospital floor or unit, greater than 50% of which was counseling/coordinating care for abdominal/pelvic abscess drains    Patient ID: Brandon Barry, male   DOB: 17-Jan-1976, 44 y.o.   MRN: [id number]

## 2019-11-11 NOTE — Progress Notes (Signed)
PHARMACY - TOTAL PARENTERAL NUTRITION CONSULT NOTE   Indication: prolonged PO intolerance, bowel rest  Patient Measurements: Height: 6' (182.9 cm) Weight: 72.6 kg (160 lb) IBW/kg (Calculated) : 77.6 TPN AdjBW (KG): 72.6 Body mass index is 21.7 kg/m. Usual Weight: 78-82 kg prior to multiple hospital admissions/surgeries  Assessment: 54 yoM with colorectal cancer who underwent neoadjuvant chemo/rads followed by low anterior resection 09/17/63, complicated by breakdown of the rectal anastomosis with multiple associated intra-abdominal abscesses. Readmitted 5/21 and currently undergoing conservative management with drain placement/antibiotics without repair of anastomotic leak. Inconsistent tolerance of diet over past 6 days and Pharmacy now consulted to dose TPN.  Glucose / Insulin: no Hx DM; excellent CBG control (goal 100-150), no SSI required Electrolytes: stable WNL Renal: SCr, bicarb stable wnl; BUN rising but remains WNL; UOP adequate LFTs: alk phos slightly elevated but improving, others enzymes WNL; albumin low but stable; tbili stable WNL Prealbumin / TG: Prealbumin low but increased; TG stable WNL I/O: D5-LR @ KVO; up 2L this admission GI Imaging: - 5/21 CT A/P: anastomotic breakdown at rectal anastomosis w/ extraluminal stool; intra-abdominal abscess, SBO - 5/27 CT A/P: all abscesses reduced in size; concern for ileus or possibly distal SBO Surgeries / Procedures: - 5/5 LAR (see above) - 5/22 CT-guided drain placement x 3 (2 in RLQ, 1 transgluteal): purulent fluid +/- fecal material removed from all drains  Central access: PICC  TPN start date: 5/28   Nutritional Goals  RD recs 5/28: Kcal:  2200-2400, Protein:  105-120g, Fluid:  2.2L/day  TPN at goal rate 46m/hr (2160 ml/day) will provide 2198kcal and 112g protein - changing to cyclic TPN on 54/65 Current Nutrition:  NPO and TPN  Plan:   Continue goal TPN; decrease cycle to 2160 ml given over 16 hr - d/t high calorie  content, glucose infusion rate may be limiting factor in ability to compress cycle (max GIR 6.75 on 12 hr cycle) - will transition slowly with close monitoring of CBGs and LFTs  Electrolytes in TPN: no changes from yesterday: Na 564m/L, K 5528mL, Ca 5mE81m, Mg 5mEq21m Phos 15mmo53m Cl:Ac ratio 1:1  Add standard MVI and trace elements to TPN  Continue sensitive scale SSI with checks while off TPN, during, after start, and after stop   Continue MIVF at KVO  [facility]  Loy Mccartt WReuel BoommD, BCPS [phone], 7:26 AM

## 2019-11-11 NOTE — Progress Notes (Signed)
Postprocedural intraabdominal abscess  Subjective: Buttock pain a little better with the increased flushes.  Continues to have abd distention  Objective: Vital signs in last 24 hours: Temp:  [98.8 F (37.1 C)-99.4 F (37.4 C)] 99.2 F (37.3 C) (06/01 0548) Pulse Rate:  [61-82] 82 (06/01 0548) Resp:  [18] 18 (06/01 0548) BP: (123-129)/(82-89) 129/89 (06/01 0548) SpO2:  [95 %-99 %] 95 % (06/01 0548) Last BM Date: 11/10/19  Intake/Output from previous day: 05/31 0701 - 06/01 0700 In: 2096.1 [P.O.:180; I.V.:1856.1] Out: 2655 [Urine:2550; Drains:105] Intake/Output this shift: Total I/O In: -  Out: 275 [Urine:275]  General appearance: alert and cooperative GI: normal findings: soft, non-distended, appropriately tender Incision/Wound: clean, dry, intact Drains with purulent output  Lab Results:  Results for orders placed or performed during the hospital encounter of 10/31/19 (from the past 24 hour(s))  Glucose, capillary     Status: Abnormal   Collection Time: 11/10/19 11:47 AM  Result Value Ref Range   Glucose-Capillary 108 (H) 70 - 99 mg/dL  Glucose, capillary     Status: Abnormal   Collection Time: 11/10/19  4:10 PM  Result Value Ref Range   Glucose-Capillary 116 (H) 70 - 99 mg/dL  Glucose, capillary     Status: Abnormal   Collection Time: 11/10/19  8:33 PM  Result Value Ref Range   Glucose-Capillary 119 (H) 70 - 99 mg/dL  Comprehensive metabolic panel     Status: Abnormal   Collection Time: 11/11/19  3:40 AM  Result Value Ref Range   Sodium 137 135 - 145 mmol/L   Potassium 4.3 3.5 - 5.1 mmol/L   Chloride 103 98 - 111 mmol/L   CO2 24 22 - 32 mmol/L   Glucose, Bld 130 (H) 70 - 99 mg/dL   BUN 15 6 - 20 mg/dL   Creatinine, Ser 0.68 0.61 - 1.24 mg/dL   Calcium 8.8 (L) 8.9 - 10.3 mg/dL   Total Protein 6.6 6.5 - 8.1 g/dL   Albumin 2.7 (L) 3.5 - 5.0 g/dL   AST 15 15 - 41 U/L   ALT 22 0 - 44 U/L   Alkaline Phosphatase 163 (H) 38 - 126 U/L   Total Bilirubin 0.5 0.3 -  1.2 mg/dL   GFR calc non Af Amer >60 >60 mL/min   GFR calc Af Amer >60 >60 mL/min   Anion gap 10 5 - 15  Magnesium     Status: None   Collection Time: 11/11/19  3:40 AM  Result Value Ref Range   Magnesium 2.0 1.7 - 2.4 mg/dL  Phosphorus     Status: None   Collection Time: 11/11/19  3:40 AM  Result Value Ref Range   Phosphorus 3.4 2.5 - 4.6 mg/dL  Glucose, capillary     Status: Abnormal   Collection Time: 11/11/19  5:43 AM  Result Value Ref Range   Glucose-Capillary 115 (H) 70 - 99 mg/dL     Studies/Results Radiology: CT shows improving fluid collections, possible SBO     MEDS, Scheduled . acetaminophen  500 mg Oral TID WC & HS  . Chlorhexidine Gluconate Cloth  6 each Topical Daily  . enoxaparin (LOVENOX) injection  40 mg Subcutaneous Q24H  . gabapentin  600 mg Oral TID  . insulin aspart  0-9 Units Subcutaneous 4 times per day  . lip balm  1 application Topical BID  . metoprolol tartrate  12.5 mg Oral BID  . sodium chloride flush  5 mL Intracatheter Q8H     Assessment:  Postprocedural intraabdominal abscess Cont IV abx, Drains: per IR  Plan: NPO, ok for sips, chips, gum, hard candy and PO meds Cont PICC and TPN due to intolerance of PO.  Anticipate he will need TPN at home as well.  Will start to cycle Can start to try PO pain meds as tolerated Ambulate in hall ID: completed course of Zosyn and Eraxis Ok to shower if drains are covered Drain site pain: cont more frequent flushing, appreciate any help by IR team with this as pain control is mostly what's keeping him here at this point  LOS: 10 days    Rosario Adie, MD [facility], [place]    11/11/2019 8:31 AM

## 2019-11-12 ENCOUNTER — Encounter: Payer: Commercial Managed Care - PPO | Admitting: Physical Therapy

## 2019-11-12 LAB — GLUCOSE, CAPILLARY
Glucose-Capillary: 114 mg/dL — ABNORMAL HIGH (ref 70–99)
Glucose-Capillary: 133 mg/dL — ABNORMAL HIGH (ref 70–99)
Glucose-Capillary: 106 mg/dL — ABNORMAL HIGH (ref 70–99)
Glucose-Capillary: 109 mg/dL — ABNORMAL HIGH (ref 70–99)
Glucose-Capillary: 139 mg/dL — ABNORMAL HIGH (ref 70–99)

## 2019-11-12 MED ORDER — INSULIN ASPART 100 UNIT/ML ~~LOC~~ SOLN
0.0000 [IU] | SUBCUTANEOUS | Status: DC
  Administered 2019-11-12 – 2019-11-13 (×2): 1 [IU] via SUBCUTANEOUS

## 2019-11-12 MED ORDER — HYDROMORPHONE 1 MG/ML IV SOLN
INTRAVENOUS | Status: DC
  Administered 2019-11-12: 30 mg via INTRAVENOUS
  Administered 2019-11-12: 4.4 mg via INTRAVENOUS
  Administered 2019-11-13: 2.7 mg via INTRAVENOUS
  Administered 2019-11-13: 3.9 mg via INTRAVENOUS
  Administered 2019-11-13: 7.5 mg via INTRAVENOUS
  Administered 2019-11-13: 4.5 mg via INTRAVENOUS
  Administered 2019-11-13: 30 mg via INTRAVENOUS
  Administered 2019-11-14: 3.9 mg via INTRAVENOUS
  Administered 2019-11-14: 1.8 mg via INTRAVENOUS
  Administered 2019-11-14: 3.3 mg via INTRAVENOUS
  Administered 2019-11-14: 30 mg via INTRAVENOUS
  Administered 2019-11-14: 5.7 mg via INTRAVENOUS
  Administered 2019-11-16 (×3): 0.3 mg via INTRAVENOUS
  Administered 2019-11-16: 30 mg via INTRAVENOUS
  Administered 2019-11-17: 0.9 mg via INTRAVENOUS
  Administered 2019-11-17: 5.1 mg via INTRAVENOUS
  Administered 2019-11-17: 3 mg via INTRAVENOUS
  Filled 2019-11-12 (×4): qty 30

## 2019-11-12 MED ORDER — SODIUM CHLORIDE 0.9% FLUSH: 9.0000 mL | INTRAVENOUS | Status: DC | PRN

## 2019-11-12 MED ORDER — LIDOCAINE 5 % EX PTCH
1.0000 | MEDICATED_PATCH | CUTANEOUS | Status: DC
  Administered 2019-11-12 – 2019-11-13 (×2): 1 via TRANSDERMAL
  Filled 2019-11-12 (×3): qty 1

## 2019-11-12 MED ORDER — NALOXONE HCL 0.4 MG/ML IJ SOLN: 0.4000 mg | INTRAMUSCULAR | Status: DC | PRN

## 2019-11-12 MED ORDER — HYDROMORPHONE HCL 1 MG/ML IJ SOLN
1.0000 mg | Freq: Once | INTRAMUSCULAR | Status: AC
  Administered 2019-11-16: 1 mg via INTRAVENOUS
  Filled 2019-11-12: qty 1

## 2019-11-12 MED ORDER — TRAVASOL 10 % IV SOLN
INTRAVENOUS | Status: AC
  Filled 2019-11-12: qty 1188

## 2019-11-12 NOTE — Progress Notes (Signed)
PHARMACY - TOTAL PARENTERAL NUTRITION CONSULT NOTE   Indication: prolonged PO intolerance, bowel rest  Patient Measurements: Height: 6' (182.9 cm) Weight: 72.6 kg (160 lb) IBW/kg (Calculated) : 77.6 TPN AdjBW (KG): 72.6 Body mass index is 21.7 kg/m. Usual Weight: 78-82 kg prior to multiple hospital admissions/surgeries  Assessment: 58 yoM with colorectal cancer who underwent neoadjuvant chemo/rads followed by low anterior resection 10/16/23, complicated by breakdown of the rectal anastomosis with multiple associated intra-abdominal abscesses. Readmitted 5/21 and currently undergoing conservative management with drain placement/antibiotics without repair of anastomotic leak. Inconsistent tolerance of diet over past 6 days and Pharmacy now consulted to dose TPN.  Glucose / Insulin: no Hx DM; excellent CBG control (goal 100-150), no SSI required - SSI was adjusted for 16-hr cycle too early yesterday, resulting in no CBG check 1 hr after TPN stop; next CBG check was 5 hrs later and was WNL Electrolytes: stable WNL (6/1) Renal: SCr, bicarb stable wnl; BUN rising but remains WNL (6/1); UOP excellent Hepatic: alk phos slightly elevated but improving, other enzymes WNL; albumin low but stable; tbili stable WNL (6/1) Prealbumin / TG: Prealbumin low but increased; TG stable WNL (5/31) I/O: D5-LR @ KVO; up 2L this admission GI Imaging: - 5/21 CT A/P: anastomotic breakdown at rectal anastomosis w/ extraluminal stool; intra-abdominal abscess, SBO - 5/27 CT A/P: all abscesses reduced in size; concern for ileus or possibly distal SBO Surgeries / Procedures: - 5/5 LAR (see above) - 5/22 CT-guided drain placement x 3 (2 in RLQ, 1 transgluteal): purulent fluid +/- fecal material removed from all drains - RLQ drain removed 5/28 - 6/1 transgluteal drain exchanged d/t cellulitis at drain site & clogging d/t fecal material  Central access: PICC  TPN start date: 5/28   Nutritional Goals  RD recs  5/28: Kcal:  2200-2400, Protein:  105-120g, Fluid:  2.2L/day  TPN at goal rate 82m/hr (2160 ml/day) will provide 2150 kcal and 119 g protein - changing to cyclic TPN on 56/38 Current Nutrition:  NPO and TPN  Plan:   Continue goal TPN; decrease cycle to 2160 ml given over 14 hr (max glucose infusion rate 5.34 after maxing out protein and reducing dextrose slightly)  Electrolytes in TPN: no changes from yesterday: Na 575m/L, K 5571mL, Ca 5mE57m, Mg 5mEq80m Phos 15mmo47m Cl:Ac ratio 1:1  Add standard MVI and trace elements to TPN  Continue sensitive scale SSI with checks while off TPN, during, after start, and after stop   Continue MIVF at KVO  [facility]  Jacqueli Pangallo WReuel BoommD, BCPS [phone], 7:49 AM

## 2019-11-12 NOTE — Progress Notes (Signed)
Referring Physician(s): Dr. [name]  Supervising Physician: Daryll Brod  Patient Status:  [facility] - In-pt  Chief Complaint:  Abdominal/pelvic pain/abscesses  Subjective: Patient is sleepy, states he is using his dilaudid PCA frequently for the pain in his tailbone area. States the abdominal discomfort is mild-moderate.   Allergies: Patient has no known allergies.  Medications: Prior to Admission medications   Medication Sig Start Date End Date Taking? Authorizing Provider  acetaminophen (TYLENOL) 500 MG tablet Take 500 mg by mouth every 6 (six) hours as needed for moderate pain.   Yes [provider]  gabapentin (NEURONTIN) 300 MG capsule Take 2 capsules (600 mg total) by mouth 2 (two) times daily. Patient taking differently: Take 600 mg by mouth 2 (two) times daily as needed (pain).  09/04/19  Yes Owens Shark, NP  ibuprofen (ADVIL) 200 MG tablet Take 400 mg by mouth every 6 (six) hours as needed for moderate pain.   Yes [provider]  LORazepam (ATIVAN) 0.5 MG tablet Take 1 tablet (0.5 mg total) by mouth every 8 (eight) hours as needed for anxiety. Or nausea. Do not drive while taking [id number]  Yes Owens Shark, NP  oxyCODONE (OXY IR/ROXICODONE) 5 MG immediate release tablet Take 1-2 tablets (5-10 mg total) by mouth every 6 (six) hours as needed for moderate pain, severe pain or breakthrough pain. [id number]  Yes Leighton Ruff, MD  lidocaine-prilocaine (EMLA) cream Apply 1 application topically as directed. Apply 1 hour prior to stick and cover with plastic wrap Patient not taking: Reported on 09/25/2019 07/10/19   Ladell Pier, MD     Vital Signs: BP 123/79 (BP Location: Right Arm)   Pulse 61   Temp 98.8 F (37.1 C) (Oral)   Resp 16   Ht 6' (1.829 m)   Wt 160 lb (72.6 kg)   SpO2 99%   BMI 21.70 kg/m   Physical Exam Constitutional:      Appearance: He is ill-appearing.     Comments: Sleepy but answers all questions appropriately.   Pulmonary:      Effort: Pulmonary effort is normal.  Abdominal:     General: Abdomen is flat.     Tenderness: There is abdominal tenderness.     Comments: Right groin tenderness. Abdomen feels firm. Drain/bulb in place. Dressing is clean and dry. The skin at the insertion site is clean and dry without drainage or erythema. Approximately 10 cc fluid in the bulb. 0 cc documented on flow sheet.  Right transgluteal drain in place with gravity bag. Scant amount of feculant-looking material inside. 22.5 cc output documented on flow sheet.   Skin:    General: Skin is warm and dry.     Imaging: IR Catheter Tube Change  Result Date: 11/12/2019 INDICATION: 44 year old male with a history of rectal adenocarcinoma status post robotic assisted low anterior resection on [id number] complicated by anastomotic dehiscence and intra-abdominal abscess formation. He currently has a right transgluteal perirectal drainage catheter which is causing him increasing pain. On CT imaging, the catheter appears to be pulled back and is just deep to the piriformis muscle. He presents today for catheter exchange and repositioning. EXAM: Abscess drain exchange MEDICATIONS: The patient is currently admitted to the hospital and receiving intravenous antibiotics. The antibiotics were administered within an appropriate time frame prior to the initiation of the procedure. ANESTHESIA/SEDATION: 50 mcg fentanyl administered for pain control. This does not constitute moderate sedation. COMPLICATIONS: None immediate. PROCEDURE: Informed written consent was obtained from the  patient after a thorough discussion of the procedural risks, benefits and alternatives. All questions were addressed. Maximal Sterile Barrier Technique was utilized including caps, mask, sterile gowns, sterile gloves, sterile drape, hand hygiene and skin antiseptic. A timeout was performed prior to the initiation of the procedure. A gentle hand injection of contrast material was performed  through the existing catheter. The catheter is at the posterior most aspect of the abscess cavity. The abscess cavity extends more anteriorly toward the mid pelvis. There is frank filling of the distal most rectum/anus. The retention suture was cut. The catheter was transected and removed over a Bentson wire. A 5 French angled catheter was advanced over the wire and used to manipulate the wire into the more anterior component of the contained perforation. A new Cook 12 [place] all-purpose drainage catheter was then advanced over the wire and formed. There is return of frankly feculent material. The catheter was gently flushed with saline and connected to bag drainage. The catheter was secured to the skin with an adhesive fixation device. IMPRESSION: 1. Obvious cellulitis of the tissue surrounding the drain exit site. This is likely due to fecal bacteria tracking back along the tube to the skin surface. 2. Successful exchange and repositioning of drainage catheter deeper within the contained anastomotic perforation. 3. Drainage material is frankly feculent and highly likely the result in clogging of this drainage catheter. Consideration could be given to progressive up sizing as drains currently on back order become available. PLAN: 1. Flush drainage catheter at least once per shift. 2. Maintain drain to gravity bag. 3. Consider continued antibiotics for cellulitis. Electronically Signed   By: Jacqulynn Cadet M.D.   On: 11/12/2019 08:37    Labs:  CBC: Recent Labs    11/02/19 0430 11/05/19 0436 11/08/19 0434 11/10/19 0300  WBC 12.0* 11.6* 9.0 7.0  HGB 9.9* 9.5* 9.2* 9.9*  HCT 30.6* 29.1* 28.9* 31.1*  PLT 569* 470* 480* 492*    COAGS: Recent Labs    11/01/19 0731  INR 1.2    BMP: Recent Labs    11/08/19 0434 11/09/19 0300 11/10/19 0300 11/11/19 0340  NA 137 140 140 137  K 3.8 3.9 4.6 4.3  CL 102 106 105 103  CO2 26 26 25 24   GLUCOSE 124* 117* 116* 130*  BUN 7 9 12 15   CALCIUM 8.7*  8.7* 8.9 8.8*  CREATININE 0.77 0.80 0.82 0.68  GFRNONAA >60 >60 >60 >60  GFRAA >60 >60 >60 >60    LIVER FUNCTION TESTS: Recent Labs    11/08/19 0434 11/09/19 0300 11/10/19 0300 11/11/19 0340  BILITOT 0.6 0.7 0.5 0.5  AST 18 17 16 15   ALT 27 23 24 22   ALKPHOS 191* 181* 188* 163*  PROT 6.4* 6.4* 6.7 6.6  ALBUMIN 2.6* 2.6* 2.7* 2.7*    Assessment and Plan:  44 y/o M with history of colorectal cancer s/p robotic assisted low anterior resection 10/15/19 with subsequent development of multiple intra-abdominal abscesses. IR placed abdominal drains x 3 on 5/22; afebrile CT reviewed with Dr. Earleen Newport. Interval improvement in all 3 abscesses.  RLQ abscess essentially resolved. Given scant output, this drain was removed at bedside.   Patient currently has a lower mid-abdominal drain and a right transgluteal drain which was exchanged yesterday by Dr. Laurence Ferrari. Per Dr. Katrinka Blazing procedure note from 11/11/2019: - Cellulitis at drain insertion site likely due to fecal bacteria - Drain subject to clogging due to frank fecal material. May need further upsizing in the future.  Continue current drain orders: flush drains TID, change dressing daily and/or PRN, record input/output.   IR will continue to follow.   Electronically Signed: Theresa Duty, NP 11/12/2019, 11:56 AM   I spent a total of 15 Minutes at the the patient's bedside AND on the patient's hospital floor or unit, greater than 50% of which was counseling/coordinating care for abdominal and transgluteal drains.

## 2019-11-12 NOTE — Progress Notes (Signed)
Postprocedural intraabdominal abscess  Subjective: Had a lot of pain yesterday.  Transgluteal drain exchanged.  Objective: Vital signs in last 24 hours: Temp:  [98.8 F (37.1 C)-99.3 F (37.4 C)] 98.8 F (37.1 C) (06/02 0605) Pulse Rate:  [61-77] 61 (06/02 0605) Resp:  [17-18] 17 (06/02 0605) BP: (117-124)/(79-80) 123/79 (06/02 0605) SpO2:  [97 %-99 %] 99 % (06/02 0605) Last BM Date: 11/10/19  Intake/Output from previous day: 06/01 0701 - 06/02 0700 In: 2280.8 [P.O.:90; I.V.:2170.8] Out: 2197.5 [Urine:2175; Drains:22.5] Intake/Output this shift: No intake/output data recorded.  General appearance: alert and cooperative GI: normal findings: soft, non-distended, appropriately tender Incision/Wound: clean, dry, intact Drains with purulent output TG drain with stool  Lab Results:  Results for orders placed or performed during the hospital encounter of 10/31/19 (from the past 24 hour(s))  Glucose, capillary     Status: Abnormal   Collection Time: 11/11/19 11:51 AM  Result Value Ref Range   Glucose-Capillary 114 (H) 70 - 99 mg/dL  Glucose, capillary     Status: Abnormal   Collection Time: 11/11/19  6:06 PM  Result Value Ref Range   Glucose-Capillary 103 (H) 70 - 99 mg/dL  Glucose, capillary     Status: Abnormal   Collection Time: 11/11/19  8:39 PM  Result Value Ref Range   Glucose-Capillary 105 (H) 70 - 99 mg/dL  Glucose, capillary     Status: Abnormal   Collection Time: 11/12/19  3:54 AM  Result Value Ref Range   Glucose-Capillary 114 (H) 70 - 99 mg/dL  Glucose, capillary     Status: Abnormal   Collection Time: 11/12/19  7:36 AM  Result Value Ref Range   Glucose-Capillary 133 (H) 70 - 99 mg/dL   Comment 1 Notify RN    Comment 2 Document in Chart      Studies/Results Radiology: CT shows improving fluid collections, possible SBO     MEDS, Scheduled . acetaminophen  500 mg Oral TID WC & HS  . Chlorhexidine Gluconate Cloth  6 each Topical Daily  . enoxaparin  (LOVENOX) injection  40 mg Subcutaneous Q24H  . gabapentin  600 mg Oral TID  . insulin aspart  0-9 Units Subcutaneous 4 times per day  . lidocaine  1 patch Transdermal Q24H  . lip balm  1 application Topical BID  . metoprolol tartrate  12.5 mg Oral BID  . sodium chloride flush  5 mL Intracatheter Q8H     Assessment: Postprocedural intraabdominal abscess Drains: per IR  Plan: NPO, ok for sips, chips, gum, hard candy and PO meds Cont PICC and TPN due to intolerance of PO.  Anticipate he will need TPN at home as well.  Currently cycling at 14h Can try PO pain meds as tolerated Will swtich to PCA and Lidocaine patch for better pain control Ambulate in hall ID: completed course of Zosyn and Eraxis.  Wbc wnl Ok to shower if drains are covered  Drain site pain: cont more frequent flushing, appreciate any help by IR team with this as pain control is mostly what's keeping him here at this point Will most likely need OR fecal diversion once abd inflammation better (1-2 months)   LOS: 11 days    Rosario Adie, MD [facility], [place]    11/12/2019 9:04 AM

## 2019-11-13 LAB — COMPREHENSIVE METABOLIC PANEL
ALT: 20 U/L (ref 0–44)
AST: 18 U/L (ref 15–41)
Albumin: 2.9 g/dL — ABNORMAL LOW (ref 3.5–5.0)
Alkaline Phosphatase: 155 U/L — ABNORMAL HIGH (ref 38–126)
Anion gap: 9 (ref 5–15)
BUN: 19 mg/dL (ref 6–20)
CO2: 25 mmol/L (ref 22–32)
Calcium: 9 mg/dL (ref 8.9–10.3)
Chloride: 103 mmol/L (ref 98–111)
Creatinine, Ser: 0.6 mg/dL — ABNORMAL LOW (ref 0.61–1.24)
GFR calc Af Amer: >60 mL/min (ref >60)
GFR calc non Af Amer: >60 mL/min (ref >60)
Glucose, Bld: 121 mg/dL — ABNORMAL HIGH (ref 70–99)
Potassium: 4.4 mmol/L (ref 3.5–5.1)
Sodium: 137 mmol/L (ref 135–145)
Total Bilirubin: 0.5 mg/dL (ref 0.3–1.2)
Total Protein: 6.8 g/dL (ref 6.5–8.1)

## 2019-11-13 LAB — MAGNESIUM: Magnesium: 2.1 mg/dL (ref 1.7–2.4)

## 2019-11-13 LAB — GLUCOSE, CAPILLARY
Glucose-Capillary: 127 mg/dL — ABNORMAL HIGH (ref 70–99)
Glucose-Capillary: 93 mg/dL (ref 70–99)
Glucose-Capillary: 110 mg/dL — ABNORMAL HIGH (ref 70–99)
Glucose-Capillary: 89 mg/dL (ref 70–99)
Glucose-Capillary: 123 mg/dL — ABNORMAL HIGH (ref 70–99)

## 2019-11-13 LAB — PHOSPHORUS: Phosphorus: 3.7 mg/dL (ref 2.5–4.6)

## 2019-11-13 MED ORDER — TRAVASOL 10 % IV SOLN
INTRAVENOUS | Status: AC
  Filled 2019-11-13: qty 1188

## 2019-11-13 MED ORDER — INSULIN ASPART 100 UNIT/ML ~~LOC~~ SOLN
0.0000 [IU] | SUBCUTANEOUS | Status: DC
  Administered 2019-11-13: 1 [IU] via SUBCUTANEOUS

## 2019-11-13 NOTE — Progress Notes (Signed)
PHARMACY - TOTAL PARENTERAL NUTRITION CONSULT NOTE   Indication: prolonged PO intolerance, bowel rest  Patient Measurements: Height: 6' (182.9 cm) Weight: 72.6 kg (160 lb) IBW/kg (Calculated) : 77.6 TPN AdjBW (KG): 72.6 Body mass index is 21.7 kg/m. Usual Weight: 78-82 kg prior to multiple hospital admissions/surgeries  Assessment: 5 yoM with colorectal cancer who underwent neoadjuvant chemo/rads followed by low anterior resection 02/18/22, complicated by breakdown of the rectal anastomosis with multiple associated intra-abdominal abscesses. Readmitted 5/21 and currently undergoing conservative management with drain placement/antibiotics without repair of anastomotic leak. Inconsistent tolerance of diet over past 6 days and Pharmacy now consulted to dose TPN.  Glucose / Insulin: no Hx DM; excellent CBG control even with cycling (goal 100-150, range 105-139); minimal SSI required Electrolytes: stable WNL Renal: SCr, bicarb stable wnl; BUN rising but remains WNL (6/1); UOP borderline low Hepatic: alk phos slightly elevated but continues to improve; Tbili, other enzymes stable WNL; albumin low but slightly improved (6/3) Prealbumin / TG: Prealbumin low but increased; TG stable WNL (5/31) I/O: D5-LR @ KVO; up 2L this admission GI Imaging: - 5/21 CT A/P: anastomotic breakdown at rectal anastomosis w/ extraluminal stool; intra-abdominal abscess, SBO - 5/27 CT A/P: all abscesses reduced in size; concern for ileus or possibly distal SBO Surgeries / Procedures: - 5/5 LAR (see above) - 5/22 CT-guided drain placement x 3 (2 in RLQ, 1 transgluteal): purulent fluid +/- fecal material removed from all drains - RLQ drain removed 5/28 - 6/1 transgluteal drain exchanged d/t cellulitis at drain site & clogging d/t fecal material  Central access: PICC  TPN start date: 5/28   Nutritional Goals  RD recs 5/28: Kcal:  2200-2400, Protein:  105-120g, Fluid:  2.2L/day  TPN at goal rate 35m/hr (2160  ml/day) will provide 2150 kcal and 119 g protein - changing to cyclic TPN on 54/14 Current Nutrition:  NPO and TPN  Plan:   Continue goal TPN; decrease cycle to 2160 ml given over 12 hr (max glucose infusion rate 6.3 after maxing out protein and reducing dextrose slightly)  Electrolytes in TPN: no changes from yesterday: Na 523m/L, K 5560mL, Ca 5mE27m, Mg 5mEq63m Phos 15mmo47m Cl:Ac ratio 1:1  Add standard MVI and trace elements to TPN  Continue sensitive scale SSI with checks while off TPN, during, after start, and after stop   Continue MIVF at KVO  [facility] on Mon/Thurs  Drew WReuel BoommD, BCPS [phone], 7:12 AM

## 2019-11-13 NOTE — Progress Notes (Signed)
Postprocedural intraabdominal abscess  Subjective: Pain better with pca.    Objective: Vital signs in last 24 hours: Temp:  [98.5 F (36.9 C)-98.9 F (37.2 C)] 98.7 F (37.1 C) (06/03 0556) Pulse Rate:  [63-74] 63 (06/03 0556) Resp:  [14-20] 20 (06/03 0556) BP: (121-125)/(75-88) 121/75 (06/03 0556) SpO2:  [96 %-100 %] 97 % (06/03 0556) Last BM Date: 11/12/19  Intake/Output from previous day: 06/02 0701 - 06/03 0700 In: 2496 [P.O.:270; I.V.:2186] Out: 880 [Urine:870; Drains:10] Intake/Output this shift: No intake/output data recorded.  General appearance: alert and cooperative GI: normal findings: soft, non-distended, appropriately tender Incision/Wound: clean, dry, intact Drains with purulent output TG drain with stool  Lab Results:  Results for orders placed or performed during the hospital encounter of 10/31/19 (from the past 24 hour(s))  Glucose, capillary     Status: Abnormal   Collection Time: 11/12/19 10:49 AM  Result Value Ref Range   Glucose-Capillary 106 (H) 70 - 99 mg/dL   Comment 1 Notify RN    Comment 2 Document in Chart   Glucose, capillary     Status: Abnormal   Collection Time: 11/12/19  4:15 PM  Result Value Ref Range   Glucose-Capillary 109 (H) 70 - 99 mg/dL  Glucose, capillary     Status: Abnormal   Collection Time: 11/12/19  8:00 PM  Result Value Ref Range   Glucose-Capillary 139 (H) 70 - 99 mg/dL  Comprehensive metabolic panel     Status: Abnormal   Collection Time: 11/13/19  3:27 AM  Result Value Ref Range   Sodium 137 135 - 145 mmol/L   Potassium 4.4 3.5 - 5.1 mmol/L   Chloride 103 98 - 111 mmol/L   CO2 25 22 - 32 mmol/L   Glucose, Bld 121 (H) 70 - 99 mg/dL   BUN 19 6 - 20 mg/dL   Creatinine, Ser 0.60 (L) 0.61 - 1.24 mg/dL   Calcium 9.0 8.9 - 10.3 mg/dL   Total Protein 6.8 6.5 - 8.1 g/dL   Albumin 2.9 (L) 3.5 - 5.0 g/dL   AST 18 15 - 41 U/L   ALT 20 0 - 44 U/L   Alkaline Phosphatase 155 (H) 38 - 126 U/L   Total Bilirubin 0.5 0.3 -  1.2 mg/dL   GFR calc non Af Amer >60 >60 mL/min   GFR calc Af Amer >60 >60 mL/min   Anion gap 9 5 - 15  Magnesium     Status: None   Collection Time: 11/13/19  3:27 AM  Result Value Ref Range   Magnesium 2.1 1.7 - 2.4 mg/dL  Phosphorus     Status: None   Collection Time: 11/13/19  3:27 AM  Result Value Ref Range   Phosphorus 3.7 2.5 - 4.6 mg/dL  Glucose, capillary     Status: Abnormal   Collection Time: 11/13/19  5:53 AM  Result Value Ref Range   Glucose-Capillary 127 (H) 70 - 99 mg/dL  Glucose, capillary     Status: None   Collection Time: 11/13/19  7:32 AM  Result Value Ref Range   Glucose-Capillary 93 70 - 99 mg/dL     Studies/Results Radiology: CT shows improving fluid collections, possible SBO     MEDS, Scheduled . acetaminophen  500 mg Oral TID WC & HS  . Chlorhexidine Gluconate Cloth  6 each Topical Daily  . enoxaparin (LOVENOX) injection  40 mg Subcutaneous Q24H  . gabapentin  600 mg Oral TID  . HYDROmorphone   Intravenous Q4H  .  HYDROmorphone (DILAUDID) injection  1 mg Intravenous Once  . insulin aspart  0-9 Units Subcutaneous 4 times per day  . lidocaine  1 patch Transdermal Q24H  . lip balm  1 application Topical BID  . metoprolol tartrate  12.5 mg Oral BID  . sodium chloride flush  5 mL Intracatheter Q8H     Assessment: Postprocedural intraabdominal abscess Drains: per IR  Plan: NPO, ok for sips, chips, gum, hard candy and PO meds Cont PICC and TPN due to intolerance of PO.  Anticipate he will need TPN at home as well.  Currently cycling  Can try PO pain meds as tolerated Will cont PCA and Lidocaine patch for better pain control Ambulate in hall ID: completed course of Zosyn and Eraxis.  Wbc wnl Ok to shower if drains are covered  Drain site pain: cont more frequent flushing, appreciate any help by IR team with this as pain control is mostly what's keeping him here at this point Will most likely need OR fecal diversion once abd inflammation better  (1-2 months)   LOS: 12 days    Rosario Adie, MD [facility], [place]    11/13/2019 7:36 AM

## 2019-11-13 NOTE — Progress Notes (Signed)
Referring Physician(s): Gross, Remo Lipps ([place])  Supervising Physician: Markus Daft  Patient Status:  [facility] - In-pt  Chief Complaint: Pain at drain  Subjective:  History of CRC s/p robotic assisted lower anterior resection in OR 10/15/2019 by Dr. [name]; with subsequent development of multiple intra-abdominal/pelvic fluid collections s/p RLQ drain placements x 2 along with right TG drain placement in IR 11/01/2019 by Dr. Anselm Pancoast; s/p removal of 1 of the RLQ drains 11/07/2019; s/p exchange of right TG drain 11/11/2019 in IR by Dr. Laurence Ferrari (patient currently has 2 IR drains- 1 RLQ and 1 right TG). Patient awake and alert laying in bed. Complains of tenderness of TG drain, as expected. RLQ and right TG drain sites c/d/i.   Allergies: Patient has no known allergies.  Medications: Prior to Admission medications   Medication Sig Start Date End Date Taking? Authorizing Provider  acetaminophen (TYLENOL) 500 MG tablet Take 500 mg by mouth every 6 (six) hours as needed for moderate pain.   Yes [provider]  gabapentin (NEURONTIN) 300 MG capsule Take 2 capsules (600 mg total) by mouth 2 (two) times daily. Patient taking differently: Take 600 mg by mouth 2 (two) times daily as needed (pain).  09/04/19  Yes Owens Shark, NP  ibuprofen (ADVIL) 200 MG tablet Take 400 mg by mouth every 6 (six) hours as needed for moderate pain.   Yes [provider]  LORazepam (ATIVAN) 0.5 MG tablet Take 1 tablet (0.5 mg total) by mouth every 8 (eight) hours as needed for anxiety. Or nausea. Do not drive while taking [id number]  Yes Owens Shark, NP  oxyCODONE (OXY IR/ROXICODONE) 5 MG immediate release tablet Take 1-2 tablets (5-10 mg total) by mouth every 6 (six) hours as needed for moderate pain, severe pain or breakthrough pain. [id number]  Yes Leighton Ruff, MD  lidocaine-prilocaine (EMLA) cream Apply 1 application topically as directed. Apply 1 hour prior to stick and cover with plastic wrap Patient  not taking: Reported on 09/25/2019 07/10/19   Ladell Pier, MD     Vital Signs: BP 121/77 (BP Location: Left Arm)   Pulse 73   Temp 98.2 F (36.8 C)   Resp 16   Ht 6' (1.829 m)   Wt 160 lb (72.6 kg)   SpO2 100%   BMI 21.70 kg/m   Physical Exam Vitals and nursing note reviewed.  Constitutional:      General: He is not in acute distress.    Appearance: Normal appearance.  Pulmonary:     Effort: Pulmonary effort is normal. No respiratory distress.  Abdominal:     Comments: RLQ drain site without tenderness, erythema, drainage, or active bleeding; approximately 20 cc clear yellow fluid in suction bulb. Right TG drain site with mild tenderness, no erythema, drainage, or active bleeding; approximately 25 cc feculent output in gravity bag.  Skin:    General: Skin is warm and dry.  Neurological:     Mental Status: He is alert and oriented to person, place, and time.     Imaging: IR Catheter Tube Change  Result Date: 11/12/2019 INDICATION: 44 year old male with a history of rectal adenocarcinoma status post robotic assisted low anterior resection on [id number] complicated by anastomotic dehiscence and intra-abdominal abscess formation. He currently has a right transgluteal perirectal drainage catheter which is causing him increasing pain. On CT imaging, the catheter appears to be pulled back and is just deep to the piriformis muscle. He presents today for catheter exchange and repositioning. EXAM: Abscess  drain exchange MEDICATIONS: The patient is currently admitted to the hospital and receiving intravenous antibiotics. The antibiotics were administered within an appropriate time frame prior to the initiation of the procedure. ANESTHESIA/SEDATION: 50 mcg fentanyl administered for pain control. This does not constitute moderate sedation. COMPLICATIONS: None immediate. PROCEDURE: Informed written consent was obtained from the patient after a thorough discussion of the procedural risks,  benefits and alternatives. All questions were addressed. Maximal Sterile Barrier Technique was utilized including caps, mask, sterile gowns, sterile gloves, sterile drape, hand hygiene and skin antiseptic. A timeout was performed prior to the initiation of the procedure. A gentle hand injection of contrast material was performed through the existing catheter. The catheter is at the posterior most aspect of the abscess cavity. The abscess cavity extends more anteriorly toward the mid pelvis. There is frank filling of the distal most rectum/anus. The retention suture was cut. The catheter was transected and removed over a Bentson wire. A 5 French angled catheter was advanced over the wire and used to manipulate the wire into the more anterior component of the contained perforation. A new Cook 12 [place] all-purpose drainage catheter was then advanced over the wire and formed. There is return of frankly feculent material. The catheter was gently flushed with saline and connected to bag drainage. The catheter was secured to the skin with an adhesive fixation device. IMPRESSION: 1. Obvious cellulitis of the tissue surrounding the drain exit site. This is likely due to fecal bacteria tracking back along the tube to the skin surface. 2. Successful exchange and repositioning of drainage catheter deeper within the contained anastomotic perforation. 3. Drainage material is frankly feculent and highly likely the result in clogging of this drainage catheter. Consideration could be given to progressive up sizing as drains currently on back order become available. PLAN: 1. Flush drainage catheter at least once per shift. 2. Maintain drain to gravity bag. 3. Consider continued antibiotics for cellulitis. Electronically Signed   By: Jacqulynn Cadet M.D.   On: 11/12/2019 08:37    Labs:  CBC: Recent Labs    11/02/19 0430 11/05/19 0436 11/08/19 0434 11/10/19 0300  WBC 12.0* 11.6* 9.0 7.0  HGB 9.9* 9.5* 9.2* 9.9*  HCT  30.6* 29.1* 28.9* 31.1*  PLT 569* 470* 480* 492*    COAGS: Recent Labs    11/01/19 0731  INR 1.2    BMP: Recent Labs    11/09/19 0300 11/10/19 0300 11/11/19 0340 11/13/19 0327  NA 140 140 137 137  K 3.9 4.6 4.3 4.4  CL 106 105 103 103  CO2 26 25 24 25   GLUCOSE 117* 116* 130* 121*  BUN 9 12 15 19   CALCIUM 8.7* 8.9 8.8* 9.0  CREATININE 0.80 0.82 0.68 0.60*  GFRNONAA >60 >60 >60 >60  GFRAA >60 >60 >60 >60    LIVER FUNCTION TESTS: Recent Labs    11/09/19 0300 11/10/19 0300 11/11/19 0340 11/13/19 0327  BILITOT 0.7 0.5 0.5 0.5  AST 17 16 15 18   ALT 23 24 22 20   ALKPHOS 181* 188* 163* 155*  PROT 6.4* 6.7 6.6 6.8  ALBUMIN 2.6* 2.7* 2.7* 2.9*    Assessment and Plan:  History of CRC s/p robotic assisted lower anterior resection in OR 10/15/2019 by Dr. [name]; with subsequent development of multiple intra-abdominal/pelvic fluid collections s/p RLQ drain placements x 2 along with right TG drain placement in IR 11/01/2019 by Dr. Anselm Pancoast; s/p removal of 1 of the RLQ drains 11/07/2019; s/p exchange of right TG drain 11/11/2019  in IR by Dr. Laurence Ferrari (patient currently has 2 IR drains- 1 RLQ and 1 right TG). RLQ drain stable with approximately 20 cc clear yellow fluid in suction bulb (additional 10 cc output from drain in past 24 hours per chart). Right TG drain stable with approximately 25 cc feculent output in gravity bag. Continue current drain management- continue with Qshift flushes/monitor of output. Plan for repeat CT/possible drain injection when output <10 cc/day (assess for possible removal). Further plans per [facility]- appreciate and agree with management. IR to follow.   Electronically Signed: Earley Abide, PA-C 11/13/2019, 3:26 PM   I spent a total of 25 Minutes at the the patient's bedside AND on the patient's hospital floor or unit, greater than 50% of which was counseling/coordinating care for intraabdominal abscesses x3 s/p drain placements x3.

## 2019-11-13 NOTE — Progress Notes (Signed)
TPN stopped

## 2019-11-13 NOTE — Progress Notes (Signed)
Nutrition Follow-up  INTERVENTION:   -Cyclic TPN per Pharmacy -Will monitor for plan of care  NUTRITION DIAGNOSIS:   Increased nutrient needs related to chronic illness, cancer and cancer related treatments as evidenced by estimated needs.  Ongoing.  GOAL:   Patient will meet greater than or equal to 90% of their needs  Meeting with TPN.  MONITOR:   Labs, Weight trends, I & O's, Skin(TPN)  ASSESSMENT:   44 year old male who underwent robotic low anterior resection on May 5, [id number] by Dr. Leighton Ruff for an adenocarcinoma of the rectum.  Patient was discharged home on Oct 21, 2019.  Since discharge the patient has had persistent lower abdominal pain.  5/22: s/p CT guided drainage of percutaneous catheter 5/23: pt on clear liquid diet 5/24: diet was advanced to fulls 5/28: NPO, TPN initiation  Patient now on cyclic TPN (providing [id number] kcals and 118g protein). Diet has remained NPO given intolerance to PO. Per surgery note, pt to discharge on home TPN.  Admission weight: 160 lbs. No new weights for this admission.  I/Os: +3.5L since admit UOP: 870 ml x 24 hrs  Medications reviewed. Labs reviewed:  CBGs: 93-127  Diet Order:   Diet Order            Diet NPO time specified Except for: Ice Chips  Diet effective now              EDUCATION NEEDS:   No education needs have been identified at this time  Skin:  Skin Assessment: Skin Integrity Issues: Skin Integrity Issues:: Incisions Incisions: abdominal  Last BM:  6/2 -type 7  Height:   Ht Readings from Last 1 Encounters:  10/31/19 6' (1.829 m)    Weight:   Wt Readings from Last 1 Encounters:  10/31/19 72.6 kg    Ideal Body Weight:     BMI:  Body mass index is 21.7 kg/m.  Estimated Nutritional Needs:   Kcal:  2200-2400  Protein:  105-120g  Fluid:  2.2L/day  Clayton Bibles, MS, RD, LDN Inpatient Clinical Dietitian Contact information available via Amion

## 2019-11-14 LAB — GLUCOSE, CAPILLARY
Glucose-Capillary: 125 mg/dL — ABNORMAL HIGH (ref 70–99)
Glucose-Capillary: 114 mg/dL — ABNORMAL HIGH (ref 70–99)
Glucose-Capillary: 112 mg/dL — ABNORMAL HIGH (ref 70–99)

## 2019-11-14 MED ORDER — TRAVASOL 10 % IV SOLN
INTRAVENOUS | Status: AC
  Filled 2019-11-14: qty 1188

## 2019-11-14 NOTE — Progress Notes (Signed)
Referring Physician(s): [place]  Supervising Physician: Jacqulynn Cadet  Patient Status:  [facility] - In-pt  Chief Complaint:  Pelvic/buttocks pain, abscesses  Subjective: Patient continues to have some soreness at right transgluteal drain site as well as mild pain right lower anterior abd drain as well.  Denies nausea or vomiting. Feculent output continues from transgluteal drain.     Allergies: Patient has no known allergies.  Medications: Prior to Admission medications   Medication Sig Start Date End Date Taking? Authorizing Provider  acetaminophen (TYLENOL) 500 MG tablet Take 500 mg by mouth every 6 (six) hours as needed for moderate pain.   Yes [provider]  gabapentin (NEURONTIN) 300 MG capsule Take 2 capsules (600 mg total) by mouth 2 (two) times daily. Patient taking differently: Take 600 mg by mouth 2 (two) times daily as needed (pain).  09/04/19  Yes Owens Shark, NP  ibuprofen (ADVIL) 200 MG tablet Take 400 mg by mouth every 6 (six) hours as needed for moderate pain.   Yes [provider]  LORazepam (ATIVAN) 0.5 MG tablet Take 1 tablet (0.5 mg total) by mouth every 8 (eight) hours as needed for anxiety. Or nausea. Do not drive while taking 14/78/29  Yes Owens Shark, NP  oxyCODONE (OXY IR/ROXICODONE) 5 MG immediate release tablet Take 1-2 tablets (5-10 mg total) by mouth every 6 (six) hours as needed for moderate pain, severe pain or breakthrough pain. 5/62/13  Yes Leighton Ruff, MD  lidocaine-prilocaine (EMLA) cream Apply 1 application topically as directed. Apply 1 hour prior to stick and cover with plastic wrap Patient not taking: Reported on 09/25/2019 07/10/19   Ladell Pier, MD     Vital Signs: BP 136/82 (BP Location: Left Arm)   Pulse 79   Temp 98.5 F (36.9 C)   Resp 17   Ht 6' (1.829 m)   Wt 160 lb (72.6 kg)   SpO2 100%   BMI 21.70 kg/m   Physical Exam awake, alert.  Right transgluteal /right lower quadrant abdominal  drains intact, insertion sites tender to palpation, more so with transgluteal drain.  Output from transgluteal drain remains green/feculent; drain flushes without difficulty.  Right lower quadrant drain with small amount of light yellow fluid with tissue debris; drain flushed with minimal return of fluid  Imaging: IR Catheter Tube Change  Result Date: 11/12/2019 INDICATION: 44 year old male with a history of rectal adenocarcinoma status post robotic assisted low anterior resection on 08/65/7846 complicated by anastomotic dehiscence and intra-abdominal abscess formation. He currently has a right transgluteal perirectal drainage catheter which is causing him increasing pain. On CT imaging, the catheter appears to be pulled back and is just deep to the piriformis muscle. He presents today for catheter exchange and repositioning. EXAM: Abscess drain exchange MEDICATIONS: The patient is currently admitted to the hospital and receiving intravenous antibiotics. The antibiotics were administered within an appropriate time frame prior to the initiation of the procedure. ANESTHESIA/SEDATION: 50 mcg fentanyl administered for pain control. This does not constitute moderate sedation. COMPLICATIONS: None immediate. PROCEDURE: Informed written consent was obtained from the patient after a thorough discussion of the procedural risks, benefits and alternatives. All questions were addressed. Maximal Sterile Barrier Technique was utilized including caps, mask, sterile gowns, sterile gloves, sterile drape, hand hygiene and skin antiseptic. A timeout was performed prior to the initiation of the procedure. A gentle hand injection of contrast material was performed through the existing catheter. The catheter is at the posterior most aspect of the  abscess cavity. The abscess cavity extends more anteriorly toward the mid pelvis. There is frank filling of the distal most rectum/anus. The retention suture was cut. The catheter was  transected and removed over a Bentson wire. A 5 French angled catheter was advanced over the wire and used to manipulate the wire into the more anterior component of the contained perforation. A new Cook 12 [place] all-purpose drainage catheter was then advanced over the wire and formed. There is return of frankly feculent material. The catheter was gently flushed with saline and connected to bag drainage. The catheter was secured to the skin with an adhesive fixation device. IMPRESSION: 1. Obvious cellulitis of the tissue surrounding the drain exit site. This is likely due to fecal bacteria tracking back along the tube to the skin surface. 2. Successful exchange and repositioning of drainage catheter deeper within the contained anastomotic perforation. 3. Drainage material is frankly feculent and highly likely the result in clogging of this drainage catheter. Consideration could be given to progressive up sizing as drains currently on back order become available. PLAN: 1. Flush drainage catheter at least once per shift. 2. Maintain drain to gravity bag. 3. Consider continued antibiotics for cellulitis. Electronically Signed   By: Jacqulynn Cadet M.D.   On: 11/12/2019 08:37    Labs:  CBC: Recent Labs    11/02/19 0430 11/05/19 0436 11/08/19 0434 11/10/19 0300  WBC 12.0* 11.6* 9.0 7.0  HGB 9.9* 9.5* 9.2* 9.9*  HCT 30.6* 29.1* 28.9* 31.1*  PLT 569* 470* 480* 492*    COAGS: Recent Labs    11/01/19 0731  INR 1.2    BMP: Recent Labs    11/09/19 0300 11/10/19 0300 11/11/19 0340 11/13/19 0327  NA 140 140 137 137  K 3.9 4.6 4.3 4.4  CL 106 105 103 103  CO2 26 25 24 25   GLUCOSE 117* 116* 130* 121*  BUN 9 12 15 19   CALCIUM 8.7* 8.9 8.8* 9.0  CREATININE 0.80 0.82 0.68 0.60*  GFRNONAA >60 >60 >60 >60  GFRAA >60 >60 >60 >60    LIVER FUNCTION TESTS: Recent Labs    11/09/19 0300 11/10/19 0300 11/11/19 0340 11/13/19 0327  BILITOT 0.7 0.5 0.5 0.5  AST 17 16 15 18   ALT 23 24 22 20     ALKPHOS 181* 188* 163* 155*  PROT 6.4* 6.7 6.6 6.8  ALBUMIN 2.6* 2.7* 2.7* 2.9*    Assessment and Plan: 44 y/o M with history of colorectal cancer s/p robotic assisted low anterior resection 10/15/19 with subsequent development of multiple intra-abdominal abscesses. IR placed abdominal drains x 3on 5/22; RLQ drain removed 5/28; right transgluteal drain exchanged/repositioned on 6/1; afebrile, no new labs; with minimal output from anterior abdominal drain will plan follow-up CT over the weekend to assess for possible removal.   Electronically Signed: D. Rowe Robert, PA-C 11/14/2019, 2:41 PM   I spent a total of 15 minutes at the the patient's bedside AND on the patient's hospital floor or unit, greater than 50% of which was counseling/coordinating care for abdominal/pelvic abscess drains    Patient ID: Brandon Barry, male   DOB: Apr 25, 1976, 44 y.o.   MRN: [id number]

## 2019-11-14 NOTE — Progress Notes (Signed)
Postprocedural intraabdominal abscess  Subjective: Pain better with pca.    Lidocaine patch not working well Objective: Vital signs in last 24 hours: Temp:  [98.2 F (36.8 C)-99 F (37.2 C)] 99 F (37.2 C) (06/04 0454) Pulse Rate:  [73-87] 87 (06/04 0454) Resp:  [15-21] 18 (06/04 0222) BP: (121-127)/(77-90) 127/79 (06/04 0454) SpO2:  [97 %-100 %] 98 % (06/04 0454) Last BM Date: 11/12/19  Intake/Output from previous day: 06/03 0701 - 06/04 0700 In: 2223.5 [P.O.:200; I.V.:2003.5] Out: 1370 [Urine:1350; Drains:20] Intake/Output this shift: No intake/output data recorded.  General appearance: alert and cooperative GI: normal findings: soft, non-distended, appropriately tender Incision/Wound: clean, dry, intact Drains with purulent output TG drain with stool  Lab Results:  Results for orders placed or performed during the hospital encounter of 10/31/19 (from the past 24 hour(s))  Glucose, capillary     Status: Abnormal   Collection Time: 11/13/19 11:30 AM  Result Value Ref Range   Glucose-Capillary 110 (H) 70 - 99 mg/dL  Glucose, capillary     Status: None   Collection Time: 11/13/19  5:28 PM  Result Value Ref Range   Glucose-Capillary 89 70 - 99 mg/dL  Glucose, capillary     Status: Abnormal   Collection Time: 11/13/19  8:11 PM  Result Value Ref Range   Glucose-Capillary 123 (H) 70 - 99 mg/dL  Glucose, capillary     Status: Abnormal   Collection Time: 11/14/19  4:57 AM  Result Value Ref Range   Glucose-Capillary 125 (H) 70 - 99 mg/dL     Studies/Results Radiology: CT shows improving fluid collections, possible SBO     MEDS, Scheduled . acetaminophen  500 mg Oral TID WC & HS  . Chlorhexidine Gluconate Cloth  6 each Topical Daily  . enoxaparin (LOVENOX) injection  40 mg Subcutaneous Q24H  . gabapentin  600 mg Oral TID  . HYDROmorphone   Intravenous Q4H  .  HYDROmorphone (DILAUDID) injection  1 mg Intravenous Once  . insulin aspart  0-9 Units Subcutaneous 3  times per day  . lidocaine  1 patch Transdermal Q24H  . lip balm  1 application Topical BID  . metoprolol tartrate  12.5 mg Oral BID  . sodium chloride flush  5 mL Intracatheter Q8H     Assessment: Postprocedural intraabdominal abscess Drains: per IR  Plan: NPO, ok for sips, chips, gum, hard candy and PO meds Cont PICC and TPN due to intolerance of PO.  Anticipate he will need TPN at home as well.  Currently cycling to TPN at night Can try PO pain meds as tolerated Will cont PCA for better pain control D/c lidocaine patch Ambulate in hall ID: completed course of Zosyn and Eraxis.  Wbc wnl Ok to shower if drains are covered  Drain site pain: cont more frequent flushing, appreciate any help by IR team with this as pain control is mostly what's keeping him here at this point Will most likely need OR fecal diversion once abd inflammation better (1-2 months)   LOS: 13 days    Rosario Adie, MD [facility], [place]    11/14/2019 7:41 AM

## 2019-11-14 NOTE — Progress Notes (Signed)
PHARMACY - TOTAL PARENTERAL NUTRITION CONSULT NOTE   Indication: prolonged PO intolerance, bowel rest  Patient Measurements: Height: 6' (182.9 cm) Weight: 72.6 kg (160 lb) IBW/kg (Calculated) : 77.6 TPN AdjBW (KG): 72.6 Body mass index is 21.7 kg/m. Usual Weight: 78-82 kg prior to multiple hospital admissions/surgeries  Assessment: 60 yoM with colorectal cancer who underwent neoadjuvant chemo/rads followed by low anterior resection 02/18/34, complicated by breakdown of the rectal anastomosis with multiple associated intra-abdominal abscesses. Readmitted 5/21 and currently undergoing conservative management with drain placement/antibiotics without repair of anastomotic leak. Inconsistent tolerance of diet over past 6 days and Pharmacy now consulted to dose TPN.  Glucose / Insulin: no Hx DM; excellent CBG control even with cycling.  CBGs at goal <150 (89-125); 2 units SSI required in past 24 hrs Electrolytes: stable WNL (6/3) Renal: SCr, bicarb stable wnl; BUN rising but remains WNL (6/3); UOP borderline low Hepatic: alk phos slightly elevated but continues to improve; Tbili, other enzymes stable WNL; albumin low but slightly improved (6/3) Prealbumin / TG: Prealbumin low but increasing; TG stable WNL (5/31) I/O: D5-LR @ KVO; up 2.1L this admission GI Imaging: - 5/21 CT A/P: anastomotic breakdown at rectal anastomosis w/ extraluminal stool; intra-abdominal abscess, SBO - 5/27 CT A/P: all abscesses reduced in size; concern for ileus or possibly distal SBO Surgeries / Procedures: - 5/5 LAR (see above) - 5/22 CT-guided drain placement x 3 (2 in RLQ, 1 transgluteal): purulent fluid +/- fecal material removed from all drains - RLQ drain removed 5/28 - 6/1 transgluteal drain exchanged d/t cellulitis at drain site & clogging d/t fecal material  Central access: PICC  TPN start date: 5/28   Nutritional Goals  RD recs 5/28: Kcal:  2200-2400, Protein:  105-120g, Fluid:  2.2L/day  -Changed to  cyclic TPN on 7/01- current 12h cycle provides: 2151 kcal & 119 g protein  Current Nutrition:  NPO and TPN  Plan:   Continue goal TPN; decrease cycle to 2160 ml given over 12 hr (max glucose infusion rate 6.3 after maxing out protein and reducing dextrose slightly)  Electrolytes in TPN: no changes from yesterday: Na 67mq/L, K 540m/L, Ca 3m34mL, Mg 3mE51m, Phos 13mm82m; Cl:Ac ratio 1:1  Add standard MVI and trace elements to TPN  D/C SSI.  Will continue to check CBG before & after starting TPN for another 24 hours.   Continue MIVF at KVO  [facility] TPN labs on Mon/Thurs  MicheNetta CedarsrmD, BCPS 11/14/2019, 8:13 AM

## 2019-11-15 ENCOUNTER — Inpatient Hospital Stay (HOSPITAL_COMMUNITY): Payer: Commercial Managed Care - PPO

## 2019-11-15 LAB — GLUCOSE, CAPILLARY
Glucose-Capillary: 119 mg/dL — ABNORMAL HIGH (ref 70–99)
Glucose-Capillary: 128 mg/dL — ABNORMAL HIGH (ref 70–99)
Glucose-Capillary: 132 mg/dL — ABNORMAL HIGH (ref 70–99)
Glucose-Capillary: 93 mg/dL (ref 70–99)

## 2019-11-15 MED ORDER — SODIUM CHLORIDE (PF) 0.9 % IJ SOLN
INTRAMUSCULAR | Status: AC
  Filled 2019-11-15: qty 50

## 2019-11-15 MED ORDER — TRAVASOL 10 % IV SOLN
INTRAVENOUS | Status: AC
  Filled 2019-11-15: qty 1188

## 2019-11-15 MED ORDER — IOHEXOL 300 MG/ML  SOLN
100.0000 mL | Freq: Once | INTRAMUSCULAR | Status: AC | PRN
  Administered 2019-11-15: 100 mL via INTRAVENOUS

## 2019-11-15 NOTE — Progress Notes (Signed)
PHARMACY - TOTAL PARENTERAL NUTRITION CONSULT NOTE   Indication: prolonged PO intolerance, bowel rest  Patient Measurements: Height: 6' (182.9 cm) Weight: 72.6 kg (160 lb) IBW/kg (Calculated) : 77.6 TPN AdjBW (KG): 72.6 Body mass index is 21.7 kg/m. Usual Weight: 78-82 kg prior to multiple hospital admissions/surgeries  Assessment: 63 yoM with colorectal cancer who underwent neoadjuvant chemo/rads followed by low anterior resection 12/19/42, complicated by breakdown of the rectal anastomosis with multiple associated intra-abdominal abscesses. Readmitted 5/21 and currently undergoing conservative management with drain placement/antibiotics without repair of anastomotic leak. Inconsistent tolerance of diet over past 6 days and Pharmacy now consulted to dose TPN.  Glucose / Insulin: no Hx DM; excellent CBG control even with cycling.  CBGs at goal <150 (112-132) Electrolytes: stable WNL (6/3) Renal: SCr, bicarb stable wnl; BUN rising but remains WNL (6/3); UOP 1.1L (6/4) Hepatic: alk phos slightly elevated but continues to improve; Tbili, other enzymes stable WNL; albumin low but slightly improved (6/3) Prealbumin / TG: Prealbumin low but increasing; TG stable WNL (5/31) I/O: D5-LR @ KVO GI Imaging: - 5/21 CT A/P: anastomotic breakdown at rectal anastomosis w/ extraluminal stool; intra-abdominal abscess, SBO - 5/27 CT A/P: all abscesses reduced in size; concern for ileus or possibly distal SBO Surgeries / Procedures: - 5/5 LAR (see above) - 5/22 CT-guided drain placement x 3 (2 in RLQ, 1 transgluteal): purulent fluid +/- fecal material removed from all drains - RLQ drain removed 5/28 - 6/1 transgluteal drain exchanged d/t cellulitis at drain site & clogging d/t fecal material  Central access: PICC  TPN start date: 5/28   Nutritional Goals  RD recs 5/28: Kcal:  2200-2400, Protein:  105-120g, Fluid:  2.2L/day  -Changed to cyclic TPN on 4/61- current 12h cycle provides: 2151 kcal & 119 g  protein  Current Nutrition:  NPO and TPN  Plan:   Continue goal cyclic TPN; decrease cycle to 2160 ml given over 12 hr (max glucose infusion rate 6.3 after maxing out protein and reducing dextrose slightly)  Electrolytes in TPN: no changes: Na 39mq/L, K 586m/L, Ca 67m53mL, Mg 67mE50m, Phos 167mm43m; Cl:Ac ratio 1:1  Add standard MVI and trace elements to TPN  D/C SSI.  Will continue to check CBG before & after starting TPN for another 24 hours.   Continue MIVF at KVO  [facility]Th[facility]  GreenMinda DittomD 11/15/2019, 8:20 AM

## 2019-11-15 NOTE — Progress Notes (Signed)
Referring Physician(s): Gross, Remo Lipps (CCS)  Supervising Physician: Arne Cleveland  Patient Status:  [facility] - In-pt  Chief Complaint: None  Subjective:  History of CRC s/p robotic assisted lower anterior resection in OR 10/15/2019 by Dr. [name]; with subsequent development of multiple intra-abdominal/pelvic fluid collections s/p RLQ drain placements x 2 along with right TG drain placement in IR 11/01/2019 by Dr. Anselm Pancoast; s/p removal of 1 of the RLQ drains 11/07/2019; s/p exchange of right TG drain 11/11/2019 in IR by Dr. Laurence Ferrari (patient currently has 2 IR drains- 1 RLQ and 1 right TG). Patient awake and alert laying in bed with no complaints at this time. RLQ and right TG drain sites c/d/i.   Allergies: Patient has no known allergies.  Medications: Prior to Admission medications   Medication Sig Start Date End Date Taking? Authorizing Provider  acetaminophen (TYLENOL) 500 MG tablet Take 500 mg by mouth every 6 (six) hours as needed for moderate pain.   Yes [provider]  gabapentin (NEURONTIN) 300 MG capsule Take 2 capsules (600 mg total) by mouth 2 (two) times daily. Patient taking differently: Take 600 mg by mouth 2 (two) times daily as needed (pain).  09/04/19  Yes Owens Shark, NP  ibuprofen (ADVIL) 200 MG tablet Take 400 mg by mouth every 6 (six) hours as needed for moderate pain.   Yes [provider]  LORazepam (ATIVAN) 0.5 MG tablet Take 1 tablet (0.5 mg total) by mouth every 8 (eight) hours as needed for anxiety. Or nausea. Do not drive while taking 95/28/41  Yes Owens Shark, NP  oxyCODONE (OXY IR/ROXICODONE) 5 MG immediate release tablet Take 1-2 tablets (5-10 mg total) by mouth every 6 (six) hours as needed for moderate pain, severe pain or breakthrough pain. 09/03/38  Yes Leighton Ruff, MD  lidocaine-prilocaine (EMLA) cream Apply 1 application topically as directed. Apply 1 hour prior to stick and cover with plastic wrap Patient not taking: Reported on  09/25/2019 07/10/19   Ladell Pier, MD     Vital Signs: BP 131/77 (BP Location: Left Arm)   Pulse 89   Temp 99.1 F (37.3 C) (Oral)   Resp 18   Ht 6' (1.829 m)   Wt 160 lb (72.6 kg)   SpO2 100%   BMI 21.70 kg/m   Physical Exam Vitals and nursing note reviewed.  Constitutional:      General: He is not in acute distress.    Appearance: Normal appearance.  Pulmonary:     Effort: Pulmonary effort is normal. No respiratory distress.  Abdominal:     Comments: RLQ drain site without tenderness, erythema, drainage, or active bleeding; approximately 10 cc clear yellow fluid in suction bulb- this was switched to gravity bag today. Right TG drain site with mild tenderness, no erythema, drainage, or active bleeding; approximately 25 cc feculent output in gravity bag.   Skin:    General: Skin is warm and dry.  Neurological:     Mental Status: He is alert and oriented to person, place, and time.     Imaging: IR Catheter Tube Change  Result Date: 11/12/2019 INDICATION: 44 year old male with a history of rectal adenocarcinoma status post robotic assisted low anterior resection on 04/08/2535 complicated by anastomotic dehiscence and intra-abdominal abscess formation. He currently has a right transgluteal perirectal drainage catheter which is causing him increasing pain. On CT imaging, the catheter appears to be pulled back and is just deep to the piriformis muscle. He presents today for catheter  exchange and repositioning. EXAM: Abscess drain exchange MEDICATIONS: The patient is currently admitted to the hospital and receiving intravenous antibiotics. The antibiotics were administered within an appropriate time frame prior to the initiation of the procedure. ANESTHESIA/SEDATION: 50 mcg fentanyl administered for pain control. This does not constitute moderate sedation. COMPLICATIONS: None immediate. PROCEDURE: Informed written consent was obtained from the patient after a thorough discussion of  the procedural risks, benefits and alternatives. All questions were addressed. Maximal Sterile Barrier Technique was utilized including caps, mask, sterile gowns, sterile gloves, sterile drape, hand hygiene and skin antiseptic. A timeout was performed prior to the initiation of the procedure. A gentle hand injection of contrast material was performed through the existing catheter. The catheter is at the posterior most aspect of the abscess cavity. The abscess cavity extends more anteriorly toward the mid pelvis. There is frank filling of the distal most rectum/anus. The retention suture was cut. The catheter was transected and removed over a Bentson wire. A 5 French angled catheter was advanced over the wire and used to manipulate the wire into the more anterior component of the contained perforation. A new Cook 12 [place] all-purpose drainage catheter was then advanced over the wire and formed. There is return of frankly feculent material. The catheter was gently flushed with saline and connected to bag drainage. The catheter was secured to the skin with an adhesive fixation device. IMPRESSION: 1. Obvious cellulitis of the tissue surrounding the drain exit site. This is likely due to fecal bacteria tracking back along the tube to the skin surface. 2. Successful exchange and repositioning of drainage catheter deeper within the contained anastomotic perforation. 3. Drainage material is frankly feculent and highly likely the result in clogging of this drainage catheter. Consideration could be given to progressive up sizing as drains currently on back order become available. PLAN: 1. Flush drainage catheter at least once per shift. 2. Maintain drain to gravity bag. 3. Consider continued antibiotics for cellulitis. Electronically Signed   By: Jacqulynn Cadet M.D.   On: 11/12/2019 08:37    Labs:  CBC: Recent Labs    11/02/19 0430 11/05/19 0436 11/08/19 0434 11/10/19 0300  WBC 12.0* 11.6* 9.0 7.0  HGB 9.9*  9.5* 9.2* 9.9*  HCT 30.6* 29.1* 28.9* 31.1*  PLT 569* 470* 480* 492*    COAGS: Recent Labs    11/01/19 0731  INR 1.2    BMP: Recent Labs    11/09/19 0300 11/10/19 0300 11/11/19 0340 11/13/19 0327  NA 140 140 137 137  K 3.9 4.6 4.3 4.4  CL 106 105 103 103  CO2 26 25 24 25   GLUCOSE 117* 116* 130* 121*  BUN 9 12 15 19   CALCIUM 8.7* 8.9 8.8* 9.0  CREATININE 0.80 0.82 0.68 0.60*  GFRNONAA >60 >60 >60 >60  GFRAA >60 >60 >60 >60    LIVER FUNCTION TESTS: Recent Labs    11/09/19 0300 11/10/19 0300 11/11/19 0340 11/13/19 0327  BILITOT 0.7 0.5 0.5 0.5  AST 17 16 15 18   ALT 23 24 22 20   ALKPHOS 181* 188* 163* 155*  PROT 6.4* 6.7 6.6 6.8  ALBUMIN 2.6* 2.7* 2.7* 2.9*    Assessment and Plan:  History of CRC s/p robotic assisted lower anterior resection in OR 10/15/2019 by Dr. [name]; with subsequent development of multiple intra-abdominal/pelvic fluid collections s/p RLQ drain placements x 2 along with right TG drain placement in IR 11/01/2019 by Dr. Anselm Pancoast; s/p removal of 1 of the RLQ drains 11/07/2019; s/p exchange  of right TG drain 11/11/2019 in IR by Dr. Laurence Ferrari (patient currently has 2 IR drains- 1 RLQ and 1 right TG). RLQ drain stable with approximately 10 cc clear yellow fluid in suction bulb (additional 5 cc output from drain in past 24 hours per chart). Right TG drain stable with approximately 25 cc feculent output in gravity bag (additional 15 cc output from drain in past 24 hours per chart). CT abdomen/pelvis from today reviewed by Dr. Vernard Gambles who recommends both drains remain at this time (right TG drain still within large fluid collection; RLQ drain fluid collection improved but still draining); right TG drain switched to gravity per Dr. Vernard Gambles. Continue current drain management- continue with Qshift flushes/monitor of output. Plan for repeat CT/possible drain injection when output <10 cc/day (assess for possible removal). Further plans per [facility]- appreciate and agree  with management. IR to follow.   Electronically Signed: Earley Abide, PA-C 11/15/2019, 12:06 PM   I spent a total of 25 Minutes at the the patient's bedside AND on the patient's hospital floor or unit, greater than 50% of which was counseling/coordinating care for intraabdominal abscesses x3 s/p drain placements x3.

## 2019-11-15 NOTE — Progress Notes (Signed)
   Subjective/Chief Complaint: Complains of severe pain from the transgluteal drain Denies abdominal pain Has BM daily  Objective: Vital signs in last 24 hours: Temp:  [98.5 F (36.9 C)-99.4 F (37.4 C)] 99.1 F (37.3 C) (06/05 0605) Pulse Rate:  [67-89] 89 (06/05 0605) Resp:  [14-18] 18 (06/05 0605) BP: (116-136)/(75-89) 131/77 (06/05 0605) SpO2:  [97 %-100 %] 100 % (06/05 0800) Last BM Date: 11/14/19  Intake/Output from previous day: 06/04 0701 - 06/05 0700 In: 380 [P.O.:220; I.V.:160] Out: 1170 [Urine:1150; Drains:20] Intake/Output this shift: No intake/output data recorded.  Exam: Looks uncomfortable Abdomen is soft, non-distended, minimally tender, no peritonitis Wound stable Drains with purulence and stool  Lab Results:  No results for input(s): WBC, HGB, HCT, PLT in the last 72 hours. BMET Recent Labs    11/13/19 0327  NA 137  K 4.4  CL 103  CO2 25  GLUCOSE 121*  BUN 19  CREATININE 0.60*  CALCIUM 9.0   PT/INR No results for input(s): LABPROT, INR in the last 72 hours. ABG No results for input(s): PHART, HCO3 in the last 72 hours.  Invalid input(s): PCO2, PO2  Studies/Results: No results found.  Anti-infectives: Anti-infectives (From admission, onward)   Start     Dose/Rate Route Frequency Ordered Stop   11/03/19 1000  anidulafungin (ERAXIS) 100 mg in sodium chloride 0.9 % 100 mL IVPB  Status:  Discontinued     100 mg 78 mL/hr over 100 Minutes Intravenous Every 24 hours 11/02/19 0658 11/10/19 0802   11/02/19 0800  anidulafungin (ERAXIS) 200 mg in sodium chloride 0.9 % 200 mL IVPB     200 mg 78 mL/hr over 200 Minutes Intravenous  Once 11/02/19 0704 11/02/19 1138   10/31/19 1800  piperacillin-tazobactam (ZOSYN) IVPB 3.375 g  Status:  Discontinued     3.375 g 12.5 mL/hr over 240 Minutes Intravenous Every 8 hours 10/31/19 1719 11/10/19 0802      Assessment/Plan: Postprocedural intraabdominal abscess Drains: per IR  Continue current care,  NPO, TPN Repeat CT today Try to adjust pain control On PCA  LOS: 14 days    Coralie Keens 11/15/2019

## 2019-11-15 NOTE — Progress Notes (Signed)
Pt needing to be reminded multiple times not to unhook himself from PCA monitors, or push buttons on the PCA,  but pt seems to forget instructions.   Pt found anxious in room. Pt had disconnected himself from the PCA monitors and clamped all of his tubing, including his PICC. Pt stated that he was having a dream that  "my nurse was fired and I would be stranded here alone, so I was trying to get out of here." Pt again instructed to not touch anything on his PCA or tubing and to call for nurse if he has concerns. Pt verbalizes understanding. Pt sating 98% on room air, pulse 85, once in bed again.

## 2019-11-16 LAB — CBC
HCT: 33.2 % — ABNORMAL LOW (ref 39.0–52.0)
Hemoglobin: 10.7 g/dL — ABNORMAL LOW (ref 13.0–17.0)
MCH: 29.6 pg (ref 26.0–34.0)
MCHC: 32.2 g/dL (ref 30.0–36.0)
MCV: 91.7 fL (ref 80.0–100.0)
Platelets: 612 10*3/uL — ABNORMAL HIGH (ref 150–400)
RBC: 3.62 MIL/uL — ABNORMAL LOW (ref 4.22–5.81)
RDW: 13.5 % (ref 11.5–15.5)
WBC: 9.2 10*3/uL (ref 4.0–10.5)
nRBC: 0 % (ref 0.0–0.2)

## 2019-11-16 LAB — GLUCOSE, CAPILLARY
Glucose-Capillary: 100 mg/dL — ABNORMAL HIGH (ref 70–99)
Glucose-Capillary: 126 mg/dL — ABNORMAL HIGH (ref 70–99)

## 2019-11-16 MED ORDER — TRAVASOL 10 % IV SOLN
INTRAVENOUS | Status: AC
  Filled 2019-11-16: qty 1188

## 2019-11-16 MED ORDER — KETOROLAC TROMETHAMINE 30 MG/ML IJ SOLN
30.0000 mg | Freq: Three times a day (TID) | INTRAMUSCULAR | Status: DC
  Administered 2019-11-16 – 2019-11-17 (×4): 30 mg via INTRAVENOUS
  Filled 2019-11-16 (×5): qty 1

## 2019-11-16 NOTE — Progress Notes (Signed)
PHARMACY - TOTAL PARENTERAL NUTRITION CONSULT NOTE   Indication: prolonged PO intolerance, bowel rest  Patient Measurements: Height: 6' (182.9 cm) Weight: 72.6 kg (160 lb) IBW/kg (Calculated) : 77.6 TPN AdjBW (KG): 72.6 Body mass index is 21.7 kg/m. Usual Weight: 78-82 kg prior to multiple hospital admissions/surgeries  Assessment: 10 yoM with colorectal cancer who underwent neoadjuvant chemo/rads followed by low anterior resection 10/13/59, complicated by breakdown of the rectal anastomosis with multiple associated intra-abdominal abscesses. Readmitted 5/21 and currently undergoing conservative management with drain placement/antibiotics without repair of anastomotic leak. Inconsistent tolerance of diet over past 6 days and Pharmacy now consulted to dose TPN.  Glucose / Insulin: no Hx DM; excellent CBG control even with cycling.  CBGs at goal <150 (93 - 126) CBG 126 & 128 during TPN infusion Electrolytes: stable WNL (6/3) Renal: SCr, bicarb stable wnl; BUN rising but remains WNL (6/3); UOP 1.6L (6/5) Hepatic: alk phos slightly elevated but continues to improve; Tbili, other enzymes stable WNL; albumin low but slightly improved (6/3) Prealbumin / TG: Prealbumin low but increasing; TG stable WNL (5/31) I/O: D5-LR @ KVO GI Imaging: - 5/21 CT A/P: anastomotic breakdown at rectal anastomosis w/ extraluminal stool; intra-abdominal abscess, SBO - 5/27 CT A/P: all abscesses reduced in size; concern for ileus or possibly distal SBO Surgeries / Procedures: - 5/5 LAR (see above) - 5/22 CT-guided drain placement x 3 (2 in RLQ, 1 transgluteal): purulent fluid +/- fecal material removed from all drains - RLQ drain removed 5/28 - 6/1 transgluteal drain exchanged d/t cellulitis at drain site & clogging d/t fecal material  Central access: PICC  TPN start date: 5/28   Nutritional Goals  RD recs 5/28: Kcal:  2200-2400, Protein:  105-120g, Fluid:  2.2L/day  -Changed to cyclic TPN on 4/43- current  12h cycle provides: 2151 kcal & 119 g protein  Current Nutrition:  NPO and TPN  Plan:   Continue goal cyclic TPN; decrease cycle to 2160 ml given over 12 hr (max glucose infusion rate 6.3 after maxing out protein and reducing dextrose slightly)  Electrolytes in TPN: no changes: Na 38mq/L, K 592m/L, Ca 23m10mL, Mg 23mE64m, Phos 123mm66m; Cl:Ac ratio 1:1  Add standard MVI and trace elements to TPN  D/C CBGs (mid-infusion CBG 128 and 126)   Continue MIVF at KVO  [facility]  GreenMinda DittomD 11/16/2019, 8:31 AM

## 2019-11-16 NOTE — Progress Notes (Signed)
Patient gluteal drain is leaking. Dr. Redmond Pulling made aware. Order to reinforce dressing.

## 2019-11-16 NOTE — Progress Notes (Signed)
Subjective/Chief Complaint: Still having significant pain at buttock drain. Felt a "blow out" earlier and had increased drainage around the transgluteal drain. Having to use the PCA a lot.  Feels distended   Objective: Vital signs in last 24 hours: Temp:  [98 F (36.7 C)-98.5 F (36.9 C)] 98 F (36.7 C) (06/06 0622) Pulse Rate:  [73-91] 91 (06/06 0622) Resp:  [16-18] 18 (06/06 0622) BP: (125-132)/(84-90) 132/90 (06/06 0622) SpO2:  [97 %-100 %] 100 % (06/06 0622) Last BM Date: 11/15/19  Intake/Output from previous day: 06/05 0701 - 06/06 0700 In: 3365.4 [P.O.:180; I.V.:3185.4] Out: 1150 [Urine:1150] Intake/Output this shift: No intake/output data recorded.  Exam: Looks uncomfortable Abdomen distended, no peritonitis Abdominal drain with purulence Transgluteal drain with fecal drainage around it  Lab Results:  No results for input(s): WBC, HGB, HCT, PLT in the last 72 hours. BMET No results for input(s): NA, K, CL, CO2, GLUCOSE, BUN, CREATININE, CALCIUM in the last 72 hours. PT/INR No results for input(s): LABPROT, INR in the last 72 hours. ABG No results for input(s): PHART, HCO3 in the last 72 hours.  Invalid input(s): PCO2, PO2  Studies/Results: CT ABDOMEN PELVIS W CONTRAST  Result Date: 11/16/2019 CLINICAL DATA:  Intra-abdominal abscess, status post drainage x3 11/01/2019, revision of transgluteal catheter 11/11/2019, persistent pain EXAM: CT ABDOMEN AND PELVIS WITH CONTRAST TECHNIQUE: Multidetector CT imaging of the abdomen and pelvis was performed using the standard protocol following bolus administration of intravenous contrast. CONTRAST:  118mL OMNIPAQUE IOHEXOL 300 MG/ML  SOLN COMPARISON:  11/06/2019 and previous FINDINGS: Lower chest: Linear scarring or atelectasis at the right lung base as before. For no pleural or pericardial effusion. Hepatobiliary: Subcapsular hemangioma in segment 6/7 as before. 1 cm low-attenuation lesion in segment 7 is stable since  02/04/2019. No new lesion. No biliary ductal dilatation. Gallbladder unremarkable. Pancreas: Unremarkable. No pancreatic ductal dilatation or surrounding inflammatory changes. Spleen: Normal in size without focal abnormality. Adrenals/Urinary Tract: Adrenal glands unremarkable. Kidneys enhance normally without focal lesion or hydronephrosis. Thick-walled urinary bladder. Stomach/Bowel: Stomach is decompressed. Duodenum and proximal small bowel decompressed. Multiple dilated mid and distal small bowel loops. Transition region in the right lower quadrant. The terminal ileum is nondilated. The colon is nondilated. Anastomotic breakdown at the rectum with slight decrease in size of complex surrounding loculated perirectal gas and fluid collection 6.2 x 5.1 cm, with good positioning of right transgluteal drain catheter. Vascular/Lymphatic: No abdominal or pelvic adenopathy. Subcentimeter left para-aortic and aortocaval nodes noted. Reproductive: Prostate enlargement, protruding into the urinary bladder. Other: Right lower quadrant drain catheter stable. Interval decrease in size of surrounding gas and fluid collection 5.3 x 1.7 cm. No new or undrained fluid collections identified. No ascites. No free air. Musculoskeletal: No acute or significant osseous findings. IMPRESSION: 1. Rectal anastomotic breakdown with decrease in size of complex surrounding perirectal gas and fluid collection, good positioning of transgluteal drain catheter. 2. Persistent small bowel obstruction. Transitional region near the right lower quadrant abscess which is decreasing in size with well-positioned percutaneous drain catheter. Electronically Signed   By: Lucrezia Europe M.D.   On: 11/16/2019 08:19    Anti-infectives: Anti-infectives (From admission, onward)   Start     Dose/Rate Route Frequency Ordered Stop   11/03/19 1000  anidulafungin (ERAXIS) 100 mg in sodium chloride 0.9 % 100 mL IVPB  Status:  Discontinued     100 mg 78 mL/hr over  100 Minutes Intravenous Every 24 hours 11/02/19 0658 11/10/19 0802   11/02/19 0800  anidulafungin (  ERAXIS) 200 mg in sodium chloride 0.9 % 200 mL IVPB     200 mg 78 mL/hr over 200 Minutes Intravenous  Once 11/02/19 0704 11/02/19 1138   10/31/19 1800  piperacillin-tazobactam (ZOSYN) IVPB 3.375 g  Status:  Discontinued     3.375 g 12.5 mL/hr over 240 Minutes Intravenous Every 8 hours 10/31/19 1719 11/10/19 0802      Assessment/Plan: Postprocedural intraabdominal abscess Drains: per IR  Will have IR check the drain given increase leakage.  Issue is overall pain control with most of the pain being at the transgluteal drain site.  Will add toradol for pain  Check CBC this morning  CT scan shows both the collections are improving.  Still with dilated small bowel  Continue bowel rest and TNA  May end up needing diversion sooner than later if not improving \  LOS: 15 days    Coralie Keens MD 11/16/2019

## 2019-11-16 NOTE — Progress Notes (Signed)
Referring Physician(s): Gross, Remo Lipps ([place])  Supervising Physician: Arne Cleveland  Patient Status:  [facility] - In-pt  Chief Complaint: "Blow out"  Subjective:  History of CRC s/p robotic assisted lower anterior resection in OR 10/15/2019 by Dr. [name]; with subsequent development of multiple intra-abdominal/pelvic fluid collections s/p RLQ drain placements x 2 along with right TG drain placement in IR 11/01/2019 by Dr. Anselm Pancoast; s/p removal of 1 of the RLQ drains 11/07/2019; s/p exchange of right TG drain 11/11/2019 in IR by Dr. Laurence Ferrari (patient currently has 2 IR drains- 1 RLQ and 1 right TG). Patient laying in bed resting comfortably, CPAP in place. He responds to voice and answers questions appropriately. Spouse at bedside. Complains of "blow out" with associated increase in drainage around right TG drain earlier. Request from patient/CCS for re-evaluation of drain at this time.   Allergies: Patient has no known allergies.  Medications: Prior to Admission medications   Medication Sig Start Date End Date Taking? Authorizing Provider  acetaminophen (TYLENOL) 500 MG tablet Take 500 mg by mouth every 6 (six) hours as needed for moderate pain.   Yes [provider]  gabapentin (NEURONTIN) 300 MG capsule Take 2 capsules (600 mg total) by mouth 2 (two) times daily. Patient taking differently: Take 600 mg by mouth 2 (two) times daily as needed (pain).  09/04/19  Yes Owens Shark, NP  ibuprofen (ADVIL) 200 MG tablet Take 400 mg by mouth every 6 (six) hours as needed for moderate pain.   Yes [provider]  LORazepam (ATIVAN) 0.5 MG tablet Take 1 tablet (0.5 mg total) by mouth every 8 (eight) hours as needed for anxiety. Or nausea. Do not drive while taking 23/53/61  Yes Owens Shark, NP  oxyCODONE (OXY IR/ROXICODONE) 5 MG immediate release tablet Take 1-2 tablets (5-10 mg total) by mouth every 6 (six) hours as needed for moderate pain, severe pain or breakthrough pain. 4/43/15   Yes Leighton Ruff, MD  lidocaine-prilocaine (EMLA) cream Apply 1 application topically as directed. Apply 1 hour prior to stick and cover with plastic wrap Patient not taking: Reported on 09/25/2019 07/10/19   Ladell Pier, MD     Vital Signs: BP 128/84 (BP Location: Left Arm)   Pulse 88   Temp 98.6 F (37 C) (Oral)   Resp 18   Ht 6' (1.829 m)   Wt 160 lb (72.6 kg)   SpO2 100%   BMI 21.70 kg/m   Physical Exam Vitals and nursing note reviewed.  Constitutional:      General: He is not in acute distress.    Appearance: Normal appearance.  Pulmonary:     Effort: Pulmonary effort is normal. No respiratory distress.  Abdominal:     Comments: RLQ drain site without tenderness, erythema, drainage, or active bleeding; approximately 10 cc clear yellow fluid in suction bulb- this was switched to gravity bag today. Right TG drain site with mild tenderness and surrounding erythema, no active drainage or active bleeding, bandage/statlock with feculent output (bandage was changed, RN instructed to change statlock); approximately 25 cc feculent output in gravity bag; drain flushes/aspirates without resistance, with drainage into bag following flushing.  Skin:    General: Skin is warm and dry.  Neurological:     Mental Status: He is alert and oriented to person, place, and time.     Imaging: CT ABDOMEN PELVIS W CONTRAST  Result Date: 11/16/2019 CLINICAL DATA:  Intra-abdominal abscess, status post drainage x3 11/01/2019, revision of transgluteal catheter  11/11/2019, persistent pain EXAM: CT ABDOMEN AND PELVIS WITH CONTRAST TECHNIQUE: Multidetector CT imaging of the abdomen and pelvis was performed using the standard protocol following bolus administration of intravenous contrast. CONTRAST:  137mL OMNIPAQUE IOHEXOL 300 MG/ML  SOLN COMPARISON:  11/06/2019 and previous FINDINGS: Lower chest: Linear scarring or atelectasis at the right lung base as before. For no pleural or pericardial effusion.  Hepatobiliary: Subcapsular hemangioma in segment 6/7 as before. 1 cm low-attenuation lesion in segment 7 is stable since 02/04/2019. No new lesion. No biliary ductal dilatation. Gallbladder unremarkable. Pancreas: Unremarkable. No pancreatic ductal dilatation or surrounding inflammatory changes. Spleen: Normal in size without focal abnormality. Adrenals/Urinary Tract: Adrenal glands unremarkable. Kidneys enhance normally without focal lesion or hydronephrosis. Thick-walled urinary bladder. Stomach/Bowel: Stomach is decompressed. Duodenum and proximal small bowel decompressed. Multiple dilated mid and distal small bowel loops. Transition region in the right lower quadrant. The terminal ileum is nondilated. The colon is nondilated. Anastomotic breakdown at the rectum with slight decrease in size of complex surrounding loculated perirectal gas and fluid collection 6.2 x 5.1 cm, with good positioning of right transgluteal drain catheter. Vascular/Lymphatic: No abdominal or pelvic adenopathy. Subcentimeter left para-aortic and aortocaval nodes noted. Reproductive: Prostate enlargement, protruding into the urinary bladder. Other: Right lower quadrant drain catheter stable. Interval decrease in size of surrounding gas and fluid collection 5.3 x 1.7 cm. No new or undrained fluid collections identified. No ascites. No free air. Musculoskeletal: No acute or significant osseous findings. IMPRESSION: 1. Rectal anastomotic breakdown with decrease in size of complex surrounding perirectal gas and fluid collection, good positioning of transgluteal drain catheter. 2. Persistent small bowel obstruction. Transitional region near the right lower quadrant abscess which is decreasing in size with well-positioned percutaneous drain catheter. Electronically Signed   By: Lucrezia Europe M.D.   On: 11/16/2019 08:19    Labs:  CBC: Recent Labs    11/05/19 0436 11/08/19 0434 11/10/19 0300 11/16/19 0932  WBC 11.6* 9.0 7.0 9.2  HGB 9.5*  9.2* 9.9* 10.7*  HCT 29.1* 28.9* 31.1* 33.2*  PLT 470* 480* 492* 612*    COAGS: Recent Labs    11/01/19 0731  INR 1.2    BMP: Recent Labs    11/09/19 0300 11/10/19 0300 11/11/19 0340 11/13/19 0327  NA 140 140 137 137  K 3.9 4.6 4.3 4.4  CL 106 105 103 103  CO2 26 25 24 25   GLUCOSE 117* 116* 130* 121*  BUN 9 12 15 19   CALCIUM 8.7* 8.9 8.8* 9.0  CREATININE 0.80 0.82 0.68 0.60*  GFRNONAA >60 >60 >60 >60  GFRAA >60 >60 >60 >60    LIVER FUNCTION TESTS: Recent Labs    11/09/19 0300 11/10/19 0300 11/11/19 0340 11/13/19 0327  BILITOT 0.7 0.5 0.5 0.5  AST 17 16 15 18   ALT 23 24 22 20   ALKPHOS 181* 188* 163* 155*  PROT 6.4* 6.7 6.6 6.8  ALBUMIN 2.6* 2.7* 2.7* 2.9*    Assessment and Plan:  History of CRC s/p robotic assisted lower anterior resection in OR 10/15/2019 by Dr. [name]; with subsequent development of multiple intra-abdominal/pelvic fluid collections s/p RLQ drain placements x 2 along with right TG drain placement in IR 11/01/2019 by Dr. Anselm Pancoast; s/p removal of 1 of the RLQ drains 11/07/2019; s/p exchange of right TG drain 11/11/2019 in IR by Dr. Laurence Ferrari (patient currently has 2 IR drains- 1 RLQ and 1 right TG). RLQ drain stable with approximately10 cc clear yellow fluid in suction bulb. Right TG drain stable  with approximately25 cc feculent output in gravity bag. Request from patient/CCS for re-evaluation of right TG drain given increase drainage from drain puncture site. Drain currently functioning well, no signs of drainage on exam other than feculent output on dressing (again this was changed, RN instructed to change statlock). Discussed case with Dr. Vernard Gambles who states drain is in good position based on CT and is functioning well, therefore recommends continuing current drain management (Qshift flushes/monitor of output/maintainance to area to ensure c/d/i). Unfortunately, does not recommend upsizing drain, as this could result in further drainage around drain  puncture site, given size of rectal anastomotic breakdown. Plan for repeat CT/possible drain injection when output <10 cc/day (assess for possible removal). Further plans perCCS- appreciate and agree with management. IR to follow.   Electronically Signed: Earley Abide, PA-C 11/16/2019, 10:36 AM   I spent a total of 25 Minutes at the the patient's bedside AND on the patient's hospital floor or unit, greater than 50% of which was counseling/coordinating care for intraabdominal abscesses x3 s/p drain placements x3.

## 2019-11-17 ENCOUNTER — Ambulatory Visit: Payer: Commercial Managed Care - PPO | Attending: Oncology | Admitting: Physical Therapy

## 2019-11-17 LAB — CBC
HCT: 28.8 % — ABNORMAL LOW (ref 39.0–52.0)
Hemoglobin: 9.2 g/dL — ABNORMAL LOW (ref 13.0–17.0)
MCH: 29.5 pg (ref 26.0–34.0)
MCHC: 31.9 g/dL (ref 30.0–36.0)
MCV: 92.3 fL (ref 80.0–100.0)
Platelets: 449 10*3/uL — ABNORMAL HIGH (ref 150–400)
RBC: 3.12 MIL/uL — ABNORMAL LOW (ref 4.22–5.81)
RDW: 13.6 % (ref 11.5–15.5)
WBC: 5.4 10*3/uL (ref 4.0–10.5)
nRBC: 0 % (ref 0.0–0.2)

## 2019-11-17 LAB — COMPREHENSIVE METABOLIC PANEL
ALT: 18 U/L (ref 0–44)
AST: 16 U/L (ref 15–41)
Albumin: 2.7 g/dL — ABNORMAL LOW (ref 3.5–5.0)
Alkaline Phosphatase: 123 U/L (ref 38–126)
Anion gap: 10 (ref 5–15)
BUN: 23 mg/dL — ABNORMAL HIGH (ref 6–20)
CO2: 24 mmol/L (ref 22–32)
Calcium: 8.9 mg/dL (ref 8.9–10.3)
Chloride: 101 mmol/L (ref 98–111)
Creatinine, Ser: 0.59 mg/dL — ABNORMAL LOW (ref 0.61–1.24)
GFR calc Af Amer: >60 mL/min (ref >60)
GFR calc non Af Amer: >60 mL/min (ref >60)
Glucose, Bld: 111 mg/dL — ABNORMAL HIGH (ref 70–99)
Potassium: 4.3 mmol/L (ref 3.5–5.1)
Sodium: 135 mmol/L (ref 135–145)
Total Bilirubin: 0.4 mg/dL (ref 0.3–1.2)
Total Protein: 6.3 g/dL — ABNORMAL LOW (ref 6.5–8.1)

## 2019-11-17 LAB — PHOSPHORUS: Phosphorus: 3.8 mg/dL (ref 2.5–4.6)

## 2019-11-17 LAB — DIFFERENTIAL
Abs Immature Granulocytes: 0.02 10*3/uL (ref 0.00–0.07)
Basophils Absolute: 0 10*3/uL (ref 0.0–0.1)
Basophils Relative: 1 %
Eosinophils Absolute: 0.2 10*3/uL (ref 0.0–0.5)
Eosinophils Relative: 3 %
Immature Granulocytes: 0 %
Lymphocytes Relative: 10 %
Lymphs Abs: 0.5 10*3/uL — ABNORMAL LOW (ref 0.7–4.0)
Monocytes Absolute: 0.6 10*3/uL (ref 0.1–1.0)
Monocytes Relative: 11 %
Neutro Abs: 4 10*3/uL (ref 1.7–7.7)
Neutrophils Relative %: 75 %

## 2019-11-17 LAB — MAGNESIUM: Magnesium: 1.8 mg/dL (ref 1.7–2.4)

## 2019-11-17 LAB — GLUCOSE, CAPILLARY: Glucose-Capillary: 104 mg/dL — ABNORMAL HIGH (ref 70–99)

## 2019-11-17 LAB — TRIGLYCERIDES: Triglycerides: 93 mg/dL (ref <150)

## 2019-11-17 LAB — PREALBUMIN: Prealbumin: 13.8 mg/dL — ABNORMAL LOW (ref 18–38)

## 2019-11-17 MED ORDER — KETOROLAC TROMETHAMINE 30 MG/ML IJ SOLN
30.0000 mg | Freq: Three times a day (TID) | INTRAMUSCULAR | Status: AC
  Administered 2019-11-17 – 2019-11-19 (×6): 30 mg via INTRAVENOUS
  Filled 2019-11-17 (×6): qty 1

## 2019-11-17 MED ORDER — TRAVASOL 10 % IV SOLN
INTRAVENOUS | Status: AC
  Filled 2019-11-17: qty 1188

## 2019-11-17 MED ORDER — HYDROMORPHONE 1 MG/ML IV SOLN
INTRAVENOUS | Status: DC
  Administered 2019-11-17: 2.1 mg via INTRAVENOUS
  Administered 2019-11-17: 3.3 mg via INTRAVENOUS
  Administered 2019-11-17: 3.9 mg via INTRAVENOUS
  Administered 2019-11-17: 30 mg via INTRAVENOUS
  Administered 2019-11-18: 2.7 mg via INTRAVENOUS
  Administered 2019-11-18: 2.4 mg via INTRAVENOUS
  Administered 2019-11-18: 2.7 mg via INTRAVENOUS
  Administered 2019-11-18: 3 mg via INTRAVENOUS
  Administered 2019-11-18 (×2): 2.4 mg via INTRAVENOUS
  Administered 2019-11-19: 0.3 mg via INTRAVENOUS
  Administered 2019-11-19: 0 mg via INTRAVENOUS
  Administered 2019-11-19: 1.5 mg via INTRAVENOUS
  Administered 2019-11-19: 0 mg via INTRAVENOUS
  Administered 2019-11-19: 0.3 mg via INTRAVENOUS
  Administered 2019-11-19: 1.2 mg via INTRAVENOUS
  Administered 2019-11-20: 30 mg via INTRAVENOUS
  Administered 2019-11-20: 2.7 mg via INTRAVENOUS
  Administered 2019-11-20: 2.1 mg via INTRAVENOUS
  Administered 2019-11-20: 1.2 mg via INTRAVENOUS
  Administered 2019-11-20: 0.9 mg via INTRAVENOUS
  Administered 2019-11-20: 3 mg via INTRAVENOUS
  Administered 2019-11-21: 3.3 mg via INTRAVENOUS
  Administered 2019-11-21: 3 mg via INTRAVENOUS
  Administered 2019-11-21: 2.4 mg via INTRAVENOUS
  Filled 2019-11-17 (×2): qty 30

## 2019-11-17 NOTE — Progress Notes (Signed)
Postprocedural intraabdominal abscess  Subjective: Pain better with pca until he had a BM then it worsened again  Lidocaine patch not working well Objective: Vital signs in last 24 hours: Temp:  [98.1 F (36.7 C)-98.9 F (37.2 C)] 98.3 F (36.8 C) (06/07 0160) Pulse Rate:  [67-91] 79 (06/07 0633) Resp:  [16-22] 17 (06/07 0633) BP: (120-144)/(66-89) 120/66 (06/07 0633) SpO2:  [93 %-100 %] 99 % (06/07 0633) Last BM Date: 11/16/19  Intake/Output from previous day: 06/06 0701 - 06/07 0700 In: 2066.7 [P.O.:180; I.V.:1876.7] Out: 490 [Urine:475; Drains:15] Intake/Output this shift: No intake/output data recorded.  General appearance: alert and cooperative GI: normal findings: soft, distended, appropriately tender Incision/Wound: clean, dry, intact Drains with purulent output TG drain with stool  Lab Results:  Results for orders placed or performed during the hospital encounter of 10/31/19 (from the past 24 hour(s))  CBC     Status: Abnormal   Collection Time: 11/16/19  9:32 AM  Result Value Ref Range   WBC 9.2 4.0 - 10.5 K/uL   RBC 3.62 (L) 4.22 - 5.81 MIL/uL   Hemoglobin 10.7 (L) 13.0 - 17.0 g/dL   HCT 33.2 (L) 39.0 - 52.0 %   MCV 91.7 80.0 - 100.0 fL   MCH 29.6 26.0 - 34.0 pg   MCHC 32.2 30.0 - 36.0 g/dL   RDW 13.5 11.5 - 15.5 %   Platelets 612 (H) 150 - 400 K/uL   nRBC 0.0 0.0 - 0.2 %  Comprehensive metabolic panel     Status: Abnormal   Collection Time: 11/17/19  5:03 AM  Result Value Ref Range   Sodium 135 135 - 145 mmol/L   Potassium 4.3 3.5 - 5.1 mmol/L   Chloride 101 98 - 111 mmol/L   CO2 24 22 - 32 mmol/L   Glucose, Bld 111 (H) 70 - 99 mg/dL   BUN 23 (H) 6 - 20 mg/dL   Creatinine, Ser 0.59 (L) 0.61 - 1.24 mg/dL   Calcium 8.9 8.9 - 10.3 mg/dL   Total Protein 6.3 (L) 6.5 - 8.1 g/dL   Albumin 2.7 (L) 3.5 - 5.0 g/dL   AST 16 15 - 41 U/L   ALT 18 0 - 44 U/L   Alkaline Phosphatase 123 38 - 126 U/L   Total Bilirubin 0.4 0.3 - 1.2 mg/dL   GFR calc non Af Amer  >60 >60 mL/min   GFR calc Af Amer >60 >60 mL/min   Anion gap 10 5 - 15  Magnesium     Status: None   Collection Time: 11/17/19  5:03 AM  Result Value Ref Range   Magnesium 1.8 1.7 - 2.4 mg/dL  Phosphorus     Status: None   Collection Time: 11/17/19  5:03 AM  Result Value Ref Range   Phosphorus 3.8 2.5 - 4.6 mg/dL  CBC     Status: Abnormal   Collection Time: 11/17/19  5:03 AM  Result Value Ref Range   WBC 5.4 4.0 - 10.5 K/uL   RBC 3.12 (L) 4.22 - 5.81 MIL/uL   Hemoglobin 9.2 (L) 13.0 - 17.0 g/dL   HCT 28.8 (L) 39.0 - 52.0 %   MCV 92.3 80.0 - 100.0 fL   MCH 29.5 26.0 - 34.0 pg   MCHC 31.9 30.0 - 36.0 g/dL   RDW 13.6 11.5 - 15.5 %   Platelets 449 (H) 150 - 400 K/uL   nRBC 0.0 0.0 - 0.2 %  Differential     Status: Abnormal   Collection  Time: 11/17/19  5:03 AM  Result Value Ref Range   Neutrophils Relative % 75 %   Neutro Abs 4.0 1.7 - 7.7 K/uL   Lymphocytes Relative 10 %   Lymphs Abs 0.5 (L) 0.7 - 4.0 K/uL   Monocytes Relative 11 %   Monocytes Absolute 0.6 0.1 - 1.0 K/uL   Eosinophils Relative 3 %   Eosinophils Absolute 0.2 0.0 - 0.5 K/uL   Basophils Relative 1 %   Basophils Absolute 0.0 0.0 - 0.1 K/uL   Immature Granulocytes 0 %   Abs Immature Granulocytes 0.02 0.00 - 0.07 K/uL  Triglycerides     Status: None   Collection Time: 11/17/19  5:03 AM  Result Value Ref Range   Triglycerides 93 <150 mg/dL  Prealbumin     Status: Abnormal   Collection Time: 11/17/19  5:03 AM  Result Value Ref Range   Prealbumin 13.8 (L) 18 - 38 mg/dL  Glucose, capillary     Status: Abnormal   Collection Time: 11/17/19  7:35 AM  Result Value Ref Range   Glucose-Capillary 104 (H) 70 - 99 mg/dL     Studies/Results Radiology: CT shows improving fluid collections, possible SBO     MEDS, Scheduled . acetaminophen  500 mg Oral TID WC & HS  . Chlorhexidine Gluconate Cloth  6 each Topical Daily  . enoxaparin (LOVENOX) injection  40 mg Subcutaneous Q24H  . gabapentin  600 mg Oral TID  .  HYDROmorphone   Intravenous Q4H  . ketorolac  30 mg Intravenous Q8H  . lip balm  1 application Topical BID  . metoprolol tartrate  12.5 mg Oral BID  . sodium chloride flush  5 mL Intracatheter Q8H     Assessment: Postprocedural intraabdominal abscess Drains: per IR  Plan: NPO, ok for sips, chips, gum, hard candy and PO meds Cont PICC and TPN due to intolerance of PO.  Anticipate he will need TPN at home as well.  Currently cycled to TPN at night Can try PO pain meds as tolerated Cont Toradol for now Will cont PCA for better pain control Ambulate in hall ID: completed course of Zosyn and Eraxis.  Wbc wnl Ok to shower if drains are covered  Drain site pain: cont more frequent flushing, appreciate any help by IR team with this as pain control is mostly what's keeping him here at this point Will most likely need OR fecal diversion once abd inflammation better (early July)   LOS: 16 days    Rosario Adie, MD [facility], [place]    11/17/2019 8:15 AM

## 2019-11-17 NOTE — Progress Notes (Signed)
PHARMACY - TOTAL PARENTERAL NUTRITION CONSULT NOTE   Indication: prolonged PO intolerance, bowel rest  Patient Measurements: Height: 6' (182.9 cm) Weight: 72.6 kg (160 lb) IBW/kg (Calculated) : 77.6 TPN AdjBW (KG): 72.6 Body mass index is 21.7 kg/m. Usual Weight: 78-82 kg prior to multiple hospital admissions/surgeries  Assessment: 44 yoM with colorectal cancer who underwent neoadjuvant chemo/rads followed by low anterior resection 0/1/02, complicated by breakdown of the rectal anastomosis with multiple associated intra-abdominal abscesses. Readmitted 5/21 and currently undergoing conservative management with drain placement/antibiotics without repair of anastomotic leak. Inconsistent tolerance of diet over past 6 days and Pharmacy now consulted to dose TPN.  Glucose / Insulin: no Hx DM; excellent CBG control even with cycling.  CBGs at goal <150 (100 - 126) CBG 126 & 128 during TPN infusion Electrolytes: stable WNL (6/7) Renal: SCr, bicarb stable wnl; BUN rising, slightly elevated at 23  (6/7); UOP 475 mL (6/6) Hepatic: alk phos now WNL; Tbili, other enzymes stable WNL; albumin low   (6/7) Prealbumin / TG: Prealbumin low; TG stable WNL (5/31, 6/7) I/O: D5-LR @ KVO GI Imaging: - 5/21 CT A/P: anastomotic breakdown at rectal anastomosis w/ extraluminal stool; intra-abdominal abscess, SBO - 5/27 CT A/P: all abscesses reduced in size; concern for ileus or possibly distal SBO Surgeries / Procedures: - 5/5 LAR (see above) - 5/22 CT-guided drain placement x 3 (2 in RLQ, 1 transgluteal): purulent fluid +/- fecal material removed from all drains - RLQ drain removed 5/28 - 6/1 transgluteal drain exchanged d/t cellulitis at drain site & clogging d/t fecal material  Central access: PICC  TPN start date: 5/28   Nutritional Goals  RD recs 5/28, 6/3: Kcal:  2200-2400, Protein:  105-120g, Fluid:  2.2L/day  -Changed to cyclic TPN on 7/25- current 12h cycle provides: 2151 kcal & 119 g  protein  Current Nutrition:  NPO and TPN  Plan:   Continue goal cyclic TPN; continue cycle to 2160 ml given over 12 hr (max glucose infusion rate 6.3 after maxing out protein and reducing dextrose slightly)  Electrolytes in TPN: no changes: Na 4mq/L, K 562m/L, Ca 29m729mL, Mg 29mE49m, Phos 129mm51m; Cl:Ac ratio 1:1  Add standard MVI and trace elements to TPN  D/C CBGs (mid-infusion CBG 128 and 126)   Continue MIVF at K[facility]  [facility] on Mon/Thurs   NikolRoyetta AsalrmD, BCPS 11/17/2019 9:26 AM

## 2019-11-18 LAB — GLUCOSE, CAPILLARY: Glucose-Capillary: 108 mg/dL — ABNORMAL HIGH (ref 70–99)

## 2019-11-18 MED ORDER — TRAVASOL 10 % IV SOLN
INTRAVENOUS | Status: AC
  Filled 2019-11-18: qty 1188

## 2019-11-18 NOTE — Progress Notes (Signed)
Patient called and stated that he made a mess, upon assessment patient was covered with feces all over his body and found out it is leaking from his right transgluteal drain. Patient drain was cleaned and changed. Complains of severe pain on that area PRN pain meds given and we will continue to monitor .

## 2019-11-18 NOTE — Progress Notes (Signed)
PHARMACY - TOTAL PARENTERAL NUTRITION CONSULT NOTE   Indication: prolonged po intolerance, bowel rest  Patient Measurements: Height: 6' (182.9 cm) Weight: 72.6 kg (160 lb) IBW/kg (Calculated) : 77.6 TPN AdjBW (KG): 72.6 Body mass index is 21.7 kg/m. Usual Weight: 78-82 kg prior to multiple hospital admissions/surgeries  Assessment: 44 yo male with colorectal cancer who underwent neoadjuvant chemo/radiation followed by low anterior resection 10/15/19 which was found to be complicated by breakdown of the rectal anastomosis with multiple associated intra-abdominal abscesses after patient presented on 10/31/2019 with abdominal pain. Pharmacy consulted to start TPN after inconsistent tolerance of diet. RLQ and R buttock drain in place.    Glucose / Insulin: No hx of DM. CBGs previously controlled on cycling and not obtained yesterday.  Electrolytes: Obtained on 6/7. Na 135 - trending down. CL 103 - wnl but slowly trending down.  Corrected Ca 9.94. Mg 1.8 - wnl but trending down. Others wnl.  Renal: Scr stable.  LFTs / TGs: AST/ALT wnl. TG 93. Tbili wnl.  Prealbumin / albumin: prealbumin 13.8 - decrease from 15 on 5/31. Albumin 2.7  Intake / Output; MIVF: D5LR @ KVO. 11mL/24 hrs output RLQ drain. 49mL/24 hrs output buttock GI Imaging: - 5/21 CT A/P: anastomotic breakdown at rectal anastomosis w/ extraluminal stool; intra-abdominal abscess, SBO - 5/27 CT A/P: all abscesses reduced in size; concern for ileus or possibly distal SBO Surgeries / Procedures:  - 5/5 LAR (see above) - 5/22 CT-guided drain placement x 3 (2 in RLQ, 1 transgluteal): purulent fluid +/- fecal material removed from all drains - RLQ drain removed 5/28 - 6/1 transgluteal drain exchanged d/t cellulitis at drain site & clogging d/t fecal material  Central access: PICC TPN start date: 5/28  Nutritional Goals (per RD recommendation on 6/3): kCal: 2200-2400, Protein: 105-120, Fluid: 2.2L Goal TPN volume of 2160 mL (provides 119  g of protein and 2151 kcals per day)  Current Nutrition:  NPO  TPN  Plan:  Continue goal cyclic TPN of [id number] over 12 hours (max glucose infusion rate 6.3) Electrolytes in TPN: increase Na to 14mEq/L of Na Continue electrolytes in TPN: 55 mEq/L K, 5 mEq/L Ca, 5 mEq/L Mg, 15 mmol/L Phos. Cl:Ac ratio 1:1 Add standard MVI and trace elements to TPN Continue off of CBG monitoring given tolerated previously  Continue M[facility]F at [facility] Monitor TPN labs on Mon/Thurs Check prealbumin next on Monday to assess trend though at maximum protein and slightly under kcal goal   Cristela Felt, PharmD PGY1 Pharmacy Resident [employer]: [phone]  11/18/2019,9:37 AM

## 2019-11-18 NOTE — Progress Notes (Signed)
Postprocedural intraabdominal abscess  Subjective: Mainly having pain at TG site Objective: Vital signs in last 24 hours: Temp:  [98.2 F (36.8 C)-99 F (37.2 C)] 99 F (37.2 C) (06/07 2206) Pulse Rate:  [69-81] 75 (06/07 2206) Resp:  [14-20] 20 (06/08 0448) BP: (119-133)/(80-93) 133/93 (06/07 2206) SpO2:  [95 %-100 %] 98 % (06/08 0448) Last BM Date: 11/17/19  Intake/Output from previous day: 06/07 0701 - 06/08 0700 In: 3101.4 [P.O.:300; I.V.:2771.4] Out: 1115 [Urine:1100; Drains:15] Intake/Output this shift: No intake/output data recorded.  General appearance: alert and cooperative GI: normal findings: soft, distended, appropriately tender Incision/Wound: clean, dry, intact Drains with purulent output TG drain with stool  Lab Results:  No results found for this or any previous visit (from the past 24 hour(s)).   Studies/Results Radiology: CT shows improving fluid collections, SBO     MEDS, Scheduled . acetaminophen  500 mg Oral TID WC & HS  . Chlorhexidine Gluconate Cloth  6 each Topical Daily  . enoxaparin (LOVENOX) injection  40 mg Subcutaneous Q24H  . gabapentin  600 mg Oral TID  . HYDROmorphone   Intravenous Q4H  . ketorolac  30 mg Intravenous Q8H  . lip balm  1 application Topical BID  . metoprolol tartrate  12.5 mg Oral BID  . sodium chloride flush  5 mL Intracatheter Q8H     Assessment: Postprocedural intraabdominal abscess Drains: per IR  Plan: NPO, ok for sips, chips, gum, hard candy and PO meds Cont PICC and TPN due to intolerance of PO.  Anticipate he will need TPN at home as well.  Currently cycled to TPN at night Can try PO pain meds as tolerated Cont Toradol for now Will cont PCA for better pain control Ambulate in hall ID: completed course of Zosyn and Eraxis.  Wbc wnl Ok to shower if drains are covered  Drain site pain: cont frequent flushing, appreciate any help by IR team with this as pain control is the main issue right now Will  most likely need OR fecal diversion once abd inflammation better (late June or early July)   LOS: 35 days    Rosario Adie, MD [facility] Surgery, [place]    11/18/2019 8:18 AM

## 2019-11-18 NOTE — Progress Notes (Signed)
Referring Physician(s): Gross, Remo Lipps ([place])  Supervising Physician: Annamaria Boots  Patient Status:  [facility] - In-pt  Chief Complaint: "Blow out"  Subjective:  History of CRC s/p robotic assisted lower anterior resection in OR 10/15/2019 by Dr. [name]; with subsequent development of multiple intra-abdominal/pelvic fluid collections s/p RLQ drain placements x 2 along with right TG drain placement in IR 11/01/2019 by Dr. Anselm Pancoast; s/p removal of 1 of the RLQ drains 11/07/2019; s/p exchange of right TG drain 11/11/2019 in IR by Dr. Laurence Ferrari (patient currently has 2 IR drains- 1 RLQ and 1 right TG). Patient laying in bed resting comfortably, CPAP in place. He responds to voice and answers questions appropriately. Still having significant stool output coming up around drain at skin site despite flushing.   Allergies: Patient has no known allergies.  Medications:  Current Facility-Administered Medications:  .  0.9 %  sodium chloride infusion, , Intravenous, PRN, Leighton Ruff, MD, Last Rate: 10 mL/hr at 11/07/19 2200, Rate Verify at 11/07/19 2200 .  acetaminophen (TYLENOL) tablet 500 mg, 500 mg, Oral, TID WC & HS, Michael Boston, MD, 500 mg at 11/18/19 1148 .  alum & mag hydroxide-simeth (MAALOX/MYLANTA) 200-200-20 MG/5ML suspension 30 mL, 30 mL, Oral, Q6H PRN, Michael Boston, MD, 30 mL at 11/16/19 2303 .  Chlorhexidine Gluconate Cloth 2 % PADS 6 each, 6 each, Topical, Daily, Michael Boston, MD, 6 each at 11/18/19 1027 .  dextrose 5 % in lactated ringers infusion, , Intravenous, Continuous, Leighton Ruff, MD, Last Rate: 10 mL/hr at 11/17/19 2148, New Bag at 11/17/19 2148 .  diazepam (VALIUM) tablet 2 mg, 2 mg, Oral, D3O PRN, Leighton Ruff, MD, 2 mg at 67/12/45 0039 .  diphenhydrAMINE (BENADRYL) injection 12.5-25 mg, 12.5-25 mg, Intravenous, Q6H PRN, Michael Boston, MD .  enalaprilat (VASOTEC) injection 0.625-1.25 mg, 0.625-1.25 mg, Intravenous, Q6H PRN, Michael Boston, MD .  enoxaparin (LOVENOX) injection 40  mg, 40 mg, Subcutaneous, Q24H, Michael Boston, MD, 40 mg at 11/18/19 1026 .  gabapentin (NEURONTIN) capsule 600 mg, 600 mg, Oral, TID, Michael Boston, MD, 600 mg at 11/18/19 1026 .  guaiFENesin-dextromethorphan (ROBITUSSIN DM) 100-10 MG/5ML syrup 10 mL, 10 mL, Oral, Q4H PRN, Michael Boston, MD .  hydrocortisone (ANUSOL-HC) 2.5 % rectal cream 1 application, 1 application, Topical, QID PRN, Michael Boston, MD .  hydrocortisone cream 1 % 1 application, 1 application, Topical, TID PRN, Michael Boston, MD .  HYDROmorphone (DILAUDID) 1 mg/mL PCA injection, , Intravenous, Y0D, Leighton Ruff, MD, 30 mg at 98/33/82 2001 .  ketorolac (TORADOL) 30 MG/ML injection 30 mg, 30 mg, Intravenous, N0N, Leighton Ruff, MD, 30 mg at 39/76/73 0855 .  lip balm (CARMEX) ointment 1 application, 1 application, Topical, BID, Michael Boston, MD, 1 application at 41/93/79 1027 .  magic mouthwash, 15 mL, Oral, QID PRN, Michael Boston, MD .  menthol-cetylpyridinium (CEPACOL) lozenge 3 mg, 1 lozenge, Oral, PRN, Michael Boston, MD .  metoprolol tartrate (LOPRESSOR) injection 5 mg, 5 mg, Intravenous, Q6H PRN, Michael Boston, MD .  metoprolol tartrate (LOPRESSOR) tablet 12.5 mg, 12.5 mg, Oral, BID, Gross, Remo Lipps, MD, 12.5 mg at 11/18/19 1026 .  naloxone (NARCAN) injection 0.4 mg, 0.4 mg, Intravenous, PRN **AND** sodium chloride flush (NS) 0.9 % injection 9 mL, 9 mL, Intravenous, PRN, Leighton Ruff, MD .  ondansetron [facility]) injection 4 mg, 4 mg, Intravenous, Q6H PRN **OR** ondansetron (ZOFRAN) 8 mg in sodium chloride 0.9 % 50 mL IVPB, 8 mg, Intravenous, Q6H PRN, Michael Boston, MD .  oxyCODONE (Oxy IR/ROXICODONE) immediate release tablet  5-10 mg, 5-10 mg, Oral, U9W PRN, Leighton Ruff, MD, 10 mg at 11/91/47 0446 .  phenol (CHLORASEPTIC) mouth spray 1-2 spray, 1-2 spray, Mouth/Throat, PRN, Michael Boston, MD .  prochlorperazine (COMPAZINE) injection 5-10 mg, 5-10 mg, Intravenous, Q4H PRN, Michael Boston, MD .  simethicone (MYLICON) 40 WG/9.5AO  suspension 40 mg, 40 mg, Oral, QID PRN, Michael Boston, MD .  sodium chloride flush (NS) 0.9 % injection 10-40 mL, 10-40 mL, Intracatheter, PRN, Leighton Ruff, MD .  sodium chloride flush (NS) 0.9 % injection 5 mL, 5 mL, Intracatheter, Q8H, Markus Daft, MD, 5 mL at 11/18/19 0520 .  TPN CYCLIC-ADULT (ION), , Intravenous, Cyclic-See Admin Instructions, Henri Medal, RPH    Vital Signs: BP 132/88 (BP Location: Left Arm)   Pulse 77   Temp 98.9 F (37.2 C) (Oral)   Resp 16   Ht 6' (1.829 m)   Wt 72.6 kg   SpO2 99%   BMI 21.70 kg/m   Physical Exam Vitals and nursing note reviewed.  Constitutional:      General: He is not in acute distress.    Appearance: Normal appearance.  Pulmonary:     Effort: Pulmonary effort is normal. No respiratory distress.  Abdominal:     Comments: RLQ drain site without tenderness, erythema, drainage, or active bleeding; clear yellow fluid in bag.  Right TG drain site with mild tenderness and surrounding induration and erythema, feculent drainage up around drain coming from skin site. bandage/statlock with feculent output  drain flushes/aspirates without resistance, with drainage into bag following flushing.  Skin:    General: Skin is warm and dry.  Neurological:     Mental Status: He is alert and oriented to person, place, and time.     Imaging: CT ABDOMEN PELVIS W CONTRAST  Result Date: 11/16/2019 CLINICAL DATA:  Intra-abdominal abscess, status post drainage x3 11/01/2019, revision of transgluteal catheter 11/11/2019, persistent pain EXAM: CT ABDOMEN AND PELVIS WITH CONTRAST TECHNIQUE: Multidetector CT imaging of the abdomen and pelvis was performed using the standard protocol following bolus administration of intravenous contrast. CONTRAST:  138mL OMNIPAQUE IOHEXOL 300 MG/ML  SOLN COMPARISON:  11/06/2019 and previous FINDINGS: Lower chest: Linear scarring or atelectasis at the right lung base as before. For no pleural or pericardial effusion.  Hepatobiliary: Subcapsular hemangioma in segment 6/7 as before. 1 cm low-attenuation lesion in segment 7 is stable since 02/04/2019. No new lesion. No biliary ductal dilatation. Gallbladder unremarkable. Pancreas: Unremarkable. No pancreatic ductal dilatation or surrounding inflammatory changes. Spleen: Normal in size without focal abnormality. Adrenals/Urinary Tract: Adrenal glands unremarkable. Kidneys enhance normally without focal lesion or hydronephrosis. Thick-walled urinary bladder. Stomach/Bowel: Stomach is decompressed. Duodenum and proximal small bowel decompressed. Multiple dilated mid and distal small bowel loops. Transition region in the right lower quadrant. The terminal ileum is nondilated. The colon is nondilated. Anastomotic breakdown at the rectum with slight decrease in size of complex surrounding loculated perirectal gas and fluid collection 6.2 x 5.1 cm, with good positioning of right transgluteal drain catheter. Vascular/Lymphatic: No abdominal or pelvic adenopathy. Subcentimeter left para-aortic and aortocaval nodes noted. Reproductive: Prostate enlargement, protruding into the urinary bladder. Other: Right lower quadrant drain catheter stable. Interval decrease in size of surrounding gas and fluid collection 5.3 x 1.7 cm. No new or undrained fluid collections identified. No ascites. No free air. Musculoskeletal: No acute or significant osseous findings. IMPRESSION: 1. Rectal anastomotic breakdown with decrease in size of complex surrounding perirectal gas and fluid collection, good positioning of transgluteal drain catheter.  2. Persistent small bowel obstruction. Transitional region near the right lower quadrant abscess which is decreasing in size with well-positioned percutaneous drain catheter. Electronically Signed   By: Lucrezia Europe M.D.   On: 11/16/2019 08:19    Labs:  CBC: Recent Labs    11/08/19 0434 11/10/19 0300 11/16/19 0932 11/17/19 0503  WBC 9.0 7.0 9.2 5.4  HGB 9.2*  9.9* 10.7* 9.2*  HCT 28.9* 31.1* 33.2* 28.8*  PLT 480* 492* 612* 449*    COAGS: Recent Labs    11/01/19 0731  INR 1.2    BMP: Recent Labs    11/10/19 0300 11/11/19 0340 11/13/19 0327 11/17/19 0503  NA 140 137 137 135  K 4.6 4.3 4.4 4.3  CL 105 103 103 101  CO2 25 24 25 24   GLUCOSE 116* 130* 121* 111*  BUN 12 15 19  23*  CALCIUM 8.9 8.8* 9.0 8.9  CREATININE 0.82 0.68 0.60* 0.59*  GFRNONAA >60 >60 >60 >60  GFRAA >60 >60 >60 >60    LIVER FUNCTION TESTS: Recent Labs    11/10/19 0300 11/11/19 0340 11/13/19 0327 11/17/19 0503  BILITOT 0.5 0.5 0.5 0.4  AST 16 15 18 16   ALT 24 22 20 18   ALKPHOS 188* 163* 155* 123  PROT 6.7 6.6 6.8 6.3*  ALBUMIN 2.7* 2.7* 2.9* 2.7*    Assessment and Plan:  History of CRC s/p robotic assisted lower anterior resection in OR 10/15/2019 by Dr. [name]; with subsequent development of multiple intra-abdominal/pelvic fluid collections s/p RLQ drain placements x 2 along with right TG drain placement in IR 11/01/2019 by Dr. Anselm Pancoast; s/p removal of 1 of the RLQ drains 11/07/2019; s/p exchange of right TG drain 11/11/2019 in IR by Dr. Laurence Ferrari (patient currently has 2 IR drains- 1 RLQ and 1 right TG). RLQ drain stable with approximately10 cc clear yellow fluid in suction bulb. Right TG drain stable with approximately25 cc feculent output in gravity bag.  Drain currently functioning well but unfortunately feculent drainage easily clogs tube despite frequent flushes and drains up and out skin tract.  Discussed case with Dr. Laurence Ferrari again, who states drain is in good position based on CT and is functioning well, therefore recommends continuing current drain management. Would NOT recommend removing drain and/or placing new drain via left side as this would only create a second drain site and fistula tract, which is unlikely to seal up given that drain has been there for a few weeks, tract mature. Drain upsizing to promote better output and less clogging is  potentially an option though this also would just create a biger hole/tract for the stool to come out from. Options are very limited and unfortunately diversion may be his only option  Further plans perCCS- appreciate and agree with management. IR to follow.   Electronically Signed: Ascencion Dike, PA-C 11/18/2019, 12:37 PM   I spent a total of 25 Minutes at the the patient's bedside AND on the patient's hospital floor or unit, greater than 50% of which was counseling/coordinating care for intraabdominal abscesses x3 s/p drain placements x3.

## 2019-11-19 LAB — GLUCOSE, CAPILLARY: Glucose-Capillary: 125 mg/dL — ABNORMAL HIGH (ref 70–99)

## 2019-11-19 MED ORDER — TAMSULOSIN HCL 0.4 MG PO CAPS
0.4000 mg | ORAL_CAPSULE | Freq: Every day | ORAL | Status: DC
  Administered 2019-11-19: 0.4 mg via ORAL
  Filled 2019-11-19: qty 1

## 2019-11-19 MED ORDER — TRAVASOL 10 % IV SOLN
INTRAVENOUS | Status: AC
  Filled 2019-11-19: qty 1188

## 2019-11-19 NOTE — Progress Notes (Signed)
Postprocedural intraabdominal abscess  Subjective: Pt feels better today.  Trying to wean off of the PCA and use oxycodone instead.  C/o pain in urethra during the end of urination Objective: Vital signs in last 24 hours: Temp:  [97.7 F (36.5 C)-101 F (38.3 C)] 98.7 F (37.1 C) (06/09 1345) Pulse Rate:  [70-97] 71 (06/09 1345) Resp:  [16-20] 17 (06/09 0915) BP: (125-134)/(83-94) 125/83 (06/09 1345) SpO2:  [93 %-100 %] 97 % (06/09 1345) Last BM Date: 11/19/19  Intake/Output from previous day: 06/08 0701 - 06/09 0700 In: 3589.3 [P.O.:840; I.V.:2679.3] Out: 1920 [Urine:1900; Drains:20] Intake/Output this shift: Total I/O In: 163.9 [P.O.:60; I.V.:88.9; Other:15] Out: 0   General appearance: alert and cooperative GI: normal findings: soft, distended, appropriately tender Incision/Wound: clean, dry, intact Drains with purulent output TG drain with stool  Lab Results:  Results for orders placed or performed during the hospital encounter of 10/31/19 (from the past 24 hour(s))  Glucose, capillary     Status: Abnormal   Collection Time: 11/19/19 11:26 AM  Result Value Ref Range   Glucose-Capillary 125 (H) 70 - 99 mg/dL   Studies/Results Radiology: CT shows improving fluid collections, SBO     MEDS, Scheduled . acetaminophen  500 mg Oral TID WC & HS  . Chlorhexidine Gluconate Cloth  6 each Topical Daily  . enoxaparin (LOVENOX) injection  40 mg Subcutaneous Q24H  . gabapentin  600 mg Oral TID  . HYDROmorphone   Intravenous Q4H  . lip balm  1 application Topical BID  . metoprolol tartrate  12.5 mg Oral BID  . sodium chloride flush  5 mL Intracatheter Q8H  . tamsulosin  0.4 mg Oral QPC supper     Assessment: Postprocedural intraabdominal abscess Drains: per IR  Plan: NPO, ok for sips, chips, gum, hard candy and PO meds Cont PICC and TPN due to intolerance of PO.  Anticipate he will need TPN at home as well.  Currently cycled to TPN at night Cont to try PO pain meds  as tolerated Cont Toradol for now Will cont PCA.  Agree to with trying to wean off of this Ambulate in hall ID: completed course of Zosyn and Eraxis.  Wbc wnl Ok to shower if drains are covered  Drain site pain: cont frequent flushing, appreciate any help by IR team with this as pain control is the main issue right now Will most likely need OR fecal diversion once abd inflammation better (late June or early July)   LOS: 2 days    Rosario Adie, MD [facility] [place]    11/19/2019 1:47 PM

## 2019-11-19 NOTE — Progress Notes (Signed)
NT reports about patient's Temperature of 101 F , patient reported he felt a" pop" on his  Right TG drain , upon assessment dressing is saturated with feces, dressing has been changed give PRN pain medicine and ice packs and wet  wash cloth was applied to the patient. We will continue to monitor.

## 2019-11-19 NOTE — Progress Notes (Signed)
PHARMACY - TOTAL PARENTERAL NUTRITION CONSULT NOTE   Indication: prolonged po intolerance, bowel rest  Patient Measurements: Height: 6' (182.9 cm) Weight: 72.6 kg (160 lb) IBW/kg (Calculated) : 77.6 TPN AdjBW (KG): 72.6 Body mass index is 21.7 kg/m. Usual Weight: 78-82 kg prior to multiple hospital admissions/surgeries  Assessment: 44 yo male with colorectal cancer who underwent neoadjuvant chemo/radiation followed by low anterior resection 10/15/19 which was found to be complicated by breakdown of the rectal anastomosis with multiple associated intra-abdominal abscesses after patient presented on 10/31/2019 with abdominal pain. Pharmacy consulted to start TPN after inconsistent tolerance of diet. RLQ and R buttock drain in place.    Glucose / Insulin: No hx of DM. CBGs previously controlled on cycling and stable  Electrolytes: Obtained on 6/7. Na 135 - trending down. CL 103 - wnl but slowly trending down.  Corrected Ca 9.94. Mg 1.8 - wnl but trending down. Others wnl.  Renal: Scr stable.  LFTs / TGs: AST/ALT wnl. TG 93. Tbili wnl.  Prealbumin / albumin: prealbumin 13.8 - decrease from 15 on 5/31. Albumin 2.7  Intake / Output; MIVF: D5LR @ KVO. 1mL/24 hrs output RLQ drain. 53mL/24 hrs output buttock GI Imaging: - 5/21 CT A/P: anastomotic breakdown at rectal anastomosis w/ extraluminal stool; intra-abdominal abscess, SBO - 5/27 CT A/P: all abscesses reduced in size; concern for ileus or possibly distal SBO Surgeries / Procedures:  - 5/5 LAR (see above) - 5/22 CT-guided drain placement x 3 (2 in RLQ, 1 transgluteal): purulent fluid +/- fecal material removed from all drains - RLQ drain removed 5/28 - 6/1 transgluteal drain exchanged d/t cellulitis at drain site & clogging d/t fecal material  Central access: PICC TPN start date: 5/28  Nutritional Goals (per RD recommendation on 6/3): kCal: 2200-2400, Protein: 105-120, Fluid: 2.2L Goal TPN volume of 2160 mL (provides 119 g of protein and  2151 kcals per day)  Current Nutrition:  NPO  TPN  Plan:  Continue goal cyclic TPN of 0601VI over 12 hours (max glucose infusion rate 6.3) Electrolytes in TPN: Na increased to 61mEq/L on 6/8  Continue electrolytes in TPN: 55 mEq/L K, 5 mEq/L Ca, 5 mEq/L Mg, 15 mmol/L Phos. Cl:Ac ratio 1:1 Add standard MVI and trace elements to TPN Continue off of CBG monitoring given tolerated previously  Continue M[facility]F at [facility] Monitor TPN labs on Mon/Thurs Check prealbumin next on Monday to assess trend though at maximum protein and slightly under kcal goal  Albertina Parr, PharmD., BCPS, BCCCP Clinical Pharmacist Clinical phone for 11/19/19 until 3:30pm: [phone] If after 3:30pm, please refer to [facility] for unit-specific pharmacist

## 2019-11-19 NOTE — Progress Notes (Signed)
Nutrition Follow-up  INTERVENTION:   -Cyclic TPN per Pharmacy -Recommend new weight for admission (last recorded 5/21)  NUTRITION DIAGNOSIS:   Increased nutrient needs related to chronic illness, cancer and cancer related treatments as evidenced by estimated needs.  Ongoing.  GOAL:   Patient will meet greater than or equal to 90% of their needs  Meeting with TPN.  MONITOR:   Labs, Weight trends, I & O's, Skin(TPN)  ASSESSMENT:   44 year old male who underwent robotic low anterior resection on Oct 15, 4130 by Dr. Leighton Ruff for an adenocarcinoma of the rectum.  Patient was discharged home on Oct 21, 2019.  Since discharge the patient has had persistent lower abdominal pain.  5/22: s/p CT guided drainage of percutaneous catheter 5/23: pt on clear liquid diet 5/24: diet was advanced to fulls 5/28: NPO, TPN initiation 6/1: s/p Rt TG drain exchange  Patient continues to receive cyclic TPN, providing total of 2151 kcals and 118g protein daily. Diet has remained NPO given intolerance to PO. Per surgery note, pt to discharge on home TPN.  Admission weight: 160 lbs. No new weights for this admission.  I/Os: +6.8L since 5/26 UOP: 1900 ml x 24 hrs  Medications reviewed. Labs reviewed: CBGs: 108-125  Diet Order:   Diet Order            Diet NPO time specified Except for: Ice Chips  Diet effective now              EDUCATION NEEDS:   No education needs have been identified at this time  Skin:  Skin Assessment: Skin Integrity Issues: Skin Integrity Issues:: Incisions Incisions: abdominal  Last BM:  6/9 -type 7  Height:   Ht Readings from Last 1 Encounters:  10/31/19 6' (1.829 m)    Weight:   Wt Readings from Last 1 Encounters:  10/31/19 72.6 kg   BMI:  Body mass index is 21.7 kg/m.  Estimated Nutritional Needs:   Kcal:  2200-2400  Protein:  105-120g  Fluid:  2.2L/day  Clayton Bibles, MS, RD, LDN Inpatient Clinical Dietitian Contact  information available via Amion b

## 2019-11-19 NOTE — Progress Notes (Signed)
RN was called by pharmacist about patient's TPN and at what rate it is was running, RN told pharmacist TPN was going at 192mls/hr, TPN rate changed was not charted on the [facility] by previous RN.

## 2019-11-20 LAB — COMPREHENSIVE METABOLIC PANEL
ALT: 16 U/L (ref 0–44)
AST: 14 U/L — ABNORMAL LOW (ref 15–41)
Albumin: 2.6 g/dL — ABNORMAL LOW (ref 3.5–5.0)
Alkaline Phosphatase: 120 U/L (ref 38–126)
Anion gap: 10 (ref 5–15)
BUN: 17 mg/dL (ref 6–20)
CO2: 23 mmol/L (ref 22–32)
Calcium: 8.7 mg/dL — ABNORMAL LOW (ref 8.9–10.3)
Chloride: 105 mmol/L (ref 98–111)
Creatinine, Ser: 0.52 mg/dL — ABNORMAL LOW (ref 0.61–1.24)
GFR calc Af Amer: >60 mL/min (ref >60)
GFR calc non Af Amer: >60 mL/min (ref >60)
Glucose, Bld: 135 mg/dL — ABNORMAL HIGH (ref 70–99)
Potassium: 4.1 mmol/L (ref 3.5–5.1)
Sodium: 138 mmol/L (ref 135–145)
Total Bilirubin: 0.5 mg/dL (ref 0.3–1.2)
Total Protein: 6.3 g/dL — ABNORMAL LOW (ref 6.5–8.1)

## 2019-11-20 LAB — GLUCOSE, CAPILLARY: Glucose-Capillary: 105 mg/dL — ABNORMAL HIGH (ref 70–99)

## 2019-11-20 LAB — PHOSPHORUS: Phosphorus: 2.7 mg/dL (ref 2.5–4.6)

## 2019-11-20 LAB — MAGNESIUM: Magnesium: 1.8 mg/dL (ref 1.7–2.4)

## 2019-11-20 MED ORDER — TRAVASOL 10 % IV SOLN
INTRAVENOUS | Status: AC
  Filled 2019-11-20: qty 1188

## 2019-11-20 MED ORDER — URELLE 81 MG PO TABS
1.0000 | ORAL_TABLET | Freq: Three times a day (TID) | ORAL | Status: DC
  Administered 2019-11-20 – 2019-11-29 (×23): 81 mg via ORAL
  Filled 2019-11-20 (×33): qty 1

## 2019-11-20 NOTE — Progress Notes (Signed)
Referring Physician(s): Gross, Remo Lipps ([place])  Supervising Physician: Aletta Edouard  Patient Status:  [facility] - In-pt  Chief Complaint: "Pain at drain"  Subjective:  History of CRC s/p robotic assisted lower anterior resection in OR 10/15/2019 by Dr. [name]; with subsequent development of multiple intra-abdominal/pelvic fluid collections s/p RLQ drain placements x 2 along with right TG drain placement in IR 11/01/2019 by Dr. Anselm Pancoast; s/p removal of 1 of the RLQ drains 11/07/2019; s/p exchange of right TG drain 11/11/2019 in IR by Dr. Laurence Ferrari (patient currently has 2 IR drains- 1 RLQ and 1 right TG). Patient awake and alert laying in bed. Complains of pain at right TG drain, rated 7/10 at this time. States he is trying to wean off PCA and use PO pain control instead. RLQ and right TG drain sites c/d/i.   Allergies: Patient has no known allergies.  Medications: Prior to Admission medications   Medication Sig Start Date End Date Taking? Authorizing Provider  acetaminophen (TYLENOL) 500 MG tablet Take 500 mg by mouth every 6 (six) hours as needed for moderate pain.   Yes [provider]  gabapentin (NEURONTIN) 300 MG capsule Take 2 capsules (600 mg total) by mouth 2 (two) times daily. Patient taking differently: Take 600 mg by mouth 2 (two) times daily as needed (pain).  09/04/19  Yes Owens Shark, NP  ibuprofen (ADVIL) 200 MG tablet Take 400 mg by mouth every 6 (six) hours as needed for moderate pain.   Yes [provider]  LORazepam (ATIVAN) 0.5 MG tablet Take 1 tablet (0.5 mg total) by mouth every 8 (eight) hours as needed for anxiety. Or nausea. Do not drive while taking 14/78/29  Yes Owens Shark, NP  oxyCODONE (OXY IR/ROXICODONE) 5 MG immediate release tablet Take 1-2 tablets (5-10 mg total) by mouth every 6 (six) hours as needed for moderate pain, severe pain or breakthrough pain. 5/62/13  Yes Leighton Ruff, MD  lidocaine-prilocaine (EMLA) cream Apply 1 application  topically as directed. Apply 1 hour prior to stick and cover with plastic wrap Patient not taking: Reported on 09/25/2019 07/10/19   Ladell Pier, MD     Vital Signs: BP 128/84 (BP Location: Left Arm)   Pulse 71   Temp 99.1 F (37.3 C) (Oral)   Resp 18   Ht 6' (1.829 m)   Wt 160 lb (72.6 kg)   SpO2 98%   BMI 21.70 kg/m   Physical Exam Vitals and nursing note reviewed.  Constitutional:      General: He is not in acute distress.    Appearance: Normal appearance.  Pulmonary:     Effort: Pulmonary effort is normal. No respiratory distress.  Abdominal:     Comments: RLQ drain site without tenderness, erythema, drainage, or active bleeding; approximately 10-20 cc clear yellow fluid in gravity bag. Right TG drain site with mild tenderness and surrounding erythema, no active drainage or active bleeding; approximately 50 cc feculent output in gravity bag.  Skin:    General: Skin is warm and dry.  Neurological:     Mental Status: He is alert and oriented to person, place, and time.     Imaging: No results found.  Labs:  CBC: Recent Labs    11/08/19 0434 11/10/19 0300 11/16/19 0932 11/17/19 0503  WBC 9.0 7.0 9.2 5.4  HGB 9.2* 9.9* 10.7* 9.2*  HCT 28.9* 31.1* 33.2* 28.8*  PLT 480* 492* 612* 449*    COAGS: Recent Labs    11/01/19 0731  INR 1.2    BMP: Recent Labs    11/11/19 0340 11/13/19 0327 11/17/19 0503 11/20/19 0425  NA 137 137 135 138  K 4.3 4.4 4.3 4.1  CL 103 103 101 105  CO2 24 25 24 23   GLUCOSE 130* 121* 111* 135*  BUN 15 19 23* 17  CALCIUM 8.8* 9.0 8.9 8.7*  CREATININE 0.68 0.60* 0.59* 0.52*  GFRNONAA >60 >60 >60 >60  GFRAA >60 >60 >60 >60    LIVER FUNCTION TESTS: Recent Labs    11/11/19 0340 11/13/19 0327 11/17/19 0503 11/20/19 0425  BILITOT 0.5 0.5 0.4 0.5  AST 15 18 16  14*  ALT 22 20 18 16   ALKPHOS 163* 155* 123 120  PROT 6.6 6.8 6.3* 6.3*  ALBUMIN 2.7* 2.9* 2.7* 2.6*    Assessment and Plan:  History of CRC s/p robotic  assisted lower anterior resection in OR 10/15/2019 by Dr. [name]; with subsequent development of multiple intra-abdominal/pelvic fluid collections s/p RLQ drain placements x 2 along with right TG drain placement in IR 11/01/2019 by Dr. Anselm Pancoast; s/p removal of 1 of the RLQ drains 11/07/2019; s/p exchange of right TG drain 11/11/2019 in IR by Dr. Laurence Ferrari (patient currently has 2 IR drains- 1 RLQ and 1 right TG). RLQ drain stable with approximately10-20 cc clear yellow fluid in gravity bag. Right TG drain stable with approximately50 cc feculent output in gravity bag. Continue current drain management- continue with Qshift flushes/monitor of output/maintainance to area to ensure c/d/i. Plan for repeat CT/possible drain injection when output <10 cc/day (assess for possible removal). Further plans perCCS- appreciate and agree with management. IR to follow.    Electronically Signed: Earley Abide, PA-C 11/20/2019, 10:48 AM   I spent a total of 25 Minutes at the the patient's bedside AND on the patient's hospital floor or unit, greater than 50% of which was counseling/coordinating care for intraabdominal abscesses x3 s/p drain placements x3.

## 2019-11-20 NOTE — Progress Notes (Signed)
PHARMACY - TOTAL PARENTERAL NUTRITION CONSULT NOTE   Indication: prolonged po intolerance, bowel rest  Patient Measurements: Height: 6' (182.9 cm) Weight: 72.6 kg (160 lb) IBW/kg (Calculated) : 77.6 TPN AdjBW (KG): 72.6 Body mass index is 21.7 kg/m. Usual Weight: 78-82 kg prior to multiple hospital admissions/surgeries  Assessment: 44 yo male with colorectal cancer who underwent neoadjuvant chemo/radiation followed by low anterior resection 10/15/19 which was found to be complicated by breakdown of the rectal anastomosis with multiple associated intra-abdominal abscesses after patient presented on 10/31/2019 with abdominal pain. Pharmacy consulted to start TPN after inconsistent tolerance of diet. RLQ and R buttock drain in place.    Glucose / Insulin: No hx of DM. CBGs previously controlled on cycling and stable  Electrolytes: Phos 2.7 - wnl but decrease. Mg 1.8 - wnl but low end of range. Corrected Ca 9.82. Other electrolytes wnl.  Renal: Scr stable.  LFTs / TGs: AST/ALT wnl. TG 93. Tbili wnl.  Prealbumin / albumin: prealbumin 13.8 (6/7) - decrease from 15 on 5/31. Albumin 2.6.  Intake / Output; MIVF: D5LR @ KVO. 70mL/24 hrs output RLQ drain. 47mL/24 hrs output buttock GI Imaging: - 5/21 CT A/P: anastomotic breakdown at rectal anastomosis w/ extraluminal stool; intra-abdominal abscess, SBO - 5/27 CT A/P: all abscesses reduced in size; concern for ileus or possibly distal SBO Surgeries / Procedures:  - 5/5 LAR (see above) - 5/22 CT-guided drain placement x 3 (2 in RLQ, 1 transgluteal): purulent fluid +/- fecal material removed from all drains - RLQ drain removed 5/28 - 6/1 transgluteal drain exchanged d/t cellulitis at drain site & clogging d/t fecal material  Central access: PICC TPN start date: 5/28  Nutritional Goals (per RD recommendation on 6/3): kCal: 2200-2400, Protein: 105-120, Fluid: 2.2L Goal TPN volume of 2160 mL (provides 119 g of protein and 2151 kcals per day)  Current  Nutrition:  NPO  TPN  Plan:  Continue goal cyclic TPN of 0071QR over 12 hours (max glucose infusion rate 6.3) Electrolytes in TPN: Phos increased to 20 mmol/L, Mg increased to 8 mEq/L Continue electrolytes in TPN:  75 mEq Na, 55 mEq/L K, 5 mEq/L Ca, Cl:Ac ratio 1:1 Add standard MVI and trace elements to TPN Continue off of CBG monitoring given tolerated previously  Continue MIVF at [facility] Monitor TPN labs on Mon/Thurs, recheck electrolytes on Saturday Check prealbumin next on Monday to assess trend though at maximum protein and slightly under kcal goal  Cristela Felt, PharmD PGY1 Pharmacy Resident [employer]: [phone]

## 2019-11-20 NOTE — Progress Notes (Signed)
Agree with this [employer] student documentation. 

## 2019-11-20 NOTE — Progress Notes (Signed)
Postprocedural intraabdominal abscess  Subjective: Had a rough night.  Trying to wean off of the PCA and use oxycodone instead.  C/o pain in urethra during the end of urination Objective: Vital signs in last 24 hours: Temp:  [98 F (36.7 C)-99 F (37.2 C)] 98.7 F (37.1 C) (06/10 0546) Pulse Rate:  [69-88] 69 (06/10 0546) Resp:  [16-19] 17 (06/10 0546) BP: (115-133)/(70-91) 115/70 (06/10 0546) SpO2:  [97 %-100 %] 98 % (06/10 0546) Last BM Date: 11/19/19  Intake/Output from previous day: 06/09 0701 - 06/10 0700 In: 2881.9 [P.O.:240; I.V.:2591.9] Out: 905 [Urine:900; Drains:5] Intake/Output this shift: No intake/output data recorded.  General appearance: alert and cooperative GI: normal findings: soft, distended, appropriately tender Incision/Wound: clean, dry, intact Drains with purulent output TG drain with stool  Lab Results:  Results for orders placed or performed during the hospital encounter of 10/31/19 (from the past 24 hour(s))  Glucose, capillary     Status: Abnormal   Collection Time: 11/19/19 11:26 AM  Result Value Ref Range   Glucose-Capillary 125 (H) 70 - 99 mg/dL  Comprehensive metabolic panel     Status: Abnormal   Collection Time: 11/20/19  4:25 AM  Result Value Ref Range   Sodium 138 135 - 145 mmol/L   Potassium 4.1 3.5 - 5.1 mmol/L   Chloride 105 98 - 111 mmol/L   CO2 23 22 - 32 mmol/L   Glucose, Bld 135 (H) 70 - 99 mg/dL   BUN 17 6 - 20 mg/dL   Creatinine, Ser 0.52 (L) 0.61 - 1.24 mg/dL   Calcium 8.7 (L) 8.9 - 10.3 mg/dL   Total Protein 6.3 (L) 6.5 - 8.1 g/dL   Albumin 2.6 (L) 3.5 - 5.0 g/dL   AST 14 (L) 15 - 41 U/L   ALT 16 0 - 44 U/L   Alkaline Phosphatase 120 38 - 126 U/L   Total Bilirubin 0.5 0.3 - 1.2 mg/dL   GFR calc non Af Amer >60 >60 mL/min   GFR calc Af Amer >60 >60 mL/min   Anion gap 10 5 - 15  Magnesium     Status: None   Collection Time: 11/20/19  4:25 AM  Result Value Ref Range   Magnesium 1.8 1.7 - 2.4 mg/dL  Phosphorus      Status: None   Collection Time: 11/20/19  4:25 AM  Result Value Ref Range   Phosphorus 2.7 2.5 - 4.6 mg/dL   Studies/Results Radiology: CT shows improving fluid collections, SBO     MEDS, Scheduled . acetaminophen  500 mg Oral TID WC & HS  . Chlorhexidine Gluconate Cloth  6 each Topical Daily  . enoxaparin (LOVENOX) injection  40 mg Subcutaneous Q24H  . gabapentin  600 mg Oral TID  . HYDROmorphone   Intravenous Q4H  . lip balm  1 application Topical BID  . metoprolol tartrate  12.5 mg Oral BID  . sodium chloride flush  5 mL Intracatheter Q8H  . Urelle  1 tablet Oral TID     Assessment: Postprocedural intraabdominal abscess Drains: per IR  Plan: NPO, ok for sips, chips, gum, hard candy and PO meds Cont PICC and TPN due to intolerance of PO.  Anticipate he will need TPN at home as well.  Currently cycled to TPN at night Cont to try PO pain meds as tolerated Will cont PCA.  Agree to with trying to wean off of this Ambulate in hall ID: completed course of Zosyn and Eraxis.  Wbc wnl Ok to  shower if drains are covered  Drain site pain: cont frequent flushing, appreciate any help by IR team with this as pain control is the main issue right now Will try urised for pain with urination Will most likely need OR fecal diversion once abd inflammation better (late June or early July)   LOS: 40 days    Rosario Adie, MD [facility], [place]    11/20/2019 8:34 AM

## 2019-11-21 LAB — CBC
HCT: 30.8 % — ABNORMAL LOW (ref 39.0–52.0)
Hemoglobin: 10 g/dL — ABNORMAL LOW (ref 13.0–17.0)
MCH: 29.3 pg (ref 26.0–34.0)
MCHC: 32.5 g/dL (ref 30.0–36.0)
MCV: 90.3 fL (ref 80.0–100.0)
Platelets: 462 10*3/uL — ABNORMAL HIGH (ref 150–400)
RBC: 3.41 MIL/uL — ABNORMAL LOW (ref 4.22–5.81)
RDW: 13.4 % (ref 11.5–15.5)
WBC: 6.5 10*3/uL (ref 4.0–10.5)
nRBC: 0 % (ref 0.0–0.2)

## 2019-11-21 LAB — GLUCOSE, CAPILLARY: Glucose-Capillary: 112 mg/dL — ABNORMAL HIGH (ref 70–99)

## 2019-11-21 MED ORDER — HYDROMORPHONE HCL 1 MG/ML IJ SOLN
1.0000 mg | INTRAMUSCULAR | Status: DC | PRN
  Administered 2019-11-21: 1 mg via INTRAVENOUS
  Administered 2019-11-21: 2 mg via INTRAVENOUS
  Administered 2019-11-22: 1 mg via INTRAVENOUS
  Administered 2019-11-22: 2 mg via INTRAVENOUS
  Administered 2019-11-22: 1 mg via INTRAVENOUS
  Administered 2019-11-22: 2 mg via INTRAVENOUS
  Filled 2019-11-21: qty 1
  Filled 2019-11-21 (×2): qty 2
  Filled 2019-11-21 (×5): qty 1

## 2019-11-21 MED ORDER — TRAVASOL 10 % IV SOLN
INTRAVENOUS | Status: AC
  Filled 2019-11-21: qty 1188

## 2019-11-21 MED ORDER — SODIUM CHLORIDE 0.9 % IV SOLN
3.0000 g | Freq: Four times a day (QID) | INTRAVENOUS | Status: DC
  Administered 2019-11-21 – 2019-11-25 (×16): 3 g via INTRAVENOUS
  Filled 2019-11-21: qty 3
  Filled 2019-11-21: qty 8
  Filled 2019-11-21 (×8): qty 3
  Filled 2019-11-21 (×3): qty 8
  Filled 2019-11-21 (×4): qty 3

## 2019-11-21 NOTE — Progress Notes (Signed)
TPN stopped at this time, barcode unreadable.

## 2019-11-21 NOTE — Progress Notes (Signed)
PHARMACY - TOTAL PARENTERAL NUTRITION CONSULT NOTE   Indication: prolonged po intolerance, bowel rest  Patient Measurements: Height: 6' (182.9 cm) Weight: 72.6 kg (160 lb) IBW/kg (Calculated) : 77.6 TPN AdjBW (KG): 72.6 Body mass index is 21.7 kg/m. Usual Weight: 78-82 kg prior to multiple hospital admissions/surgeries  Assessment: 44 yo male with colorectal cancer who underwent neoadjuvant chemo/radiation followed by low anterior resection 10/15/19 which was found to be complicated by breakdown of the rectal anastomosis with multiple associated intra-abdominal abscesses after patient presented on 10/31/2019 with abdominal pain. Pharmacy consulted to start TPN after inconsistent tolerance of diet. RLQ and R buttock drain in place.    Glucose / Insulin: No hx of DM. CBGs previously controlled on cycling and stable  Electrolytes: Phos 2.7 - wnl but decrease on 6/10. Mg 1.8 - wnl but low end of range on 6/10. Corrected Ca 9.82. Other electrolytes wnl on 6/10.  Renal: Scr stable.  LFTs / TGs: AST/ALT wnl. TG 93. Tbili wnl.  Prealbumin / albumin: prealbumin 13.8 (6/7) - decrease from 15 on 5/31. Albumin 2.6 (6/10).  Intake / Output; MIVF: D5LR @ KVO. 43mL/24 hrs output RLQ drain. 25mL/24 hrs output buttock GI Imaging: - 5/21 CT A/P: anastomotic breakdown at rectal anastomosis w/ extraluminal stool; intra-abdominal abscess, SBO - 5/27 CT A/P: all abscesses reduced in size; concern for ileus or possibly distal SBO Surgeries / Procedures:  - 5/5 LAR (see above) - 5/22 CT-guided drain placement x 3 (2 in RLQ, 1 transgluteal): purulent fluid +/- fecal material removed from all drains - RLQ drain removed 5/28 - 6/1 transgluteal drain exchanged d/t cellulitis at drain site & clogging d/t fecal material  Central access: PICC TPN start date: 5/28  Nutritional Goals (per RD recommendation on 6/3): kCal: 2200-2400, Protein: 105-120, Fluid: 2.2L Goal TPN volume of 2160 mL (provides 119 g of protein  and 2151 kcals per day)  Current Nutrition:  NPO  TPN  Plan:  Continue goal cyclic TPN of 2536UY over 12 hours (max glucose infusion rate 6.3) Electrolytes in TPN adjusted on 6/10: Phos increased to 20 mmol/L, Mg increased to 8 mEq/L Continued electrolytes in TPN:  75 mEq Na, 55 mEq/L K, 5 mEq/L Ca, Cl:Ac ratio 1:1 Add standard MVI and trace elements to TPN Continue off of CBG monitoring given tolerated previously  Continue M[facility]F at [name] Monitor TPN labs on Mon/Thurs, recheck electrolytes tomorrow Check prealbumin next on Monday to assess trend though at maximum protein and slightly under kcal goal  Cristela Felt, PharmD PGY1 Pharmacy Resident [employer]: [phone]

## 2019-11-21 NOTE — Progress Notes (Signed)
Supervising Physician: Sandi Mariscal  Patient Status:  [facility] - In-pt  Chief Complaint:  Anastomotic leak with RLQ and transgluteal drain  Subjective:  44 y.o, male inpatient. History of rectal adenocarcinoma s/p lower anterior resection on 5.5.21 with subsequent development of intraabdominal abscesses related to an anastomotic leak. IR placed a RLQ drain and 2 drains along the right transgluteal on 5.22.21. One transgluteal drain was removal on 5.28.21. Patient has had presented leaking of fecal matter around the transgluteal drain. Per note from Dr. [name] in surgery dated 6.11.21 " Will tentatively plan for ex lap and washout and ileostomy next week"   Allergies: Patient has no known allergies.  Medications: Prior to Admission medications   Medication Sig Start Date End Date Taking? Authorizing Provider  acetaminophen (TYLENOL) 500 MG tablet Take 500 mg by mouth every 6 (six) hours as needed for moderate pain.   Yes [provider]  gabapentin (NEURONTIN) 300 MG capsule Take 2 capsules (600 mg total) by mouth 2 (two) times daily. Patient taking differently: Take 600 mg by mouth 2 (two) times daily as needed (pain).  09/04/19  Yes Owens Shark, NP  ibuprofen (ADVIL) 200 MG tablet Take 400 mg by mouth every 6 (six) hours as needed for moderate pain.   Yes [provider]  LORazepam (ATIVAN) 0.5 MG tablet Take 1 tablet (0.5 mg total) by mouth every 8 (eight) hours as needed for anxiety. Or nausea. Do not drive while taking 01/75/10  Yes Owens Shark, NP  oxyCODONE (OXY IR/ROXICODONE) 5 MG immediate release tablet Take 1-2 tablets (5-10 mg total) by mouth every 6 (six) hours as needed for moderate pain, severe pain or breakthrough pain. 2/58/52  Yes Leighton Ruff, MD  lidocaine-prilocaine (EMLA) cream Apply 1 application topically as directed. Apply 1 hour prior to stick and cover with plastic wrap Patient not taking: Reported on 09/25/2019 07/10/19   Ladell Pier,  MD     Vital Signs: BP 120/74 (BP Location: Left Arm)   Pulse 65   Temp 98.2 F (36.8 C) (Oral)   Resp 17   Ht 6' (1.829 m)   Wt 154 lb 5.2 oz (70 kg)   SpO2 97%   BMI 20.93 kg/m   Physical Exam Vitals and nursing note reviewed.  Constitutional:      Appearance: He is well-developed.  HENT:     Head: Normocephalic.  Pulmonary:     Effort: Pulmonary effort is normal.  Abdominal:     Comments: Positive RUQ drain to gravity bag. site is unremarkable with no erythema, edema, tenderness, bleeding or drainage noted at exit site. Suture and stat lock in place. Dressing is clean dry and intact. 0 ml of   fluid noted in gravity device. Drain is able to be flushed easily.  Positive right transgluteal drain  to gravity bag.  Erythema, with fecal output around drain.  Dressing is clean dry and intact. 20 ml of  Light briwb colored fluid noted in gravity device. Drain is able to be flushed easily.      Musculoskeletal:        General: Normal range of motion.     Cervical back: Normal range of motion.  Skin:    General: Skin is dry.  Neurological:     Mental Status: He is alert and oriented to person, place, and time.     Imaging: No results found.  Labs:  CBC: Recent Labs    11/10/19 0300 11/16/19 0932  11/17/19 0503 11/21/19 0935  WBC 7.0 9.2 5.4 6.5  HGB 9.9* 10.7* 9.2* 10.0*  HCT 31.1* 33.2* 28.8* 30.8*  PLT 492* 612* 449* 462*    COAGS: Recent Labs    11/01/19 0731  INR 1.2    BMP: Recent Labs    11/11/19 0340 11/13/19 0327 11/17/19 0503 11/20/19 0425  NA 137 137 135 138  K 4.3 4.4 4.3 4.1  CL 103 103 101 105  CO2 24 25 24 23   GLUCOSE 130* 121* 111* 135*  BUN 15 19 23* 17  CALCIUM 8.8* 9.0 8.9 8.7*  CREATININE 0.68 0.60* 0.59* 0.52*  GFRNONAA >60 >60 >60 >60  GFRAA >60 >60 >60 >60    LIVER FUNCTION TESTS: Recent Labs    11/11/19 0340 11/13/19 0327 11/17/19 0503 11/20/19 0425  BILITOT 0.5 0.5 0.4 0.5  AST 15 18 16  14*  ALT 22 20 18  16   ALKPHOS 163* 155* 123 120  PROT 6.6 6.8 6.3* 6.3*  ALBUMIN 2.7* 2.9* 2.7* 2.6*    Assessment and Plan:  44 y.o, male inpatient. History of rectal adenocarcinoma s/p lower anterior resection on 5.5.21 with subsequent development of intraabdominal abscesses related to an anastomotic leak. IR placed a RLQ drain and 2 drains along the right transgluteal on 5.22.21. One transgluteal drain was removal on 5.28.21. Patient has had presented leaking of fecal matter around the transgluteal drain. Per note from Dr. [name] in surgery dated 6.11.21 " Will tentatively plan for ex lap and washout and ileostomy next week"  Per Epic output is  RLQ - 0 ml, 0 ml, 0 ml  Transgluteal  - 10 ml, 5 ml, 20 ml  Pertinent Imaging 6.6.21 - Ct abdominal pelvis reads Rectal anastomotic breakdown with decrease in size of complex surrounding perirectal gas and fluid collection, good positioning of transgluteal drain catheter. 2. Persistent small bowel obstruction. Transitional region near the right lower quadrant abscess which is decreasing in size with well-positioned percutaneous drain catheter.  Pertinent IR History 6.2.21- Exchange and reposition 5.28.21 - Transgluteal drain removed at bedside 5.22.21 - Placement RLQ drains X 2   Pertinent Allergies NKDA  All labs  are within acceptable parameters. Patient is on subcutaneous prophylactic dose of lovenox. Patient is afebrile.    Electronically Signed: Avel Peace, NP 11/21/2019, 3:05 PM   I spent a total of 15 Minutes at the patient's bedside AND on the patient's hospital floor or unit, greater than 50% of which was counseling/coordinating care for RLQ and transgluteal drain evaluation

## 2019-11-21 NOTE — Progress Notes (Signed)
Postprocedural intraabdominal abscess  Subjective: Had a rough night.  Trying to wean off of the PCA and use oxycodone instead.  Objective: Vital signs in last 24 hours: Temp:  [98.7 F (37.1 C)-99.5 F (37.5 C)] 99.1 F (37.3 C) (06/11 0551) Pulse Rate:  [71-77] 72 (06/11 0551) Resp:  [15-18] 16 (06/11 0742) BP: (112-128)/(72-90) 112/72 (06/11 0551) SpO2:  [95 %-100 %] 100 % (06/11 0742) FiO2 (%):  [95 %] 95 % (06/11 0742) Last BM Date: 11/19/19  Intake/Output from previous day: 06/10 0701 - 06/11 0700 In: 2741.1 [P.O.:420; I.V.:2301.1] Out: 1460 [Urine:1450; Drains:10] Intake/Output this shift: No intake/output data recorded.  General appearance: alert and cooperative GI: normal findings: soft, distended, appropriately tender Incision/Wound: clean, dry, intact Drains with purulent output TG drain with stool, TTP  Lab Results:  Results for orders placed or performed during the hospital encounter of 10/31/19 (from the past 24 hour(s))  Glucose, capillary     Status: Abnormal   Collection Time: 11/20/19 11:31 AM  Result Value Ref Range   Glucose-Capillary 105 (H) 70 - 99 mg/dL   Studies/Results Radiology: CT shows improving fluid collections, SBO     MEDS, Scheduled . acetaminophen  500 mg Oral TID WC & HS  . Chlorhexidine Gluconate Cloth  6 each Topical Daily  . enoxaparin (LOVENOX) injection  40 mg Subcutaneous Q24H  . gabapentin  600 mg Oral TID  . HYDROmorphone   Intravenous Q4H  . lip balm  1 application Topical BID  . metoprolol tartrate  12.5 mg Oral BID  . sodium chloride flush  5 mL Intracatheter Q8H  . Urelle  1 tablet Oral TID     Assessment: Postprocedural intraabdominal abscess Drains: per IR  Plan: NPO, ok for sips, chips, gum, hard candy and PO meds Cont PICC and TPN due to intolerance of PO.  Currently cycled to TPN at night Cont to try PO pain meds as tolerated Will d/c PCA per pt request.  Will do IVP dilaudid instead Ambulate in  hall ID: completed course of Zosyn and Eraxis.  Wbc wnl, recheck today Ok to shower if drains are covered  Drain site pain: cont frequent flushing, appreciate any help by IR team with this as pain control is the main issue right now Will try urised for pain with urination Will most likely need OR fecal diversion.  Pt not tolerating current regimen.  Will tentatively plan for ex lap and washout and ileostomy next week  LOS: 20 days    Rosario Adie, MD [facility], [place]    11/21/2019 8:43 AM

## 2019-11-22 LAB — PHOSPHORUS: Phosphorus: 3.1 mg/dL (ref 2.5–4.6)

## 2019-11-22 LAB — BASIC METABOLIC PANEL
Anion gap: 8 (ref 5–15)
BUN: 18 mg/dL (ref 6–20)
CO2: 25 mmol/L (ref 22–32)
Calcium: 8.5 mg/dL — ABNORMAL LOW (ref 8.9–10.3)
Chloride: 103 mmol/L (ref 98–111)
Creatinine, Ser: 0.5 mg/dL — ABNORMAL LOW (ref 0.61–1.24)
GFR calc Af Amer: >60 mL/min (ref >60)
GFR calc non Af Amer: >60 mL/min (ref >60)
Glucose, Bld: 137 mg/dL — ABNORMAL HIGH (ref 70–99)
Potassium: 4.3 mmol/L (ref 3.5–5.1)
Sodium: 136 mmol/L (ref 135–145)

## 2019-11-22 LAB — MAGNESIUM: Magnesium: 2 mg/dL (ref 1.7–2.4)

## 2019-11-22 MED ORDER — HYDROMORPHONE HCL 1 MG/ML IJ SOLN
1.0000 mg | INTRAMUSCULAR | Status: DC | PRN
  Administered 2019-11-22 – 2019-11-27 (×42): 2 mg via INTRAVENOUS
  Administered 2019-11-27: 1 mg via INTRAVENOUS
  Administered 2019-11-27 (×4): 2 mg via INTRAVENOUS
  Administered 2019-11-27: 1 mg via INTRAVENOUS
  Administered 2019-11-27 – 2019-11-28 (×6): 2 mg via INTRAVENOUS
  Administered 2019-11-28 (×2): 1 mg via INTRAVENOUS
  Filled 2019-11-22 (×9): qty 2
  Filled 2019-11-22: qty 1
  Filled 2019-11-22 (×21): qty 2
  Filled 2019-11-22: qty 1
  Filled 2019-11-22 (×24): qty 2

## 2019-11-22 MED ORDER — GABAPENTIN 300 MG PO CAPS
900.0000 mg | ORAL_CAPSULE | Freq: Three times a day (TID) | ORAL | Status: DC
  Administered 2019-11-22 – 2019-12-03 (×28): 900 mg via ORAL
  Filled 2019-11-22 (×29): qty 3

## 2019-11-22 MED ORDER — OXYCODONE HCL 5 MG PO TABS
5.0000 mg | ORAL_TABLET | ORAL | Status: DC | PRN
  Administered 2019-11-22 – 2019-11-23 (×6): 10 mg via ORAL
  Administered 2019-11-24: 15 mg via ORAL
  Administered 2019-11-24 – 2019-11-25 (×6): 10 mg via ORAL
  Administered 2019-11-27: 15 mg via ORAL
  Administered 2019-11-27: 10 mg via ORAL
  Administered 2019-11-28 (×3): 15 mg via ORAL
  Administered 2019-11-28: 10 mg via ORAL
  Administered 2019-11-29 – 2019-12-02 (×10): 15 mg via ORAL
  Filled 2019-11-22: qty 3
  Filled 2019-11-22: qty 2
  Filled 2019-11-22: qty 3
  Filled 2019-11-22: qty 2
  Filled 2019-11-22 (×2): qty 3
  Filled 2019-11-22: qty 2
  Filled 2019-11-22 (×2): qty 3
  Filled 2019-11-22 (×2): qty 2
  Filled 2019-11-22: qty 3
  Filled 2019-11-22: qty 2
  Filled 2019-11-22: qty 3
  Filled 2019-11-22 (×2): qty 2
  Filled 2019-11-22 (×3): qty 3
  Filled 2019-11-22: qty 2
  Filled 2019-11-22 (×2): qty 3
  Filled 2019-11-22 (×2): qty 2
  Filled 2019-11-22 (×4): qty 3
  Filled 2019-11-22 (×2): qty 2
  Filled 2019-11-22: qty 3
  Filled 2019-11-22: qty 2

## 2019-11-22 MED ORDER — TRAVASOL 10 % IV SOLN
INTRAVENOUS | Status: AC
  Filled 2019-11-22: qty 1188

## 2019-11-22 NOTE — Progress Notes (Signed)
Postprocedural intraabdominal abscess  Subjective: Still with pain from drain. He and his wife are requesting second opinion from Dr. Wanda Plump at [facility]   Objective: Vital signs in last 24 hours: Temp:  [98.1 F (36.7 C)-99.3 F (37.4 C)] 98.1 F (36.7 C) (06/12 0407) Pulse Rate:  [65-86] 82 (06/12 0407) Resp:  [17-18] 18 (06/12 0407) BP: (120-135)/(74-91) 135/91 (06/12 0407) SpO2:  [94 %-99 %] 99 % (06/12 0407) Last BM Date: 11/21/19  Intake/Output from previous day: 06/11 0701 - 06/12 0700 In: 2994 [P.O.:360; I.V.:2224; IV Piggyback:400] Out: 2275 [Urine:2150; Drains:125] Intake/Output this shift: Total I/O In: 0  Out: 350 [Urine:350]  General appearance: alert and cooperative, tearful Drains with purulent output TG drain with stool  Lab Results:  Results for orders placed or performed during the hospital encounter of 10/31/19 (from the past 24 hour(s))  CBC     Status: Abnormal   Collection Time: 11/21/19  9:35 AM  Result Value Ref Range   WBC 6.5 4.0 - 10.5 K/uL   RBC 3.41 (L) 4.22 - 5.81 MIL/uL   Hemoglobin 10.0 (L) 13.0 - 17.0 g/dL   HCT 30.8 (L) 39 - 52 %   MCV 90.3 80.0 - 100.0 fL   MCH 29.3 26.0 - 34.0 pg   MCHC 32.5 30.0 - 36.0 g/dL   RDW 13.4 11.5 - 15.5 %   Platelets 462 (H) 150 - 400 K/uL   nRBC 0.0 0.0 - 0.2 %  Glucose, capillary     Status: Abnormal   Collection Time: 11/21/19 12:39 PM  Result Value Ref Range   Glucose-Capillary 112 (H) 70 - 99 mg/dL  Basic metabolic panel     Status: Abnormal   Collection Time: 11/22/19  3:22 AM  Result Value Ref Range   Sodium 136 135 - 145 mmol/L   Potassium 4.3 3.5 - 5.1 mmol/L   Chloride 103 98 - 111 mmol/L   CO2 25 22 - 32 mmol/L   Glucose, Bld 137 (H) 70 - 99 mg/dL   BUN 18 6 - 20 mg/dL   Creatinine, Ser 0.50 (L) 0.61 - 1.24 mg/dL   Calcium 8.5 (L) 8.9 - 10.3 mg/dL   GFR calc non Af Amer >60 >60 mL/min   GFR calc Af Amer >60 >60 mL/min   Anion gap 8 5 - 15  Phosphorus     Status: None   Collection  Time: 11/22/19  3:22 AM  Result Value Ref Range   Phosphorus 3.1 2.5 - 4.6 mg/dL  Magnesium     Status: None   Collection Time: 11/22/19  3:22 AM  Result Value Ref Range   Magnesium 2.0 1.7 - 2.4 mg/dL   Studies/Results Radiology: CT shows improving fluid collections, SBO     MEDS, Scheduled . acetaminophen  500 mg Oral TID WC & HS  . Chlorhexidine Gluconate Cloth  6 each Topical Daily  . enoxaparin (LOVENOX) injection  40 mg Subcutaneous Q24H  . gabapentin  600 mg Oral TID  . lip balm  1 application Topical BID  . metoprolol tartrate  12.5 mg Oral BID  . sodium chloride flush  5 mL Intracatheter Q8H  . Urelle  1 tablet Oral TID     Assessment: Postprocedural intraabdominal abscess Drains: per IR  Plan: NPO, ok for sips, chips, gum, hard candy and PO meds Cont PICC and TPN due to intolerance of PO.  Currently cycled to TPN at night Cont to try PO pain meds as tolerated with IV breakthrough Ambulate  in hall ID: completed course of Zosyn and Eraxis Ok to shower if drains are covered  Drain site pain: cont frequent flushing, appreciate any help by IR team with this as pain control is the main issue right now Will try urised for pain with urination  Will need OR fecal diversion.  Pt not tolerating current regimen.  Dr. [name] tentatively plans for ex lap and washout and ileostomy next week. Patient requesting I call the [facility] [place] at [facility] to obtain a second opinion from Dr. Wanda Plump. I discussed that what this entails is transferring his care to [facility], not just getting an opinion by phone. He does not necessarily want to be transferred.  I recommend he have this conversation with Dr. [name].    LOS: 21 days    Clovis Riley, [facility], [place]    11/22/2019 8:24 AM

## 2019-11-22 NOTE — Progress Notes (Signed)
PHARMACY - TOTAL PARENTERAL NUTRITION CONSULT NOTE   Indication: prolonged po intolerance, bowel rest  Patient Measurements: Height: 6' (182.9 cm) Weight: 70 kg (154 lb 5.2 oz) IBW/kg (Calculated) : 77.6 TPN AdjBW (KG): 72.6 Body mass index is 20.93 kg/m. Usual Weight: 78-82 kg prior to multiple hospital admissions/surgeries  Assessment: 44 yo male with colorectal cancer who underwent neoadjuvant chemo/radiation followed by low anterior resection 10/15/19 which was found to be complicated by breakdown of the rectal anastomosis with multiple associated intra-abdominal abscesses after patient presented on 10/31/2019 with abdominal pain. Pharmacy consulted to start TPN after inconsistent tolerance of diet. RLQ and R buttock drain in place.    Glucose / Insulin: No hx of DM. CBGs previously controlled on cycling and stable  Electrolytes: wnl Renal: Scr stable.  LFTs / TGs: AST/ALT wnl. TG 93 (6/7). Tbili wnl.  Prealbumin / albumin: prealbumin 13.8 (6/7) - decrease from 15 on 5/31. Albumin 2.6 (6/10).  Intake / Output; MIVF: D5LR @ KVO. 107mL/24 hrs output RLQ drain. 134mL/24 hrs output buttock; I/O +719 GI Imaging: - 5/21 CT A/P: anastomotic breakdown at rectal anastomosis w/ extraluminal stool; intra-abdominal abscess, SBO - 5/27 CT A/P: all abscesses reduced in size; concern for ileus or possibly distal SBO Surgeries / Procedures:  - 5/5 LAR (see above) - 5/22 CT-guided drain placement x 3 (2 in RLQ, 1 transgluteal): purulent fluid +/- fecal material removed from all drains - RLQ drain removed 5/28 - 6/1 transgluteal drain exchanged d/t cellulitis at drain site & clogging d/t fecal material  Central access: PICC TPN start date: 5/28  Nutritional Goals (per RD recommendation on 6/9): kCal: 2200-2400, Protein: 105-120, Fluid: 2.2L Goal TPN volume of 2160 mL (provides 119 g of protein and 2151 kcals per day)  Current Nutrition:  NPO  TPN  Plan:  - Continue goal cyclic TPN of 2536UY  over 12 hours (max glucose infusion rate 6.5 mg/kg/min) - Continued electrolytes in TPN:  75 mEq Na, 55 mEq/L K, 5 mEq/L Ca, Mag 8 mEq/L, Phos 20 mmol/L, Cl:Ac ratio 1:1 - Add standard MVI and trace elements to TPN - Continue off of CBG monitoring given tolerated previously  - Continue M[facility]F at [facility] - Monitor TPN labs on Mon/Thurs, recheck electrolytes on 6/13 Check prealbumin next on Monday to assess trend though at maximum protein and slightly under kcal goal  Dia Sitter, PharmD, BCPS 11/22/2019 8:35 AM

## 2019-11-23 LAB — BASIC METABOLIC PANEL
Anion gap: 10 (ref 5–15)
BUN: 19 mg/dL (ref 6–20)
CO2: 25 mmol/L (ref 22–32)
Calcium: 8.9 mg/dL (ref 8.9–10.3)
Chloride: 103 mmol/L (ref 98–111)
Creatinine, Ser: 0.53 mg/dL — ABNORMAL LOW (ref 0.61–1.24)
GFR calc Af Amer: >60 mL/min (ref >60)
GFR calc non Af Amer: >60 mL/min (ref >60)
Glucose, Bld: 115 mg/dL — ABNORMAL HIGH (ref 70–99)
Potassium: 4.3 mmol/L (ref 3.5–5.1)
Sodium: 138 mmol/L (ref 135–145)

## 2019-11-23 LAB — PHOSPHORUS: Phosphorus: 3.9 mg/dL (ref 2.5–4.6)

## 2019-11-23 LAB — MAGNESIUM: Magnesium: 2.2 mg/dL (ref 1.7–2.4)

## 2019-11-23 MED ORDER — TRAVASOL 10 % IV SOLN
INTRAVENOUS | Status: AC
  Filled 2019-11-23: qty 1188

## 2019-11-23 NOTE — Progress Notes (Signed)
PHARMACY - TOTAL PARENTERAL NUTRITION CONSULT NOTE   Indication: prolonged po intolerance, bowel rest  Patient Measurements: Height: 6' (182.9 cm) Weight: 70 kg (154 lb 5.2 oz) IBW/kg (Calculated) : 77.6 TPN AdjBW (KG): 72.6 Body mass index is 20.93 kg/m. Usual Weight: 78-82 kg prior to multiple hospital admissions/surgeries  Assessment: 44 yo male with colorectal cancer who underwent neoadjuvant chemo/radiation followed by low anterior resection 10/15/19 which was found to be complicated by breakdown of the rectal anastomosis with multiple associated intra-abdominal abscesses after patient presented on 10/31/2019 with abdominal pain. Pharmacy consulted to start TPN after inconsistent tolerance of diet. RLQ and R buttock drain in place.    Glucose / Insulin: No hx of DM. CBGs previously controlled on cycling and stable  Electrolytes: Phos is wnl but trending up 3.9, Mag up 2.2. other lytes wnl Renal: Scr stable.  LFTs / TGs: AST/ALT wnl. TG 93 (6/7). Tbili wnl.  Prealbumin / albumin: prealbumin 13.8 (6/7) - decrease from 15 on 5/31. Albumin 2.6 (6/10).  Intake / Output; MIVF: D5LR @ KVO. 84mL/24 hrs output buttock; I/O +1763 GI Imaging: - 5/21 CT A/P: anastomotic breakdown at rectal anastomosis w/ extraluminal stool; intra-abdominal abscess, SBO - 5/27 CT A/P: all abscesses reduced in size; concern for ileus or possibly distal SBO Surgeries / Procedures:  - 5/5 LAR (see above) - 5/22 CT-guided drain placement x 3 (2 in RLQ, 1 transgluteal): purulent fluid +/- fecal material removed from all drains - RLQ drain removed 5/28 - 6/1 transgluteal drain exchanged d/t cellulitis at drain site & clogging d/t fecal material  Central access: PICC TPN start date: 5/28  Nutritional Goals (per RD recommendation on 6/9): kCal: 2200-2400, Protein: 105-120, Fluid: 2.2L Goal TPN volume of 2160 mL (provides 119 g of protein and 2151 kcals per day)  Current Nutrition:  NPO  TPN  Plan:  - Continue  goal cyclic TPN of 6945WT over 12 hours (max glucose infusion rate 6.5 mg/kg/min) - Continued electrolytes in TPN:  75 mEq Na, 55 mEq/L K, 5 mEq/L Ca, decrease Mag to 5 mEq/L, decrease Phos to 15 mmol/L, Cl:Ac ratio 1:1 - Add standard MVI and trace elements to TPN - Continue off of CBG monitoring given tolerated previously  - Continue M[facility]F at [facility] - Monitor TPN labs on Mon/Thurs Check prealbumin next on Monday to assess trend though at maximum protein and slightly under kcal goal  Dia Sitter, PharmD, BCPS 11/23/2019 8:29 AM

## 2019-11-23 NOTE — Progress Notes (Signed)
Postprocedural intraabdominal abscess  Subjective: In good spirits this morning, had a better night. Happy to see that he is on OR the schedule for Tuesday   Objective: Vital signs in last 24 hours: Temp:  [98 F (36.7 C)-98.9 F (37.2 C)] 98 F (36.7 C) (06/13 0551) Pulse Rate:  [68-75] 71 (06/13 0551) Resp:  [16-18] 16 (06/13 0551) BP: (118-133)/(77-92) 123/77 (06/13 0551) SpO2:  [95 %-100 %] 98 % (06/13 0551) Last BM Date: 11/22/19  Intake/Output from previous day: 06/12 0701 - 06/13 0700 In: 3263.1 [P.O.:680; I.V.:2178.1; IV Piggyback:400] Out: 1500 [Urine:1500] Intake/Output this shift: No intake/output data recorded.  General appearance: alert and cooperative   Lab Results:  Results for orders placed or performed during the hospital encounter of 10/31/19 (from the past 24 hour(s))  Basic metabolic panel     Status: Abnormal   Collection Time: 11/23/19  6:08 AM  Result Value Ref Range   Sodium 138 135 - 145 mmol/L   Potassium 4.3 3.5 - 5.1 mmol/L   Chloride 103 98 - 111 mmol/L   CO2 25 22 - 32 mmol/L   Glucose, Bld 115 (H) 70 - 99 mg/dL   BUN 19 6 - 20 mg/dL   Creatinine, Ser 0.53 (L) 0.61 - 1.24 mg/dL   Calcium 8.9 8.9 - 10.3 mg/dL   GFR calc non Af Amer >60 >60 mL/min   GFR calc Af Amer >60 >60 mL/min   Anion gap 10 5 - 15   Studies/Results Radiology: CT shows improving fluid collections, SBO     MEDS, Scheduled . acetaminophen  500 mg Oral TID WC & HS  . Chlorhexidine Gluconate Cloth  6 each Topical Daily  . enoxaparin (LOVENOX) injection  40 mg Subcutaneous Q24H  . gabapentin  900 mg Oral TID  . lip balm  1 application Topical BID  . metoprolol tartrate  12.5 mg Oral BID  . sodium chloride flush  5 mL Intracatheter Q8H  . Urelle  1 tablet Oral TID     Assessment: Postprocedural intraabdominal abscess Drains: per IR  Plan: NPO, ok for sips, chips, gum, hard candy and PO meds Cont PICC and TPN due to intolerance of PO.  Currently cycled to  TPN at night Cont to try PO pain meds as tolerated with IV breakthrough Ambulate in hall ID: completed course of Zosyn and Eraxis Ok to shower if drains are covered  Drain site pain: cont frequent flushing, unasyn, appreciate any help by IR team with this as pain control is the main issue right now Will try urised for pain with urination  Will need OR fecal diversion, currently posted for Tuesday. He is relieved to know that this is the plan.     LOS: 74 days    Clovis Riley, [place] [facility], [place]    11/23/2019 8:35 AM

## 2019-11-24 ENCOUNTER — Encounter: Payer: Commercial Managed Care - PPO | Admitting: Physical Therapy

## 2019-11-24 LAB — COMPREHENSIVE METABOLIC PANEL
ALT: 21 U/L (ref 0–44)
AST: 18 U/L (ref 15–41)
Albumin: 2.9 g/dL — ABNORMAL LOW (ref 3.5–5.0)
Alkaline Phosphatase: 113 U/L (ref 38–126)
Anion gap: 11 (ref 5–15)
BUN: 18 mg/dL (ref 6–20)
CO2: 25 mmol/L (ref 22–32)
Calcium: 8.8 mg/dL — ABNORMAL LOW (ref 8.9–10.3)
Chloride: 101 mmol/L (ref 98–111)
Creatinine, Ser: 0.6 mg/dL — ABNORMAL LOW (ref 0.61–1.24)
GFR calc Af Amer: >60 mL/min (ref >60)
GFR calc non Af Amer: >60 mL/min (ref >60)
Glucose, Bld: 109 mg/dL — ABNORMAL HIGH (ref 70–99)
Potassium: 4.3 mmol/L (ref 3.5–5.1)
Sodium: 137 mmol/L (ref 135–145)
Total Bilirubin: 0.6 mg/dL (ref 0.3–1.2)
Total Protein: 6.8 g/dL (ref 6.5–8.1)

## 2019-11-24 LAB — DIFFERENTIAL
Abs Immature Granulocytes: 0.02 10*3/uL (ref 0.00–0.07)
Basophils Absolute: 0 10*3/uL (ref 0.0–0.1)
Basophils Relative: 0 %
Eosinophils Absolute: 0.1 10*3/uL (ref 0.0–0.5)
Eosinophils Relative: 2 %
Immature Granulocytes: 0 %
Lymphocytes Relative: 12 %
Lymphs Abs: 0.7 10*3/uL (ref 0.7–4.0)
Monocytes Absolute: 0.8 10*3/uL (ref 0.1–1.0)
Monocytes Relative: 14 %
Neutro Abs: 4 10*3/uL (ref 1.7–7.7)
Neutrophils Relative %: 72 %

## 2019-11-24 LAB — CBC
HCT: 29.6 % — ABNORMAL LOW (ref 39.0–52.0)
Hemoglobin: 9.6 g/dL — ABNORMAL LOW (ref 13.0–17.0)
MCH: 28.7 pg (ref 26.0–34.0)
MCHC: 32.4 g/dL (ref 30.0–36.0)
MCV: 88.6 fL (ref 80.0–100.0)
Platelets: 397 10*3/uL (ref 150–400)
RBC: 3.34 MIL/uL — ABNORMAL LOW (ref 4.22–5.81)
RDW: 13.5 % (ref 11.5–15.5)
WBC: 5.6 10*3/uL (ref 4.0–10.5)
nRBC: 0 % (ref 0.0–0.2)

## 2019-11-24 LAB — SARS CORONAVIRUS 2 BY RT PCR (HOSPITAL ORDER, PERFORMED IN ~~LOC~~ HOSPITAL LAB): SARS Coronavirus 2: NEGATIVE

## 2019-11-24 LAB — MAGNESIUM: Magnesium: 2 mg/dL (ref 1.7–2.4)

## 2019-11-24 LAB — TRIGLYCERIDES: Triglycerides: 92 mg/dL (ref <150)

## 2019-11-24 LAB — PREALBUMIN: Prealbumin: 12.2 mg/dL — ABNORMAL LOW (ref 18–38)

## 2019-11-24 LAB — PHOSPHORUS: Phosphorus: 3.7 mg/dL (ref 2.5–4.6)

## 2019-11-24 MED ORDER — TRAVASOL 10 % IV SOLN
INTRAVENOUS | Status: AC
  Filled 2019-11-24: qty 1188

## 2019-11-24 NOTE — Progress Notes (Signed)
Postprocedural intraabdominal abscess  Subjective: Had a good weekend.  Ready for surgery tomorrow Objective: Vital signs in last 24 hours: Temp:  [98.2 F (36.8 C)-98.4 F (36.9 C)] 98.4 F (36.9 C) (06/14 0624) Pulse Rate:  [66-73] 73 (06/14 0624) Resp:  [16-18] 18 (06/14 0624) BP: (121-138)/(68-88) 138/88 (06/14 0624) SpO2:  [97 %-99 %] 99 % (06/14 0624) Last BM Date: 11/23/19  Intake/Output from previous day: 06/13 0701 - 06/14 0700 In: 3071.6 [P.O.:160; I.V.:2285; IV Piggyback:626.7] Out: 1675 [Urine:1600; Drains:75] Intake/Output this shift: No intake/output data recorded.  General appearance: alert and cooperative GI: normal findings: soft, distended, appropriately tender Incision/Wound: clean, dry, intact Drains with purulent output TG drain with stool  Lab Results:  Results for orders placed or performed during the hospital encounter of 10/31/19 (from the past 24 hour(s))  Comprehensive metabolic panel     Status: Abnormal   Collection Time: 11/24/19  6:33 AM  Result Value Ref Range   Sodium 137 135 - 145 mmol/L   Potassium 4.3 3.5 - 5.1 mmol/L   Chloride 101 98 - 111 mmol/L   CO2 25 22 - 32 mmol/L   Glucose, Bld 109 (H) 70 - 99 mg/dL   BUN 18 6 - 20 mg/dL   Creatinine, Ser 0.60 (L) 0.61 - 1.24 mg/dL   Calcium 8.8 (L) 8.9 - 10.3 mg/dL   Total Protein 6.8 6.5 - 8.1 g/dL   Albumin 2.9 (L) 3.5 - 5.0 g/dL   AST 18 15 - 41 U/L   ALT 21 0 - 44 U/L   Alkaline Phosphatase 113 38 - 126 U/L   Total Bilirubin 0.6 0.3 - 1.2 mg/dL   GFR calc non Af Amer >60 >60 mL/min   GFR calc Af Amer >60 >60 mL/min   Anion gap 11 5 - 15  Magnesium     Status: None   Collection Time: 11/24/19  6:33 AM  Result Value Ref Range   Magnesium 2.0 1.7 - 2.4 mg/dL  Phosphorus     Status: None   Collection Time: 11/24/19  6:33 AM  Result Value Ref Range   Phosphorus 3.7 2.5 - 4.6 mg/dL  CBC     Status: Abnormal   Collection Time: 11/24/19  6:33 AM  Result Value Ref Range   WBC 5.6  4.0 - 10.5 K/uL   RBC 3.34 (L) 4.22 - 5.81 MIL/uL   Hemoglobin 9.6 (L) 13.0 - 17.0 g/dL   HCT 29.6 (L) 39 - 52 %   MCV 88.6 80.0 - 100.0 fL   MCH 28.7 26.0 - 34.0 pg   MCHC 32.4 30.0 - 36.0 g/dL   RDW 13.5 11.5 - 15.5 %   Platelets 397 150 - 400 K/uL   nRBC 0.0 0.0 - 0.2 %  Differential     Status: None   Collection Time: 11/24/19  6:33 AM  Result Value Ref Range   Neutrophils Relative % 72 %   Neutro Abs 4.0 1.7 - 7.7 K/uL   Lymphocytes Relative 12 %   Lymphs Abs 0.7 0.7 - 4.0 K/uL   Monocytes Relative 14 %   Monocytes Absolute 0.8 0 - 1 K/uL   Eosinophils Relative 2 %   Eosinophils Absolute 0.1 0 - 0 K/uL   Basophils Relative 0 %   Basophils Absolute 0.0 0 - 0 K/uL   Immature Granulocytes 0 %   Abs Immature Granulocytes 0.02 0.00 - 0.07 K/uL  Triglycerides     Status: None   Collection Time: 11/24/19  6:33 AM  Result Value Ref Range   Triglycerides 92 <150 mg/dL  Prealbumin     Status: Abnormal   Collection Time: 11/24/19  6:33 AM  Result Value Ref Range   Prealbumin 12.2 (L) 18 - 38 mg/dL   Studies/Results Radiology: CT shows improving fluid collections, SBO     MEDS, Scheduled . acetaminophen  500 mg Oral TID WC & HS  . Chlorhexidine Gluconate Cloth  6 each Topical Daily  . enoxaparin (LOVENOX) injection  40 mg Subcutaneous Q24H  . gabapentin  900 mg Oral TID  . lip balm  1 application Topical BID  . metoprolol tartrate  12.5 mg Oral BID  . sodium chloride flush  5 mL Intracatheter Q8H  . Urelle  1 tablet Oral TID     Assessment: Postprocedural intraabdominal abscess Drains: per IR  Plan: NPO, ok for sips, chips, gum, hard candy and PO meds Cont PICC and TPN due to intolerance of PO.  Currently cycled to TPN at night Cont to try PO pain meds as tolerated Dilaudid IVP PRN Ambulate in hall ID: completed course of Zosyn and Eraxis.  Wbc wnl Ok to shower if drains are covered  Drain site pain: cont frequent flushing, unasyn for drain site  inflammation Will try urised for pain with urination Plan for OR tomorrow with fecal diversion, washout and drain placement Ostomy RN today for marking  LOS: 57 days    Rosario Adie, MD [facility], [place]    11/24/2019 8:27 AM

## 2019-11-24 NOTE — Progress Notes (Signed)
PHARMACY - TOTAL PARENTERAL NUTRITION CONSULT NOTE   Indication: prolonged PO intolerance, bowel rest  Patient Measurements: Height: 6' (182.9 cm) Weight: 70 kg (154 lb 5.2 oz) IBW/kg (Calculated) : 77.6 TPN AdjBW (KG): 72.6 Body mass index is 20.93 kg/m. Usual Weight: 78-82 kg prior to multiple hospital admissions/surgeries  Assessment: 44 yo male with colorectal cancer who underwent neoadjuvant chemo/radiation followed by low anterior resection 10/15/19 which was found to be complicated by breakdown of the rectal anastomosis with multiple associated intra-abdominal abscesses after patient presented on 10/31/2019 with abdominal pain. Pharmacy consulted to start TPN after inconsistent tolerance of diet. RLQ and R buttock drain in place.    Glucose / Insulin: No hx of DM. CBGs previously controlled on cycling and stable at goal Electrolytes: All WNL including CorrCa 9.7 Renal: Scr & BUN stable WNL.  LFTs / TGs: AST/ALT WNL. TG 92 (6/14). Tbili wnl.  Prealbumin / albumin: prealbumin 12.2 (6/14) slightly decreased; Albumin 2.9 (6/14).  Intake / Output; MIVF: D5LR @ KVO. 9mL/24 hrs drain output; unmeasured UOP GI Imaging: - 5/21 CT A/P: anastomotic breakdown at rectal anastomosis w/ extraluminal stool; intra-abdominal abscess, SBO - 5/27 CT A/P: all abscesses reduced in size; concern for ileus or possibly distal SBO Surgeries / Procedures:  - 5/5 LAR (see above) - 5/22 CT-guided drain placement x 3 (2 in RLQ, 1 transgluteal): purulent fluid +/- fecal material removed from all drains - RLQ drain removed 5/28 - 6/1 transgluteal drain exchanged d/t cellulitis at drain site & clogging d/t fecal material  Central access: PICC TPN start date: 5/28  Nutritional Goals (per RD recommendation on 6/9): kCal: 2200-2400, Protein: 105-120, Fluid: 2.2L  Goal TPN volume of 2160 mL (provides 119 g of protein, 302 g dextrose, and 2151 kcals per day)  Current Nutrition:  -NPO except for ice chips,  sips with meds -TPN  Plan:   Continue goal cyclic TPN of 0488QB over 12 hours (max glucose infusion rate 6.5 mg/kg/min)  Continued electrolytes in TPN with no changes    75 mEq Na, 55 mEq/L K, 5 mEq/L Ca, Mg 5 mEq/L,  Phos 15 mmol/L, Cl:Ac ratio 1:1  Continue standard MVI and trace elements in TPN  Continue off of CBG monitoring given tolerated previously with BG at goal  [facility] at [facility]  Monitor TPN labs on Mon/Thurs  Lenis Noon, PharmD 11/24/19 9:20 AM

## 2019-11-24 NOTE — Consult Note (Signed)
[place] Nurse ostomy consult note  [place] Nurse requested for preoperative stoma site marking for an ileostomy by Dr. [name].  Discussed surgical procedure and stoma creation with patient.  Explained role of the [place] nurse team.Answered patient questions.   Examined patient lying, sitting, and standing in order to place the marking in the patient's visual field, away from any creases or abdominal contour issues and within the rectus muscle.    Marked for ileostomy in the RUQ, 6cm to the right of the umbilicus and  5cm above the umbilicus.  Patient's abdomen cleansed with CHG wipes at site markings, allowed to air dry prior to marking.Covered mark with thin film transparent dressing to preserve mark until date of surgery (11/25/19).   [place] Nurse team will follow up with patient after surgery for continue ostomy care and teaching.   Thank you for the opportunity to meet and mark this patient preoperatively.  Maudie Flakes, MSN, RN, [place], [facility]  Pager# [phone]

## 2019-11-25 ENCOUNTER — Inpatient Hospital Stay (HOSPITAL_COMMUNITY): Payer: Commercial Managed Care - PPO | Admitting: Registered Nurse

## 2019-11-25 ENCOUNTER — Encounter (HOSPITAL_COMMUNITY)
Admission: AD | Disposition: A | Discharge status: Home / Self Care | Payer: Self-pay | Source: Ambulatory Visit | Attending: General Surgery

## 2019-11-25 HISTORY — PX: APPLICATION OF WOUND VAC: SHX5189

## 2019-11-25 HISTORY — PX: LAPAROTOMY: SHX154

## 2019-11-25 HISTORY — PX: ILEOSTOMY: SHX1783

## 2019-11-25 LAB — MRSA PCR SCREENING: MRSA by PCR: NEGATIVE

## 2019-11-25 SURGERY — LAPAROTOMY, EXPLORATORY
Anesthesia: General | Site: Abdomen | Laterality: Right

## 2019-11-25 MED ORDER — 0.9 % SODIUM CHLORIDE (POUR BTL) OPTIME
TOPICAL | Status: DC | PRN
  Administered 2019-11-25: 5000 mL

## 2019-11-25 MED ORDER — PHENYLEPHRINE 40 MCG/ML (10ML) SYRINGE FOR IV PUSH (FOR BLOOD PRESSURE SUPPORT)
PREFILLED_SYRINGE | INTRAVENOUS | Status: DC | PRN
  Administered 2019-11-25 (×3): 80 ug via INTRAVENOUS
  Administered 2019-11-25: 40 ug via INTRAVENOUS

## 2019-11-25 MED ORDER — SODIUM CHLORIDE 0.9 % IV SOLN
2.0000 g | INTRAVENOUS | Status: AC
  Administered 2019-11-25: 2 g via INTRAVENOUS
  Filled 2019-11-25: qty 2

## 2019-11-25 MED ORDER — LIDOCAINE 2% (20 MG/ML) 5 ML SYRINGE
INTRAMUSCULAR | Status: AC
  Filled 2019-11-25: qty 5

## 2019-11-25 MED ORDER — FENTANYL CITRATE (PF) 100 MCG/2ML IJ SOLN
25.0000 ug | INTRAMUSCULAR | Status: DC | PRN
  Administered 2019-11-25 (×3): 50 ug via INTRAVENOUS

## 2019-11-25 MED ORDER — PHENYLEPHRINE 40 MCG/ML (10ML) SYRINGE FOR IV PUSH (FOR BLOOD PRESSURE SUPPORT)
PREFILLED_SYRINGE | INTRAVENOUS | Status: AC
  Filled 2019-11-25: qty 20

## 2019-11-25 MED ORDER — ROCURONIUM BROMIDE 10 MG/ML (PF) SYRINGE
PREFILLED_SYRINGE | INTRAVENOUS | Status: AC
  Filled 2019-11-25: qty 10

## 2019-11-25 MED ORDER — ONDANSETRON HCL 4 MG/2ML IJ SOLN
INTRAMUSCULAR | Status: AC
  Filled 2019-11-25: qty 2

## 2019-11-25 MED ORDER — PIPERACILLIN-TAZOBACTAM 3.375 G IVPB
INTRAVENOUS | Status: AC
  Filled 2019-11-25: qty 50

## 2019-11-25 MED ORDER — ONDANSETRON HCL 4 MG/2ML IJ SOLN
INTRAMUSCULAR | Status: DC | PRN
  Administered 2019-11-25: 4 mg via INTRAVENOUS

## 2019-11-25 MED ORDER — SUCCINYLCHOLINE CHLORIDE 200 MG/10ML IV SOSY
PREFILLED_SYRINGE | INTRAVENOUS | Status: AC
  Filled 2019-11-25: qty 10

## 2019-11-25 MED ORDER — FENTANYL CITRATE (PF) 100 MCG/2ML IJ SOLN
INTRAMUSCULAR | Status: DC | PRN
  Administered 2019-11-25: 12.5 ug via INTRAVENOUS
  Administered 2019-11-25 (×2): 50 ug via INTRAVENOUS
  Administered 2019-11-25: 25 ug via INTRAVENOUS
  Administered 2019-11-25: 12.5 ug via INTRAVENOUS
  Administered 2019-11-25: 25 ug via INTRAVENOUS
  Administered 2019-11-25: 50 ug via INTRAVENOUS
  Administered 2019-11-25: 25 ug via INTRAVENOUS
  Administered 2019-11-25: 50 ug via INTRAVENOUS

## 2019-11-25 MED ORDER — KETAMINE HCL 10 MG/ML IJ SOLN
INTRAMUSCULAR | Status: DC | PRN
  Administered 2019-11-25: 30 mg via INTRAVENOUS

## 2019-11-25 MED ORDER — SUFENTANIL CITRATE 50 MCG/ML IV SOLN
INTRAVENOUS | Status: AC
  Filled 2019-11-25: qty 1

## 2019-11-25 MED ORDER — MIDAZOLAM HCL 5 MG/5ML IJ SOLN
INTRAMUSCULAR | Status: DC | PRN
  Administered 2019-11-25 (×2): 1 mg via INTRAVENOUS

## 2019-11-25 MED ORDER — DEXAMETHASONE SODIUM PHOSPHATE 4 MG/ML IJ SOLN
INTRAMUSCULAR | Status: DC | PRN
  Administered 2019-11-25: 8 mg via INTRAVENOUS

## 2019-11-25 MED ORDER — MIDAZOLAM HCL 2 MG/2ML IJ SOLN
INTRAMUSCULAR | Status: AC
  Filled 2019-11-25: qty 2

## 2019-11-25 MED ORDER — SODIUM CHLORIDE (PF) 0.9 % IJ SOLN
INTRAMUSCULAR | Status: AC
  Filled 2019-11-25: qty 10

## 2019-11-25 MED ORDER — LACTATED RINGERS IV SOLN: INTRAVENOUS | Status: DC

## 2019-11-25 MED ORDER — FENTANYL CITRATE (PF) 100 MCG/2ML IJ SOLN
INTRAMUSCULAR | Status: AC
  Filled 2019-11-25: qty 4

## 2019-11-25 MED ORDER — OXYCODONE HCL 5 MG/5ML PO SOLN: 5.0000 mg | Freq: Once | ORAL | Status: DC | PRN

## 2019-11-25 MED ORDER — DEXAMETHASONE SODIUM PHOSPHATE 10 MG/ML IJ SOLN
INTRAMUSCULAR | Status: AC
  Filled 2019-11-25: qty 1

## 2019-11-25 MED ORDER — FENTANYL CITRATE (PF) 100 MCG/2ML IJ SOLN
INTRAMUSCULAR | Status: AC
  Filled 2019-11-25: qty 2

## 2019-11-25 MED ORDER — PROPOFOL 10 MG/ML IV BOLUS
INTRAVENOUS | Status: DC | PRN
  Administered 2019-11-25: 150 mg via INTRAVENOUS

## 2019-11-25 MED ORDER — PROPOFOL 10 MG/ML IV BOLUS
INTRAVENOUS | Status: AC
  Filled 2019-11-25: qty 20

## 2019-11-25 MED ORDER — LIDOCAINE 2% (20 MG/ML) 5 ML SYRINGE
INTRAMUSCULAR | Status: AC
  Filled 2019-11-25: qty 10

## 2019-11-25 MED ORDER — SUGAMMADEX SODIUM 200 MG/2ML IV SOLN
INTRAVENOUS | Status: DC | PRN
  Administered 2019-11-25: 200 mg via INTRAVENOUS

## 2019-11-25 MED ORDER — BUPIVACAINE LIPOSOME 1.3 % IJ SUSP
20.0000 mL | Freq: Once | INTRAMUSCULAR | Status: AC
  Administered 2019-11-25: 20 mL
  Filled 2019-11-25: qty 20

## 2019-11-25 MED ORDER — ROCURONIUM BROMIDE 10 MG/ML (PF) SYRINGE
PREFILLED_SYRINGE | INTRAVENOUS | Status: DC | PRN
  Administered 2019-11-25: 50 mg via INTRAVENOUS
  Administered 2019-11-25: 20 mg via INTRAVENOUS

## 2019-11-25 MED ORDER — LIDOCAINE 2% (20 MG/ML) 5 ML SYRINGE
INTRAMUSCULAR | Status: DC | PRN
  Administered 2019-11-25: 80 mg via INTRAVENOUS

## 2019-11-25 MED ORDER — TRAVASOL 10 % IV SOLN
INTRAVENOUS | Status: AC
  Filled 2019-11-25: qty 1188

## 2019-11-25 MED ORDER — PROMETHAZINE HCL 25 MG/ML IJ SOLN: 6.2500 mg | INTRAMUSCULAR | Status: DC | PRN

## 2019-11-25 MED ORDER — OXYCODONE HCL 5 MG PO TABS: 5.0000 mg | ORAL_TABLET | Freq: Once | ORAL | Status: DC | PRN

## 2019-11-25 SURGICAL SUPPLY — 50 items
BLADE EXTENDED COATED 6.5IN (ELECTRODE) IMPLANT
CANISTER WOUND CARE 500ML ATS (WOUND CARE) ×3 IMPLANT
CATH ROBINSON RED A/P 22FR (CATHETERS) ×6 IMPLANT
CELLS DAT CNTRL 66122 CELL SVR (MISCELLANEOUS) IMPLANT
CHLORAPREP W/TINT 26 (MISCELLANEOUS) ×3 IMPLANT
COVER WAND RF STERILE (DRAPES) IMPLANT
DRAIN CHANNEL 19F RND (DRAIN) ×3 IMPLANT
DRAPE LAPAROSCOPIC ABDOMINAL (DRAPES) ×3 IMPLANT
DRAPE SHEET LG 3/4 BI-LAMINATE (DRAPES) IMPLANT
DRSG OPSITE POSTOP 4X10 (GAUZE/BANDAGES/DRESSINGS) IMPLANT
DRSG OPSITE POSTOP 4X6 (GAUZE/BANDAGES/DRESSINGS) IMPLANT
DRSG OPSITE POSTOP 4X8 (GAUZE/BANDAGES/DRESSINGS) IMPLANT
DRSG VAC ATS SM SENSATRAC (GAUZE/BANDAGES/DRESSINGS) ×3 IMPLANT
ELECT REM PT RETURN 15FT ADLT (MISCELLANEOUS) ×3 IMPLANT
EVACUATOR 1/8 PVC DRAIN (DRAIN) ×3 IMPLANT
EVACUATOR SILICONE 100CC (DRAIN) ×3 IMPLANT
GAUZE SPONGE 4X4 12PLY STRL (GAUZE/BANDAGES/DRESSINGS) IMPLANT
GLOVE BIO SURGEON STRL SZ 6.5 (GLOVE) ×6 IMPLANT
GLOVE BIOGEL PI IND STRL 7.0 (GLOVE) ×4 IMPLANT
GLOVE BIOGEL PI INDICATOR 7.0 (GLOVE) ×2
GLOVE ECLIPSE 8.0 STRL XLNG CF (GLOVE) ×3 IMPLANT
GOWN STRL REUS W/TWL XL LVL3 (GOWN DISPOSABLE) ×9 IMPLANT
HANDLE SUCTION POOLE (INSTRUMENTS) ×4 IMPLANT
KIT SIGMOIDOSCOPE (SET/KITS/TRAYS/PACK) ×3 IMPLANT
KIT TURNOVER KIT A (KITS) IMPLANT
LEGGING LITHOTOMY PAIR STRL (DRAPES) ×3 IMPLANT
PENCIL SMOKE EVACUATOR (MISCELLANEOUS) IMPLANT
RETRACTOR WND ALEXIS 25 LRG (MISCELLANEOUS) ×2 IMPLANT
RTRCTR WOUND ALEXIS 18CM MED (MISCELLANEOUS)
RTRCTR WOUND ALEXIS 25CM LRG (MISCELLANEOUS) ×3
STAPLER VISISTAT 35W (STAPLE) ×3 IMPLANT
SUCTION POOLE HANDLE (INSTRUMENTS) ×6
SUT ETHILON 2 0 PS N (SUTURE) ×6 IMPLANT
SUT ETHILON 3 0 PS 1 (SUTURE) IMPLANT
SUT NOVA 1 T20/GS 25DT (SUTURE) IMPLANT
SUT NOVA NAB DX-16 0-1 5-0 T12 (SUTURE) ×3 IMPLANT
SUT PDS AB 1 CTX 36 (SUTURE) IMPLANT
SUT SILK 2 0 (SUTURE) ×3
SUT SILK 2 0 SH CR/8 (SUTURE) ×6 IMPLANT
SUT SILK 2-0 18XBRD TIE 12 (SUTURE) ×2 IMPLANT
SUT SILK 3 0 (SUTURE) ×3
SUT SILK 3 0 SH CR/8 (SUTURE) IMPLANT
SUT SILK 3-0 18XBRD TIE 12 (SUTURE) ×2 IMPLANT
SUT VIC AB 2-0 SH 18 (SUTURE) IMPLANT
SUT VIC AB 3-0 SH 8-18 (SUTURE) ×6 IMPLANT
TOWEL OR 17X26 10 PK STRL BLUE (TOWEL DISPOSABLE) IMPLANT
TOWEL OR NON WOVEN STRL DISP B (DISPOSABLE) ×3 IMPLANT
TRAY COLON PACK (CUSTOM PROCEDURE TRAY) ×3 IMPLANT
TRAY FOLEY MTR SLVR 16FR STAT (SET/KITS/TRAYS/PACK) ×3 IMPLANT
TUBING CONNECTING 10 (TUBING) ×6 IMPLANT

## 2019-11-25 NOTE — Progress Notes (Signed)
Postprocedural intraabdominal abscess  Subjective:   Ready for surgery today Objective: Vital signs in last 24 hours: Temp:  [98 F (36.7 C)-99.3 F (37.4 C)] 98.1 F (36.7 C) (06/15 0541) Pulse Rate:  [68-75] 73 (06/15 0541) Resp:  [16] 16 (06/15 0541) BP: (113-131)/(81-90) 131/89 (06/15 0541) SpO2:  [96 %-97 %] 97 % (06/15 0541) Last BM Date: 11/24/19  Intake/Output from previous day: 06/14 0701 - 06/15 0700 In: 2663 [P.O.:60; I.V.:2223; IV Piggyback:350] Out: 1990 [LXBWI:2035; Drains:40] Intake/Output this shift: No intake/output data recorded.  General appearance: alert and cooperative GI: normal findings: soft, distended, appropriately tender Incision/Wound: clean, dry, intact Drains with purulent output TG drain with stool  Lab Results:  Results for orders placed or performed during the hospital encounter of 10/31/19 (from the past 24 hour(s))  SARS Coronavirus 2 by RT PCR (hospital order, performed in [facility] hospital lab) Nasopharyngeal Nasopharyngeal Swab     Status: None   Collection Time: 11/24/19  2:46 PM   Specimen: Nasopharyngeal Swab  Result Value Ref Range   SARS Coronavirus 2 NEGATIVE NEGATIVE  MRSA PCR Screening     Status: None   Collection Time: 11/24/19 10:51 PM   Specimen: Nasal Mucosa; Nasopharyngeal  Result Value Ref Range   MRSA by PCR NEGATIVE NEGATIVE   Studies/Results Radiology: CT shows improving fluid collections, SBO     MEDS, Scheduled . acetaminophen  500 mg Oral TID WC & HS  . Chlorhexidine Gluconate Cloth  6 each Topical Daily  . enoxaparin (LOVENOX) injection  40 mg Subcutaneous Q24H  . gabapentin  900 mg Oral TID  . lip balm  1 application Topical BID  . metoprolol tartrate  12.5 mg Oral BID  . sodium chloride flush  5 mL Intracatheter Q8H  . Urelle  1 tablet Oral TID     Assessment: Postprocedural intraabdominal abscess Drains: per IR  Plan: NPO, ok for sips, chips, gum, hard candy and PO meds Cont PICC and  TPN due to intolerance of PO.  Currently cycled to TPN at night Cont to try PO pain meds as tolerated Dilaudid IVP PRN Ambulate in hall ID: completed course of Zosyn and Eraxis.  Wbc wnl Plan for OR today with fecal diversion, washout and drain placement Risks discussed with patient which include continued infection, ileus, damage to adjacent structures, bleeding and need for additional procedures.  All questions answered.   LOS: 24 days    Rosario Adie, MD [facility], [place]    11/25/2019 9:10 AM

## 2019-11-25 NOTE — Anesthesia Postprocedure Evaluation (Signed)
Anesthesia Post Note  Patient: Brandon Barry  Procedure(s) Performed: Sela Hilding WITH OPEN PELVIC WASHOUT (N/A Abdomen) APPLICATION OF WOUND VAC AND DRAIN PLACEMENT (N/A Abdomen) PLACEMENT OF ILEOSTOMY (Right Abdomen)     Patient location during evaluation: PACU Anesthesia Type: General Level of consciousness: awake and alert Pain management: pain level controlled Vital Signs Assessment: post-procedure vital signs reviewed and stable Respiratory status: spontaneous breathing, nonlabored ventilation, respiratory function stable and patient connected to nasal cannula oxygen Cardiovascular status: blood pressure returned to baseline and stable Postop Assessment: no apparent nausea or vomiting Anesthetic complications: no   No complications documented.  Last Vitals:  Vitals:   11/25/19 1850 11/25/19 2003  BP: (!) 140/98 (!) 142/100  Pulse: 98 100  Resp: 19 16  Temp: 37.1 C 37 C  SpO2: 98% 99%    Last Pain:  Vitals:   11/25/19 2003  TempSrc: Oral  PainSc:                  Emillee Talsma

## 2019-11-25 NOTE — Progress Notes (Signed)
Nutrition Follow-up  INTERVENTION:   -Cyclic TPN per Pharmacy  NUTRITION DIAGNOSIS:   Increased nutrient needs related to chronic illness, cancer and cancer related treatments as evidenced by estimated needs.  Ongoing.  GOAL:   Patient will meet greater than or equal to 90% of their needs  Meeting with TPN.  MONITOR:   Labs, Weight trends, I & O's, Skin (TPN)  ASSESSMENT:   44 year old male who underwent robotic low anterior resection on Oct 14, 6604 by Dr. Leighton Ruff for an adenocarcinoma of the rectum.  Patient was discharged home on Oct 21, 2019.  Since discharge the patient has had persistent lower abdominal pain.  5/22: s/p CT guided drainage of percutaneous catheter 5/23: pt on clear liquid diet 5/24: diet was advanced to fulls 5/28: NPO, TPN initiation 6/1: s/p Rt TG drain exchange  Patient currently in OR for fecal diversion and drain placement. Pt continues to have cyclic TPN at night (providing 2151 kcals and 118g protein daily).  Admission weight: 160 lbs. Weight on 6/11: 154 lbs.   I/Os:  +16.6L since 6/1 UOP: 2.5L x 24 hrs Drains: 40 ml x 24 hrs  Labs reviewed. Medications: D5 in lactated ringers infusion  Diet Order:   Diet Order            Diet NPO time specified Except for: Ice Chips, Sips with Meds  Diet effective now                 EDUCATION NEEDS:   No education needs have been identified at this time  Skin:  Skin Assessment: Skin Integrity Issues: Skin Integrity Issues:: Incisions Incisions: abdominal  Last BM:  6/15  Height:   Ht Readings from Last 1 Encounters:  10/31/19 6' (1.829 m)    Weight:   Wt Readings from Last 1 Encounters:  11/21/19 70 kg    Ideal Body Weight:     BMI:  Body mass index is 20.93 kg/m.  Estimated Nutritional Needs:   Kcal:  2200-2400  Protein:  105-120g  Fluid:  2.2L/day  Clayton Bibles, MS, RD, LDN Inpatient Clinical Dietitian Contact information available via Amion

## 2019-11-25 NOTE — Transfer of Care (Signed)
Immediate Anesthesia Transfer of Care Note  Patient: Brandon Barry  Procedure(s) Performed: Procedure(s): EXPLORATOR LAPAROTMY WITH OPEN PELVIC WASHOUT (N/A) APPLICATION OF WOUND VAC AND DRAIN PLACEMENT (N/A) PLACEMENT OF ILEOSTOMY (Right)  Patient Location: PACU  Anesthesia Type:General  Level of Consciousness: Patient easily awoken, sedated, comfortable, cooperative, following commands, responds to stimulation.   Airway & Oxygen Therapy: Patient spontaneously breathing, ventilating well, oxygen via simple oxygen mask.  Post-op Assessment: Report given to PACU RN, vital signs reviewed and stable, moving all extremities.   Post vital signs: Reviewed and stable.  Complications: No apparent anesthesia complications  Last Vitals:  Vitals Value Taken Time  BP 149/106 11/25/19 1552  Temp    Pulse 90 11/25/19 1555  Resp 15 11/25/19 1555  SpO2 100 % 11/25/19 1555  Vitals shown include unvalidated device data.  Last Pain:  Vitals:   11/25/19 1244  TempSrc: Oral  PainSc:       Patients Stated Pain Goal: 1 (01/41/03 0131)  Complications: No complications documented.

## 2019-11-25 NOTE — Anesthesia Procedure Notes (Signed)
Procedure Name: Intubation Date/Time: 11/25/2019 1:38 PM Performed by: Deliah Boston, CRNA Pre-anesthesia Checklist: Patient identified, Emergency Drugs available, Suction available and Patient being monitored Patient Re-evaluated:Patient Re-evaluated prior to induction Oxygen Delivery Method: Circle system utilized Preoxygenation: Pre-oxygenation with 100% oxygen Induction Type: IV induction Ventilation: Mask ventilation without difficulty Laryngoscope Size: Mac and 4 Grade View: Grade II Tube type: Oral Tube size: 7.5 mm Number of attempts: 1 Airway Equipment and Method: Stylet and Oral airway Placement Confirmation: ETT inserted through vocal cords under direct vision,  positive ETCO2 and breath sounds checked- equal and bilateral Secured at: 22 cm Tube secured with: Tape Dental Injury: Teeth and Oropharynx as per pre-operative assessment

## 2019-11-25 NOTE — Progress Notes (Signed)
PHARMACY - TOTAL PARENTERAL NUTRITION CONSULT NOTE   Indication: prolonged PO intolerance, bowel rest  Patient Measurements: Height: 6' (182.9 cm) Weight: 70 kg (154 lb 5.2 oz) IBW/kg (Calculated) : 77.6 TPN AdjBW (KG): 72.6 Body mass index is 20.93 kg/m. Usual Weight: 78-82 kg prior to multiple hospital admissions/surgeries  Assessment: 44 yo male with colorectal cancer who underwent neoadjuvant chemo/radiation followed by low anterior resection 10/15/19 which was found to be complicated by breakdown of the rectal anastomosis with multiple associated intra-abdominal abscesses after patient presented on 10/31/2019 with abdominal pain. Pharmacy consulted to start TPN after inconsistent tolerance of diet. RLQ and R buttock drain in place.    Glucose / Insulin: No hx of DM. CBGs previously remained WNL on cyclic TPN at goal rate. No SSI ordered. Electrolytes: All WNL including CorrCa 9.7 (last labs 6/14) Renal: Scr & BUN stable WNL.  LFTs / TGs: AST/ALT WNL. TG 92 (6/14). Tbili wnl.  Prealbumin / albumin: prealbumin 12.2 (6/14) slightly decreased; Albumin 2.9 (6/14).  Intake / Output; MIVF: D5LR @ KVO. 58mL/24 hrs drain output; unmeasured UOP GI Imaging: - 5/21 CT A/P: anastomotic breakdown at rectal anastomosis w/ extraluminal stool; intra-abdominal abscess, SBO - 5/27 CT A/P: all abscesses reduced in size; concern for ileus or possibly distal SBO Surgeries / Procedures:  - 5/5 LAR (see above) - 5/22 CT-guided drain placement x 3 (2 in RLQ, 1 transgluteal): purulent fluid +/- fecal material removed from all drains - RLQ drain removed 5/28 - 6/1 transgluteal drain exchanged d/t cellulitis at drain site & clogging d/t fecal material - 6/15: Planning for OR today: fecal diversion, washout, and drain placement  Central access: PICC TPN start date: 5/28  Nutritional Goals (per RD recommendation on 6/9): kCal: 2200-2400, Protein: 105-120, Fluid: 2.2L  Goal TPN volume of 2160 mL (provides  119 g of protein, 302 g dextrose, and 2151 kcals per day)  Current Nutrition:  -NPO except for ice chips, sips with meds -TPN  Plan:  Now:  -CBG check ordered for this AM  At 1800:  Continue goal cyclic TPN of 2951OA over 12 hours (max glucose infusion rate 6.5 mg/kg/min)  Continued electrolytes in TPN with no changes    75 mEq Na, 55 mEq/L K, 5 mEq/L Ca, Mg 5 mEq/L,  Phos 15 mmol/L, Cl:Ac ratio 1:1  Continue standard MVI and trace elements in TPN  Check CBG once daily in the mornings  Continue M[facility] at [facility]  Monitor TPN labs on Mon/Thurs. Recheck electrolytes with AM labs tomorrow.  Lenis Noon, PharmD 11/25/19 10:31 AM

## 2019-11-25 NOTE — Anesthesia Preprocedure Evaluation (Addendum)
Anesthesia Evaluation  Patient identified by MRN, date of birth, ID band Patient awake    Reviewed: Allergy & Precautions, NPO status , Patient's Chart, lab work & pertinent test results  History of Anesthesia Complications Negative for: history of anesthetic complications  Airway Mallampati: I  TM Distance: >3 FB Neck ROM: Full    Dental no notable dental hx. (+) Teeth Intact, Dental Advisory Given   Pulmonary neg pulmonary ROS, sleep apnea and Continuous Positive Airway Pressure Ventilation , former smoker,    Pulmonary exam normal breath sounds clear to auscultation       Cardiovascular negative cardio ROS Normal cardiovascular exam Rhythm:Regular Rate:Normal     Neuro/Psych Anxiety negative neurological ROS  negative psych ROS   GI/Hepatic negative GI ROS, Neg liver ROS, Rectal ca, intraabdominal abscess   Endo/Other  negative endocrine ROS  Renal/GU negative Renal ROS  negative genitourinary   Musculoskeletal negative musculoskeletal ROS (+)   Abdominal   Peds negative pediatric ROS (+)  Hematology negative hematology ROS (+) anemia , Hgb 9.6   Anesthesia Other Findings Day of surgery medications reviewed with patient.  Reproductive/Obstetrics negative OB ROS                            Anesthesia Physical Anesthesia Plan  ASA: III  Anesthesia Plan: General   Post-op Pain Management:    Induction: Intravenous  PONV Risk Score and Plan: 4 or greater and Midazolam, Ondansetron, Dexamethasone and Treatment may vary due to age or medical condition  Airway Management Planned: Oral ETT  Additional Equipment: None  Intra-op Plan:   Post-operative Plan: Extubation in OR  Informed Consent:   Plan Discussed with: CRNA and Anesthesiologist  Anesthesia Plan Comments: (  )       Anesthesia Quick Evaluation

## 2019-11-25 NOTE — Op Note (Signed)
11/25/2019  3:32 PM  PATIENT:  Brandon Barry  44 y.o. male  Patient Care Team: [facility] [place] as PCP - General (Internal Medicine) Leighton Ruff, MD as Consulting Physician (Colon and Rectal Surgery) Carol Ada, MD as Consulting Physician (Gastroenterology) Ladell Pier, MD as Consulting Physician (Oncology)  PRE-OPERATIVE DIAGNOSIS:  INTRABDOMINAL ABSCESS  POST-OPERATIVE DIAGNOSIS:  ANASTOMOTIC DEHISCENCE  PROCEDURE:  EXPLORATOR LAPAROTMY WITH OPEN PELVIC WASHOUT APPLICATION OF WOUND VAC AND DRAIN PLACEMENT PLACEMENT OF DIVERTING LOOP ILEOSTOMY   Surgeon(s): Leighton Ruff, MD Michael Boston, MD  ASSISTANT: Dr Johney Maine   ANESTHESIA:   general  EBL: 75ml Total I/O In: 210 [I.V.:100; Other:10; IV Piggyback:100] Out: 900 [Urine:850; Blood:50]  DRAINS: (9F) Jackson-Pratt drain(s) with closed bulb suction in the lower abd and a 10F Red rubber catheter in the pelvis   SPECIMEN:  No Specimen  DISPOSITION OF SPECIMEN:  N/A  COUNTS:  YES  PLAN OF CARE: Pt already admitted  PATIENT DISPOSITION:  PACU - hemodynamically stable.  INDICATION: 44 y.o. M s/p LAR with late anastomotic leak   OR FINDINGS: dehiscence of ~75% of the anastomosis  DESCRIPTION: the patient was identified in the preoperative holding area and taken to the OR where they were laid supine on the operating room table.  General anesthesia was induced without difficulty. SCDs were also noted to be in place prior to the initiation of anesthesia.  The patient was then prepped and draped in the usual sterile fashion.   A surgical timeout was performed indicating the correct patient, procedure, positioning and need for preoperative antibiotics.   I began by making a lower midline incision using a 10 blade scalpel.  Dissection was carried down through the subcutaneous tissues using electrocautery.  The fascia was entered at midline.  The peritoneum was entered bluntly.  An abdominal wound  protector was placed.  The small bowel was adherent into the pelvis with dense adhesions.  I was able to bluntly break these with my fingers and I carefully brought up the small bowel from the pelvis intact.  There was some deserosalization of the terminal ileum.  The remaining small bowel appeared dilated but in good condition.  This was brought out of the abdomen and the entire small bowel was ran to the ligament of Treitz.  A few small adhesions were divided.  There was no sign of obstruction proximally.  The right lower quadrant CT-guided drain was removed.  I was then able to bluntly dissect into the pelvis and identify the anastomotic dehiscence and the transgluteal drain.  I placed a red rubber catheter (22 [place]) into the pelvis after cutting several drainage holes to allow for the passage of stool.  This was brought out through the right lower quadrant and secured into place with a 2-0 nylon suture.  There was minimal contamination of the abdomen but purulent material was identified in the pelvis at the site of the anastomotic dehiscence.  At this point the abdomen was irrigated with approximately 2 L of warm normal saline.  Hemostasis was good.  I inspected the right lower quadrant and there was no sign of active bleeding or injury to any surrounding structures.  I identified a portion of distal small bowel that would reach to his previously marked ostomy site, and this was brought out and secured over a 22 French red rubber loop catheter.  The bowel was then decompressed using a pool suction device.  There was a proximal serosal tear which appeared to have  occurred due to his bowel obstruction (chronic appearing). I decided to close this using interrupted 3-0 silk sutures.  All of his bowel was then placed back into the abdomen decompressed.  I had the CRNA place a NG tube as well.  I then placed a 80 [place] Blake drain into the pelvis and lower abdomen and brought this out through the left lower  quadrant port site.  This was secured into place with a 2-0 nylon suture as well.  I then closed the fascia using #1 Novafil sutures.  The skin was left open.  The ileostomy was matured in standard Brooke fashion using interrupted 3-0 Vicryl sutures.  A wound VAC was placed to the midline wound and an ostomy appliance was also placed.  The patient was then awakened from anesthesia and sent to the postanesthesia care unit in stable condition.  All counts were correct per operating room staff.

## 2019-11-25 NOTE — Progress Notes (Signed)
Pt has NG tube in place, pt states that he is going to hold off on CPAP QHS.  RT to monitor and assess as needed.

## 2019-11-26 ENCOUNTER — Encounter (HOSPITAL_COMMUNITY): Payer: Self-pay | Admitting: General Surgery

## 2019-11-26 LAB — BASIC METABOLIC PANEL
Anion gap: 9 (ref 5–15)
BUN: 20 mg/dL (ref 6–20)
CO2: 24 mmol/L (ref 22–32)
Calcium: 8.5 mg/dL — ABNORMAL LOW (ref 8.9–10.3)
Chloride: 102 mmol/L (ref 98–111)
Creatinine, Ser: 0.67 mg/dL (ref 0.61–1.24)
GFR calc Af Amer: >60 mL/min (ref >60)
GFR calc non Af Amer: >60 mL/min (ref >60)
Glucose, Bld: 295 mg/dL — ABNORMAL HIGH (ref 70–99)
Potassium: 4.5 mmol/L (ref 3.5–5.1)
Sodium: 135 mmol/L (ref 135–145)

## 2019-11-26 LAB — MAGNESIUM: Magnesium: 1.8 mg/dL (ref 1.7–2.4)

## 2019-11-26 LAB — PHOSPHORUS: Phosphorus: 3 mg/dL (ref 2.5–4.6)

## 2019-11-26 LAB — GLUCOSE, CAPILLARY: Glucose-Capillary: 120 mg/dL — ABNORMAL HIGH (ref 70–99)

## 2019-11-26 MED ORDER — ACETAMINOPHEN 10 MG/ML IV SOLN
1000.0000 mg | Freq: Four times a day (QID) | INTRAVENOUS | Status: AC
  Administered 2019-11-26 – 2019-11-27 (×4): 1000 mg via INTRAVENOUS
  Filled 2019-11-26 (×4): qty 100

## 2019-11-26 MED ORDER — PIPERACILLIN-TAZOBACTAM 3.375 G IVPB
3.3750 g | Freq: Three times a day (TID) | INTRAVENOUS | Status: DC
  Administered 2019-11-26 – 2019-12-01 (×15): 3.375 g via INTRAVENOUS
  Filled 2019-11-26 (×15): qty 50

## 2019-11-26 MED ORDER — MAGNESIUM SULFATE IN D5W 1-5 GM/100ML-% IV SOLN
1.0000 g | Freq: Once | INTRAVENOUS | Status: AC
  Administered 2019-11-26: 1 g via INTRAVENOUS
  Filled 2019-11-26: qty 100

## 2019-11-26 MED ORDER — TRAVASOL 10 % IV SOLN
INTRAVENOUS | Status: AC
  Filled 2019-11-26: qty 1188

## 2019-11-26 NOTE — Progress Notes (Signed)
1 Day Post-Op ex lap, pelvic washout, drain placement and diverting ileostomy Subjective: Expected post op pain, good uop  Objective: Vital signs in last 24 hours: Temp:  [97.9 F (36.6 C)-100.1 F (37.8 C)] 98.5 F (36.9 C) (06/16 0517) Pulse Rate:  [74-106] 105 (06/16 0517) Resp:  [14-22] 18 (06/16 0517) BP: (130-155)/(90-107) 130/90 (06/16 0517) SpO2:  [95 %-100 %] 96 % (06/16 0517)   Intake/Output from previous day: 06/15 0701 - 06/16 0700 In: 3691.5 [P.O.:90; I.V.:3491.5; IV Piggyback:100] Out: 8299 [Urine:2950; Emesis/NG output:550; Drains:945; Stool:50; Blood:50] Intake/Output this shift: Total I/O In: -  Out: 500 [Emesis/NG output:500]   General appearance: alert and cooperative GI: appropriately tender, mildly distended NG with dark bilious output Incision: wound vac in place Ostomy: beefy red  Lab Results:  Recent Labs    11/24/19 0633  WBC 5.6  HGB 9.6*  HCT 29.6*  PLT 397   BMET Recent Labs    11/24/19 0633 11/26/19 0615  NA 137 135  K 4.3 4.5  CL 101 102  CO2 25 24  GLUCOSE 109* 295*  BUN 18 20  CREATININE 0.60* 0.67  CALCIUM 8.8* 8.5*   PT/INR No results for input(s): LABPROT, INR in the last 72 hours. ABG No results for input(s): PHART, HCO3 in the last 72 hours.  Invalid input(s): PCO2, PO2  MEDS, Scheduled . acetaminophen  500 mg Oral TID WC & HS  . Chlorhexidine Gluconate Cloth  6 each Topical Daily  . enoxaparin (LOVENOX) injection  40 mg Subcutaneous Q24H  . gabapentin  900 mg Oral TID  . lip balm  1 application Topical BID  . metoprolol tartrate  12.5 mg Oral BID  . sodium chloride flush  5 mL Intracatheter Q8H  . Urelle  1 tablet Oral TID    Studies/Results: No results found.  Assessment: s/p Procedure(s): EXPLORATOR LAPAROTMY WITH OPEN PELVIC WASHOUT APPLICATION OF WOUND VAC AND DRAIN PLACEMENT PLACEMENT OF ILEOSTOMY Patient Active Problem List   Diagnosis Date Noted  . OSA on CPAP 11/01/2019  . Peripheral  neuropathy   . Hyperlipidemia   . Chronic back pain greater than 3 months duration   . Right shoulder pain   . Intraabdominal abscess 10/31/2019  . Hand foot syndrome w chemotherapy 04/04/2019  . Port-A-Cath in place 03/27/2019  . Cancer of rectum  ypT3, ypN0 s/p TNT & robotic LAR 10/15/2019 02/19/2019  . Pain in left knee 07/22/2018  . Anxiety state 03/18/2007  . DEPRESSION 03/18/2007  . Allergic rhinitis 03/18/2007    No unexpected issues  Plan: Cont NG, NPO Await return of bowel function   LOS: 25 days     .Rosario Adie, MD [facility], [place]    11/26/2019 8:43 AM

## 2019-11-26 NOTE — Progress Notes (Signed)
PHARMACY - TOTAL PARENTERAL NUTRITION CONSULT NOTE   Indication: prolonged PO intolerance, bowel rest  Patient Measurements: Height: 6' (182.9 cm) Weight: 70 kg (154 lb 5.2 oz) IBW/kg (Calculated) : 77.6 TPN AdjBW (KG): 72.6 Body mass index is 20.93 kg/m. Usual Weight: 78-82 kg prior to multiple hospital admissions/surgeries  Assessment: 44 yo male with colorectal cancer who underwent neoadjuvant chemo/radiation followed by low anterior resection 10/15/19 which was found to be complicated by breakdown of the rectal anastomosis with multiple associated intra-abdominal abscesses after patient presented on 10/31/2019 with abdominal pain. Pharmacy consulted to start TPN after inconsistent tolerance of diet.    Glucose / Insulin: No hx of DM. CBGs previously remained WNL on cyclic TPN at goal rate. No SSI ordered. -BG 120 now WNL.  -1 elevated BG of 295 likely 2/2 dexamethasone 8 mg IV once given on 6/15  Electrolytes: Mg (1.8) slightly low. All others WNL including CorrCa 9.4 Renal: Scr & BUN stable WNL.  LFTs / TGs: AST/ALT WNL. TG 92 (6/14). Tbili wnl.  Prealbumin / albumin: prealbumin 12.2 (6/14) slightly decreased; Albumin 2.9 (6/14).  Intake / Output; MIVF: D5LR @ 40 mL/hr. Good UOP (2950 mL); emesis/NG output 550 mL; Drains 945 mL output.  GI Imaging: - 5/21 CT A/P: anastomotic breakdown at rectal anastomosis w/ extraluminal stool; intra-abdominal abscess, SBO - 5/27 CT A/P: all abscesses reduced in size; concern for ileus or possibly distal SBO Surgeries / Procedures:  - 5/5 LAR (see above) - 5/22 CT-guided drain placement x 3 (2 in RLQ, 1 transgluteal): purulent fluid +/- fecal material removed from all drains - RLQ drain removed 5/28 - 6/1 transgluteal drain exchanged d/t cellulitis at drain site & clogging d/t fecal material - 6/15: Ex lap with open pelvic washout; application of wound vac and drain placement; placement of diverting loop ileostomy  Central access: PICC TPN  start date: 5/28  Nutritional Goals (per RD recommendation on 6/15): kCal: 2200-2400, Protein: 105-120, Fluid: 2.2L/day  Goal TPN volume of 2160 mL (provides 119 g of protein, 302 g dextrose, and 2151 kcals per day)  Current Nutrition:  -NPO except for ice chips, sips with meds -TPN  Plan:  Now:  -Mg sulfate 1 g IV once  At 1800:  Continue goal cyclic TPN of 4403KV over 12 hours (max glucose infusion rate 6.5 mg/kg/min)  Electrolytes in TPN: Reduce K, Increase Mg   75 mEq Na, 40 mEq/L K, 5 mEq/L Ca, Mg 6 mEq/L,  Phos 15 mmol/L, Cl:Ac ratio 1:1  Continue standard MVI and trace elements in TPN  Check CBG once daily in the mornings off of TPN. No SSI.  Continue MIVF per MD  Monitor TPN labs on Mon/Thurs. Recheck electrolytes with AM labs tomorrow.  Lenis Noon, PharmD 11/26/19 11:00 AM

## 2019-11-26 NOTE — Progress Notes (Signed)
Attempted to ambulate patient and he stated he would later after he gets some rest.

## 2019-11-26 NOTE — Progress Notes (Signed)
Patient declines the use of nocturnal CPAP tonight due to having a nasogastric tube in place. Equipment remains at the bedside. RT will continue to follow.

## 2019-11-27 LAB — COMPREHENSIVE METABOLIC PANEL
ALT: 16 U/L (ref 0–44)
AST: 15 U/L (ref 15–41)
Albumin: 2.7 g/dL — ABNORMAL LOW (ref 3.5–5.0)
Alkaline Phosphatase: 93 U/L (ref 38–126)
Anion gap: 10 (ref 5–15)
BUN: 24 mg/dL — ABNORMAL HIGH (ref 6–20)
CO2: 26 mmol/L (ref 22–32)
Calcium: 8.2 mg/dL — ABNORMAL LOW (ref 8.9–10.3)
Chloride: 100 mmol/L (ref 98–111)
Creatinine, Ser: 0.74 mg/dL (ref 0.61–1.24)
GFR calc Af Amer: >60 mL/min (ref >60)
GFR calc non Af Amer: >60 mL/min (ref >60)
Glucose, Bld: 153 mg/dL — ABNORMAL HIGH (ref 70–99)
Potassium: 4 mmol/L (ref 3.5–5.1)
Sodium: 136 mmol/L (ref 135–145)
Total Bilirubin: 0.6 mg/dL (ref 0.3–1.2)
Total Protein: 6.1 g/dL — ABNORMAL LOW (ref 6.5–8.1)

## 2019-11-27 LAB — CBC
HCT: 26.7 % — ABNORMAL LOW (ref 39.0–52.0)
Hemoglobin: 8.7 g/dL — ABNORMAL LOW (ref 13.0–17.0)
MCH: 28.9 pg (ref 26.0–34.0)
MCHC: 32.6 g/dL (ref 30.0–36.0)
MCV: 88.7 fL (ref 80.0–100.0)
Platelets: 340 10*3/uL (ref 150–400)
RBC: 3.01 MIL/uL — ABNORMAL LOW (ref 4.22–5.81)
RDW: 14 % (ref 11.5–15.5)
WBC: 12.5 10*3/uL — ABNORMAL HIGH (ref 4.0–10.5)
nRBC: 0 % (ref 0.0–0.2)

## 2019-11-27 LAB — PHOSPHORUS: Phosphorus: 3.1 mg/dL (ref 2.5–4.6)

## 2019-11-27 LAB — GLUCOSE, CAPILLARY: Glucose-Capillary: 96 mg/dL (ref 70–99)

## 2019-11-27 LAB — MAGNESIUM: Magnesium: 2.1 mg/dL (ref 1.7–2.4)

## 2019-11-27 MED ORDER — TRAVASOL 10 % IV SOLN
INTRAVENOUS | Status: AC
  Filled 2019-11-27: qty 1188

## 2019-11-27 NOTE — Progress Notes (Signed)
2 Days Post-Op ex lap, pelvic washout, drain placement and diverting ileostomy Subjective:  post op pain more controlled, good uop, no fevers, ostomy functioning  Objective: Vital signs in last 24 hours: Temp:  [97.4 F (36.3 C)-98.7 F (37.1 C)] 98.7 F (37.1 C) (06/17 0545) Pulse Rate:  [89-97] 93 (06/17 0545) Resp:  [16-20] 18 (06/17 0545) BP: (94-132)/(62-81) 120/74 (06/17 0545) SpO2:  [97 %-100 %] 99 % (06/17 0545)   Intake/Output from previous day: 06/16 0701 - 06/17 0700 In: 4824 [P.O.:300; I.V.:3867.1; IV Piggyback:646.9] Out: 6568 [Urine:3025; Emesis/NG output:1400; Drains:456; Stool:300] Intake/Output this shift: No intake/output data recorded.   General appearance: alert and cooperative GI: appropriately tender, mildly distended NG with dark bilious output Incision: wound vac in place Ostomy: beefy red, with bilious output  Lab Results:  Recent Labs    11/27/19 0442  WBC 12.5*  HGB 8.7*  HCT 26.7*  PLT 340   BMET Recent Labs    11/26/19 0615 11/27/19 0442  NA 135 136  K 4.5 4.0  CL 102 100  CO2 24 26  GLUCOSE 295* 153*  BUN 20 24*  CREATININE 0.67 0.74  CALCIUM 8.5* 8.2*   PT/INR No results for input(s): LABPROT, INR in the last 72 hours. ABG No results for input(s): PHART, HCO3 in the last 72 hours.  Invalid input(s): PCO2, PO2  MEDS, Scheduled . Chlorhexidine Gluconate Cloth  6 each Topical Daily  . enoxaparin (LOVENOX) injection  40 mg Subcutaneous Q24H  . gabapentin  900 mg Oral TID  . lip balm  1 application Topical BID  . metoprolol tartrate  12.5 mg Oral BID  . sodium chloride flush  5 mL Intracatheter Q8H  . Urelle  1 tablet Oral TID    Studies/Results: No results found.  Assessment: s/p Procedure(s): EXPLORATOR LAPAROTMY WITH OPEN PELVIC WASHOUT APPLICATION OF WOUND VAC AND DRAIN PLACEMENT PLACEMENT OF ILEOSTOMY Patient Active Problem List   Diagnosis Date Noted  . OSA on CPAP 11/01/2019  . Peripheral neuropathy   .  Hyperlipidemia   . Chronic back pain greater than 3 months duration   . Right shoulder pain   . Intraabdominal abscess 10/31/2019  . Hand foot syndrome w chemotherapy 04/04/2019  . Port-A-Cath in place 03/27/2019  . Cancer of rectum  ypT3, ypN0 s/p TNT & robotic LAR 10/15/2019 02/19/2019  . Pain in left knee 07/22/2018  . Anxiety state 03/18/2007  . DEPRESSION 03/18/2007  . Allergic rhinitis 03/18/2007    No unexpected issues  Plan: Clamp NG, NPO Possible d/c NG later today   LOS: 26 days     .Rosario Adie, MD [facility], [place]    11/27/2019 10:58 AM

## 2019-11-27 NOTE — Progress Notes (Signed)
PHARMACY - TOTAL PARENTERAL NUTRITION CONSULT NOTE   Indication: prolonged PO intolerance, bowel rest  Patient Measurements: Height: 6' (182.9 cm) Weight: 70 kg (154 lb 5.2 oz) IBW/kg (Calculated) : 77.6 TPN AdjBW (KG): 72.6 Body mass index is 20.93 kg/m. Usual Weight: 78-82 kg prior to multiple hospital admissions/surgeries  Assessment: 44 yo male with colorectal cancer who underwent neoadjuvant chemo/radiation followed by low anterior resection 10/15/19 which was found to be complicated by breakdown of the rectal anastomosis with multiple associated intra-abdominal abscesses after patient presented on 10/31/2019 with abdominal pain. Pharmacy consulted to start TPN after inconsistent tolerance of diet.    Glucose / Insulin: No hx of DM. CBGs previously remained WNL on goal cyclic TPN. No SSI ordered. Last AM blood glucose ok at 153. One elevated BG likely 2/2 dexamethasone 8 mg IV once given on 6/15 Electrolytes: WNL including CorrCa 9 Renal: Scr & BUN stable WNL.  LFTs / TGs: AST/ALT WNL. TG 92 (6/14). Tbili wnl.  Prealbumin / albumin: prealbumin 12.2 (6/14) slightly decreased; Albumin stable at 2.7 (6/17).  Intake / Output; MIVF: D5LR @ 40 mL/hr. Good UOP; emesis/NG output up to 1,400 mL; Drain output down to 456 mL output. 374mL of stool in last 24 hrs GI Imaging: - 5/21 CT A/P: anastomotic breakdown at rectal anastomosis w/ extraluminal stool; intra-abdominal abscess, SBO - 5/27 CT A/P: all abscesses reduced in size; concern for ileus or possibly distal SBO Surgeries / Procedures:  - 5/5 LAR (see above) - 5/22 CT-guided drain placement x 3 (2 in RLQ, 1 transgluteal): purulent fluid +/- fecal material removed from all drains - RLQ drain removed 5/28 - 6/1 transgluteal drain exchanged d/t cellulitis at drain site & clogging d/t fecal material - 6/15: Ex lap with open pelvic washout; application of wound vac and drain placement; placement of diverting loop ileostomy  Central access:  PICC TPN start date: 5/28  Nutritional Goals (per RD recommendation on 6/15): kCal: 2200-2400, Protein: 105-120, Fluid: 2.2L/day  Goal TPN volume of 2160 mL (provides 119 g of protein, 302 g dextrose, and 2151 kcals per day)  Current Nutrition:  -NPO except for ice chips, sips with meds -TPN  Plan:  At 1800: Continue goal cyclic TPN of 9532YE over 12 hours (max glucose infusion rate 6.5 mg/kg/min) Electrolytes in TPN: 75 mEq Na, 40 mEq/L K, 5 mEq/L Ca, Mg 6 mEq/L,  Phos 15 mmol/L, Cl:Ac ratio 1:1 Continue standard MVI and trace elements in TPN Check CBG once daily in the mornings. Will consider adding back CBG checks if AM glucose remains above 150. Currently no SSI. Continue MIVF per MD Monitor TPN labs on Mon/Thurs.  Elenor Quinones, PharmD, BCPS, BCIDP Clinical Pharmacist 11/27/2019 7:46 AM

## 2019-11-27 NOTE — Consult Note (Signed)
[place] Nurse ostomy follow up Stoma type/location: RLQ; Loop ileostomy Stomal assessment/size: 2 1/4" edematous, pink, moist, red rubber support rod in place Peristomal assessment: NA Treatment options for stomal/peristomal skin: NA Output liquid green Ostomy pouching: 1pc. Flat in place from the OR Education provided:  Met with patient and his wife Explained role of ostomy nurse and creation of stoma  Explained stoma characteristics (budded, flush, color, texture, care) Explained loop ostomy vs end ostomy and rationale for red rubber catheter Education on emptying when 1/3 to 1/2 full and how to empty; demonstrated emptying and use of wick to clean spout. Rationale for using wick.  Discussed bathing, diet, gas Because of proximity of ostomy to the NPWT dressing will plan to change both tomorrow.  NPWT dressings ordered. Ostomy supplies in the room Enrolled patient in [place] Start Discharge program: Yes  [place] Nurse will follow along with you for continued support with ostomy teaching and care [place] MSN, [place], [place], [place], [place]

## 2019-11-28 LAB — CBC
HCT: 27.2 % — ABNORMAL LOW (ref 39.0–52.0)
Hemoglobin: 8.7 g/dL — ABNORMAL LOW (ref 13.0–17.0)
MCH: 28.2 pg (ref 26.0–34.0)
MCHC: 32 g/dL (ref 30.0–36.0)
MCV: 88.3 fL (ref 80.0–100.0)
Platelets: 363 10*3/uL (ref 150–400)
RBC: 3.08 MIL/uL — ABNORMAL LOW (ref 4.22–5.81)
RDW: 14.1 % (ref 11.5–15.5)
WBC: 10.7 10*3/uL — ABNORMAL HIGH (ref 4.0–10.5)
nRBC: 0 % (ref 0.0–0.2)

## 2019-11-28 MED ORDER — TRAVASOL 10 % IV SOLN
INTRAVENOUS | Status: AC
  Filled 2019-11-28: qty 1188

## 2019-11-28 MED ORDER — HYDROMORPHONE HCL 1 MG/ML IJ SOLN
1.0000 mg | INTRAMUSCULAR | Status: DC | PRN
  Administered 2019-11-28 – 2019-12-01 (×17): 2 mg via INTRAVENOUS
  Filled 2019-11-28 (×17): qty 2

## 2019-11-28 NOTE — Plan of Care (Signed)
  Problem: Activity: Goal: Risk for activity intolerance will decrease Outcome: Progressing   Problem: Pain Managment: Goal: General experience of comfort will improve Outcome: Progressing   

## 2019-11-28 NOTE — Progress Notes (Signed)
PHARMACY - TOTAL PARENTERAL NUTRITION CONSULT NOTE   Indication: prolonged PO intolerance, bowel rest  Patient Measurements: Height: 6' (182.9 cm) Weight: 66.3 kg (146 lb 2.6 oz) IBW/kg (Calculated) : 77.6 TPN AdjBW (KG): 72.6 Body mass index is 19.82 kg/m. Usual Weight: 78-82 kg prior to multiple hospital admissions/surgeries  Assessment: 44 yo male with colorectal cancer who underwent neoadjuvant chemo/radiation followed by low anterior resection 10/15/19 which was found to be complicated by breakdown of the rectal anastomosis with multiple associated intra-abdominal abscesses after patient presented on 10/31/2019 with abdominal pain. Pharmacy consulted to start TPN after inconsistent tolerance of diet.    Glucose / Insulin: No hx of DM. CBGs previously remained WNL on goal cyclic TPN. No SSI ordered. Last AM blood glucose ok at 96. One elevated BG likely 2/2 dexamethasone 8 mg IV once given on 6/15 Electrolytes: WNL including CorrCa 9 Renal: Scr & BUN stable WNL.  LFTs / TGs: AST/ALT WNL. TG 92 (6/14). Tbili wnl.  Prealbumin / albumin: prealbumin 12.2 (6/14) slightly decreased; Albumin stable at 2.7 (6/17).  Intake / Output; MIVF: D5LR @ 40 mL/hr. Good UOP; NG tube taken out on 6/17; Drain output down to 150 mL output. 471mL of stool in last 24 hrs GI Imaging: - 5/21 CT A/P: anastomotic breakdown at rectal anastomosis w/ extraluminal stool; intra-abdominal abscess, SBO - 5/27 CT A/P: all abscesses reduced in size; concern for ileus or possibly distal SBO Surgeries / Procedures:  - 5/5 LAR (see above) - 5/22 CT-guided drain placement x 3 (2 in RLQ, 1 transgluteal): purulent fluid +/- fecal material removed from all drains - RLQ drain removed 5/28 - 6/1 transgluteal drain exchanged d/t cellulitis at drain site & clogging d/t fecal material - 6/15: Ex lap with open pelvic washout; application of wound vac and drain placement; placement of diverting loop ileostomy  Central access:  PICC TPN start date: 5/28  Nutritional Goals (per RD recommendation on 6/15): kCal: 2200-2400, Protein: 105-120, Fluid: 2.2L/day  Goal TPN volume of 2160 mL (provides 119 g of protein, 302 g dextrose, and 2151 kcals per day)  Current Nutrition:  -NPO except for ice chips, sips with meds -TPN  Plan:  At 1800: Continue goal cyclic TPN of 5681EX over 12 hours (max glucose infusion rate 6.5 mg/kg/min) Electrolytes in TPN: 75 mEq Na, 40 mEq/L K, 5 mEq/L Ca, Mg 6 mEq/L,  Phos 15 mmol/L, Cl:Ac ratio 1:1 Continue standard MVI and trace elements in TPN Check CBG once daily in the mornings. Currently no SSI. Continue MIVF per MD  Monitor TPN labs on Mon/Thurs.  Elenor Quinones, PharmD, BCPS, BCIDP Clinical Pharmacist 11/28/2019 10:04 AM

## 2019-11-28 NOTE — Progress Notes (Signed)
During shift, patient's pain was 7/10. Patient's pain has decreased with IV Dilaudid to 3/10. Made a goal with patient to manage pain with oral Oxycodone during shift. However, after oral Oxycodone 15 mg was given, pain went up to 8/10. Patient has ambulated twice during this shift so far. Patient has agreed to speak with Dr. [name] today/this morning during rounds about an alternative oral pain med option.

## 2019-11-28 NOTE — Consult Note (Signed)
[place] Nurse ostomy follow up Stoma type/location: RLQ, loop ileostomy Stomal assessment/size: 2 1/4" x 2 1/4" loop with red rubber in place. Pink, moist, edematous  Peristomal assessment: intact  Treatment options for stomal/peristomal skin: none Output liquid green Ostomy pouching: 2pc. 2 3/4"  Education provided: Continued discussion about  stoma characteristics (budded, flush, color, texture, care) Demonstrated pouch change (cutting new skin barrier, measuring stoma, cleaning peristomal skin and stoma). Explained that we will not need to navigate the support catheter next week because it will be removed  Education on emptying when 1/3 to 1/2 full and how to empty Patient discussed with [place] nurse again use of wick to clean spout  Discussed bathing, diet, gas, medication use Discussed treatment of peristomal skin (ostomy powder, skin barrier wipes) Enrolled patient in [place] Start Discharge program: Yes  [place] Nurse wound follow up Wound type: midline incision  Measurement: 6cm x 1.5cm x 0.7cm  Wound JLL:[id number] tissue, moist, pink  Drainage (amount, consistency, odor) scant in canister Periwound:intact  Dressing procedure/placement/frequency: Removed old NPWT dressing using skin adhesive remover.  Patient is extremely sensitive to any pain.  Filled wound with  1___ piece of black foam Sealed NPWT dressing at 177mm HG Patient received IV pain medication per bedside nurse prior to dressing change Patient tolerated procedure well  [place] nurse will continue to provide NPWT dressing changed due to the proximity of the stoma and the dressing      [place] Nurse will follow along with you for continued support with ostomy teaching and care [place] MSN, [place], [place], [place], [place]

## 2019-11-28 NOTE — Progress Notes (Signed)
3 Days Post-Op ex lap, pelvic washout, drain placement and diverting ileostomy Subjective:  post op pain more controlled, adequate uop, no fevers, ostomy functioning, NG removed  Objective: Vital signs in last 24 hours: Temp:  [97.6 F (36.4 C)-99 F (37.2 C)] 98.5 F (36.9 C) (06/18 0552) Pulse Rate:  [67-78] 78 (06/18 0552) Resp:  [16-18] 18 (06/18 0552) BP: (112-138)/(65-86) 138/86 (06/18 0552) SpO2:  [98 %-100 %] 100 % (06/18 0552) Weight:  [66.3 kg] 66.3 kg (06/18 0739)   Intake/Output from previous day: 06/17 0701 - 06/18 0700 In: 1902.8 [P.O.:120; I.V.:1712.8; NG/GT:20; IV Piggyback:50] Out: 1600 [Urine:1000; Drains:150; Stool:450] Intake/Output this shift: Total I/O In: -  Out: 20 [Drains:20]   General appearance: alert and cooperative GI: appropriately tender, mildly distended NG out Incision: wound vac in place Ostomy: beefy red, with bilious output  Lab Results:  Recent Labs    11/27/19 0442 11/28/19 0623  WBC 12.5* 10.7*  HGB 8.7* 8.7*  HCT 26.7* 27.2*  PLT 340 363   BMET Recent Labs    11/26/19 0615 11/27/19 0442  NA 135 136  K 4.5 4.0  CL 102 100  CO2 24 26  GLUCOSE 295* 153*  BUN 20 24*  CREATININE 0.67 0.74  CALCIUM 8.5* 8.2*   PT/INR No results for input(s): LABPROT, INR in the last 72 hours. ABG No results for input(s): PHART, HCO3 in the last 72 hours.  Invalid input(s): PCO2, PO2  MEDS, Scheduled . Chlorhexidine Gluconate Cloth  6 each Topical Daily  . enoxaparin (LOVENOX) injection  40 mg Subcutaneous Q24H  . gabapentin  900 mg Oral TID  . lip balm  1 application Topical BID  . metoprolol tartrate  12.5 mg Oral BID  . sodium chloride flush  5 mL Intracatheter Q8H  . Urelle  1 tablet Oral TID    Studies/Results: No results found.  Assessment: s/p Procedure(s): EXPLORATOR LAPAROTMY WITH OPEN PELVIC WASHOUT APPLICATION OF WOUND VAC AND DRAIN PLACEMENT PLACEMENT OF ILEOSTOMY Patient Active Problem List   Diagnosis  Date Noted  . OSA on CPAP 11/01/2019  . Peripheral neuropathy   . Hyperlipidemia   . Chronic back pain greater than 3 months duration   . Right shoulder pain   . Intraabdominal abscess 10/31/2019  . Hand foot syndrome w chemotherapy 04/04/2019  . Port-A-Cath in place 03/27/2019  . Cancer of rectum  ypT3, ypN0 s/p TNT & robotic LAR 10/15/2019 02/19/2019  . Pain in left knee 07/22/2018  . Anxiety state 03/18/2007  . DEPRESSION 03/18/2007  . Allergic rhinitis 03/18/2007    No unexpected issues  Plan: Advance diet as tolerated Wean to PO pain meds Ok to leave floor  Wean TPN if tolerates liquids   LOS: 27 days     .Rosario Adie, [facility], [place]    11/28/2019 11:02 AM

## 2019-11-29 LAB — CBC
HCT: 25.9 % — ABNORMAL LOW (ref 39.0–52.0)
Hemoglobin: 8.2 g/dL — ABNORMAL LOW (ref 13.0–17.0)
MCH: 28.5 pg (ref 26.0–34.0)
MCHC: 31.7 g/dL (ref 30.0–36.0)
MCV: 89.9 fL (ref 80.0–100.0)
Platelets: 381 10*3/uL (ref 150–400)
RBC: 2.88 MIL/uL — ABNORMAL LOW (ref 4.22–5.81)
RDW: 13.8 % (ref 11.5–15.5)
WBC: 8.2 10*3/uL (ref 4.0–10.5)
nRBC: 0 % (ref 0.0–0.2)

## 2019-11-29 MED ORDER — IBUPROFEN 400 MG PO TABS: 600.0000 mg | ORAL_TABLET | Freq: Four times a day (QID) | ORAL | Status: DC | PRN

## 2019-11-29 MED ORDER — PSYLLIUM 95 % PO PACK
1.0000 | PACK | Freq: Every day | ORAL | Status: DC
  Administered 2019-11-29 – 2019-12-02 (×4): 1 via ORAL
  Filled 2019-11-29 (×5): qty 1

## 2019-11-29 MED ORDER — ACETAMINOPHEN 500 MG PO TABS
1000.0000 mg | ORAL_TABLET | Freq: Four times a day (QID) | ORAL | Status: DC
  Administered 2019-11-29 – 2019-12-03 (×13): 1000 mg via ORAL
  Filled 2019-11-29 (×14): qty 2

## 2019-11-29 MED ORDER — ADULT MULTIVITAMIN W/MINERALS CH
1.0000 | ORAL_TABLET | Freq: Every day | ORAL | Status: DC
  Administered 2019-11-29 – 2019-12-03 (×5): 1 via ORAL
  Filled 2019-11-29 (×5): qty 1

## 2019-11-29 MED ORDER — TRAVASOL 10 % IV SOLN
INTRAVENOUS | Status: AC
  Filled 2019-11-29: qty 605

## 2019-11-29 NOTE — Progress Notes (Addendum)
PHARMACY - TOTAL PARENTERAL NUTRITION CONSULT NOTE   Indication: prolonged PO intolerance, bowel rest  Patient Measurements: Height: 6' (182.9 cm) Weight: 66.3 kg (146 lb 2.6 oz) IBW/kg (Calculated) : 77.6 TPN AdjBW (KG): 72.6 Body mass index is 19.82 kg/m. Usual Weight: 78-82 kg prior to multiple hospital admissions/surgeries  Assessment: 44 yo male with colorectal cancer who underwent neoadjuvant chemo/radiation followed by low anterior resection 10/15/19 which was found to be complicated by breakdown of the rectal anastomosis with multiple associated intra-abdominal abscesses after patient presented on 10/31/2019 with abdominal pain. Pharmacy consulted to start TPN after inconsistent tolerance of diet.    Glucose / Insulin: No hx of DM. CBGs previously remained WNL on goal cyclic TPN. No SSI ordered. Last AM blood glucose ok at 96. One elevated BG likely 2/2 dexamethasone 8 mg IV once given on 6/15 Electrolytes: no labs today, WNL 6/17 Renal: Scr & BUN stable WNL.  LFTs / TGs: AST/ALT WNL. TG 92 (6/14). Tbili wnl.  Prealbumin / albumin: prealbumin 12.2 (6/14) slightly decreased; Albumin stable at 2.7 (6/17).  Intake / Output; MIVF: D5LR @ 40 mL/hr. Good UOP; NG tube taken out on 6/17; Drain output down to 150 mL output. 460mL of stool in last 24 hrs GI Imaging: - 5/21 CT A/P: anastomotic breakdown at rectal anastomosis w/ extraluminal stool; intra-abdominal abscess, SBO - 5/27 CT A/P: all abscesses reduced in size; concern for ileus or possibly distal SBO Surgeries / Procedures:  - 5/5 LAR (see above) - 5/22 CT-guided drain placement x 3 (2 in RLQ, 1 transgluteal): purulent fluid +/- fecal material removed from all drains - RLQ drain removed 5/28 - 6/1 transgluteal drain exchanged d/t cellulitis at drain site & clogging d/t fecal material - 6/15: Ex lap with open pelvic washout; application of wound vac and drain placement; placement of diverting loop ileostomy  Central access:  PICC TPN start date: 5/28  Nutritional Goals (per RD recommendation on 6/15): kCal: 2200-2400, Protein: 105-120, Fluid: 2.2L/day  Goal TPN volume of 2160 mL (provides 119 g of protein, 302 g dextrose, and 2151 kcals per day)  Current Nutrition:  -FLD advanced to soft diet 1/27 -cyclic TPN  Plan:  d/w CCS: cut rate in 1/2 today At 5170: reduce cyclic TPN from  0174BS to 1100 ml over 12 hours  Electrolytes in TPN: 75 mEq Na, 40 mEq/L K, 5 mEq/L Ca, Mg 6 mEq/L,  Phos 15 mmol/L, Cl:Ac ratio 1:1 Change MVI to PO Continue MIVF per MD  Monitor TPN labs on Mon/Thurs.  Eudelia Bunch, Pharm.D 11/29/2019 9:05 AM

## 2019-11-29 NOTE — Progress Notes (Signed)
4 Days Post-Op ex lap, pelvic washout, drain placement and diverting ileostomy Subjective: Doing well and in good spirits. Wife at bedside. Denies n/v. Pain controlled - 'much better' since having ileostomy.  Objective: Vital signs in last 24 hours: Temp:  [97.5 F (36.4 C)-98.8 F (37.1 C)] 97.5 F (36.4 C) (06/19 0601) Pulse Rate:  [69-73] 69 (06/19 0601) Resp:  [18] 18 (06/19 0601) BP: (115-129)/(80-87) 115/80 (06/19 0601) SpO2:  [98 %-100 %] 98 % (06/19 0601)   Intake/Output from previous day: 06/18 0701 - 06/19 0700 In: 3852.3 [P.O.:1140; I.V.:2463.6; IV Piggyback:248.7] Out: 1355 [Urine:675; Drains:130; Stool:550] Intake/Output this shift: Total I/O In: 331.8 [P.O.:120; I.V.:176.8; IV Piggyback:35] Out: 155 [Drains:30; Stool:125]   General appearance: alert and cooperative GI: appropriately tender, mildly distended JPs with expected purulent/feculent output NG out Incision: wound vac in place Ostomy: beefy red, with thin bilious output  Lab Results:  Recent Labs    11/28/19 0623 11/29/19 0328  WBC 10.7* 8.2  HGB 8.7* 8.2*  HCT 27.2* 25.9*  PLT 363 381   BMET Recent Labs    11/27/19 0442  NA 136  K 4.0  CL 100  CO2 26  GLUCOSE 153*  BUN 24*  CREATININE 0.74  CALCIUM 8.2*   PT/INR No results for input(s): LABPROT, INR in the last 72 hours. ABG No results for input(s): PHART, HCO3 in the last 72 hours.  Invalid input(s): PCO2, PO2  MEDS, Scheduled . Chlorhexidine Gluconate Cloth  6 each Topical Daily  . enoxaparin (LOVENOX) injection  40 mg Subcutaneous Q24H  . gabapentin  900 mg Oral TID  . lip balm  1 application Topical BID  . metoprolol tartrate  12.5 mg Oral BID  . multivitamin with minerals  1 tablet Oral Daily  . psyllium  1 packet Oral Daily  . sodium chloride flush  5 mL Intracatheter Q8H  . Urelle  1 tablet Oral TID    Studies/Results: No results found.  Assessment: s/p Procedure(s): EXPLORATOR LAPAROTMY WITH OPEN PELVIC  WASHOUT APPLICATION OF WOUND VAC AND DRAIN PLACEMENT PLACEMENT OF ILEOSTOMY Patient Active Problem List   Diagnosis Date Noted  . OSA on CPAP 11/01/2019  . Peripheral neuropathy   . Hyperlipidemia   . Chronic back pain greater than 3 months duration   . Right shoulder pain   . Intraabdominal abscess 10/31/2019  . Hand foot syndrome w chemotherapy 04/04/2019  . Port-A-Cath in place 03/27/2019  . Cancer of rectum  ypT3, ypN0 s/p TNT & robotic LAR 10/15/2019 02/19/2019  . Pain in left knee 07/22/2018  . Anxiety state 03/18/2007  . DEPRESSION 03/18/2007  . Allergic rhinitis 03/18/2007     Plan: - Afebrile, WBC normalized today - Soft diet - Discussed importance of monitoring ostomy output accurately and things to expect at home - <1.2 L/24hrs being goal... toothpaste consistency. - Ambulate - Wean to PO pain meds - Ok to leave floor  - 1/2 rate TPN today; if tolerating diet today, will discontinue TPN altogether tomorrow. Discussed plan with pharmacy   LOS: 28 days   Nadeen Landau, M.D. [facility] Use [email or web address] to contact on call provider  11/29/2019 10:54 AM

## 2019-11-29 NOTE — Plan of Care (Signed)
  Problem: Clinical Measurements: Goal: Ability to maintain clinical measurements within normal limits will improve Outcome: Progressing Goal: Will remain free from infection Outcome: Progressing Goal: Diagnostic test results will improve Outcome: Progressing Goal: Respiratory complications will improve Outcome: Progressing Goal: Cardiovascular complication will be avoided Outcome: Progressing   Problem: Activity: Goal: Risk for activity intolerance will decrease Outcome: Progressing   Problem: Nutrition: Goal: Adequate nutrition will be maintained Outcome: Progressing   Problem: Coping: Goal: Level of anxiety will decrease Outcome: Progressing   Problem: Elimination: Goal: Will not experience complications related to bowel motility Outcome: Progressing Goal: Will not experience complications related to urinary retention Outcome: Progressing   Problem: Pain Managment: Goal: General experience of comfort will improve Outcome: Progressing   Problem: Safety: Goal: Ability to remain free from injury will improve Outcome: Progressing   Problem: Skin Integrity: Goal: Risk for impaired skin integrity will decrease Outcome: Progressing   Problem: Pain Management: Goal: Satisfaction with pain management regimen will be met by discharge Outcome: Progressing

## 2019-11-30 LAB — BASIC METABOLIC PANEL
Anion gap: 10 (ref 5–15)
BUN: 18 mg/dL (ref 6–20)
CO2: 28 mmol/L (ref 22–32)
Calcium: 9 mg/dL (ref 8.9–10.3)
Chloride: 99 mmol/L (ref 98–111)
Creatinine, Ser: 0.68 mg/dL (ref 0.61–1.24)
GFR calc Af Amer: >60 mL/min (ref >60)
GFR calc non Af Amer: >60 mL/min (ref >60)
Glucose, Bld: 127 mg/dL — ABNORMAL HIGH (ref 70–99)
Potassium: 4 mmol/L (ref 3.5–5.1)
Sodium: 137 mmol/L (ref 135–145)

## 2019-11-30 MED ORDER — LOPERAMIDE HCL 2 MG PO CAPS
2.0000 mg | ORAL_CAPSULE | Freq: Every day | ORAL | Status: DC
  Administered 2019-11-30 – 2019-12-01 (×2): 2 mg via ORAL
  Filled 2019-11-30 (×2): qty 1

## 2019-11-30 NOTE — Progress Notes (Signed)
Pharmacy TPN note  Tolerating diet  Plan: DC TPN per Dr Brantley Stage  Eudelia Bunch, Pharm.D 11/30/2019 10:00 AM

## 2019-11-30 NOTE — Progress Notes (Signed)
5 Days Post-Op   Subjective/Chief Complaint: WALKING HALLS  DOING WELL  Wants more to eat    Objective: Vital signs in last 24 hours: Temp:  [97.4 F (36.3 C)-98.7 F (37.1 C)] 98 F (36.7 C) (06/20 0610) Pulse Rate:  [57-73] 57 (06/20 0610) Resp:  [16-18] 16 (06/20 0610) BP: (119-132)/(67-89) 119/67 (06/20 0610) SpO2:  [99 %-100 %] 99 % (06/20 0610) Last BM Date: 11/29/19  Intake/Output from previous day: 06/19 0701 - 06/20 0700 In: 2107.8 [P.O.:600; I.V.:1307.8; IV Piggyback:200] Out: 8099 [IPJAS:5053; Drains:140; ZJQBH:4193] Intake/Output this shift: No intake/output data recorded.   General appearance: alert and cooperative GI: appropriately tender, mildly distended JPs with expected purulent/feculent output NG out Incision: wound vac in place Ostomy: beefy red, with thin bilious output Lab Results:  Recent Labs    11/28/19 0623 11/29/19 0328  WBC 10.7* 8.2  HGB 8.7* 8.2*  HCT 27.2* 25.9*  PLT 363 381   BMET Recent Labs    11/30/19 0318  NA 137  K 4.0  CL 99  CO2 28  GLUCOSE 127*  BUN 18  CREATININE 0.68  CALCIUM 9.0   PT/INR No results for input(s): LABPROT, INR in the last 72 hours. ABG No results for input(s): PHART, HCO3 in the last 72 hours.  Invalid input(s): PCO2, PO2  Studies/Results: No results found.  Anti-infectives: Anti-infectives (From admission, onward)   Start     Dose/Rate Route Frequency Ordered Stop   11/26/19 1000  piperacillin-tazobactam (ZOSYN) IVPB 3.375 g     Discontinue     3.375 g 12.5 mL/hr over 240 Minutes Intravenous Every 8 hours 11/26/19 0850     11/25/19 1316  piperacillin-tazobactam (ZOSYN) 3.375 GM/50ML IVPB       Note to Pharmacy: Randa Evens  : cabinet override      11/25/19 1316 11/26/19 0129   11/25/19 1300  cefoTEtan (CEFOTAN) 2 g in sodium chloride 0.9 % 100 mL IVPB        2 g 200 mL/hr over 30 Minutes Intravenous On call to O.R. 11/25/19 0912 11/25/19 1340   11/21/19 1100   Ampicillin-Sulbactam (UNASYN) 3 g in sodium chloride 0.9 % 100 mL IVPB  Status:  Discontinued        3 g 200 mL/hr over 30 Minutes Intravenous Every 6 hours 11/21/19 1018 11/25/19 0912   11/20/19 1000  Urelle (URELLE/URISED) 81 MG tablet 81 mg     Discontinue     1 tablet Oral 3 times daily 11/20/19 0831     11/03/19 1000  anidulafungin (ERAXIS) 100 mg in sodium chloride 0.9 % 100 mL IVPB  Status:  Discontinued        100 mg 78 mL/hr over 100 Minutes Intravenous Every 24 hours 11/02/19 0658 11/10/19 0802   11/02/19 0800  anidulafungin (ERAXIS) 200 mg in sodium chloride 0.9 % 200 mL IVPB        200 mg 78 mL/hr over 200 Minutes Intravenous  Once 11/02/19 0704 11/02/19 1138   10/31/19 1800  piperacillin-tazobactam (ZOSYN) IVPB 3.375 g  Status:  Discontinued        3.375 g 12.5 mL/hr over 240 Minutes Intravenous Every 8 hours 10/31/19 1719 11/10/19 0802      Assessment/Plan:  - Regular DIET  Try imodium 2 mg daily for output  - Discussed importance of monitoring ostomy output accurately and things to expect at home - <1.2 L/24hrs being goal... toothpaste consistency. - Ambulate - Wean to PO pain meds - Ok to leave  floor   LOS: 29 days    Turner Daniels MD 11/30/2019

## 2019-12-01 ENCOUNTER — Encounter: Payer: Commercial Managed Care - PPO | Admitting: Physical Therapy

## 2019-12-01 MED ORDER — HYDROMORPHONE HCL 1 MG/ML IJ SOLN
1.0000 mg | INTRAMUSCULAR | Status: DC | PRN
  Administered 2019-12-01 – 2019-12-02 (×6): 1 mg via INTRAVENOUS
  Filled 2019-12-01 (×6): qty 1

## 2019-12-01 NOTE — Progress Notes (Signed)
6 Days Post-Op ex lap, pelvic washout, drain placement and diverting ileostomy Subjective:  post op pain more controlled, adequate uop and ostomy output, no fevers, tolerating a diet, off TPN  Objective: Vital signs in last 24 hours: Temp:  [97.5 F (36.4 C)-98.6 F (37 C)] 98.6 F (37 C) (06/21 0621) Pulse Rate:  [68-76] 68 (06/21 0621) Resp:  [16-18] 16 (06/21 0621) BP: (113-128)/(77-85) 113/77 (06/21 0621) SpO2:  [99 %-100 %] 99 % (06/21 0621)   Intake/Output from previous day: 06/20 0701 - 06/21 0700 In: 1303.2 [P.O.:1080; I.V.:10; IV Piggyback:213.2] Out: 1175 [Urine:550; Drains:75; Stool:550] Intake/Output this shift: No intake/output data recorded.   General appearance: alert and cooperative GI: appropriately tender, mildly distended NG out Incision: wound vac in place Ostomy: beefy red, with bilious output  Lab Results:  Recent Labs    11/29/19 0328  WBC 8.2  HGB 8.2*  HCT 25.9*  PLT 381   BMET Recent Labs    11/30/19 0318  NA 137  K 4.0  CL 99  CO2 28  GLUCOSE 127*  BUN 18  CREATININE 0.68  CALCIUM 9.0   PT/INR No results for input(s): LABPROT, INR in the last 72 hours. ABG No results for input(s): PHART, HCO3 in the last 72 hours.  Invalid input(s): PCO2, PO2  MEDS, Scheduled . acetaminophen  1,000 mg Oral Q6H  . Chlorhexidine Gluconate Cloth  6 each Topical Daily  . enoxaparin (LOVENOX) injection  40 mg Subcutaneous Q24H  . gabapentin  900 mg Oral TID  . lip balm  1 application Topical BID  . loperamide  2 mg Oral Daily  . metoprolol tartrate  12.5 mg Oral BID  . multivitamin with minerals  1 tablet Oral Daily  . psyllium  1 packet Oral Daily  . sodium chloride flush  5 mL Intracatheter Q8H    Studies/Results: No results found.  Assessment: s/p Procedure(s): EXPLORATOR LAPAROTMY WITH OPEN PELVIC WASHOUT APPLICATION OF WOUND VAC AND DRAIN PLACEMENT PLACEMENT OF ILEOSTOMY Patient Active Problem List   Diagnosis Date Noted  .  OSA on CPAP 11/01/2019  . Peripheral neuropathy   . Hyperlipidemia   . Chronic back pain greater than 3 months duration   . Right shoulder pain   . Intraabdominal abscess 10/31/2019  . Hand foot syndrome w chemotherapy 04/04/2019  . Port-A-Cath in place 03/27/2019  . Cancer of rectum  ypT3, ypN0 s/p TNT & robotic LAR 10/15/2019 02/19/2019  . Pain in left knee 07/22/2018  . Anxiety state 03/18/2007  . DEPRESSION 03/18/2007  . Allergic rhinitis 03/18/2007    No unexpected issues  Plan: Advance diet as tolerated D/c JP SL IVF's Wean to PO pain meds Ok to leave floor     LOS: 30 days     .Rosario Adie, MD [facility], [place]    12/01/2019 9:27 AM

## 2019-12-01 NOTE — Progress Notes (Signed)
Placed pt. on CPAP @ this time.

## 2019-12-02 MED ORDER — DIPHENHYDRAMINE HCL 12.5 MG/5ML PO ELIX
12.5000 mg | ORAL_SOLUTION | Freq: Four times a day (QID) | ORAL | Status: DC | PRN
  Administered 2019-12-02: 12.5 mg via ORAL
  Administered 2019-12-02: 25 mg via ORAL
  Filled 2019-12-02: qty 10
  Filled 2019-12-02: qty 5

## 2019-12-02 MED ORDER — OXYCODONE HCL 5 MG PO TABS
5.0000 mg | ORAL_TABLET | Freq: Four times a day (QID) | ORAL | Status: DC | PRN
  Administered 2019-12-02 (×2): 10 mg via ORAL
  Administered 2019-12-02: 5 mg via ORAL
  Administered 2019-12-03 (×2): 10 mg via ORAL
  Filled 2019-12-02 (×5): qty 2

## 2019-12-02 MED ORDER — LOPERAMIDE HCL 2 MG PO CAPS
2.0000 mg | ORAL_CAPSULE | Freq: Two times a day (BID) | ORAL | Status: DC
  Administered 2019-12-02 – 2019-12-03 (×2): 2 mg via ORAL
  Filled 2019-12-02 (×2): qty 1

## 2019-12-02 MED ORDER — HYDROMORPHONE HCL 1 MG/ML IJ SOLN
1.0000 mg | Freq: Two times a day (BID) | INTRAMUSCULAR | Status: DC | PRN
  Administered 2019-12-02 – 2019-12-03 (×2): 1 mg via INTRAVENOUS
  Filled 2019-12-02 (×2): qty 1

## 2019-12-02 NOTE — TOC Progression Note (Signed)
Transition of Care [facility]) - Progression Note    Patient Details  Name: Brandon Barry MRN: [id number] Date of Birth: 1975/07/21  [facility] CM/SW Contact  Joaquin Courts, RN Phone Number: 12/02/2019, 4:26 PM  Clinical Narrative:    CM reached out to all area agencies for [facility] with the following results: [facility]- declined [facility]- declined [facility]-declined [facility]- declined [facility]- declined [facility]- no nursing services available [facility]- declined  CM spoke with patient and spouse regarding lack of [place] services.  Spouse expresses that patient is very comfortable with managing his ostomy and they feel comfortable with wound care as well and have contact number for the [facility] RN in case any issues arise at home.  Patient has also been mobilizing well at the hospital and feels confident that mobility will not present an issue. At this time, CM is unable to make [facility] arrangements.    Expected Discharge Plan: [place] Barriers to Discharge: Continued Medical Work up, No [facility] will accept this patient  Expected Discharge Plan and Services Expected Discharge Plan: [place]   Discharge Planning Services: CM Consult Post Acute Care Choice: [place] arrangements for the past 2 months: Single Family Home                 DME Arranged: N/A DME Agency: NA                   Social Determinants of Health (SDOH) Interventions    Readmission Risk Interventions No flowsheet data found.

## 2019-12-02 NOTE — Consult Note (Signed)
[place] Nurse ostomy follow up Stoma type/location: RLQ, loop ileostomy Stomal assessment/size: 2 1/4" x 2 1/2" oval shaped; red rubber cath removed without incidence  Peristomal assessment: intact; mild redness from 6-11 o'clock  Treatment options for stomal/peristomal skin: NA Output liquid brown/green Ostomy pouching: 2pc. 2 3/4"  Education provided:   Discussed stoma characteristics (budded, flush, color, texture, care) while patient removed pouch and cleaned periwound skin   pouch change (cutting new skin barrier, measuring stoma, cleaning peristomal skin and stoma, use of barrier ring)per patient with some cuing  Patient independent with emptying pouch and cleaning spout   Demonstrated "burping" flatus from pouch   Discussed bathing, diet, gas, showering, ADLs'  Discussed risk of peristomal hernia   Provided patient with [employer] and marked items currently using  Wife at bedside but did not participate today  Enrolled patient in [place] Start Discharge program: Yes   [place] Nurse wound follow up Wound type:surgical  Wound bed: clean, pink, moist Drainage (amount, consistency, odor) scant Periwound:intact  Dressing procedure/placement/frequency: DC NPWT dressing Saline moist gauze to the midline, top with ABDor dry dressing. Secure with tape. Teaching patient and wife to perform for DC to home.  [place] Nurse will follow along with you for continued support with ostomy teaching and care [place] MSN, RN, [place], [place], [place]

## 2019-12-02 NOTE — Progress Notes (Signed)
7 Days Post-Op ex lap, pelvic washout, drain placement and diverting ileostomy Subjective:  post op pain more controlled, adequate uop and ostomy output, no fevers, tolerating a diet, off TPN, still using some IV pain meds  Objective: Vital signs in last 24 hours: Temp:  [97.8 F (36.6 C)-98.6 F (37 C)] 98.6 F (37 C) (06/22 0537) Pulse Rate:  [74-79] 74 (06/22 0537) Resp:  [16-17] 16 (06/22 0537) BP: (114-129)/(85-87) 114/86 (06/22 0537) SpO2:  [98 %-100 %] 99 % (06/22 0537)   Intake/Output from previous day: 06/21 0701 - 06/22 0700 In: 1320 [P.O.:1320] Out: 1245 [Urine:200; Drains:20; Stool:1025] Intake/Output this shift: Total I/O In: 20 [I.V.:20] Out: -    General appearance: alert and cooperative GI: appropriately tender, mildly distended Incision: dressing in place Ostomy: beefy red, with bilious output  Lab Results:  No results for input(s): WBC, HGB, HCT, PLT in the last 72 hours. BMET Recent Labs    11/30/19 0318  NA 137  K 4.0  CL 99  CO2 28  GLUCOSE 127*  BUN 18  CREATININE 0.68  CALCIUM 9.0   PT/INR No results for input(s): LABPROT, INR in the last 72 hours. ABG No results for input(s): PHART, HCO3 in the last 72 hours.  Invalid input(s): PCO2, PO2  MEDS, Scheduled . acetaminophen  1,000 mg Oral Q6H  . Chlorhexidine Gluconate Cloth  6 each Topical Daily  . enoxaparin (LOVENOX) injection  40 mg Subcutaneous Q24H  . gabapentin  900 mg Oral TID  . lip balm  1 application Topical BID  . loperamide  2 mg Oral BID AC  . metoprolol tartrate  12.5 mg Oral BID  . multivitamin with minerals  1 tablet Oral Daily  . psyllium  1 packet Oral Daily  . sodium chloride flush  5 mL Intracatheter Q8H    Studies/Results: No results found.  Assessment: s/p Procedure(s): EXPLORATOR LAPAROTMY WITH OPEN PELVIC WASHOUT APPLICATION OF WOUND VAC AND DRAIN PLACEMENT PLACEMENT OF ILEOSTOMY Patient Active Problem List   Diagnosis Date Noted  . OSA on CPAP  11/01/2019  . Peripheral neuropathy   . Hyperlipidemia   . Chronic back pain greater than 3 months duration   . Right shoulder pain   . Intraabdominal abscess 10/31/2019  . Hand foot syndrome w chemotherapy 04/04/2019  . Port-A-Cath in place 03/27/2019  . Cancer of rectum  ypT3, ypN0 s/p TNT & robotic LAR 10/15/2019 02/19/2019  . Pain in left knee 07/22/2018  . Anxiety state 03/18/2007  . DEPRESSION 03/18/2007  . Allergic rhinitis 03/18/2007    No unexpected issues  Plan: Cont diet Wean to PO pain meds Remove picc Switch davol to gravity bag Home health Ok to leave floor  D/c tomorrow if tolerates current pain control regimen    LOS: 31 days     .Rosario Adie, MD [facility], [place]    12/02/2019 8:52 AM

## 2019-12-02 NOTE — Consult Note (Signed)
WOC by to check patient status post pouch change yesterday. Patient reports pouch leaked twice last evening.  Discussed use of ostomy powder for irritated skin, noted some redness yesterday with pouch change but the skin was not broken. Demonstrated crusting technique with ostomy powder on the back of this [place] nurse hand.  Suggested we add 1/2 of ostomy barrier ring from 6-12 o'clock if that is where the pouch is starting to leak.  Output is still very watery.  Patient reports the leaking did not start particularly after any one activity.  4 pouches/skin barrier in the patient room. Left barrier rings as well. Demonstrated use of skin barrier ring on model.   [place] Nurse will follow along with you for continued support with ostomy teaching and care [place] MSN, RN, [place], [place], [place]

## 2019-12-02 NOTE — Progress Notes (Signed)
Nutrition Follow-up  INTERVENTION:   -Ensure Enlive po daily, each supplement provides 350 kcal and 20 grams of protein  NUTRITION DIAGNOSIS:   Increased nutrient needs related to chronic illness, cancer and cancer related treatments as evidenced by estimated needs.  Ongoing.  GOAL:   Patient will meet greater than or equal to 90% of their needs  Progressing.  MONITOR:   PO intake, Labs, Weight trends, I & O's, Skin  ASSESSMENT:   44 year old male who underwent robotic low anterior resection on Oct 14, 4973 by Dr. Leighton Ruff for an adenocarcinoma of the rectum.  Patient was discharged home on Oct 21, 2019.  Since discharge the patient has had persistent lower abdominal pain.  5/22: s/p CT guided drainage of percutaneous catheter 5/23: pt on clear liquid diet 5/24: diet was advanced to fulls 5/28: NPO, TPN initiation 6/1: s/p Rt TG drain exchange 6/20: TPN weaned off, regular diet started  Patient now off TPN. On regular diet, consuming ~25% of meals currently. Will order Ensure supplements for additional kcals and protein.  I/Os: +9.7L since 6/8 UOP: 400 ml x 24 hrs Ileostomy: 500 ml x 24 hrs  Labs reviewed.  Medications: Multivitamin with minerals daily, Metamucil  Diet Order:   Diet Order            Diet regular Room service appropriate? Yes; Fluid consistency: Thin  Diet effective now                 EDUCATION NEEDS:   No education needs have been identified at this time  Skin:  Skin Assessment: Skin Integrity Issues: Skin Integrity Issues:: Incisions Incisions: abdominal  Last BM:  6/21  Height:   Ht Readings from Last 1 Encounters:  10/31/19 6' (1.829 m)    Weight:   Wt Readings from Last 1 Encounters:  11/28/19 66.3 kg    Ideal Body Weight:     BMI:  Body mass index is 19.82 kg/m.  Estimated Nutritional Needs:   Kcal:  2200-2400  Protein:  105-120g  Fluid:  2.2L/day  Brandon Bibles, MS, RD, LDN Inpatient Clinical  Dietitian Contact information available via Amion

## 2019-12-02 NOTE — Progress Notes (Signed)
Pt. able to place on independently, remains on room air, aware to notify if needed.

## 2019-12-03 MED ORDER — LOPERAMIDE HCL 2 MG PO CAPS: 2.0000 mg | ORAL_CAPSULE | Freq: Three times a day (TID) | ORAL | 0 refills | Status: DC

## 2019-12-03 MED ORDER — GABAPENTIN 300 MG PO CAPS: 900.0000 mg | ORAL_CAPSULE | Freq: Three times a day (TID) | ORAL | 1 refills | Status: DC

## 2019-12-03 MED ORDER — OXYCODONE HCL 5 MG PO TABS: 5.0000 mg | ORAL_TABLET | Freq: Four times a day (QID) | ORAL | 0 refills | Status: DC | PRN

## 2019-12-03 NOTE — Progress Notes (Signed)
Discharge instructions discussed with patient and wife, including dressing change, verbalized agreement and understanding

## 2019-12-03 NOTE — Discharge Summary (Signed)
Physician Discharge Summary  Patient ID: Brandon Barry MRN: [id number] DOB/AGE: Oct 22, 1975 44 y.o.  Admit date: 10/31/2019 Discharge date: 12/03/2019  Admission Diagnoses: anastomotic leak  Discharge Diagnoses:  Principal Problem:   Intraabdominal abscess Active Problems:   Anxiety state   Cancer of rectum  ypT3, ypN0 s/p TNT & robotic LAR 10/15/2019   OSA on CPAP   Peripheral neuropathy   Hyperlipidemia   Chronic back pain greater than 3 months duration   Right shoulder pain   Hand foot syndrome w chemotherapy   Discharged Condition: good  Hospital Course: 44 year old male who presented to the hospital with significant abdominal pain several weeks after low anterior resection.  It was determined that he had a anastomotic leak.  Given the timing, it was decided to place the patient on IV nutrition and n.p.o.  CT-guided drains were placed in fluid collections to control his infection.  After he was more sufficiently out from surgery, we decided to proceed with diverting ileostomy.  This was completed on November 25, 2019.  He was then transitioned to oral diet and oral pain medications.  His PICC line was removed.  By postop day 8 he was felt to be in stable condition for discharge to home.  He will continue to monitor his ostomy output at home and monitor for signs of dehydration.  I will see him back in the office in approximately 2 weeks.  We will have a home health nurse see him at home and evaluate his wounds and help him with his ostomy.  Consults: None  Significant Diagnostic Studies: labs: cbc, bmet  Treatments: IV hydration, antibiotics: Zosyn, analgesia: Dilaudid, TPN and surgery: see above  Discharge Exam: Blood pressure 121/86, pulse 90, temperature 98.5 F (36.9 C), temperature source Oral, resp. rate 18, height 6' (1.829 m), weight 66.3 kg, SpO2 98 %. General appearance: alert and cooperative GI: normal findings: soft, non-tender Incision/Wound: clean  Disposition:   There are no questions and answers to display.         Allergies as of 12/03/2019   No Known Allergies     Medication List    STOP taking these medications   lidocaine-prilocaine cream Commonly known as: EMLA     TAKE these medications   acetaminophen 500 MG tablet Commonly known as: TYLENOL Take 500 mg by mouth every 6 (six) hours as needed for moderate pain.   gabapentin 300 MG capsule Commonly known as: NEURONTIN Take 3 capsules (900 mg total) by mouth 3 (three) times daily. What changed:   how much to take  when to take this   ibuprofen 200 MG tablet Commonly known as: ADVIL Take 400 mg by mouth every 6 (six) hours as needed for moderate pain.   loperamide 2 MG capsule Commonly known as: IMODIUM Take 1 capsule (2 mg total) by mouth 3 (three) times daily before meals.   LORazepam 0.5 MG tablet Commonly known as: ATIVAN Take 1 tablet (0.5 mg total) by mouth every 8 (eight) hours as needed for anxiety. Or nausea. Do not drive while taking   oxyCODONE 5 MG immediate release tablet Commonly known as: Oxy IR/ROXICODONE Take 1-2 tablets (5-10 mg total) by mouth every 6 (six) hours as needed for moderate pain, severe pain or breakthrough pain.       Follow-up Information    Leighton Ruff, MD. Go on 11/15/4401.   Specialty: General Surgery Why: Come to the office at 10:10 am for a 10:20 am apt Contact information: [address] [place] [address] [phone]               Signed: Rosario Adie 0/08/4915, 8:57 AM

## 2019-12-03 NOTE — Discharge Instructions (Signed)
ABDOMINAL SURGERY: POST OP INSTRUCTIONS  1. DIET: Follow a light bland diet the first 24 hours after arrival home, such as soup, liquids, crackers, etc.  Be sure to include lots of fluids daily.  Avoid fast food or heavy meals as your are more likely to get nauseated.  Do not eat any uncooked fruits or vegetables for the next 2 weeks as your colon heals. 2. Take your usually prescribed home medications unless otherwise directed. 3. PAIN CONTROL: a. Pain is best controlled by a usual combination of three different methods TOGETHER: i. Ice/Heat ii. Over the counter pain medication iii. Prescription pain medication b. Most patients will experience some swelling and bruising around the incisions.  Ice packs or heating pads (30-60 minutes up to 6 times a day) will help. Use ice for the first few days to help decrease swelling and bruising, then switch to heat to help relax tight/sore spots and speed recovery.  Some people prefer to use ice alone, heat alone, alternating between ice & heat.  Experiment to what works for you.  Swelling and bruising can take several weeks to resolve.   c. It is helpful to take an over-the-counter pain medication regularly for the first few weeks.  Choose one of the following that works best for you: i. Naproxen (Aleve, etc)  Two 220mg  tabs twice a day ii. Ibuprofen (Advil, etc) Three 200mg  tabs four times a day (every meal & bedtime) iii. Acetaminophen (Tylenol, etc) 500-650mg  four times a day (every meal & bedtime) d. A  prescription for pain medication (such as oxycodone, hydrocodone, etc) should be given to you upon discharge.  Take your pain medication as prescribed.  i. If you are having problems/concerns with the prescription medicine (does not control pain, nausea, vomiting, rash, itching, etc), please call us [phone] to see if we need to switch you to a different pain medicine that will work better for you and/or control your side effect better. ii. If you  need a refill on your pain medication, please contact your pharmacy.  They will contact our office to request authorization. Prescriptions will not be filled after 5 pm or on week-ends. 4. Watch your ostomy output.  You should have no more than 1L (32 oz) of output per day.  Take imodium prior to every meal.  Use a fiber pill if your output is still high or your urine becomes dark.  Drink extra fluids with electrolytes to avoid dehydration.  If these things do not improve over 24 hrs, call the office for further instruction. 5. Wash / shower every day.  You may shower over the incision / wound.  Avoid baths until the skin is fully healed.  Continue to shower over incision(s) after the dressing is off. 6. Change your dressing daily.  You may wash around your tube with diluted hydrogen peroxide and a q tip. 7. ACTIVITIES as tolerated:   a. You may resume regular (light) daily activities beginning the next day--such as daily self-care, walking, climbing stairs--gradually increasing activities as tolerated.  If you can walk 30 minutes without difficulty, it is safe to try more intense activity such as jogging, treadmill, bicycling, low-impact aerobics, swimming, etc. b. Save the most intensive and strenuous activity for last such as sit-ups, heavy lifting, contact sports, etc  Refrain from any heavy lifting or straining until you are off narcotics for pain control.   c. DO NOT PUSH THROUGH PAIN.  Let pain be your guide: If it hurts to do  something, don't do it.  Pain is your body warning you to avoid that activity for another week until the pain goes down. d. You may drive when you are no longer taking prescription pain medication, you can comfortably wear a seatbelt, and you can safely maneuver your car and apply brakes. e. Dennis Bast may have sexual intercourse when it is comfortable.  8. FOLLOW UP in our office a. Please call [facility] at [phone] to set up an appointment to see your surgeon in the office for a  follow-up appointment approximately 1-2 weeks after your surgery. b. Make sure that you call for this appointment the day you arrive home to insure a convenient appointment time. 10. IF YOU HAVE DISABILITY OR FAMILY LEAVE FORMS, BRING THEM TO THE OFFICE FOR PROCESSING.  DO NOT GIVE THEM TO YOUR DOCTOR.   WHEN TO CALL us [phone]: 1. Poor pain control 2. Reactions / problems with new medications (rash/itching, nausea, etc)  3. Fever over 101.5 F (38.5 C) 4. Inability to urinate 5. Nausea and/or vomiting 6. Worsening swelling or bruising 7. Continued bleeding from incision. 8. Increased pain, redness, or drainage from the incision  The clinic staff is available to answer your questions during regular business hours (8:30am-5pm).  Please don't hesitate to call and ask to speak to one of our nurses for clinical concerns.   A surgeon from [facility] is always on call at the hospitals   If you have a medical emergency, go to the nearest emergency room or call 911.    [facility], [place], [place], [place], [place]  [address] ? MAIN: [phone] ? TOLL FREE: [phone] ? FAX [phone] [email or web address]

## 2019-12-08 ENCOUNTER — Encounter: Payer: Commercial Managed Care - PPO | Admitting: Physical Therapy

## 2019-12-16 ENCOUNTER — Encounter: Payer: Commercial Managed Care - PPO | Admitting: Physical Therapy

## 2019-12-22 ENCOUNTER — Encounter (HOSPITAL_COMMUNITY): Payer: Self-pay | Admitting: Radiology

## 2019-12-22 ENCOUNTER — Ambulatory Visit (HOSPITAL_COMMUNITY)
Admission: RE | Admit: 2019-12-22 | Discharge: 2019-12-22 | Disposition: A | Discharge status: Home / Self Care | Payer: Commercial Managed Care - PPO | Source: Ambulatory Visit | Attending: Surgery | Admitting: Surgery

## 2019-12-22 ENCOUNTER — Encounter: Payer: Commercial Managed Care - PPO | Admitting: Physical Therapy

## 2019-12-22 ENCOUNTER — Other Ambulatory Visit (HOSPITAL_COMMUNITY): Payer: Self-pay | Admitting: Surgery

## 2019-12-22 ENCOUNTER — Other Ambulatory Visit: Payer: Self-pay

## 2019-12-22 DIAGNOSIS — K819 Cholecystitis, unspecified: Secondary | ICD-10-CM

## 2019-12-22 HISTORY — PX: IR PATIENT EVAL TECH 0-60 MINS: IMG5564

## 2019-12-22 NOTE — Procedures (Signed)
Patient came for new drain bag.

## 2019-12-29 ENCOUNTER — Encounter: Payer: Commercial Managed Care - PPO | Admitting: Physical Therapy

## 2020-02-11 ENCOUNTER — Other Ambulatory Visit: Payer: Self-pay | Admitting: General Surgery

## 2020-02-11 DIAGNOSIS — C20 Malignant neoplasm of rectum: Secondary | ICD-10-CM

## 2020-02-13 ENCOUNTER — Telehealth: Payer: Self-pay | Admitting: Oncology

## 2020-02-13 ENCOUNTER — Telehealth: Payer: Self-pay | Admitting: *Deleted

## 2020-02-13 DIAGNOSIS — C20 Malignant neoplasm of rectum: Secondary | ICD-10-CM

## 2020-02-13 MED ORDER — GABAPENTIN 300 MG PO CAPS: 900.0000 mg | ORAL_CAPSULE | Freq: Three times a day (TID) | ORAL | 0 refills | Status: DC

## 2020-02-13 NOTE — Telephone Encounter (Signed)
Scheduled appointment per 9/3 scheduling message. Patient is aware of appointment date and time

## 2020-02-13 NOTE — Telephone Encounter (Signed)
Called to request refill on gabapentin 300 mg capsules. Dr. [name] has declined to continue to refill this medication and he has significant neuropathy and pain. Was in hospital several weeks--d/c 12/03/19. Scheduled for CT scan 02/27/20. Will refill gabapentin and sent scheduling message to have him seen by Dr. Benay Spice afterwards.

## 2020-02-27 ENCOUNTER — Ambulatory Visit
Admission: RE | Admit: 2020-02-27 | Discharge: 2020-02-27 | Disposition: A | Discharge status: Home / Self Care | Payer: Commercial Managed Care - PPO | Source: Ambulatory Visit | Attending: General Surgery | Admitting: General Surgery

## 2020-02-27 DIAGNOSIS — C20 Malignant neoplasm of rectum: Secondary | ICD-10-CM

## 2020-02-27 MED ORDER — IOPAMIDOL (ISOVUE-300) INJECTION 61%
100.0000 mL | Freq: Once | INTRAVENOUS | Status: AC | PRN
  Administered 2020-02-27: 100 mL via INTRAVENOUS

## 2020-03-01 ENCOUNTER — Inpatient Hospital Stay: Payer: Commercial Managed Care - PPO

## 2020-03-01 ENCOUNTER — Encounter: Payer: Self-pay | Admitting: Oncology

## 2020-03-01 ENCOUNTER — Other Ambulatory Visit: Payer: Self-pay

## 2020-03-01 ENCOUNTER — Telehealth: Payer: Self-pay | Admitting: Oncology

## 2020-03-01 ENCOUNTER — Inpatient Hospital Stay: Payer: Commercial Managed Care - PPO | Attending: Oncology | Admitting: Oncology

## 2020-03-01 VITALS — BP 142/90 | HR 66 | Temp 98.6°F | Resp 18 | Ht 72.0 in | Wt 154.6 lb

## 2020-03-01 DIAGNOSIS — Z23 Encounter for immunization: Secondary | ICD-10-CM | POA: Insufficient documentation

## 2020-03-01 DIAGNOSIS — Z9221 Personal history of antineoplastic chemotherapy: Secondary | ICD-10-CM | POA: Insufficient documentation

## 2020-03-01 DIAGNOSIS — C19 Malignant neoplasm of rectosigmoid junction: Secondary | ICD-10-CM | POA: Diagnosis not present

## 2020-03-01 DIAGNOSIS — C20 Malignant neoplasm of rectum: Secondary | ICD-10-CM

## 2020-03-01 DIAGNOSIS — Z923 Personal history of irradiation: Secondary | ICD-10-CM | POA: Diagnosis not present

## 2020-03-01 LAB — CEA (IN HOUSE-CHCC): CEA (CHCC-In House): 1.61 ng/mL (ref 0.00–5.00)

## 2020-03-01 MED ORDER — INFLUENZA VAC SPLIT QUAD 0.5 ML IM SUSY
0.5000 mL | PREFILLED_SYRINGE | Freq: Once | INTRAMUSCULAR | Status: AC
  Administered 2020-03-01: 0.5 mL via INTRAMUSCULAR

## 2020-03-01 MED ORDER — INFLUENZA VAC SPLIT QUAD 0.5 ML IM SUSY
PREFILLED_SYRINGE | INTRAMUSCULAR | Status: AC
  Filled 2020-03-01: qty 0.5

## 2020-03-01 NOTE — Progress Notes (Signed)
[place] OFFICE PROGRESS NOTE   Diagnosis: Rectal cancer  INTERVAL HISTORY:   Brandon Barry returns for a scheduled visit.  He has recovered from the rectal dehiscence/abscess.  A diverting ileostomy remains in place.  He empties the ileostomy approximately 10 times per day.  Imodium decreases the stool output.  He has returned to work.  He is gaining weight.  Peripheral numbness and pain has improved.  Gabapentin helps.  The numbness does not interfere with hand skills.  Objective:  Vital signs in last 24 hours:  Blood pressure (!) 139/100, pulse 66, temperature 98.6 F (37 C), temperature source Tympanic, resp. rate 18, height 6' (1.829 m), weight 154 lb 9.6 oz (70.1 kg).    Lymphatics: No cervical, supraclavicular, axillary, or inguinal nodes Resp: Lungs clear bilaterally Cardio: Regular rate and rhythm GI: No hepatomegaly, right lower quadrant ileostomy, no mass, nontender, nodular inflammatory lesion at the inferior aspect of the midline scar Vascular: No leg edema   Lab Results:  Lab Results  Component Value Date   WBC 8.2 11/29/2019   HGB 8.2 (L) 11/29/2019   HCT 25.9 (L) 11/29/2019   MCV 89.9 11/29/2019   PLT 381 11/29/2019   NEUTROABS 4.0 11/24/2019    CMP  Lab Results  Component Value Date   NA 137 11/30/2019   K 4.0 11/30/2019   CL 99 11/30/2019   CO2 28 11/30/2019   GLUCOSE 127 (H) 11/30/2019   BUN 18 11/30/2019   CREATININE 0.68 11/30/2019   CALCIUM 9.0 11/30/2019   PROT 6.1 (L) 11/27/2019   ALBUMIN 2.7 (L) 11/27/2019   AST 15 11/27/2019   ALT 16 11/27/2019   ALKPHOS 93 11/27/2019   BILITOT 0.6 11/27/2019   GFRNONAA >60 11/30/2019   GFRAA >60 11/30/2019    Lab Results  Component Value Date   CEA1 4.36 02/19/2019    Lab Results  Component Value Date   INR 1.2 11/01/2019    Imaging:  CT PELVIS W CONTRAST  Result Date: 02/27/2020 CLINICAL DATA:  Rectal cancer diagnosed 2020 status post neoadjuvant chemo radiation therapy  and subsequent low anterior resection complicated by abscess status post percutaneous drainage x3 on 11/01/2019. Open laparotomy with ileostomy and pelvic washout 11/25/2019. EXAM: CT PELVIS WITH CONTRAST TECHNIQUE: Multidetector CT imaging of the pelvis was performed using the standard protocol following the bolus administration of intravenous contrast. CONTRAST:  173m ISOVUE-300 IOPAMIDOL (ISOVUE-300) INJECTION 61% COMPARISON:  11/15/2019 CT abdomen/pelvis. FINDINGS: Urinary Tract: Chronic mild diffuse bladder wall thickening, unchanged. Bowel: Pelvic small bowel loops are now normal caliber with no small bowel wall thickening. Oral contrast transits to the pelvic small bowel loops. Normal appendix. Postsurgical changes from rectal resection with colorectal anastomosis. No residual measurable perirectal fluid collection or free air. Right Transgluteal and ventral abdominal drains have been removed in the interval. Mild ill-defined hypodensity in the presacral space in a curvilinear distribution abutting the colorectal anastomosis (series 2/image 31), nonspecific, favor post treatment/postsurgical change. No evidence of bowel wall thickening in the pelvic bowel loops. Vascular/Lymphatic: No acute vascular abnormality. No pathologically enlarged pelvic lymph nodes. Reproductive: Mild prostatomegaly with mass-effect on the bladder base by the enlarged nodular median lobe of the prostate. Other:  No ascites. Musculoskeletal: No aggressive appearing focal osseous lesions. IMPRESSION: 1. No residual measurable peri-anastomotic fluid collection or free air in the LAR bed. 2. Nonspecific mild ill-defined hypodensity in the presacral space in a curvilinear distribution abutting the anastomosis, favor post treatment/postsurgical change, recommend surveillance on follow-up CT  abdomen/pelvis with oral and IV contrast. 3. No evidence of metastatic disease in the pelvis. 4. Chronic mild diffuse bladder wall thickening,  probably due to chronic bladder outlet obstruction by the enlarged prostate. Electronically Signed   By: Ilona Sorrel M.D.   On: 02/27/2020 15:45    Medications: I have reviewed the patient's current medications.   Assessment/Plan: 1. Rectosigmoid cancer   Colonoscopy by Dr. Benson Norway 02/04/2019-fungating infiltrative and ulcerated nonobstructing large mass in the rectum. The mass was circumferential measuring 3 cm in length. There was no bleeding present. The mass was 10 cm from the anal verge. Biopsy showed invasive adenocarcinoma, moderately differentiated; MLH1, MSH2, MSH6 and PMS2 intact.   CTs abdomen and pelvis 02/04/2019-apparent colorectal mass measuring 3.9 x 5.4 x 3.2 cm involving the distal sigmoid colon and proximal rectum with adjacent borderline enlarged suspicious mesorectal lymph nodes; indeterminate lesion in segment 6 of the liver measuring 1.4 x 1.1 cm.  MRI pelvis 02/13/2019-T3c, N1 tumor at 11.5 cm from the anal verge, 6.7 cm from the internal anal sphincter, 3 lymph nodes greater than 5 mm, largest 8 mm  CT chest 02/13/2019-negative for metastatic disease  MRI liver 02/13/2019-3.6 cm hemangioma in the posterior right hepatic lobe, 10 mm indeterminate lesion in the posterior right hepatic lobe-potentially an atypical hemangioma-solitary metastasis not excluded  Cycle 1 FOLFOX 02/26/2019  Cycle 2 FOLFOX 03/13/2019, Emend added  Cycle 3 FOLFOX 03/27/2019  Cycle 4 FOLFOX 04/10/2019  Cycle 5 FOLFOX 04/24/2019  Cycle 6 FOLFOX 05/07/2019  Cycle 7 FOLFOX 05/22/2019 (5-fluorouracil dose reduced)  Cycle 8 FOLFOX 06/09/2019  Capecitabine/radiation 06/30/2019-07/06/2019  Capecitabine placed on hold with the p.m. dose on 07/30/2019, 1000 mg twice daily  Capecitabine resumed with the p.m. dose on 08/04/2019 with a dose reduction  CT liver 08/11/2019-stable subcapsular hemangioma right hepatic lobe.  Stable small indeterminate right hepatic lobe lesion unchanged in size from CT  02/04/2019.  10/15/2019, robotic assisted low anterior resection/Port-A-Cath removal, adenocarcinoma of distal sigmoid/proximal rectum invading pericolonic connective tissue, negative resection margins, 0/24 nodes,ypT3ypN0, treatment effect present-tumor associated fibrosis   CT abdomen/pelvis 10/31/2019-anastomotic breakdown at the rectum, extraluminal stool, right lower quadrant abscess, small bowel obstruction, air within the bladder lumen, stable subcentimeter right hepatic nodule  CT abdomen/pelvis 11/15/2019-stable 1 cm segment 7 lesion, no new liver lesions, anastomotic breakdown at the rectum with decreased size of complex surrounding gas/fluid collection in good position of transgluteal drain, persistent small bowel obstruction  CT pelvis 02/27/2020-no measurable perianastomotic fluid collection or free air, no evidence of metastatic disease 2. Change in bowel habits/rectal pain secondary to #1, improved 3. Port-A-Cath placement, Dr. Hassell Done, 02/21/2019 4. Early oxaliplatin neuropathy-trial of Neurontin initiated 08/25/2019 due to foot pain, improved 5. Pain secondary to hand/foot syndrome, radiation skin toxicity, and radiation proctitis-improved 6. Admission with intra-abdominal abscess/anastomotic dehiscence, surgery 11/25/2019- exploratory laparotomy, placement of wound VAC and drain, diverting loop ileostomy     Disposition: Brandon Barry is in clinical remission from rectal cancer.  He will follow-up with Dr. [name] for reversal of the ileostomy.  We will check the CEA today.  He underwent multiple imaging studies over the spring and summer.  He will be scheduled for restaging CTs and an office visit in 6 months.  Hopefully the neuropathy symptoms will continue to improve.  He received an influenza vaccine today.  I recommended he obtain a COVID-19 vaccine.  He says he will consider this.  Betsy Coder, MD  03/01/2020  10:06 AM

## 2020-03-01 NOTE — Telephone Encounter (Signed)
Scheduled appointment per 9/20 los. Spoke with patient in person. Patient is aware of upcoming appointments.

## 2020-03-10 ENCOUNTER — Other Ambulatory Visit: Payer: Self-pay | Admitting: General Surgery

## 2020-03-10 DIAGNOSIS — Z932 Ileostomy status: Secondary | ICD-10-CM

## 2020-03-10 DIAGNOSIS — C20 Malignant neoplasm of rectum: Secondary | ICD-10-CM

## 2020-03-12 ENCOUNTER — Ambulatory Visit
Admission: RE | Admit: 2020-03-12 | Discharge: 2020-03-12 | Disposition: A | Discharge status: Home / Self Care | Payer: Commercial Managed Care - PPO | Source: Ambulatory Visit | Attending: General Surgery | Admitting: General Surgery

## 2020-03-12 DIAGNOSIS — C20 Malignant neoplasm of rectum: Secondary | ICD-10-CM

## 2020-03-12 DIAGNOSIS — Z932 Ileostomy status: Secondary | ICD-10-CM

## 2020-04-19 ENCOUNTER — Other Ambulatory Visit (HOSPITAL_COMMUNITY)
Admission: RE | Admit: 2020-04-19 | Discharge: 2020-04-19 | Disposition: A | Discharge status: Home / Self Care | Payer: Commercial Managed Care - PPO | Source: Ambulatory Visit | Attending: General Surgery | Admitting: General Surgery

## 2020-04-19 DIAGNOSIS — Z20822 Contact with and (suspected) exposure to covid-19: Secondary | ICD-10-CM | POA: Diagnosis not present

## 2020-04-19 DIAGNOSIS — Z01812 Encounter for preprocedural laboratory examination: Secondary | ICD-10-CM | POA: Diagnosis not present

## 2020-04-19 LAB — SARS CORONAVIRUS 2 (TAT 6-24 HRS): SARS Coronavirus 2: NEGATIVE

## 2020-04-22 ENCOUNTER — Encounter (HOSPITAL_COMMUNITY)
Admission: RE | Disposition: A | Discharge status: Home / Self Care | Payer: Self-pay | Source: Home / Self Care | Attending: General Surgery

## 2020-04-22 ENCOUNTER — Encounter (HOSPITAL_COMMUNITY): Payer: Self-pay | Admitting: General Surgery

## 2020-04-22 ENCOUNTER — Other Ambulatory Visit: Payer: Self-pay

## 2020-04-22 ENCOUNTER — Ambulatory Visit (HOSPITAL_COMMUNITY): Payer: Commercial Managed Care - PPO | Admitting: Certified Registered Nurse Anesthetist

## 2020-04-22 ENCOUNTER — Ambulatory Visit (HOSPITAL_COMMUNITY)
Admission: RE | Admit: 2020-04-22 | Discharge: 2020-04-22 | Disposition: A | Discharge status: Home / Self Care | Payer: Commercial Managed Care - PPO | Attending: General Surgery | Admitting: General Surgery

## 2020-04-22 DIAGNOSIS — Z85048 Personal history of other malignant neoplasm of rectum, rectosigmoid junction, and anus: Secondary | ICD-10-CM | POA: Diagnosis not present

## 2020-04-22 DIAGNOSIS — Z9221 Personal history of antineoplastic chemotherapy: Secondary | ICD-10-CM | POA: Diagnosis not present

## 2020-04-22 DIAGNOSIS — K624 Stenosis of anus and rectum: Secondary | ICD-10-CM | POA: Diagnosis not present

## 2020-04-22 DIAGNOSIS — Z09 Encounter for follow-up examination after completed treatment for conditions other than malignant neoplasm: Secondary | ICD-10-CM | POA: Diagnosis present

## 2020-04-22 DIAGNOSIS — Z932 Ileostomy status: Secondary | ICD-10-CM | POA: Insufficient documentation

## 2020-04-22 DIAGNOSIS — Z87891 Personal history of nicotine dependence: Secondary | ICD-10-CM | POA: Insufficient documentation

## 2020-04-22 HISTORY — PX: FLEXIBLE SIGMOIDOSCOPY: SHX5431

## 2020-04-22 SURGERY — SIGMOIDOSCOPY, FLEXIBLE
Anesthesia: Monitor Anesthesia Care

## 2020-04-22 MED ORDER — SODIUM CHLORIDE 0.9% FLUSH: 3.0000 mL | Freq: Two times a day (BID) | INTRAVENOUS | Status: DC

## 2020-04-22 MED ORDER — MIDAZOLAM HCL (PF) 5 MG/ML IJ SOLN
INTRAMUSCULAR | Status: AC
  Filled 2020-04-22: qty 2

## 2020-04-22 MED ORDER — FENTANYL CITRATE (PF) 100 MCG/2ML IJ SOLN
INTRAMUSCULAR | Status: AC
  Filled 2020-04-22: qty 2

## 2020-04-22 MED ORDER — MIDAZOLAM HCL (PF) 5 MG/ML IJ SOLN
INTRAMUSCULAR | Status: DC | PRN
  Administered 2020-04-22: 1 mg via INTRAVENOUS
  Administered 2020-04-22 (×2): 2 mg via INTRAVENOUS

## 2020-04-22 MED ORDER — FENTANYL CITRATE (PF) 100 MCG/2ML IJ SOLN
INTRAMUSCULAR | Status: DC | PRN
  Administered 2020-04-22 (×3): 25 ug via INTRAVENOUS

## 2020-04-22 MED ORDER — LACTATED RINGERS IV SOLN: INTRAVENOUS | Status: DC

## 2020-04-22 NOTE — Discharge Instructions (Signed)
Flexible Sigmoidoscopy, Care After This sheet gives you information about how to care for yourself after your procedure. Your health care provider may also give you more specific instructions. If you have problems or questions, contact your health care provider. What can I expect after the procedure? After the procedure, it is common to have:  Abdominal cramping or pain.  Bloating.  A small amount of rectal bleeding if you had a biopsy. Follow these instructions at home:  Take over-the-counter and prescription medicines only as told by your health care provider.  Do not drive for 24 hours if you received a medicine to help you relax (sedative).  Keep all follow-up visits as told by your health care provider. This is important. Contact a health care provider if:  You have abdominal pain or cramping that gets worse or is not helped with medicine.  You continue to have small amounts of rectal bleeding after 24 hours.  You have nausea or vomiting.  You feel weak or dizzy.  You have a fever. Get help right away if:  You pass large blood clots or see a large amount of blood in the toilet after having a bowel movement.  You have nausea or vomiting for more than 24 hours after the procedure. This information is not intended to replace advice given to you by your health care provider. Make sure you discuss any questions you have with your health care provider. Document Revised: 01/20/2016 Document Reviewed: 08/28/2015 Elsevier Patient Education  2020 Elsevier Inc.  

## 2020-04-22 NOTE — Anesthesia Preprocedure Evaluation (Deleted)
Anesthesia Evaluation    Reviewed: Allergy & Precautions, Patient's Chart, lab work & pertinent test results  History of Anesthesia Complications Negative for: history of anesthetic complications  Airway        Dental   Pulmonary sleep apnea , former smoker,           Cardiovascular negative cardio ROS       Neuro/Psych PSYCHIATRIC DISORDERS Anxiety Depression  Neuromuscular disease (peripheral neuropathy)    GI/Hepatic negative GI ROS, Neg liver ROS,  Rectal adenocarcinoma    Endo/Other  negative endocrine ROS  Renal/GU negative Renal ROS     Musculoskeletal negative musculoskeletal ROS (+)   Abdominal   Peds  Hematology negative hematology ROS (+)   Anesthesia Other Findings Covid test negative   Reproductive/Obstetrics                             Anesthesia Physical Anesthesia Plan  ASA: III  Anesthesia Plan: MAC   Post-op Pain Management:    Induction: Intravenous  PONV Risk Score and Plan: 1 and Propofol infusion and Treatment may vary due to age or medical condition  Airway Management Planned: Nasal Cannula and Natural Airway  Additional Equipment: None  Intra-op Plan:   Post-operative Plan:   Informed Consent:   Plan Discussed with: CRNA and Anesthesiologist  Anesthesia Plan Comments:         Anesthesia Quick Evaluation

## 2020-04-22 NOTE — Op Note (Signed)
[facility] Patient Name: Brandon Barry Procedure Date: 04/22/2020 MRN: [id number] Attending MD: Leighton Ruff , MD Date of Birth: 09/07/1975 CSN: [id number] Age: 44 Admit Type: Outpatient Procedure:                Flexible Sigmoidoscopy Indications:              Personal history of malignant rectal neoplasm, Post                            surgical follow-up, Abnormal barium enema Providers:                Leighton Ruff, MD, Erenest Rasher, RN, Tyna Jaksch                            Technician Referring MD:              Medicines:                Fentanyl 50 micrograms IV, Midazolam 5 mg IV, None Complications:            No immediate complications. Estimated blood loss:                            Minimal. Estimated Blood Loss:     Estimated blood loss was minimal. Procedure:                Pre-Anesthesia Assessment:                           - Prior to the procedure, a History and Physical                            was performed, and patient medications and                            allergies were reviewed. The patient's tolerance of                            previous anesthesia was also reviewed. The risks                            and benefits of the procedure and the sedation                            options and risks were discussed with the patient.                            All questions were answered, and informed consent                            was obtained. Prior Anticoagulants: The patient has                            taken no previous anticoagulant or antiplatelet  agents. ASA Grade Assessment: II - A patient with                            mild systemic disease. After reviewing the risks                            and benefits, the patient was deemed in                            satisfactory condition to undergo the procedure.                           After obtaining informed consent, the scope was                             passed under direct vision. The PCF-H190DL                            ([id number] Olympus pediatric colonscope was                            introduced through the anus and advanced to the the                            descending colon. The flexible sigmoidoscopy was                            accomplished without difficulty. The patient                            tolerated the procedure fairly well. Scope In: Scope Out: Findings:      The perianal examination was normal.      A benign-appearing, intrinsic moderate stenosis measuring 5 cm (in       length) x 9 mm (inner diameter) was found in the mid rectum and was       non-traversed. Impression:               - Stricture in the mid rectum.                           - No specimens collected. Moderate Sedation:      Moderate (conscious) sedation was administered by the endoscopy nurse       and supervised by the endoscopist. The following parameters were       monitored: oxygen saturation, heart rate, blood pressure, and response       to care. Recommendation:           - Discharge patient to home (ambulatory).                           - Resume previous diet. Procedure Code(s):        --- Professional ---                           [phone], Sigmoidoscopy, flexible; diagnostic,  including collection of specimen(s) by brushing or                            washing, when performed (separate procedure) Diagnosis Code(s):        --- Professional ---                           K62.4, Stenosis of anus and rectum                           Z85.048, Personal history of other malignant                            neoplasm of rectum, rectosigmoid junction, and anus                           Z09, Encounter for follow-up examination after                            completed treatment for conditions other than                            malignant neoplasm                           R93.3, Abnormal findings on diagnostic  imaging of                            other parts of digestive tract CPT copyright 2019 [employer]. All rights reserved. The codes documented in this report are preliminary and upon coder review may  be revised to meet current compliance requirements. Leighton Ruff, MD Leighton Ruff, MD 37/16/9678 12:10:15 PM This report has been signed electronically. Number of Addenda: 0

## 2020-04-22 NOTE — H&P (Signed)
HPI: Brandon Barry is an 44 y.o. male who is here for evaluation of his anastomotic stricture.  He has a diverting ileostomy after anastomotic leak.    Past Medical History:  Diagnosis Date  . Chronic back pain greater than 3 months duration   . Hyperlipidemia   . OSA on CPAP   . Peripheral neuropathy    Hands and feet from Chemo  . Rectal adenocarcinoma ([place])   . Right shoulder pain     Past Surgical History:  Procedure Laterality Date  . APPLICATION OF WOUND VAC N/A 11/25/2019   Procedure: APPLICATION OF WOUND VAC AND DRAIN PLACEMENT;  Surgeon: Leighton Ruff, MD;  Location: [facility];  Service: General;  Laterality: N/A;  . COLONOSCOPY  02/04/2019  . ILEOSTOMY Right 11/25/2019   Procedure: PLACEMENT OF ILEOSTOMY;  Surgeon: Leighton Ruff, MD;  Location: [facility];  Service: General;  Laterality: Right;  . IR CATHETER TUBE CHANGE  11/11/2019  . IR PATIENT EVAL TECH 0-60 MINS  12/22/2019  . KNEE ARTHROSCOPY Left 11/19/2018  . LAPAROTOMY N/A 11/25/2019   Procedure: Sela Hilding WITH OPEN PELVIC WASHOUT;  Surgeon: Leighton Ruff, MD;  Location: [facility];  Service: General;  Laterality: N/A;  . PORT-A-CATH REMOVAL N/A 10/15/2019   Procedure: PORT REMOVAL;  Surgeon: Leighton Ruff, MD;  Location: [facility];  Service: General;  Laterality: N/A;  . PORTACATH PLACEMENT Left 02/21/2019   Procedure: INSERTION PORT-A-CATH;  Surgeon: Johnathan Hausen, MD;  Location: [facility];  Service: General;  Laterality: Left;  . WISDOM TOOTH EXTRACTION    . XI ROBOTIC ASSISTED LOWER ANTERIOR RESECTION N/A 10/15/2019   Procedure: XI ROBOTIC ASSISTED LOWER ANTERIOR RESECTION, RIGID PROCTOSCOPY;  Surgeon: Leighton Ruff, MD;  Location: [facility];  Service: General;  Laterality: N/A;    Family History  Problem Relation Age of Onset  . Heart attack Father   . Cancer Paternal Grandmother   . Cancer Paternal Grandfather     Social:  reports that he has quit smoking. His smoking use included cigarettes. He has never  used smokeless tobacco. He reports current alcohol use. He reports that he does not use drugs.  Allergies: No Known Allergies  Medications: I have reviewed the patient's current medications.  No results found for this or any previous visit (from the past 48 hour(s)).  No results found.  ROS - all of the below systems have been reviewed with the patient and positives are indicated with bold text General: chills, fever or night sweats Eyes: blurry vision or double vision ENT: epistaxis or sore throat Allergy/Immunology: itchy/watery eyes or nasal congestion Hematologic/Lymphatic: bleeding problems, blood clots or swollen lymph nodes Endocrine: temperature intolerance or unexpected weight changes Breast: new or changing breast lumps or nipple discharge Resp: cough, shortness of breath, or wheezing CV: chest pain or dyspnea on exertion GI: as per HPI GU: dysuria, trouble voiding, or hematuria MSK: joint pain or joint stiffness Neuro: TIA or stroke symptoms Derm: pruritus and skin lesion changes Psych: anxiety and depression  PE Blood pressure (!) 132/95, pulse 65, temperature 98.1 F (36.7 C), temperature source Oral, resp. rate (!) 24, height 6' (1.829 m), weight 72.6 kg, SpO2 100 %. Constitutional: NAD; conversant; no deformities Eyes: Moist conjunctiva; no lid lag; anicteric; PERRL Neck: Trachea midline; no thyromegaly Lungs: Normal respiratory effort; no tactile fremitus CV: RRR; no palpable thrills; no pitting edema GI: Abd soft; no palpable hepatosplenomegaly MSK: Normal range of motion of extremities; no clubbing/cyanosis Psychiatric: Appropriate affect; alert  and oriented x3 Lymphatic: No palpable cervical or axillary lymphadenopathy  No results found for this or any previous visit (from the past 48 hour(s)).  No results found.   A/P: Brandon Barry is an 44 y.o. male with anastomotic stricture after anastomotic leak.  Barium enema shows no further leak.  He is  here today for endoscopic evaluation and evaluation for possible balloon dilation in the future.   Rosario Adie, MD  Colorectal and Monument Surgery

## 2020-04-26 ENCOUNTER — Encounter (HOSPITAL_COMMUNITY): Payer: Self-pay | Admitting: General Surgery

## 2020-05-19 ENCOUNTER — Other Ambulatory Visit: Payer: Self-pay | Admitting: Gastroenterology

## 2020-05-27 ENCOUNTER — Other Ambulatory Visit: Payer: Self-pay

## 2020-06-01 ENCOUNTER — Other Ambulatory Visit (HOSPITAL_COMMUNITY)
Admission: RE | Admit: 2020-06-01 | Discharge: 2020-06-01 | Disposition: A | Discharge status: Home / Self Care | Payer: Commercial Managed Care - PPO | Source: Ambulatory Visit | Attending: Gastroenterology | Admitting: Gastroenterology

## 2020-06-01 DIAGNOSIS — Z01812 Encounter for preprocedural laboratory examination: Secondary | ICD-10-CM | POA: Insufficient documentation

## 2020-06-01 DIAGNOSIS — Z20822 Contact with and (suspected) exposure to covid-19: Secondary | ICD-10-CM | POA: Insufficient documentation

## 2020-06-01 LAB — SARS CORONAVIRUS 2 (TAT 6-24 HRS): SARS Coronavirus 2: NEGATIVE

## 2020-06-03 ENCOUNTER — Encounter (HOSPITAL_COMMUNITY)
Admission: RE | Disposition: A | Discharge status: Home / Self Care | Payer: Self-pay | Source: Home / Self Care | Attending: Gastroenterology

## 2020-06-03 ENCOUNTER — Ambulatory Visit (HOSPITAL_COMMUNITY): Payer: Commercial Managed Care - PPO

## 2020-06-03 ENCOUNTER — Ambulatory Visit (HOSPITAL_COMMUNITY)
Admission: RE | Admit: 2020-06-03 | Discharge: 2020-06-03 | Disposition: A | Discharge status: Home / Self Care | Payer: Commercial Managed Care - PPO | Attending: Gastroenterology | Admitting: Gastroenterology

## 2020-06-03 ENCOUNTER — Other Ambulatory Visit: Payer: Self-pay

## 2020-06-03 ENCOUNTER — Ambulatory Visit (HOSPITAL_COMMUNITY): Payer: Commercial Managed Care - PPO | Admitting: Registered Nurse

## 2020-06-03 ENCOUNTER — Encounter (HOSPITAL_COMMUNITY): Payer: Self-pay | Admitting: Gastroenterology

## 2020-06-03 DIAGNOSIS — Z85048 Personal history of other malignant neoplasm of rectum, rectosigmoid junction, and anus: Secondary | ICD-10-CM | POA: Insufficient documentation

## 2020-06-03 DIAGNOSIS — R933 Abnormal findings on diagnostic imaging of other parts of digestive tract: Secondary | ICD-10-CM | POA: Diagnosis not present

## 2020-06-03 DIAGNOSIS — Z809 Family history of malignant neoplasm, unspecified: Secondary | ICD-10-CM | POA: Diagnosis not present

## 2020-06-03 DIAGNOSIS — K624 Stenosis of anus and rectum: Secondary | ICD-10-CM | POA: Insufficient documentation

## 2020-06-03 DIAGNOSIS — Z87891 Personal history of nicotine dependence: Secondary | ICD-10-CM | POA: Diagnosis not present

## 2020-06-03 HISTORY — PX: BALLOON DILATION: SHX5330

## 2020-06-03 HISTORY — PX: FLEXIBLE SIGMOIDOSCOPY: SHX5431

## 2020-06-03 SURGERY — SIGMOIDOSCOPY, FLEXIBLE
Anesthesia: Monitor Anesthesia Care

## 2020-06-03 MED ORDER — FENTANYL CITRATE (PF) 100 MCG/2ML IJ SOLN
INTRAMUSCULAR | Status: AC
  Filled 2020-06-03: qty 2

## 2020-06-03 MED ORDER — SODIUM CHLORIDE 0.9 % IV SOLN: INTRAVENOUS | Status: DC

## 2020-06-03 MED ORDER — PROPOFOL 500 MG/50ML IV EMUL
INTRAVENOUS | Status: DC | PRN
  Administered 2020-06-03 (×4): 50 mg via INTRAVENOUS

## 2020-06-03 MED ORDER — ONDANSETRON HCL 4 MG/2ML IJ SOLN: 4.0000 mg | Freq: Once | INTRAMUSCULAR | Status: DC | PRN

## 2020-06-03 MED ORDER — PROPOFOL 500 MG/50ML IV EMUL
INTRAVENOUS | Status: DC | PRN
  Administered 2020-06-03: 225 ug/kg/min via INTRAVENOUS

## 2020-06-03 MED ORDER — FENTANYL CITRATE (PF) 100 MCG/2ML IJ SOLN
25.0000 ug | INTRAMUSCULAR | Status: DC | PRN
  Administered 2020-06-03: 25 ug via INTRAVENOUS

## 2020-06-03 MED ORDER — LACTATED RINGERS IV SOLN: INTRAVENOUS | Status: DC | PRN

## 2020-06-03 MED ORDER — MIDAZOLAM HCL 5 MG/5ML IJ SOLN
INTRAMUSCULAR | Status: DC | PRN
  Administered 2020-06-03: 2 mg via INTRAVENOUS

## 2020-06-03 MED ORDER — PROPOFOL 10 MG/ML IV BOLUS
INTRAVENOUS | Status: AC
  Filled 2020-06-03: qty 40

## 2020-06-03 MED ORDER — MIDAZOLAM HCL 2 MG/2ML IJ SOLN
INTRAMUSCULAR | Status: AC
  Filled 2020-06-03: qty 2

## 2020-06-03 MED ORDER — LACTATED RINGERS IV SOLN: Freq: Once | INTRAVENOUS | Status: AC

## 2020-06-03 NOTE — Transfer of Care (Signed)
Immediate Anesthesia Transfer of Care Note  Patient: Nydia Bouton III  Procedure(s) Performed: FLEXIBLE SIGMOIDOSCOPY (N/A ) BALLOON DILATION (N/A )  Patient Location: PACU and Endoscopy Unit  Anesthesia Type:MAC  Level of Consciousness: awake, alert  and oriented  Airway & Oxygen Therapy: Patient Spontanous Breathing and Patient connected to face mask  Post-op Assessment: Report given to RN and Post -op Vital signs reviewed and stable  Post vital signs: Reviewed and stable  Last Vitals:  Vitals Value Taken Time  BP 98/62 06/03/20 1450  Temp    Pulse 58 06/03/20 1452  Resp 18 06/03/20 1452  SpO2 98 % 06/03/20 1452  Vitals shown include unvalidated device data.  Last Pain:  Vitals:   06/03/20 1247  TempSrc: Oral  PainSc: 0-No pain         Complications: No complications documented.

## 2020-06-03 NOTE — Discharge Instructions (Signed)
YOU HAD AN ENDOSCOPIC PROCEDURE TODAY: Refer to the procedure report and other information in the discharge instructions given to you for any specific questions about what was found during the examination. If this information does not answer your questions, please call [facility] at [phone] to clarify.  ° °YOU SHOULD EXPECT: Some feelings of bloating in the abdomen. Passage of more gas than usual. Walking can help get rid of the air that was put into your GI tract during the procedure and reduce the bloating. If you had a lower endoscopy (such as a colonoscopy or flexible sigmoidoscopy) you may notice spotting of blood in your stool or on the toilet paper. Some abdominal soreness may be present for a day or two, also. ° °DIET: Your first meal following the procedure should be a light meal and then it is ok to progress to your normal diet. A half-sandwich or bowl of soup is an example of a good first meal. Heavy or fried foods are harder to digest and may make you feel nauseous or bloated. Drink plenty of fluids but you should avoid alcoholic beverages for 24 hours. If you had an esophageal dilation, please see attached information for diet.  ° °ACTIVITY: Your care partner should take you home directly after the procedure. You should plan to take it easy, moving slowly for the rest of the day. You can resume normal activity the day after the procedure however YOU SHOULD NOT DRIVE, use power tools, machinery or perform tasks that involve climbing or major physical exertion for 24 hours (because of the sedation medicines used during the test).  ° °SYMPTOMS TO REPORT IMMEDIATELY: °A gastroenterologist can be reached at any hour. Please call [phone]  for any of the following symptoms:  °Following lower endoscopy (colonoscopy, flexible sigmoidoscopy) °Excessive amounts of blood in the stool  °Significant tenderness, worsening of abdominal pains  °Swelling of the abdomen that is new, acute  °Fever of  100° or higher  °Black, tarry-looking or red, bloody stools ° °FOLLOW UP:  °If any biopsies were taken you will be contacted by phone or by letter within the next 1-3 weeks. Call [phone]  if you have not heard about the biopsies in 3 weeks.  °Please also call with any specific questions about appointments or follow up tests. ° °

## 2020-06-03 NOTE — Anesthesia Preprocedure Evaluation (Signed)
Anesthesia Evaluation  Patient identified by MRN, date of birth, ID band Patient awake    Reviewed: Allergy & Precautions, NPO status , Patient's Chart, lab work & pertinent test results  Airway Mallampati: I  TM Distance: >3 FB Neck ROM: Full    Dental no notable dental hx. (+) Teeth Intact   Pulmonary sleep apnea and Continuous Positive Airway Pressure Ventilation , former smoker,    Pulmonary exam normal breath sounds clear to auscultation       Cardiovascular negative cardio ROS Normal cardiovascular exam Rhythm:Regular Rate:Normal     Neuro/Psych PSYCHIATRIC DISORDERS Anxiety Depression Peripheral neuropathy Chemo induced  Neuromuscular disease    GI/Hepatic Neg liver ROS, Rectal Ca S/P low anterior resection S/P ChemoRx Anastomosis stricture    Endo/Other  Hyperlipidemia Hyperglycemia  Renal/GU negative Renal ROS  negative genitourinary   Musculoskeletal negative musculoskeletal ROS (+)   Abdominal   Peds  Hematology  (+) anemia ,   Anesthesia Other Findings   Reproductive/Obstetrics                             Anesthesia Physical Anesthesia Plan  ASA: II  Anesthesia Plan: MAC   Post-op Pain Management:    Induction: Intravenous  PONV Risk Score and Plan: Propofol infusion and Treatment may vary due to age or medical condition  Airway Management Planned: Natural Airway and Simple Face Mask  Additional Equipment:   Intra-op Plan:   Post-operative Plan: Extubation in OR  Informed Consent: I have reviewed the patients History and Physical, chart, labs and discussed the procedure including the risks, benefits and alternatives for the proposed anesthesia with the patient or authorized representative who has indicated his/her understanding and acceptance.     Dental advisory given  Plan Discussed with: CRNA and Anesthesiologist  Anesthesia Plan Comments:          Anesthesia Quick Evaluation

## 2020-06-03 NOTE — Anesthesia Postprocedure Evaluation (Signed)
Anesthesia Post Note  Patient: Brandon Barry  Procedure(s) Performed: FLEXIBLE SIGMOIDOSCOPY (N/A ) BALLOON DILATION (N/A )     Patient location during evaluation: Endoscopy Anesthesia Type: MAC Level of consciousness: awake and alert Pain management: pain level controlled Vital Signs Assessment: post-procedure vital signs reviewed and stable Respiratory status: spontaneous breathing, nonlabored ventilation, respiratory function stable and patient connected to nasal cannula oxygen Cardiovascular status: stable and blood pressure returned to baseline Postop Assessment: no apparent nausea or vomiting Anesthetic complications: no   No complications documented.  Last Vitals:  Vitals:   06/03/20 1500 06/03/20 1510  BP: (!) 98/52 107/73  Pulse: (!) 57 (!) 51  Resp: 19 15  Temp:    SpO2: 98% 98%    Last Pain:  Vitals:   06/03/20 1510  TempSrc:   PainSc: 0-No pain                 Brandon Barry

## 2020-06-03 NOTE — Op Note (Signed)
[facility] Patient Name: Brandon Barry Procedure Date: 06/03/2020 MRN: [id number] Attending MD: Carol Ada , MD Date of Birth: 05-25-1976 CSN: [id number] Age: 44 Admit Type: Inpatient Procedure:                Flexible Sigmoidoscopy Indications:              Abnormal CT of the GI tract Providers:                Carol Ada, MD, Nelia Shi, RN, Benay Pillow, RN, Fransico Setters Mbumina, Technician Referring MD:              Medicines:                Propofol per Anesthesia Complications:            No immediate complications. Estimated Blood Loss:     Estimated blood loss was minimal. Procedure:                Pre-Anesthesia Assessment:                           - Prior to the procedure, a History and Physical                            was performed, and patient medications and                            allergies were reviewed. The patient's tolerance of                            previous anesthesia was also reviewed. The risks                            and benefits of the procedure and the sedation                            options and risks were discussed with the patient.                            All questions were answered, and informed consent                            was obtained. Prior Anticoagulants: The patient has                            taken no previous anticoagulant or antiplatelet                            agents. ASA Grade Assessment: II - A patient with                            mild systemic disease. After reviewing the risks  and benefits, the patient was deemed in                            satisfactory condition to undergo the procedure.                           - Sedation was administered by an anesthesia                            professional. Deep sedation was attained.                           After obtaining informed consent, the scope was                             passed under direct vision. The [id number]-[id number]                            [id number] Olympus pediatric colonscope was                            introduced through the anus and advanced to the the                            sigmoid colon. The flexible sigmoidoscopy was                            accomplished without difficulty. The patient                            tolerated the procedure well. The quality of the                            bowel preparation was excellent. Scope In: 2:16:48 PM Scope Out: 2:39:58 PM Total Procedure Duration: 0 hours 23 minutes 10 seconds  Findings:      A benign-appearing, intrinsic moderate stenosis measuring 1 cm (in       length) x 1.4 cm (inner diameter) was found in the rectum and was       traversed. A TTS dilator was passed through the scope. Dilation with a       16.5 mm colonic balloon dilator was performed under fluoroscopic       guidance. The dilation site was examined following endoscope reinsertion       and showed moderate mucosal disruption and moderate improvement in       luminal narrowing. Estimated blood loss was minimal.      The pediatric endoscope was advanced into the rectum and beyond the       stenosis without any difficulty. Closer examination of the anastomotic       stricture showed that it was patent and 7 cm from the anal verge. A       rectal examination was performed and the stenosis was palpated with the       distal portion of the index finger. The area was hard and fixed from the       radiation treatment. An adult EGD endoscope was  then used and it passed       through the area with mild to moderate difficulty. There was a sharp       angulation at the anatomosis and the endoscope was felt passing over the       surgical staples. Balloon dilation was performed starting at 12 mm and       13.5 mm. At both diameters the balloon freely moved. At 15 mm, the       balloon still moved, but it was not as significant. The dilation  was       held for 1 minute. Deflation of the balloon and examination of the       anstomosis did show some dilation of the area, subjectively. A final       dilation diameter was achieved at 16.5 mm. The diameter was slowly and       incrementally increased with close observation of the anstomosis. At       16.5 mm the balloon was held in place for 1 minute. Fluoroscopy was       obtained at the 15 mm and 16.5 mm diameters, whiched showed a reduction       in the waisting of the balloon. After the final dilation the area       appeared more patent. A repeat DRE did show that the area was more       patent, but the area was very hard from the radiation. Impression:               - Stricture in the rectum. Dilated.                           - No specimens collected. Moderate Sedation:      Not Applicable - Patient had care per Anesthesia. Recommendation:           - Patient has a contact number available for                            emergencies. The signs and symptoms of potential                            delayed complications were discussed with the                            patient. Return to normal activities tomorrow.                            Written discharge instructions were provided to the                            patient.                           - Resume regular diet.                           - Repeat dilation in 3-4 weeks.                           - Two week phone update. Procedure Code(s):        ---  Professional ---                           [phone], Sigmoidoscopy, flexible; with                            transendoscopic balloon dilation                           [phone], Intraluminal dilation of strictures and/or                            obstructions (eg, esophagus), radiological                            supervision and interpretation Diagnosis Code(s):        --- Professional ---                           K62.4, Stenosis of anus and rectum                            R93.3, Abnormal findings on diagnostic imaging of                            other parts of digestive tract CPT copyright 2019 [employer]. All rights reserved. The codes documented in this report are preliminary and upon coder review may  be revised to meet current compliance requirements. Carol Ada, MD Carol Ada, MD 06/03/2020 3:00:53 PM This report has been signed electronically. Number of Addenda: 0

## 2020-06-03 NOTE — H&P (Signed)
Brandon Barry HPI: The patient was diagnosed with a moderately invasive rectal cancer on 02/04/2019. He sought treatment from [facility], but he ultimately ended up obtaining his treatment here in [place]. The neoadjuvant therapy was completed on 08/06/2019. His robotic LAR was performed on 10/15/2019 by Dr. [name] and it was peformed without difficulty. There was no evidence of metastatic disease pathologically. The anstomosis was at 9 cm from the anal verge. Discharge was on POD 6, but he was readmitted on 10/21/2019 for an anastomotic leak. This resulted in a prolonged hospitalization where he was treated with IV antibiotics, drains of his abscess and then a diverting ileostomy on 11/25/2019. He was able to be discharged home on 12/03/2019. A single contrast BE was obtained on 03/12/2020 and it was negative for any leaks, but there was evidence of a mild narrowing and light irregularity at the anastomosis. Dr. [name] performed a [employer] on 04/22/2020 with findings of a 9 mm x 50 cm long anastomotic stricture. He is referred back to the office to further evaluate and treat the stricture.     Past Medical History:  Diagnosis Date  . Chronic back pain greater than 3 months duration   . Hyperlipidemia   . OSA on CPAP   . Peripheral neuropathy    Hands and feet from Chemo  . Rectal adenocarcinoma ([place])   . Right shoulder pain     Past Surgical History:  Procedure Laterality Date  . APPLICATION OF WOUND VAC N/A 11/25/2019   Procedure: APPLICATION OF WOUND VAC AND DRAIN PLACEMENT;  Surgeon: Leighton Ruff, MD;  Location: [facility];  Service: General;  Laterality: N/A;  . COLONOSCOPY  02/04/2019  . FLEXIBLE SIGMOIDOSCOPY N/A 04/22/2020   Procedure: FLEXIBLE SIGMOIDOSCOPY;  Surgeon: Leighton Ruff, MD;  Location: [facility];  Service: Endoscopy;  Laterality: N/A;  . ILEOSTOMY Right 11/25/2019   Procedure: PLACEMENT OF ILEOSTOMY;  Surgeon: Leighton Ruff, MD;  Location: [facility];  Service: General;   Laterality: Right;  . IR CATHETER TUBE CHANGE  11/11/2019  . IR PATIENT EVAL TECH 0-60 MINS  12/22/2019  . KNEE ARTHROSCOPY Left 11/19/2018  . LAPAROTOMY N/A 11/25/2019   Procedure: Sela Hilding WITH OPEN PELVIC WASHOUT;  Surgeon: Leighton Ruff, MD;  Location: [facility];  Service: General;  Laterality: N/A;  . PORT-A-CATH REMOVAL N/A 10/15/2019   Procedure: PORT REMOVAL;  Surgeon: Leighton Ruff, MD;  Location: [facility];  Service: General;  Laterality: N/A;  . PORTACATH PLACEMENT Left 02/21/2019   Procedure: INSERTION PORT-A-CATH;  Surgeon: Johnathan Hausen, MD;  Location: [facility];  Service: General;  Laterality: Left;  . WISDOM TOOTH EXTRACTION    . XI ROBOTIC ASSISTED LOWER ANTERIOR RESECTION N/A 10/15/2019   Procedure: XI ROBOTIC ASSISTED LOWER ANTERIOR RESECTION, RIGID PROCTOSCOPY;  Surgeon: Leighton Ruff, MD;  Location: [facility];  Service: General;  Laterality: N/A;    Family History  Problem Relation Age of Onset  . Heart attack Father   . Cancer Paternal Grandmother   . Cancer Paternal Grandfather     Social History:  reports that he quit smoking about 16 years ago. His smoking use included cigarettes. He has never used smokeless tobacco. He reports current alcohol use. He reports that he does not use drugs.  Allergies: No Known Allergies  Medications:  Scheduled:  Continuous: . sodium chloride    . lactated ringers      No results found for this or any previous visit (from the past 24 hour(s)).   No results found.  ROS:  As stated above in the HPI otherwise negative.  Blood pressure (!) 131/97, pulse 71, temperature (!) 96.8 F (36 C), temperature source Oral, resp. rate 16, height 6' (1.829 m), weight 71.7 kg, SpO2 98 %.    PE: Gen: NAD, Alert and Oriented HEENT:  [place]/AT, EOMI Neck: Supple, no LAD Lungs: CTA Bilaterally CV: RRR without M/G/R ABD: Soft, NTND, +BS Ext: No C/C/E  Assessment/Plan: 1) Anastomotic stricture - FFS with dilation.  The patient's chart  was reviewed in detail. The FFS images by Dr. [name] does show a stenosis. Further examination and potential dilation of the area will be performed. The approach, risks, and benefits of the FFS with planned dilation were discussed in detail with the patient and his wife. The plan is to proceed with the evaluation/treatment. The plan will be to use fluoroscopy as it is unclear if the stenosis can be traversed with a pediatric endoscope.  Asianna Brundage D 06/03/2020, 12:58 PM

## 2020-06-05 ENCOUNTER — Encounter (HOSPITAL_COMMUNITY): Payer: Self-pay | Admitting: Gastroenterology

## 2020-06-15 ENCOUNTER — Other Ambulatory Visit: Payer: Self-pay | Admitting: Gastroenterology

## 2020-07-13 ENCOUNTER — Other Ambulatory Visit (HOSPITAL_COMMUNITY)
Admission: RE | Admit: 2020-07-13 | Discharge: 2020-07-13 | Disposition: A | Discharge status: Home / Self Care | Payer: Commercial Managed Care - PPO | Source: Ambulatory Visit | Attending: Gastroenterology | Admitting: Gastroenterology

## 2020-07-13 DIAGNOSIS — Z20822 Contact with and (suspected) exposure to covid-19: Secondary | ICD-10-CM | POA: Diagnosis not present

## 2020-07-13 DIAGNOSIS — Z01812 Encounter for preprocedural laboratory examination: Secondary | ICD-10-CM | POA: Diagnosis present

## 2020-07-13 LAB — SARS CORONAVIRUS 2 (TAT 6-24 HRS): SARS Coronavirus 2: NEGATIVE

## 2020-07-23 NOTE — Progress Notes (Signed)
Attempted to obtain medical history via telephone, unable to reach at this time. I left a voicemail to return pre surgical testing department's phone call.  

## 2020-07-27 ENCOUNTER — Other Ambulatory Visit (HOSPITAL_COMMUNITY)
Admission: RE | Admit: 2020-07-27 | Discharge: 2020-07-27 | Disposition: A | Discharge status: Home / Self Care | Payer: Commercial Managed Care - PPO | Source: Ambulatory Visit | Attending: Gastroenterology | Admitting: Gastroenterology

## 2020-07-27 DIAGNOSIS — Z20822 Contact with and (suspected) exposure to covid-19: Secondary | ICD-10-CM | POA: Insufficient documentation

## 2020-07-27 DIAGNOSIS — Z01812 Encounter for preprocedural laboratory examination: Secondary | ICD-10-CM | POA: Diagnosis present

## 2020-07-27 LAB — SARS CORONAVIRUS 2 (TAT 6-24 HRS): SARS Coronavirus 2: NEGATIVE

## 2020-07-29 ENCOUNTER — Other Ambulatory Visit: Payer: Self-pay

## 2020-07-30 ENCOUNTER — Ambulatory Visit (HOSPITAL_COMMUNITY)
Admission: RE | Admit: 2020-07-30 | Discharge: 2020-07-30 | Disposition: A | Discharge status: Home / Self Care | Payer: Commercial Managed Care - PPO | Attending: Gastroenterology | Admitting: Gastroenterology

## 2020-07-30 ENCOUNTER — Encounter (HOSPITAL_COMMUNITY)
Admission: RE | Disposition: A | Discharge status: Home / Self Care | Payer: Self-pay | Source: Home / Self Care | Attending: Gastroenterology

## 2020-07-30 ENCOUNTER — Ambulatory Visit (HOSPITAL_COMMUNITY): Payer: Commercial Managed Care - PPO | Admitting: Certified Registered"

## 2020-07-30 ENCOUNTER — Other Ambulatory Visit: Payer: Self-pay

## 2020-07-30 ENCOUNTER — Encounter (HOSPITAL_COMMUNITY): Payer: Self-pay | Admitting: Gastroenterology

## 2020-07-30 DIAGNOSIS — Z87891 Personal history of nicotine dependence: Secondary | ICD-10-CM | POA: Diagnosis present

## 2020-07-30 DIAGNOSIS — Z85048 Personal history of other malignant neoplasm of rectum, rectosigmoid junction, and anus: Secondary | ICD-10-CM | POA: Insufficient documentation

## 2020-07-30 DIAGNOSIS — K624 Stenosis of anus and rectum: Secondary | ICD-10-CM | POA: Diagnosis not present

## 2020-07-30 DIAGNOSIS — R933 Abnormal findings on diagnostic imaging of other parts of digestive tract: Secondary | ICD-10-CM | POA: Diagnosis present

## 2020-07-30 HISTORY — PX: BALLOON DILATION: SHX5330

## 2020-07-30 HISTORY — PX: FLEXIBLE SIGMOIDOSCOPY: SHX5431

## 2020-07-30 SURGERY — SIGMOIDOSCOPY, FLEXIBLE
Anesthesia: Monitor Anesthesia Care

## 2020-07-30 MED ORDER — LIDOCAINE 2% (20 MG/ML) 5 ML SYRINGE
INTRAMUSCULAR | Status: DC | PRN
  Administered 2020-07-30: 40 mg via INTRAVENOUS

## 2020-07-30 MED ORDER — PROPOFOL 10 MG/ML IV BOLUS
INTRAVENOUS | Status: DC | PRN
  Administered 2020-07-30 (×2): 20 mg via INTRAVENOUS
  Administered 2020-07-30: 40 mg via INTRAVENOUS
  Administered 2020-07-30: 20 mg via INTRAVENOUS
  Administered 2020-07-30: 40 mg via INTRAVENOUS
  Administered 2020-07-30 (×2): 20 mg via INTRAVENOUS
  Administered 2020-07-30: 40 mg via INTRAVENOUS
  Administered 2020-07-30: 20 mg via INTRAVENOUS

## 2020-07-30 MED ORDER — LACTATED RINGERS IV SOLN: INTRAVENOUS | Status: DC

## 2020-07-30 NOTE — Anesthesia Procedure Notes (Signed)
Procedure Name: MAC Date/Time: 07/30/2020 7:27 AM Performed by: Cynda Familia, CRNA Pre-anesthesia Checklist: Emergency Drugs available, Patient identified, Suction available, Patient being monitored and Timeout performed Patient Re-evaluated:Patient Re-evaluated prior to induction Oxygen Delivery Method: Simple face mask Placement Confirmation: positive ETCO2 and breath sounds checked- equal and bilateral Dental Injury: Teeth and Oropharynx as per pre-operative assessment

## 2020-07-30 NOTE — H&P (Signed)
  Brandon Barry HPI: The patient is here for a repeat dilation of the anastomotic stricture.  He denies any noticeable improvement since the initial dilation.  Past Medical History:  Diagnosis Date  . Chronic back pain greater than 3 months duration   . Hyperlipidemia   . OSA on CPAP   . Peripheral neuropathy    Hands and feet from Chemo  . Rectal adenocarcinoma ([place])   . Right shoulder pain     Past Surgical History:  Procedure Laterality Date  . APPLICATION OF WOUND VAC N/A 11/25/2019   Procedure: APPLICATION OF WOUND VAC AND DRAIN PLACEMENT;  Surgeon: Brandon Ruff, MD;  [facility];  Service: General;  Laterality: N/A;  . BALLOON DILATION N/A 06/03/2020   Procedure: Brandon Barry;  Surgeon: Brandon Ada, MD;  [facility];  Service: Endoscopy;  Laterality: N/A;  . COLONOSCOPY  02/04/2019  . FLEXIBLE SIGMOIDOSCOPY N/A 04/22/2020   Procedure: FLEXIBLE SIGMOIDOSCOPY;  Surgeon: Brandon Ruff, MD;  [facility];  Service: Endoscopy;  Laterality: N/A;  . FLEXIBLE SIGMOIDOSCOPY N/A 06/03/2020   Procedure: FLEXIBLE SIGMOIDOSCOPY;  Surgeon: Brandon Ada, MD;  [facility];  Service: Endoscopy;  Laterality: N/A;  . ILEOSTOMY Right 11/25/2019   Procedure: PLACEMENT OF ILEOSTOMY;  Surgeon: Brandon Ruff, MD;  [facility];  Service: General;  Laterality: Right;  . IR CATHETER TUBE CHANGE  11/11/2019  . IR PATIENT EVAL TECH 0-60 MINS  12/22/2019  . KNEE ARTHROSCOPY Left 11/19/2018  . LAPAROTOMY N/A 11/25/2019   Procedure: Brandon Barry WITH OPEN PELVIC WASHOUT;  Surgeon: Brandon Ruff, MD;  [facility]: [facility];  Service: General;  Laterality: N/A;  . PORT-A-CATH REMOVAL N/A 10/15/2019   Procedure: PORT REMOVAL;  Surgeon: Brandon Ruff, MD;  Location: [facility];  Service: General;  Laterality: N/A;  . PORTACATH PLACEMENT Left 02/21/2019   Procedure: INSERTION PORT-A-CATH;  Surgeon: Brandon Hausen, MD;  [facility]: [facility];  Service: General;   Laterality: Left;  . WISDOM TOOTH EXTRACTION    . XI ROBOTIC ASSISTED LOWER ANTERIOR RESECTION N/A 10/15/2019   Procedure: XI ROBOTIC ASSISTED LOWER ANTERIOR RESECTION, RIGID PROCTOSCOPY;  Surgeon: Brandon Ruff, MD;  Location: [facility];  Service: General;  Laterality: N/A;    Family History  Problem Relation Age of Onset  . Heart attack Father   . Cancer Paternal Grandmother   . Cancer Paternal Grandfather     Social History:  reports that he quit smoking about 17 years ago. His smoking use included cigarettes. He has never used smokeless tobacco. He reports current alcohol use. He reports that he does not use drugs.  Allergies: No Known Allergies  Medications:  Scheduled:  Continuous: . lactated ringers 20 mL/hr at 07/30/20 0708    No results found for this or any previous visit (from the past 24 hour(s)).   No results found.  ROS:  As stated above in the HPI otherwise negative.  Blood pressure 128/90, pulse (!) 54, temperature 98.3 F (36.8 C), temperature source Oral, resp. rate 13, height 6' (1.829 m), weight 72.6 kg, SpO2 100 %.    PE: Gen: NAD, Alert and Oriented HEENT:  [place]/AT, EOMI Neck: Supple, no LAD Lungs: CTA Bilaterally CV: RRR without M/G/R ABD: Soft, NTND, +BS Ext: No C/C/E  Assessment/Plan: 1) Anastomotic stricture - FFS with balloon dilation.  Brandon Barry D 07/30/2020, 7:27 AM

## 2020-07-30 NOTE — Transfer of Care (Signed)
Immediate Anesthesia Transfer of Care Note  Patient: Brandon Barry  Procedure(s) Performed: FLEXIBLE SIGMOIDOSCOPY (N/A ) BALLOON DILATION (N/A )  Patient Location: PACU and Endoscopy Unit  Anesthesia Type:MAC  Level of Consciousness: awake and alert   Airway & Oxygen Therapy: Patient Spontanous Breathing and Patient connected to face mask oxygen  Post-op Assessment: Report given to RN and Post -op Vital signs reviewed and stable  Post vital signs: Reviewed and stable  Last Vitals:  Vitals Value Taken Time  BP    Temp    Pulse    Resp    SpO2      Last Pain:  Vitals:   07/30/20 0656  TempSrc: Oral  PainSc: 0-No pain         Complications: No complications documented.

## 2020-07-30 NOTE — Anesthesia Preprocedure Evaluation (Signed)
Anesthesia Evaluation  Patient identified by MRN, date of birth, ID band Patient awake    Reviewed: Allergy & Precautions, NPO status , Patient's Chart, lab work & pertinent test results  Airway Mallampati: II  TM Distance: >3 FB Neck ROM: Full    Dental  (+) Teeth Intact, Dental Advisory Given   Pulmonary sleep apnea and Continuous Positive Airway Pressure Ventilation , former smoker,    Pulmonary exam normal breath sounds clear to auscultation       Cardiovascular negative cardio ROS Normal cardiovascular exam Rhythm:Regular Rate:Normal     Neuro/Psych PSYCHIATRIC DISORDERS Anxiety Depression  Neuromuscular disease    GI/Hepatic Neg liver ROS, anostomatic stricture Rectal adenocarcinoma    Endo/Other  negative endocrine ROS  Renal/GU negative Renal ROS     Musculoskeletal negative musculoskeletal ROS (+)   Abdominal   Peds  Hematology negative hematology ROS (+)   Anesthesia Other Findings Day of surgery medications reviewed with the patient.  Reproductive/Obstetrics                             Anesthesia Physical Anesthesia Plan  ASA: II  Anesthesia Plan: MAC   Post-op Pain Management:    Induction: Intravenous  PONV Risk Score and Plan: 1 and Propofol infusion and Treatment may vary due to age or medical condition  Airway Management Planned: Nasal Cannula and Natural Airway  Additional Equipment:   Intra-op Plan:   Post-operative Plan:   Informed Consent: I have reviewed the patients History and Physical, chart, labs and discussed the procedure including the risks, benefits and alternatives for the proposed anesthesia with the patient or authorized representative who has indicated his/her understanding and acceptance.     Dental advisory given  Plan Discussed with: CRNA and Anesthesiologist  Anesthesia Plan Comments:         Anesthesia Quick Evaluation

## 2020-07-30 NOTE — Anesthesia Postprocedure Evaluation (Signed)
Anesthesia Post Note  Patient: Brandon Barry  Procedure(s) Performed: FLEXIBLE SIGMOIDOSCOPY (N/A ) BALLOON DILATION (N/A )     Patient location during evaluation: PACU Anesthesia Type: MAC Level of consciousness: awake and alert Pain management: pain level controlled Vital Signs Assessment: post-procedure vital signs reviewed and stable Respiratory status: spontaneous breathing, nonlabored ventilation, respiratory function stable and patient connected to nasal cannula oxygen Cardiovascular status: stable and blood pressure returned to baseline Postop Assessment: no apparent nausea or vomiting Anesthetic complications: no   No complications documented.  Last Vitals:  Vitals:   07/30/20 0810 07/30/20 0820  BP: 116/75 108/71  Pulse: (!) 54 (!) 51  Resp: 14 18  Temp:    SpO2: 99% 99%    Last Pain:  Vitals:   07/30/20 0820  TempSrc:   PainSc: 0-No pain                 Catalina Gravel

## 2020-07-30 NOTE — Op Note (Signed)
[facility] Patient Name: Brandon Barry Procedure Date: 07/30/2020 MRN: [id number] Attending MD: Carol Ada , MD Date of Birth: 06/25/1975 CSN: [id number] Age: 45 Admit Type: Outpatient Procedure:                Flexible Sigmoidoscopy Indications:              Abnormal abdominal x-ray of the GI tract Providers:                Carol Ada, MD, Cleda Daub, RN, Cherylynn Ridges,                            Technician, Glenis Smoker, CRNA Referring MD:              Medicines:                Propofol per Anesthesia Complications:            No immediate complications. Estimated Blood Loss:     Estimated blood loss was minimal. Procedure:                Pre-Anesthesia Assessment:                           - Prior to the procedure, a History and Physical                            was performed, and patient medications and                            allergies were reviewed. The patient's tolerance of                            previous anesthesia was also reviewed. The risks                            and benefits of the procedure and the sedation                            options and risks were discussed with the patient.                            All questions were answered, and informed consent                            was obtained. Prior Anticoagulants: The patient has                            taken no previous anticoagulant or antiplatelet                            agents. ASA Grade Assessment: II - A patient with                            mild systemic disease. After reviewing the risks  and benefits, the patient was deemed in                            satisfactory condition to undergo the procedure.                           - Sedation was administered by an anesthesia                            professional. Deep sedation was attained.                           After obtaining informed consent, the scope was                             passed under direct vision. The [id number]                            Olympus gastroscope was introduced through the anus                            and advanced to the the descending colon. The                            flexible sigmoidoscopy was accomplished without                            difficulty. The patient tolerated the procedure                            well. The quality of the bowel preparation was good. Scope In: 7:37:06 AM Scope Out: 7:49:48 AM Total Procedure Duration: 0 hours 12 minutes 42 seconds  Findings:      A benign-appearing, intrinsic moderate stenosis measuring <1 cm (in       length) x 1.4 cm (inner diameter) was found in the rectum and was       traversed. A TTS dilator was passed through the scope. Dilation with a       16.5 mm colonic balloon dilator was performed.      the mucosa in the remnant rectum was friable. At the anastomosis the       stricture was again identified. The endoscope was able to pass through       the area, but there was a sharp angulation. These findings have not       changed from the prior FFS. Balloon dilation was started at 15 mm and       held for 30 seconds. Subsequently the balloon was inflated to 16.5 mm       and held for one minute. Examination of the stricture did not show any       noticeable/significant dilation. The rectal examination confirmed the       rigidity of the stricture. Impression:               - Stricture in the rectum. Dilated.                           -  No specimens collected. Moderate Sedation:      Not Applicable - Patient had care per Anesthesia. Recommendation:           - Patient has a contact number available for                            emergencies. The signs and symptoms of potential                            delayed complications were discussed with the                            patient. Return to normal activities tomorrow.                            Written discharge  instructions were provided to the                            patient.                           - Resume regular diet.                           - Further management per Dr. [name]. Procedure Code(s):        --- Professional ---                           [phone], Sigmoidoscopy, flexible; with                            transendoscopic balloon dilation Diagnosis Code(s):        --- Professional ---                           K62.4, Stenosis of anus and rectum                           R93.3, Abnormal findings on diagnostic imaging of                            other parts of digestive tract CPT copyright 2019 [employer]. All rights reserved. The codes documented in this report are preliminary and upon coder review may  be revised to meet current compliance requirements. Carol Ada, MD Carol Ada, MD 07/30/2020 8:09:55 AM This report has been signed electronically. Number of Addenda: 0

## 2020-07-30 NOTE — Discharge Instructions (Signed)

## 2020-08-03 ENCOUNTER — Encounter (HOSPITAL_COMMUNITY): Payer: Self-pay | Admitting: Gastroenterology

## 2020-08-18 ENCOUNTER — Ambulatory Visit
Admission: RE | Admit: 2020-08-18 | Discharge: 2020-08-18 | Disposition: A | Discharge status: Home / Self Care | Payer: Commercial Managed Care - PPO | Source: Ambulatory Visit | Attending: Oncology | Admitting: Oncology

## 2020-08-18 DIAGNOSIS — C20 Malignant neoplasm of rectum: Secondary | ICD-10-CM

## 2020-08-18 MED ORDER — IOPAMIDOL (ISOVUE-300) INJECTION 61%
100.0000 mL | Freq: Once | INTRAVENOUS | Status: AC | PRN
  Administered 2020-08-18: 100 mL via INTRAVENOUS

## 2020-08-20 ENCOUNTER — Other Ambulatory Visit: Payer: Self-pay

## 2020-08-20 ENCOUNTER — Inpatient Hospital Stay: Payer: Commercial Managed Care - PPO | Attending: Nurse Practitioner

## 2020-08-20 DIAGNOSIS — D1803 Hemangioma of intra-abdominal structures: Secondary | ICD-10-CM | POA: Insufficient documentation

## 2020-08-20 DIAGNOSIS — Z85048 Personal history of other malignant neoplasm of rectum, rectosigmoid junction, and anus: Secondary | ICD-10-CM | POA: Insufficient documentation

## 2020-08-20 DIAGNOSIS — Z79899 Other long term (current) drug therapy: Secondary | ICD-10-CM | POA: Insufficient documentation

## 2020-08-20 DIAGNOSIS — G620 Drug-induced polyneuropathy: Secondary | ICD-10-CM | POA: Diagnosis not present

## 2020-08-20 DIAGNOSIS — G893 Neoplasm related pain (acute) (chronic): Secondary | ICD-10-CM | POA: Insufficient documentation

## 2020-08-20 DIAGNOSIS — Z923 Personal history of irradiation: Secondary | ICD-10-CM | POA: Insufficient documentation

## 2020-08-20 DIAGNOSIS — R599 Enlarged lymph nodes, unspecified: Secondary | ICD-10-CM | POA: Diagnosis not present

## 2020-08-20 DIAGNOSIS — G629 Polyneuropathy, unspecified: Secondary | ICD-10-CM | POA: Diagnosis not present

## 2020-08-20 DIAGNOSIS — C20 Malignant neoplasm of rectum: Secondary | ICD-10-CM

## 2020-08-20 LAB — BASIC METABOLIC PANEL - CANCER CENTER ONLY
Anion gap: 8 (ref 5–15)
BUN: 13 mg/dL (ref 6–20)
CO2: 24 mmol/L (ref 22–32)
Calcium: 9 mg/dL (ref 8.9–10.3)
Chloride: 105 mmol/L (ref 98–111)
Creatinine: 1.05 mg/dL (ref 0.61–1.24)
GFR, Estimated: >60 mL/min (ref >60)
Glucose, Bld: 102 mg/dL — ABNORMAL HIGH (ref 70–99)
Potassium: 4.6 mmol/L (ref 3.5–5.1)
Sodium: 137 mmol/L (ref 135–145)

## 2020-08-20 LAB — CEA (IN HOUSE-CHCC): CEA (CHCC-In House): 1.83 ng/mL (ref 0.00–5.00)

## 2020-08-25 ENCOUNTER — Inpatient Hospital Stay (HOSPITAL_BASED_OUTPATIENT_CLINIC_OR_DEPARTMENT_OTHER): Payer: Commercial Managed Care - PPO | Admitting: Nurse Practitioner

## 2020-08-25 ENCOUNTER — Encounter: Payer: Self-pay | Admitting: Nurse Practitioner

## 2020-08-25 ENCOUNTER — Other Ambulatory Visit: Payer: Self-pay

## 2020-08-25 VITALS — BP 134/82 | HR 68 | Temp 98.2°F | Resp 17 | Ht 72.0 in | Wt 169.1 lb

## 2020-08-25 DIAGNOSIS — C20 Malignant neoplasm of rectum: Secondary | ICD-10-CM

## 2020-08-25 DIAGNOSIS — Z85048 Personal history of other malignant neoplasm of rectum, rectosigmoid junction, and anus: Secondary | ICD-10-CM | POA: Diagnosis not present

## 2020-08-25 NOTE — Progress Notes (Signed)
[facility] OFFICE PROGRESS NOTE   Diagnosis: Rectal cancer  INTERVAL HISTORY:   Brandon Barry returns as scheduled.  Overall he feels well.  Neuropathy symptoms continue to improve.  He has a good appetite.  No nausea or vomiting.  He empties the ileostomy about 7 times a day.  He notes improvement when he takes Imodium.  Objective:  Vital signs in last 24 hours:  Blood pressure 134/82, pulse 68, temperature 98.2 F (36.8 C), temperature source Tympanic, resp. rate 17, height 6' (1.829 m), weight 169 lb 1.6 oz (76.7 kg), SpO2 100 %.    HEENT: Neck without mass. Lymphatics: No palpable cervical, supraclavicular, axillary or inguinal lymph nodes. Resp: Lungs clear bilaterally. Cardio: Regular rate and rhythm. GI: Abdomen soft and nontender.  Right abdomen ileostomy.  No hepatomegaly. Vascular: No leg edema.   Lab Results:  Lab Results  Component Value Date   WBC 8.2 11/29/2019   HGB 8.2 (L) 11/29/2019   HCT 25.9 (L) 11/29/2019   MCV 89.9 11/29/2019   PLT 381 11/29/2019   NEUTROABS 4.0 11/24/2019    Imaging:  No results found.  Medications: I have reviewed the patient's current medications.  Assessment/Plan: 1. Rectosigmoid cancer   Colonoscopy by Dr. Benson Barry 02/04/2019-fungating infiltrative and ulcerated nonobstructing large mass in the rectum. The mass was circumferential measuring 3 cm in length. There was no bleeding present. The mass was 10 cm from the anal verge. Biopsy showed invasive adenocarcinoma, moderately differentiated; MLH1, MSH2, MSH6 and PMS2 intact.   CTs abdomen and pelvis 02/04/2019-apparent colorectal mass measuring 3.9 x 5.4 x 3.2 cm involving the distal sigmoid colon and proximal rectum with adjacent borderline enlarged suspicious mesorectal lymph nodes; indeterminate lesion in segment 6 of the liver measuring 1.4 x 1.1 cm.  MRI pelvis 02/13/2019-T3c, N1 tumor at 11.5 cm from the anal verge, 6.7 cm from the internal anal sphincter, 3  lymph nodes greater than 5 mm, largest 8 mm  CT chest 02/13/2019-negative for metastatic disease  MRI liver 02/13/2019-3.6 cm hemangioma in the posterior right hepatic lobe, 10 mm indeterminate lesion in the posterior right hepatic lobe-potentially an atypical hemangioma-solitary metastasis not excluded  Cycle 1 FOLFOX 02/26/2019  Cycle 2 FOLFOX 03/13/2019, Emend added  Cycle 3 FOLFOX 03/27/2019  Cycle 4 FOLFOX 04/10/2019  Cycle 5 FOLFOX 04/24/2019  Cycle 6 FOLFOX 05/07/2019  Cycle 7 FOLFOX 05/22/2019 (5-fluorouracil dose reduced)  Cycle 8 FOLFOX 06/09/2019  Capecitabine/radiation 06/30/2019-07/06/2019  Capecitabine placed on hold with the p.m. dose on 07/30/2019, 1000 mg twice daily  Capecitabine resumed with the p.m. dose on 08/04/2019 with a dose reduction  CT liver 08/11/2019-stable subcapsular hemangioma right hepatic lobe.  Stable small indeterminate right hepatic lobe lesion unchanged in size from CT 02/04/2019.  10/15/2019, robotic assisted low anterior resection/Port-A-Cath removal, adenocarcinoma of distal sigmoid/proximal rectum invading pericolonic connective tissue, negative resection margins, 0/24 nodes,[id number], treatment effect present-tumor associated fibrosis   CT abdomen/pelvis 10/31/2019-anastomotic breakdown at the rectum, extraluminal stool, right lower quadrant abscess, small bowel obstruction, air within the bladder lumen, stable subcentimeter right hepatic nodule  CT abdomen/pelvis 11/15/2019-stable 1 cm segment 7 lesion, no new liver lesions, anastomotic breakdown at the rectum with decreased size of complex surrounding gas/fluid collection in good position of transgluteal drain, persistent small bowel obstruction  CT pelvis 02/27/2020-no measurable perianastomotic fluid collection or free air, no evidence of metastatic disease  CTs 08/19/2020-stable 2 mm left upper lobe pulmonary nodule.  No other suspicious appearing pulmonary nodules or masses noted.  Cavernous  hemangiomas in the liver, similar to the prior examinations.  Postoperative changes with what appears to be some postoperative scarring in the presacral space adjacent to the suture line at the rectosigmoid anastomosis. 2. Change in bowel habits/rectal pain secondary to #1, improved 3. Port-A-Cath placement, Dr. Hassell Barry, 02/21/2019 4. Early oxaliplatin neuropathy-trial of Neurontin initiated 08/25/2019 due to foot pain, improved 5. Pain secondary to hand/foot syndrome, radiation skin toxicity, and radiation proctitis-improved 6. Admission with intra-abdominal abscess/anastomotic dehiscence, surgery 11/25/2019- exploratory laparotomy, placement of wound VAC and drain, diverting loop ileostomy    Disposition: [name] remains in remission from rectal cancer.  He continues follow-up with Dr. Marcello Barry for timing of reversal of the ileostomy.  We discussed a referral to the genetics counselor due to to his age at time of diagnosis of the rectal cancer.  He agrees.  Referral placed.  Neuropathy symptoms continue to improve.  He will return for a CEA and follow-up visit in 6 months.    Ned Card ANP/GNP-BC   08/25/2020  10:02 AM

## 2020-08-26 ENCOUNTER — Telehealth: Payer: Self-pay | Admitting: Oncology

## 2020-08-26 NOTE — Telephone Encounter (Signed)
Scheduled per 03/16 scheduled message, patient has been called and notified. 

## 2020-09-02 ENCOUNTER — Other Ambulatory Visit: Payer: Self-pay | Admitting: Licensed Clinical Social Worker

## 2020-09-02 ENCOUNTER — Telehealth: Payer: Self-pay | Admitting: Oncology

## 2020-09-02 DIAGNOSIS — C20 Malignant neoplasm of rectum: Secondary | ICD-10-CM

## 2020-09-02 NOTE — Telephone Encounter (Signed)
Cancelled 3/28 appts per patient request. Patient will call back to r/s

## 2020-09-06 ENCOUNTER — Other Ambulatory Visit: Payer: Commercial Managed Care - PPO

## 2020-09-06 ENCOUNTER — Encounter: Payer: Commercial Managed Care - PPO | Admitting: Licensed Clinical Social Worker

## 2020-09-13 ENCOUNTER — Ambulatory Visit: Payer: Self-pay | Admitting: General Surgery

## 2020-09-13 NOTE — H&P (Signed)
The patient is a 45 year old male who presents with colorectal cancer. Pt who presented to the office with a new diagnosis of rectal cancer last year. He had been having abdominal pain and weight loss. A colonoscopy was performed which showed a mid rectal mass. Biopsies revealed adenocarcinoma. CT abdomen and pelvis showed a rectal mass and a possible liver mass. MRI of the liver is indeterminate hemangioma versus metastatic disease. CT chest was negative. He underwent total neoadjuvant chemotherapy with radiation afterwards.   Patient underwent robotic-assisted low anterior resection on 10/15/19. He presented back to the hospital with abdominal pain and was noted have an anastomotic leak approximally, 3 weeks status post original surgery. He was placed on TPN and nothing by mouth. He was taken back to the operating room in mid June for diverting ileostomy. An intraoperative and CT-guided drain were placed to clear his abscess. He is gaining weight. He is tolerating a diet without difficulty. He is having no trouble with his ostomy leaking since changing bag types. He is using Imodium as needed to keep his outputs thick. He is up-to-date with his cancer surveillance. We attempted endoscopic balloon dilation with Dr. Benson Norway. No progress was able to be made.   Problem List/Past Medical Leighton Ruff, MD; 09/13/1538 5:04 PM) INTRA-ABDOMINAL ABSCESS (K65.1) VISIT FOR WOUND CHECK (G86.76) COMPLICATION OF SURGICAL AND MEDICAL CARE, UNSPECIFIED, INITIAL ENCOUNTER (T88.9XXA) ANASTOMOTIC LEAK OF INTESTINE (K91.89) FATIGUE (R53.83) ILEOSTOMY IN PLACE (Z93.2)  Past Surgical History Leighton Ruff, MD; 06/21/5091 5:04 PM) Knee Surgery Left. Oral Surgery  Diagnostic Studies History Leighton Ruff, MD; 07/18/7122 5:04 PM) Colonoscopy within last year  Allergies Mammie Lorenzo, LPN; 10/17/996 3:38 PM) No Known Drug Allergies [02/18/2019]: Allergies Reconciled  Medication History Mammie Lorenzo, LPN; 07/17/537 7:67 PM) Loperamide HCl (2MG  Capsule, Oral) Active. Medications Reconciled  Social History Leighton Ruff, MD; 08/14/1935 5:04 PM) Alcohol use Occasional alcohol use. Caffeine use Coffee, Tea. No drug use Tobacco use Former smoker.  Family History Leighton Ruff, MD; 9/0/2409 5:04 PM) Colon Polyps Father. Hypertension Father. Migraine Headache Mother.  Other Problems Leighton Ruff, MD; 12/13/5327 5:04 PM) Hemorrhoids Rectal Cancer Sleep Apnea RECTAL CANCER (C20)     Review of Systems Leighton Ruff MD; 02/12/4267 5:04 PM) General Present- Fatigue and Weight Loss. Not Present- Appetite Loss, Chills, Fever, Night Sweats and Weight Gain. Skin Not Present- Change in Wart/Mole, Dryness, Hives, Jaundice, New Lesions, Non-Healing Wounds, Rash and Ulcer. HEENT Present- Seasonal Allergies. Not Present- Earache, Hearing Loss, Hoarseness, Nose Bleed, Oral Ulcers, Ringing in the Ears, Sinus Pain, Sore Throat, Visual Disturbances, Wears glasses/contact lenses and Yellow Eyes. Respiratory Not Present- Bloody sputum, Chronic Cough, Difficulty Breathing, Snoring and Wheezing. Breast Not Present- Breast Mass, Breast Pain, Nipple Discharge and Skin Changes. Cardiovascular Not Present- Chest Pain, Difficulty Breathing Lying Down, Leg Cramps, Palpitations, Rapid Heart Rate, Shortness of Breath and Swelling of Extremities. Gastrointestinal Present- Abdominal Pain, Change in Bowel Habits and Rectal Pain. Not Present- Bloating, Bloody Stool, Chronic diarrhea, Constipation, Difficulty Swallowing, Excessive gas, Gets full quickly at meals, Hemorrhoids, Indigestion, Nausea and Vomiting. Male Genitourinary Not Present- Blood in Urine, Change in Urinary Stream, Frequency, Impotence, Nocturia, Painful Urination, Urgency and Urine Leakage. Musculoskeletal Not Present- Back Pain, Joint Pain, Joint Stiffness, Muscle Pain, Muscle Weakness and Swelling of Extremities. Neurological Not  Present- Decreased Memory, Fainting, Headaches, Numbness, Seizures, Tingling, Tremor, Trouble walking and Weakness. Psychiatric Not Present- Anxiety, Bipolar, Change in Sleep Pattern, Depression, Fearful and Frequent crying. Endocrine Not Present- Cold Intolerance, Excessive Hunger, Hair  Changes, Heat Intolerance and New Diabetes. Hematology Not Present- Blood Thinners, Easy Bruising, Excessive bleeding, Gland problems, HIV and Persistent Infections.   Physical Exam Leighton Ruff MD; 08/17/8586 5:05 PM)  General Mental Status-Alert. General Appearance-Cooperative.  Abdomen Palpation/Percussion Palpation and Percussion of the abdomen reveal - Soft and Non Tender.    Assessment & Plan Leighton Ruff MD; 5/0/2774 4:44 PM)  ANASTOMOTIC LEAK OF INTESTINE (K91.89) Impression: 45 year old male proximally one-year status post anastomotic leak after a proximal low anterior resection. He is diverted with an ileostomy. We have tried to dilate his anastomosis with endoscopic balloons with no success. I have recommended redo resection and most likely coloanal anastomosis. We have discussed this in detail including the risk of redo pelvic surgery. All questions were answered. I have recommended firefighter injection by urology to identify the ureters during our dissection. We will also plan on doing injection of firefighter intravenously for evaluation of perfusion. All questions were answered. I believe he completely understands the risk of the procedure and agrees to proceed with surgery. The surgery and anatomy were described to the patient as well as the risks of surgery and the possible complications. These include: Bleeding, deep abdominal infections and possible wound complications such as hernia and infection, damage to adjacent structures, leak of surgical connections, which can lead to other surgeries and possibly an ostomy, possible need for other procedures, such as abscess drains in  radiology, possible prolonged hospital stay, possible diarrhea from removal of part of the colon, possible constipation from narcotics, possible bowel, bladder or sexual dysfunction if having rectal surgery, prolonged fatigue/weakness or appetite loss, possible early recurrence of of disease, possible complications of their medical problems such as heart disease or arrhythmias or lung problems, death (less than 1%). I believe the patient understands and wishes to proceed with the surgery.

## 2020-09-16 IMAGING — CT CT CHEST W/ CM
2 of 3 series · 15 of 36 positions shown, 18 images · IV contrast (omnipaque)
Comparison: No priors.

CLINICAL DATA: 43-year-old male with history of rectal cancer.
Staging examination.

EXAM:
CT CHEST WITH CONTRAST
TECHNIQUE: Multidetector CT imaging of the chest was performed during
intravenous contrast administration.
CONTRAST:  75mL OMNIPAQUE IOHEXOL 300 MG/ML  SOLN

[Series 2: axial st · axial · 0.71mm/px · z∈[+1159,+1473]mm · 12 of 185 slices shown, 15 images]
[im 14/185  mediastinal]
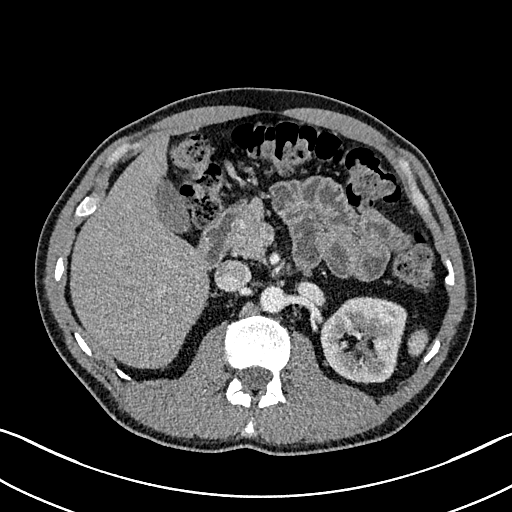
[im 14/185  lung]
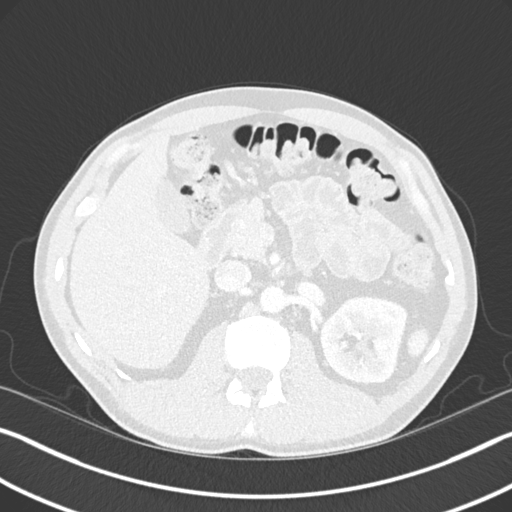
[im 28/185  lung]
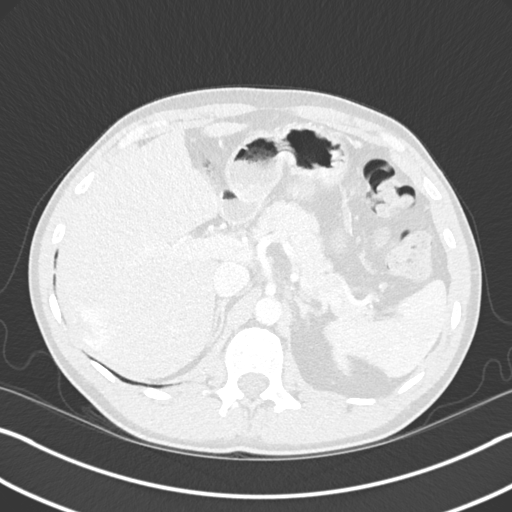
[im 41/185  lung]
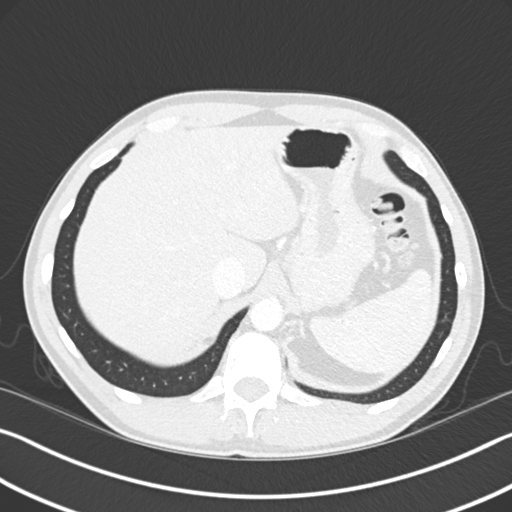
[im 55/185  lung]
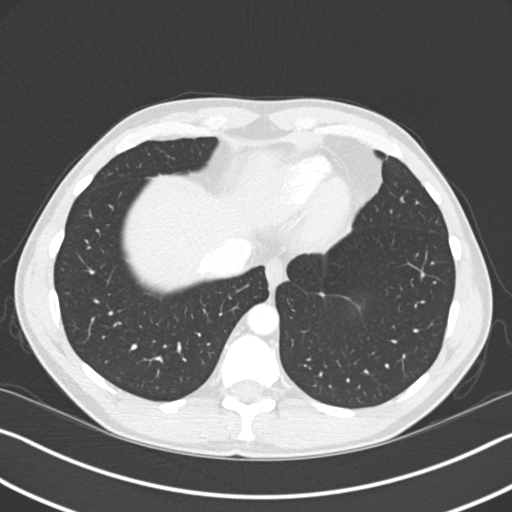
[im 69/185  mediastinal]
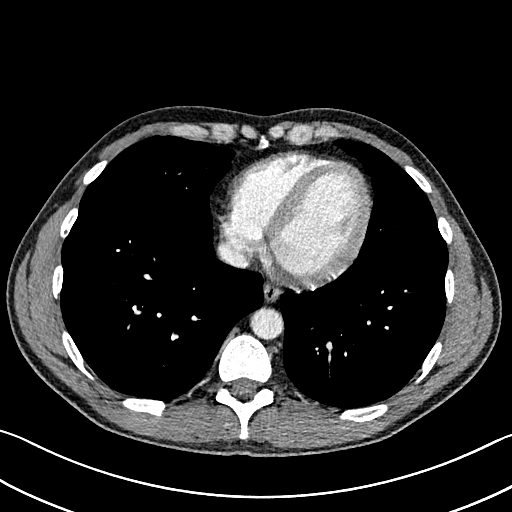
[im 69/185  lung]
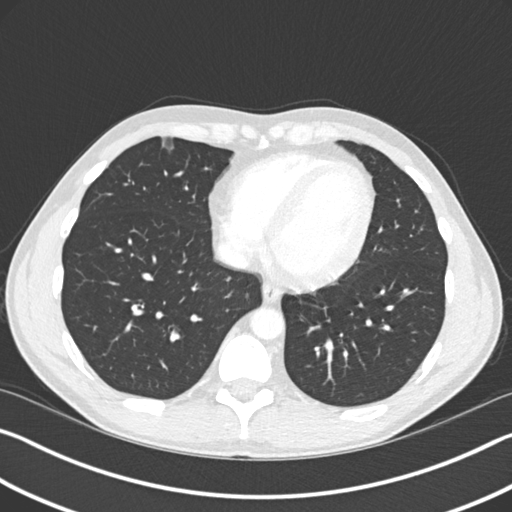
[im 82/185  lung]
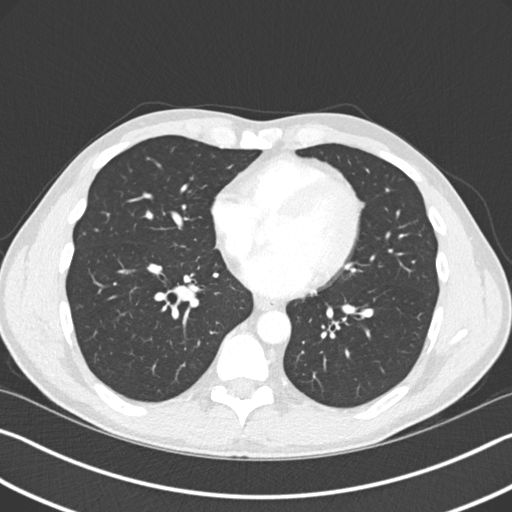
[im 103/185  lung]
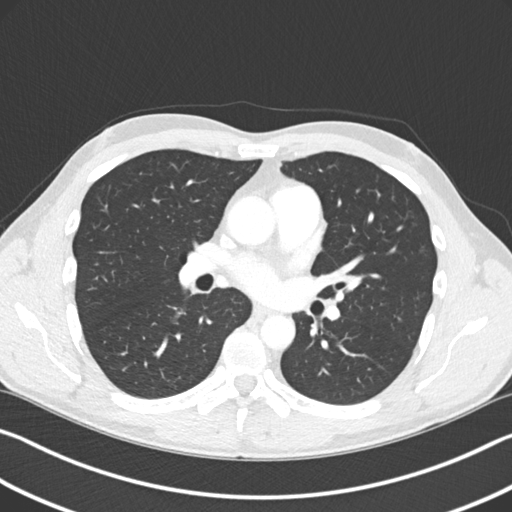
[im 116/185  lung]
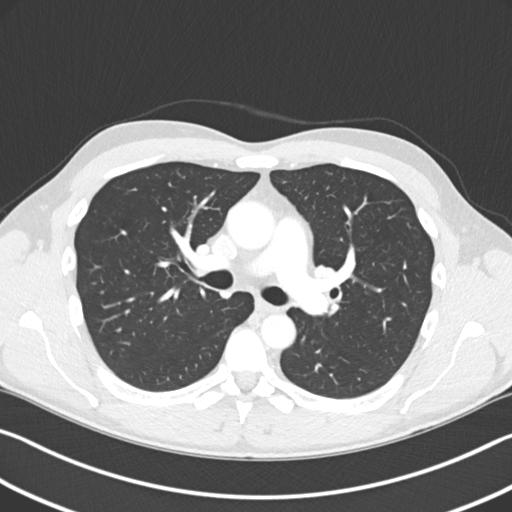
[im 130/185  mediastinal]
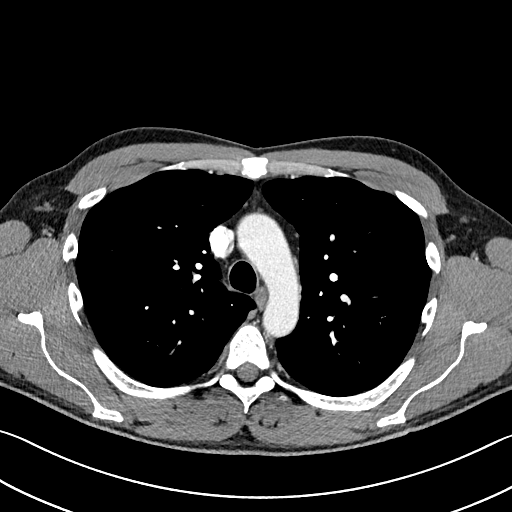
[im 130/185  lung]
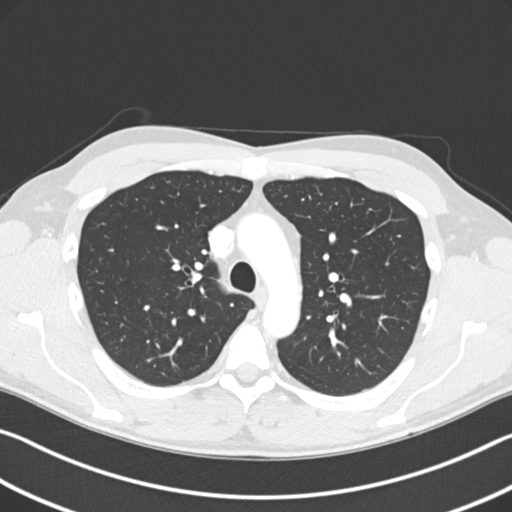
[im 144/185  lung]
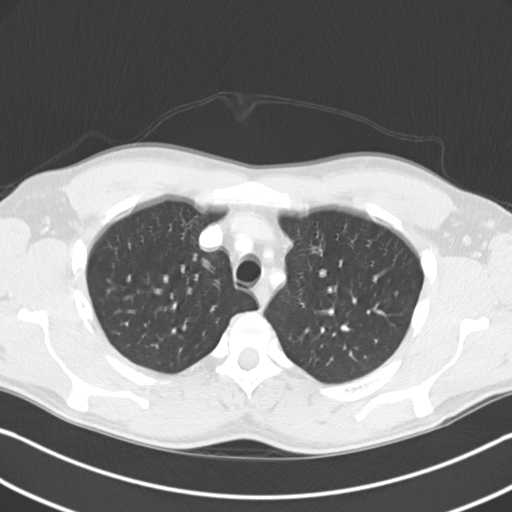
[im 157/185  lung]
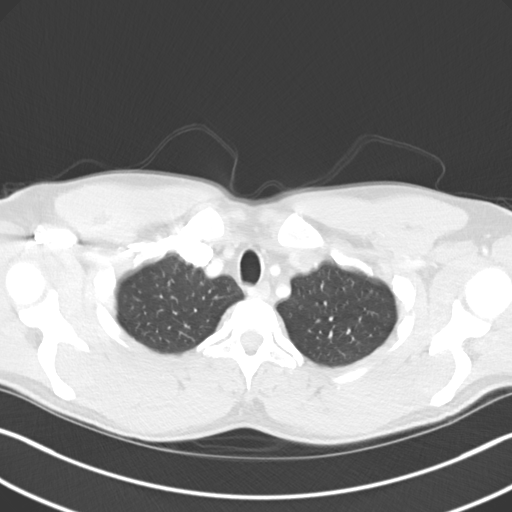
[im 171/185  lung]
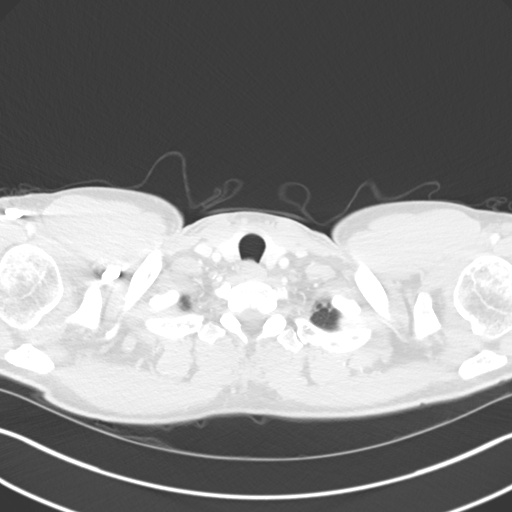

[Series 6: coronal · coronal · 0.71mm/px · 3 of 125 slices shown]
[im 25/125  lung]
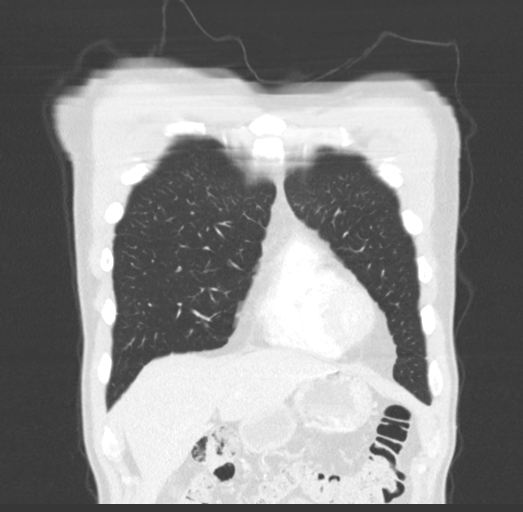
[im 50/125  lung]
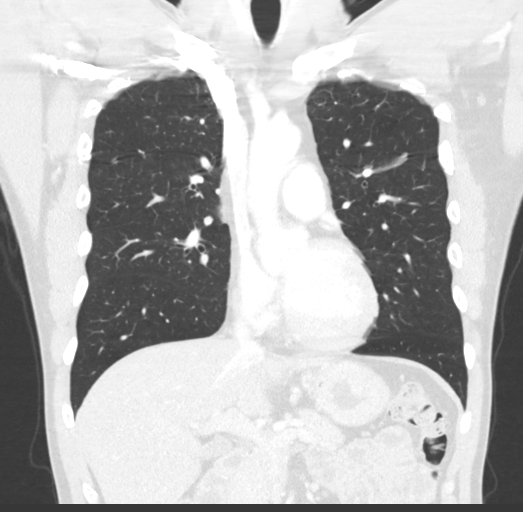
[im 75/125  lung]
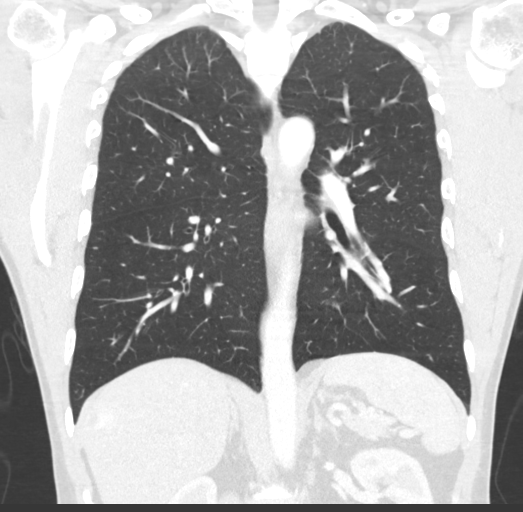

[15 of 36 positions shown; findings below may reference images not displayed]

FINDINGS: Cardiovascular: Heart size is normal. There is no significant
pericardial fluid, thickening or pericardial calcification. No
atherosclerotic calcifications noted in the thoracic aorta or the
coronary arteries.

Mediastinum/Nodes: No pathologically enlarged mediastinal or hilar
lymph nodes. Esophagus is unremarkable in appearance. No axillary
lymphadenopathy.

Lungs/Pleura: No suspicious appearing pulmonary nodules or masses
are noted. No acute consolidative airspace disease. No pleural
effusions.

Upper Abdomen: In the periphery of the right lobe of the liver
predominantly in segment 7 there is a 4.3 x 2.8 cm centrally
low-intermediate attenuation lesion with peripheral nodular
enhancement, incompletely characterize, but favored to represent a
cavernous hemangioma.

Musculoskeletal: There are no aggressive appearing lytic or blastic
lesions noted in the visualized portions of the skeleton.
IMPRESSION: 1. No findings to suggest metastatic disease to the thorax.
2. Incompletely imaged lesion in right lobe of the liver, likely to
represent a cavernous hemangioma. Attention at time of forthcoming
abdominal MRI is recommended to exclude the possibility of an
isolated hepatic metastasis.

## 2020-09-24 IMAGING — DX DG CHEST 1V PORT
1 series · 1 of 1 positions shown · non-contrast
Comparison: CT chest, 02/13/2019

CLINICAL DATA: Port-A-Cath insertion

EXAM:
PORTABLE CHEST 1 VIEW

[chest ap]
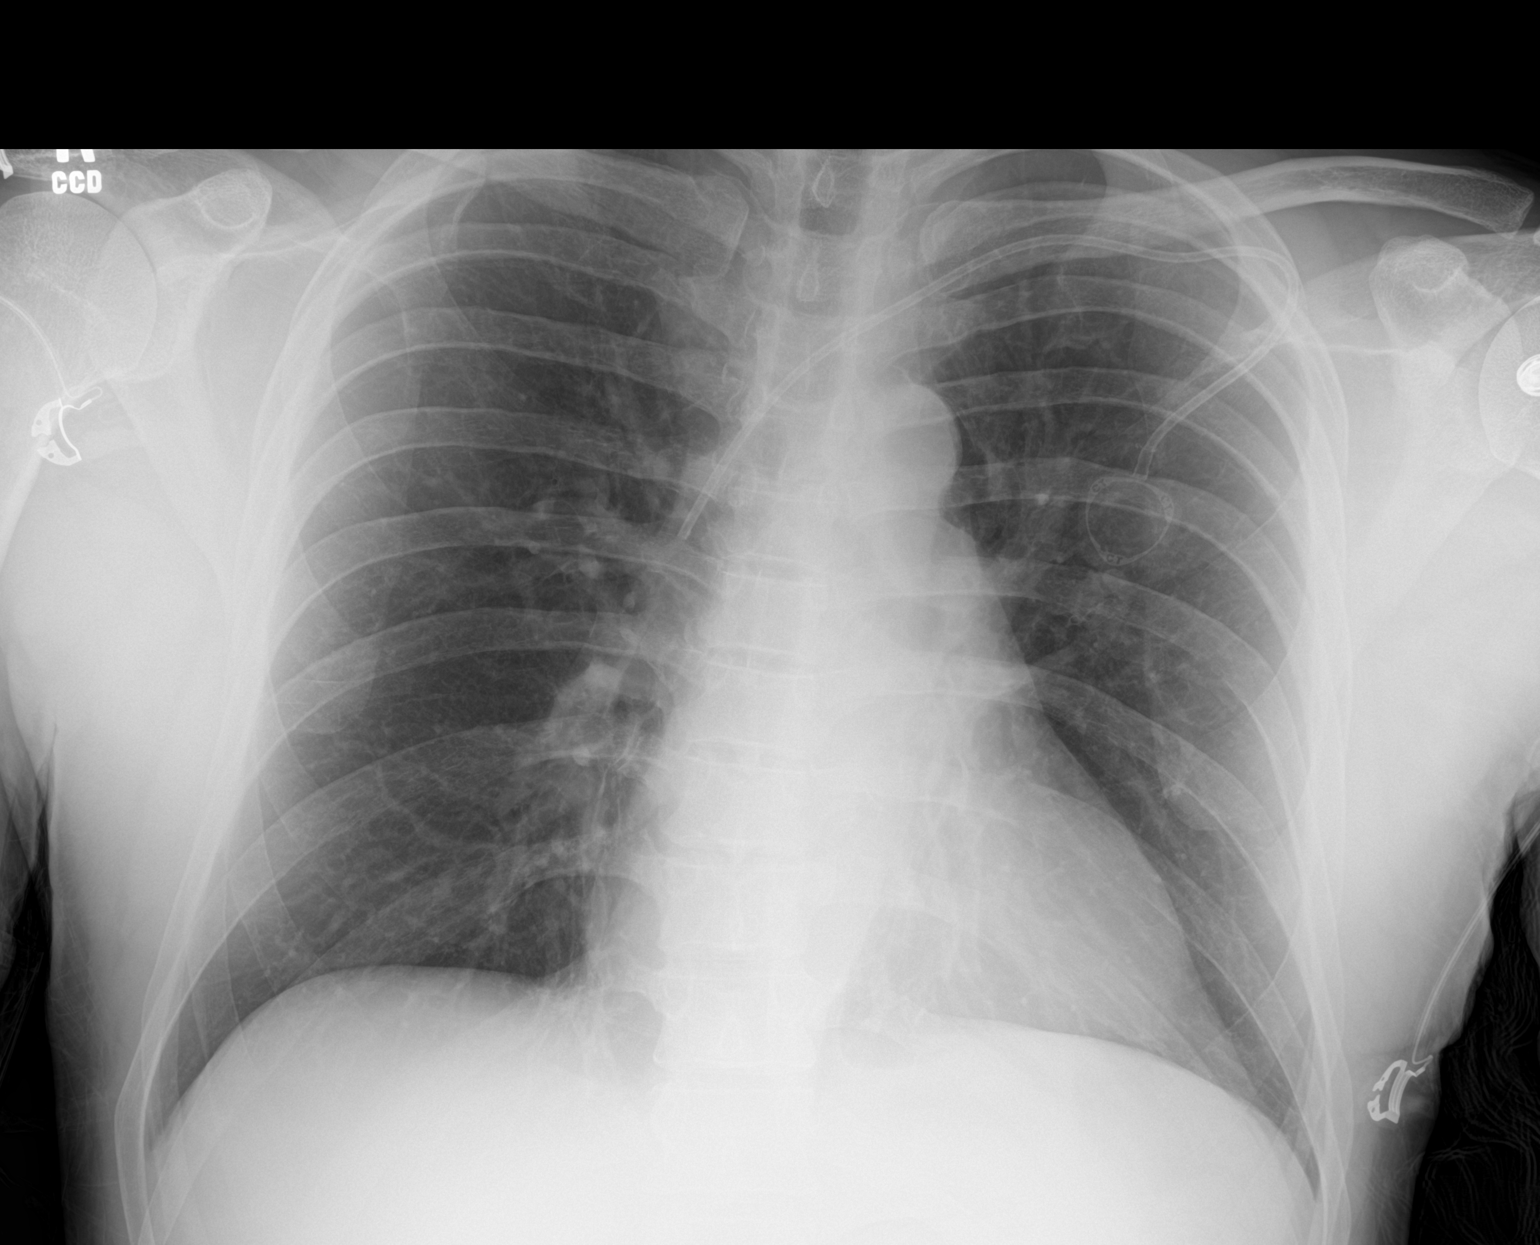

[1 of 1 positions shown; findings below may reference images not displayed]

FINDINGS: The heart size and mediastinal contours are within normal limits.
Left chest port catheter with tip positioned over the mid SVC. Both
lungs are clear. The visualized skeletal structures are
unremarkable.
IMPRESSION: Left chest port catheter with tip position over the mid SVC. No
acute abnormality of the lungs in AP portable projection.

## 2020-09-27 ENCOUNTER — Ambulatory Visit: Payer: Commercial Managed Care - PPO | Admitting: Adult Health

## 2020-09-29 ENCOUNTER — Other Ambulatory Visit: Payer: Self-pay | Admitting: Urology

## 2020-10-10 ENCOUNTER — Encounter: Payer: Self-pay | Admitting: Neurology

## 2020-10-12 ENCOUNTER — Other Ambulatory Visit: Payer: Self-pay

## 2020-10-12 ENCOUNTER — Encounter: Payer: Self-pay | Admitting: Neurology

## 2020-10-12 ENCOUNTER — Ambulatory Visit: Payer: Commercial Managed Care - PPO | Admitting: Neurology

## 2020-10-12 VITALS — BP 137/88 | HR 70 | Ht 72.0 in | Wt 171.0 lb

## 2020-10-12 DIAGNOSIS — Z9989 Dependence on other enabling machines and devices: Secondary | ICD-10-CM | POA: Diagnosis not present

## 2020-10-12 DIAGNOSIS — G4733 Obstructive sleep apnea (adult) (pediatric): Secondary | ICD-10-CM | POA: Diagnosis not present

## 2020-10-12 DIAGNOSIS — R202 Paresthesia of skin: Secondary | ICD-10-CM

## 2020-10-12 DIAGNOSIS — Z79899 Other long term (current) drug therapy: Secondary | ICD-10-CM | POA: Insufficient documentation

## 2020-10-12 NOTE — Progress Notes (Signed)
Subjective:    Patient ID: Brandon Barry is a 45 y.o. male.  HPI     Interim history:   Brandon Barry is a 45 year old right-handed gentleman with an underlying medical history of HTN, and rectal cancer, s/p chemo and radiation, s/p surgery and ileostomy, who presents for follow-up consultation of his obstructive sleep apnea, on CPAP therapy. The patient is unaccompanied today and presents for his yearly check up. I last saw him on 08/06/2018, at which time he reported that CPAP is going well.  He was fully compliant with the treatment.  He did not have any significant restless leg symptoms at the time.  He was seen in the interim by Vaughan Browner, NP on 09/25/2019, at which time he was compliant with treatment.  He had developed some interim paresthesias in his hands and feet.  He had been diagnosed with rectal cancer.  Today, 10/12/2020: I reviewed his CPAP compliance data from 09/11/2020 through 10/10/2020, which is a total of 30 days, during which time he used his machine 28 days with percent use days greater than 4 hours at 27 days, 90%, indicating excellent compliance with an average usage of 6 hours and 31 minutes, residual AHI at goal at 0.5/h, leak quite low, but the 95th percentile at 0.9 L/min on a pressure of 8 cm with EPR of 3.  He reports doing well, sleeping okay, has to empty his ileostomy bag typically once in the middle of the night.  He has been compliant with treatment, sleeping fairly well, using a nasal interface and agrees that he may need to loosen it a little bit as the leak is quite low and sometimes this means that the mask is being tightened too much.  He had rectal cancer surgery and requires additional scar tissue removal, is scheduled to have surgery for this in June.  He had chemotherapy early on and started developing paresthesias in the feet and significant pain eventually.  He was started on gabapentin by his surgeon and it was increased to 2700 milligrams daily.  He took  gabapentin for about 8 or 9 months as per his recollection and some 6+ months ago or 7 months ago he stopped it.  He admits that he stopped it cold [place].  He was encouraged to always taper gabapentin especially at a higher dose like 2700 mg, to avoid repercussions such as rebound headaches, withdrawal seizure, rebound insomnia, rebound anxiety, rebound restless leg symptoms.  Thankfully, he had no actual repercussions after stopping the gabapentin other than some increase in his paresthesias and pain.  Overall, he feels he is doing better and his neuropathy symptoms have greatly improved over time.  When he first started treatment for his cancer, he had some balance issues, none recently, no recent falls.  Works as a Dealer, has a good support system, good workplace situation as well.  He had lost quite a bit of weight in the context of cancer diagnosis and treatments.  He has gained significant weight back and is now maintaining fairly well.  The patient's allergies, current medications, family history, past medical history, past social history, past surgical history and problem list were reviewed and updated as appropriate.    Previously:    :  I saw him on 01/31/2018, at which time we talked about his sleep study results. He was fully compliant with CPAP and reported improvement in his sleep quality, sleep consolidation and daytime tiredness. He had some flareup of restless sleep but no  significant RLS symptoms.   I reviewed his CPAP compliance data from 07/06/2018 through 08/04/2018 which is a total of 30 days, during which time he used his CPAP every night with percent used days greater than 4 hours at 100%, indicating superb compliance with an average usage of 7 hours and 16 minutes, residual AHI at goal at 0.8 per hour, leak on the low side with the 95th percentile at 10 L/m on a pressure of 8 cm with EPR of 3.      I first met him on 08/21/2017 at the request of his primary care physician,  at which time he reported snoring and daytime somnolence and a family history of OSA. His wife had noted pauses in his breathing while he was asleep. He had a baseline sleep study, followed by a CPAP titration study. I went over his test results with him in detail today. Baseline sleep study from 10/08/2017 showed a sleep latency delayed at 108 minutes, REM latency was 62.5 minutes, sleep efficiency reduced. He had an increased percentage of light stage sleep, slow-wave sleep was reduced and REM sleep was also reduced. His total AHI was 11.9 per hour, REM AHI was 5.9 per hour, supine AHI was 35.6 per hour. Average oxygen saturation was 94%, nadir was 85%. He did not have any significant PLMS. Based on his sleep related complaints and test results he was advised to proceed with a CPAP titration study. He had this on 10/18/2017. Sleep latency was 24 minutes, REM latency was 60.5 minutes, sleep efficiency 87.1%. He had 11.8% of slow-wave sleep and a normal percentage of REM sleep. He was fitted with nasal pillows and CPAP was titrated from 5 cm to 9 cm. At a pressure of 8 cm his AHI was 0.5 per hour with supine REM sleep achieved an O2 nadir of 93%. He was noted to have mild PLMS during the study with no significant arousals. Based on his test results I suggested he proceed with CPAP therapy at home at a pressure of 8 cm.   I reviewed his CPAP compliance data from 12/31/2017 through 01/29/2018 which is a total of 30 days during which time he used his CPAP every night with percent used days greater than 4 hours at 100%, indicating superb compliance with an average usage of 7 hours and 0 minutes, residual AHI at goal at 0.8 per hour, leak quite low with the 95th percentile at 1.8 L/m on a pressure of 8 cm with EPR of 3.    08/21/2017: (He) reports snoring and excessive daytime somnolence. I reviewed your office note from 08/13/2013, which you kindly included. His Epworth sleepiness score is 12 out of 24 today,  fatigue score is 40 out of 63. He lives at home with his wife and 2 children. He quit smoking, drinks alcohol occasionally, maybe 2-3 times a week and coffee in the morning. He has had elevated blood pressure values for some time, he has been tracking his blood pressure at home and has had higher numbers he admits. He has not been on hypertension medication. He reports a family history of OSA in his father who uses a CPAP machine. Patient has been snoring for some time but it has become worse with time. His wife has noted apneic pauses in his breathing while he is asleep. He does not wake up rested. He has a history of restless leg syndrome and has had leg movements in the past and was treated symptomatically with Klonopin as he  recalls. He tapered it off some time ago. He remembers having a sleep study some time ago, maybe 10 years ago at which time his sleep apnea was borderline as he recalls. He works as a Psychologist, clinical. He has 2 children ages 45 and 42. Bedtime is fairly structured around 10 or 10:30 and rise time is 5:30. He does not have night to night nocturia but has woken up with a headache on the rare occasion. He quit smoking over 10 years ago and limits his caffeine to one cup of coffee per morning on average, does not typically drink sodas.  His Past Medical History Is Significant For: Past Medical History:  Diagnosis Date  . Chronic back pain greater than 3 months duration   . Hyperlipidemia   . OSA on CPAP   . Peripheral neuropathy    Hands and feet from Chemo  . Rectal adenocarcinoma ([place])   . Right shoulder pain     His Past Surgical History Is Significant For: Past Surgical History:  Procedure Laterality Date  . APPLICATION OF WOUND VAC N/A 11/25/2019   Procedure: APPLICATION OF WOUND VAC AND DRAIN PLACEMENT;  Surgeon: Leighton Ruff, MD;  Location: [facility];  Service: General;  Laterality: N/A;  . BALLOON DILATION N/A 06/03/2020   Procedure: [place];  Surgeon: Carol Ada, MD;  Location: [facility];  Service: Endoscopy;  Laterality: N/A;  . BALLOON DILATION N/A 07/30/2020   Procedure: Larrie Kass DILATION;  Surgeon: Carol Ada, MD;  [facility];  Service: Endoscopy;  Laterality: N/A;  . COLONOSCOPY  02/04/2019  . FLEXIBLE SIGMOIDOSCOPY N/A 04/22/2020   Procedure: FLEXIBLE SIGMOIDOSCOPY;  Surgeon: Leighton Ruff, MD;  [facility];  Service: Endoscopy;  Laterality: N/A;  . FLEXIBLE SIGMOIDOSCOPY N/A 06/03/2020   Procedure: FLEXIBLE SIGMOIDOSCOPY;  Surgeon: Carol Ada, MD;  [facility];  Service: Endoscopy;  Laterality: N/A;  . FLEXIBLE SIGMOIDOSCOPY N/A 07/30/2020   Procedure: FLEXIBLE SIGMOIDOSCOPY;  Surgeon: Carol Ada, MD;  [facility];  Service: Endoscopy;  Laterality: N/A;  . ILEOSTOMY Right 11/25/2019   Procedure: PLACEMENT OF ILEOSTOMY;  Surgeon: Leighton Ruff, MD;  [facility]: [facility];  Service: General;  Laterality: Right;  . IR CATHETER TUBE CHANGE  11/11/2019  . IR PATIENT EVAL TECH 0-60 MINS  12/22/2019  . KNEE ARTHROSCOPY Left 11/19/2018  . LAPAROTOMY N/A 11/25/2019   Procedure: Sela Hilding WITH OPEN PELVIC WASHOUT;  Surgeon: Leighton Ruff, MD;  [facility];  Service: General;  Laterality: N/A;  . PORT-A-CATH REMOVAL N/A 10/15/2019   Procedure: PORT REMOVAL;  Surgeon: Leighton Ruff, MD;  [facility];  Service: General;  Laterality: N/A;  . PORTACATH PLACEMENT Left 02/21/2019   Procedure: INSERTION PORT-A-CATH;  Surgeon: Johnathan Hausen, MD;  [facility];  Service: General;  Laterality: Left;  . WISDOM TOOTH EXTRACTION    . XI ROBOTIC ASSISTED LOWER ANTERIOR RESECTION N/A 10/15/2019   Procedure: XI ROBOTIC ASSISTED LOWER ANTERIOR RESECTION, RIGID PROCTOSCOPY;  Surgeon: Leighton Ruff, MD;  Location[facility];  Service: General;  Laterality: N/A;    His Family History Is Significant For: Family History  Problem Relation Age of Onset  . Heart attack Father   . Cancer Paternal  Grandmother   . Cancer Paternal Grandfather     His Social History Is Significant For: Social History   Socioeconomic History  . Marital status: Married    Spouse name: Not on file  . Number of children: Not on file  . Years of education:  Not on file  . Highest education level: Not on file  Occupational History  . Not on file  Tobacco Use  . Smoking status: Former Smoker    Types: Cigarettes    Quit date: 2005    Years since quitting: 17.3  . Smokeless tobacco: Never Used  . Tobacco comment: quit over 10 years ago  Vaping Use  . Vaping Use: Never used  Substance and Sexual Activity  . Alcohol use: Yes    Comment: 3-4 beers weekly  . Drug use: No  . Sexual activity: Yes  Other Topics Concern  . Not on file  Social History Narrative  . Not on file   Social Determinants of Health   Financial Resource Strain: Not on file  Food Insecurity: Not on file  Transportation Needs: Not on file  Physical Activity: Not on file  Stress: Not on file  Social Connections: Not on file    His Allergies Are:  No Known Allergies:   His Current Medications Are:  Outpatient Encounter Medications as of 10/12/2020  Medication Sig  . loperamide (IMODIUM) 2 MG capsule Take 1 capsule (2 mg total) by mouth 3 (three) times daily before meals.   No facility-administered encounter medications on file as of 10/12/2020.  :  Review of Systems:  Out of a complete 14 point review of systems, all are reviewed and negative with the exception of these symptoms as listed below: Review of Systems  Neurological:       Pt presents today for check up with CPAP. Overall has no concerns and states things are well. DME [employer]    Objective:  Neurological Exam  Physical Exam Physical Examination:   Vitals:   10/12/20 0728  BP: 137/88  Pulse: 70    General Examination: The patient is a very pleasant 45 y.o. male in no acute distress. He appears well-developed and well-nourished and well groomed.    HEENT:Normocephalic, atraumatic, pupils are equal, round and reactive to light, extraocular tracking is well-preserved, face is symmetric with normal facial animation.  Speech is clear without dysarthria, hypophonia or voice tremor, neck with FROM. Oropharynx exam reveals: mildmouth dryness, adequatedental hygiene and moderateairway crowding.Tongue protrudes centrally and palate elevates symmetrically.   Chest:Clear to auscultation without wheezing, rhonchi or crackles noted.  Heart:S1+S2+0, regular and normal without murmurs, rubs or gallops noted.   Abdomen:Soft, non-tender and non-distended.  Ileostomy bag.  Extremities:There isnopitting edema in the distal lower extremities bilaterally.   Skin: Warm and dry without trophic changes noted.   Musculoskeletal: exam reveals no obvious joint deformities, tenderness or joint swelling or erythema.   Neurologically:  Mental status: The patient is awake, alert and oriented in all 4 spheres.Hisimmediate and remote memory, attention, language skills and fund of knowledge are appropriate. There is no evidence of aphasia, agnosia, apraxia or anomia. Speech is clear with normal prosody and enunciation. Thought process is linear. Mood is normaland affect is normal.  Cranial nerves II - XII are as described above under HEENT exam.  Motor exam: Normal bulk, strength and tone is noted. There is tremor.Romberg is negative. Fine motor skills and coordination: grossly intact.  Reflexes are 1+ in the upper extremities and trace in the lower extremities.   Cerebellar testing: No dysmetria or intention tremor. There is no truncal or gait ataxia.  Sensory exam: intact to light touch in the upper and lower extremities.  Gait, station and balance:Hestands easily. No veering to one side is noted. No leaning to one side is  noted. Posture is age-appropriate and stance is narrow based. Gait showsnormalstride length and normalpace. No problems  turning are noted. Tandem walk isunremarkable.  Assessmentand Plan:  In summary,Daden C Blayden Conwell a very pleasant 67 year oldmalewith an underlying medical history of HTN, and rectal cancer, s/p chemo and radiation, s/p surgery and ileostomy, who presents for follow-up consultation of his obstructive sleep apnea, on CPAP therapy.  He presents for his yearly checkup.  He is compliant with treatment.  He is commended for this.  He had developed paresthesias and neuropathy in the context of chemotherapy but symptoms have improved and he is off of gabapentin for the past 6+ months.  I renewed his supplies for CPAP therapy.  He is encouraged to slightly loosen the nasal mask headgear to not tighten too much.  He is advised to follow-up routinely to see Vaughan Browner, NP in 1 year.  We talked about neuropathy symptoms and presentation and symptomatic treatment options as well during this visit.  I answered all his questions today and he was in agreement with the plan. I spent 31 minutes in total face-to-face time and in reviewing records during pre-charting, more than 50% of which was spent in counseling and coordination of care, reviewing test results, reviewing medications and treatment regimen and/or in discussing or reviewing the diagnosis of OSA, paresthesias, the prognosis and treatment options. Pertinent laboratory and imaging test results that were available during this visit with the patient were reviewed by me and considered in my medical decision making (see chart for details).

## 2020-10-12 NOTE — Patient Instructions (Signed)
It was good to see you again today.  I am glad to hear you are doing well and that your neuropathy symptoms have improved over time.  Please continue using your CPAP regularly. While your insurance requires that you use CPAP at least 4 hours each night on 70% of the nights, I recommend, that you not skip any nights and use it throughout the night if you can. Getting used to CPAP and staying with the treatment long term does take time and patience and discipline. Untreated obstructive sleep apnea when it is moderate to severe can have an adverse impact on cardiovascular health and raise her risk for heart disease, arrhythmias, hypertension, congestive heart failure, stroke and diabetes. Untreated obstructive sleep apnea causes sleep disruption, nonrestorative sleep, and sleep deprivation. This can have an impact on your day to day functioning and cause daytime sleepiness and impairment of cognitive function, memory loss, mood disturbance, and problems focussing. Using CPAP regularly can improve these symptoms.  You can follow-up routinely in 1 year to see Vaughan Browner, NP.  Please call us in the interim if you have any questions or concerns.  You can also email through [place].

## 2020-11-03 ENCOUNTER — Other Ambulatory Visit (HOSPITAL_COMMUNITY): Payer: Self-pay

## 2020-11-10 NOTE — Patient Instructions (Addendum)
DUE TO COVID-19 ONLY ONE VISITOR IS ALLOWED TO COME WITH YOU AND STAY IN THE WAITING ROOM ONLY DURING PRE OP AND PROCEDURE DAY OF SURGERY. THE 2 VISITORS  MAY VISIT WITH YOU AFTER SURGERY IN YOUR PRIVATE ROOM DURING VISITING HOURS ONLY!  YOU NEED TO HAVE A COVID 19 TEST ON_6/7______ @__2 :55_____, THIS TEST MUST BE DONE BEFORE SURGERY,  COVID TESTING SITE [place] [id number], IT IS ON THE RIGHT GOING OUT [address] APPROXIMATELY  2 MINUTES PAST [employer] ON THE RIGHT. ONCE YOUR COVID TEST IS COMPLETED,  PLEASE BEGIN THE QUARANTINE INSTRUCTIONS AS OUTLINED IN YOUR HANDOUT.                Brandon Barry    Your procedure is scheduled on: 11/19/20   Report to [facility] Main  Entrance   Report to short stay at 5:15 AM     Call this number if you have problems the morning of surgery [name], [name].     Take these medicines the morning of surgery with A SIP OF WATER: none. Bring your mask and tubing to the hospital  Follow instructions for bowel prep.  Drink plenty of fluids on prep day to prevent dehydration.  DRINK 2 PRESURGERY ENSURE DRINKS THE NIGHT BEFORE SURGERY AT 10:00 PM    NO SOLIDS AFTER MIDNIGHT THE DAY PRIOR TO THE SURGERY.   NOTHING BY MOUTH EXCEPT CLEAR LIQUIDS UNTIL 4:30  Drink last ensure at 4:00. Nothing more by mouth after 4:30 AM               You may not have any metal on your body including               piercings  Do not wear jewelry,  lotions, powders or deodorant              Men may shave face and neck.   Do not bring valuables to the hospital. [place].  Contacts, dentures or bridgework may not be worn into surgery.                Please read over the following fact sheets you were  given: _____________________________________________________________________             [facility] - Preparing for Surgery Before surgery, you can play an important role.  Because skin is not sterile, your skin needs to be as free of germs as possible.  You can reduce the number of germs on your skin by washing with CHG (chlorahexidine gluconate) soap before surgery.  CHG is an antiseptic cleaner which kills germs and bonds with the skin to continue killing germs even after washing. Please DO NOT use if you have an allergy to CHG or antibacterial soaps.  If your skin becomes reddened/irritated stop using the CHG and inform your nurse when you arrive at Short Stay..  You may shave your face/neck. Please follow these instructions carefully:  1.  Shower with CHG Soap the night before surgery and the  morning of Surgery.  2.  If you choose to wash your hair, wash your hair first as usual with your  normal  shampoo.  3.  After you shampoo, rinse your hair and body  thoroughly to remove the  shampoo.                                        4.  Use CHG as you would any other liquid soap.  You can apply chg directly  to the skin and wash                       Gently with a scrungie or clean washcloth.  5.  Apply the CHG Soap to your body ONLY FROM THE NECK DOWN.   Do not use on face/ open                           Wound or open sores. Avoid contact with eyes, ears mouth and genitals (private parts).                       Wash face,  Genitals (private parts) with your normal soap.             6.  Wash thoroughly, paying special attention to the area where your surgery  will be performed.  7.  Thoroughly rinse your body with warm water from the neck down.  8.  DO NOT shower/wash with your normal soap after using and rinsing off  the CHG Soap.                9.  Pat yourself dry with a clean towel.            10.  Wear clean pajamas.            11.  Place clean sheets on your bed the night of your first  shower and do not  sleep with pets. Day of Surgery : Do not apply any lotions/deodorants the morning of surgery.  Please wear clean clothes to the hospital/surgery center.  FAILURE TO FOLLOW THESE INSTRUCTIONS MAY RESULT IN THE CANCELLATION OF YOUR SURGERY PATIENT SIGNATURE_________________________________  NURSE SIGNATURE__________________________________  ________________________________________________________________________

## 2020-11-11 ENCOUNTER — Other Ambulatory Visit: Payer: Self-pay

## 2020-11-11 ENCOUNTER — Encounter (HOSPITAL_COMMUNITY): Payer: Self-pay

## 2020-11-11 ENCOUNTER — Encounter (HOSPITAL_COMMUNITY)
Admission: RE | Admit: 2020-11-11 | Discharge: 2020-11-11 | Disposition: A | Discharge status: Home / Self Care | Payer: Commercial Managed Care - PPO | Source: Ambulatory Visit | Attending: General Surgery | Admitting: General Surgery

## 2020-11-11 DIAGNOSIS — Z01812 Encounter for preprocedural laboratory examination: Secondary | ICD-10-CM | POA: Diagnosis present

## 2020-11-11 LAB — CBC
HCT: 44.2 % (ref 39.0–52.0)
Hemoglobin: 14.7 g/dL (ref 13.0–17.0)
MCH: 31 pg (ref 26.0–34.0)
MCHC: 33.3 g/dL (ref 30.0–36.0)
MCV: 93.2 fL (ref 80.0–100.0)
Platelets: 230 10*3/uL (ref 150–400)
RBC: 4.74 MIL/uL (ref 4.22–5.81)
RDW: 13.5 % (ref 11.5–15.5)
WBC: 5.1 10*3/uL (ref 4.0–10.5)
nRBC: 0 % (ref 0.0–0.2)

## 2020-11-11 LAB — BASIC METABOLIC PANEL
Anion gap: 6 (ref 5–15)
BUN: 15 mg/dL (ref 6–20)
CO2: 26 mmol/L (ref 22–32)
Calcium: 9.2 mg/dL (ref 8.9–10.3)
Chloride: 106 mmol/L (ref 98–111)
Creatinine, Ser: 1.03 mg/dL (ref 0.61–1.24)
GFR, Estimated: >60 mL/min (ref >60)
Glucose, Bld: 106 mg/dL — ABNORMAL HIGH (ref 70–99)
Potassium: 4 mmol/L (ref 3.5–5.1)
Sodium: 138 mmol/L (ref 135–145)

## 2020-11-11 NOTE — Progress Notes (Signed)
COVID Vaccine Completed:No Date COVID Vaccine completed: COVID vaccine manufacturer: [place]   PCP - [facility] Cardiologist - none  Chest x-ray - 08/19/20-epic Stress Test - no ECHO - no Cardiac Cath -no  Pacemaker/ICD device last checked:NA  Sleep Study - yes CPAP - yes  Fasting Blood Sugar - NA Checks Blood Sugar _____ times a day  Blood Thinner Instructions:NA Aspirin Instructions: Last Dose:  Anesthesia review:   Patient denies shortness of breath, fever, cough and chest pain at PAT appointment  Yes. No SOB with any activities Patient verbalized understanding of instructions that were given to them at the PAT appointment. Patient was also instructed that they will need to review over the PAT instructions again at home before surgery.Yes

## 2020-11-16 ENCOUNTER — Other Ambulatory Visit (HOSPITAL_COMMUNITY)
Admission: RE | Admit: 2020-11-16 | Discharge: 2020-11-16 | Disposition: A | Discharge status: Home / Self Care | Payer: Commercial Managed Care - PPO | Source: Ambulatory Visit | Attending: General Surgery | Admitting: General Surgery

## 2020-11-16 DIAGNOSIS — Z01812 Encounter for preprocedural laboratory examination: Secondary | ICD-10-CM | POA: Insufficient documentation

## 2020-11-16 DIAGNOSIS — Z20822 Contact with and (suspected) exposure to covid-19: Secondary | ICD-10-CM | POA: Insufficient documentation

## 2020-11-16 LAB — SARS CORONAVIRUS 2 (TAT 6-24 HRS): SARS Coronavirus 2: NEGATIVE

## 2020-11-18 MED ORDER — BUPIVACAINE LIPOSOME 1.3 % IJ SUSP
20.0000 mL | Freq: Once | INTRAMUSCULAR | Status: DC
  Filled 2020-11-18: qty 20

## 2020-11-18 NOTE — Anesthesia Preprocedure Evaluation (Addendum)
Anesthesia Evaluation  Patient identified by MRN, date of birth, ID band Patient awake    Reviewed: Allergy & Precautions, NPO status , Patient's Chart, lab work & pertinent test results  History of Anesthesia Complications Negative for: history of anesthetic complications  Airway Mallampati: II  TM Distance: >3 FB Neck ROM: Full    Dental no notable dental hx.    Pulmonary sleep apnea and Continuous Positive Airway Pressure Ventilation , former smoker,    Pulmonary exam normal        Cardiovascular negative cardio ROS Normal cardiovascular exam     Neuro/Psych Anxiety Depression negative neurological ROS     GI/Hepatic Neg liver ROS, Rectal ca   Endo/Other  negative endocrine ROS  Renal/GU negative Renal ROS  negative genitourinary   Musculoskeletal negative musculoskeletal ROS (+)   Abdominal   Peds  Hematology negative hematology ROS (+)   Anesthesia Other Findings Day of surgery medications reviewed with patient.  Reproductive/Obstetrics negative OB ROS                            Anesthesia Physical Anesthesia Plan  ASA: 3  Anesthesia Plan: General   Post-op Pain Management:    Induction: Intravenous  PONV Risk Score and Plan: 4 or greater and Treatment may vary due to age or medical condition, Ondansetron, Dexamethasone and Midazolam  Airway Management Planned: Oral ETT  Additional Equipment: Arterial line  Intra-op Plan:   Post-operative Plan: Extubation in OR  Informed Consent: I have reviewed the patients History and Physical, chart, labs and discussed the procedure including the risks, benefits and alternatives for the proposed anesthesia with the patient or authorized representative who has indicated his/her understanding and acceptance.     Dental advisory given  Plan Discussed with: CRNA  Anesthesia Plan Comments:       Anesthesia Quick Evaluation

## 2020-11-19 ENCOUNTER — Inpatient Hospital Stay (HOSPITAL_COMMUNITY): Payer: Commercial Managed Care - PPO | Admitting: Physician Assistant

## 2020-11-19 ENCOUNTER — Inpatient Hospital Stay (HOSPITAL_COMMUNITY)
Admission: RE | Admit: 2020-11-19 | Discharge: 2020-11-22 | DRG: 330 | Disposition: A | Discharge status: Home / Self Care | Payer: Commercial Managed Care - PPO | Attending: General Surgery | Admitting: General Surgery

## 2020-11-19 ENCOUNTER — Encounter (HOSPITAL_COMMUNITY)
Admission: RE | Disposition: A | Discharge status: Home / Self Care | Payer: Self-pay | Source: Home / Self Care | Attending: General Surgery

## 2020-11-19 ENCOUNTER — Encounter (HOSPITAL_COMMUNITY): Payer: Self-pay | Admitting: General Surgery

## 2020-11-19 ENCOUNTER — Inpatient Hospital Stay (HOSPITAL_COMMUNITY): Payer: Commercial Managed Care - PPO | Admitting: Certified Registered Nurse Anesthetist

## 2020-11-19 ENCOUNTER — Other Ambulatory Visit: Payer: Self-pay

## 2020-11-19 DIAGNOSIS — Z85038 Personal history of other malignant neoplasm of large intestine: Secondary | ICD-10-CM

## 2020-11-19 DIAGNOSIS — K9189 Other postprocedural complications and disorders of digestive system: Secondary | ICD-10-CM | POA: Diagnosis present

## 2020-11-19 DIAGNOSIS — Z8249 Family history of ischemic heart disease and other diseases of the circulatory system: Secondary | ICD-10-CM

## 2020-11-19 DIAGNOSIS — Z20822 Contact with and (suspected) exposure to covid-19: Secondary | ICD-10-CM | POA: Diagnosis present

## 2020-11-19 DIAGNOSIS — K913 Postprocedural intestinal obstruction, unspecified as to partial versus complete: Secondary | ICD-10-CM | POA: Diagnosis present

## 2020-11-19 DIAGNOSIS — Y832 Surgical operation with anastomosis, bypass or graft as the cause of abnormal reaction of the patient, or of later complication, without mention of misadventure at the time of the procedure: Secondary | ICD-10-CM | POA: Diagnosis present

## 2020-11-19 DIAGNOSIS — K565 Intestinal adhesions [bands], unspecified as to partial versus complete obstruction: Secondary | ICD-10-CM | POA: Diagnosis present

## 2020-11-19 DIAGNOSIS — Z8371 Family history of colonic polyps: Secondary | ICD-10-CM | POA: Diagnosis not present

## 2020-11-19 HISTORY — PX: XI ROBOTIC ASSISTED LOWER ANTERIOR RESECTION: SHX6558

## 2020-11-19 SURGERY — RESECTION, RECTUM, LOW ANTERIOR, ROBOT-ASSISTED
Anesthesia: General | Site: Abdomen

## 2020-11-19 MED ORDER — PROPOFOL 10 MG/ML IV BOLUS
INTRAVENOUS | Status: AC
  Filled 2020-11-19: qty 20

## 2020-11-19 MED ORDER — ONDANSETRON HCL 4 MG PO TABS: 4.0000 mg | ORAL_TABLET | Freq: Four times a day (QID) | ORAL | Status: DC | PRN

## 2020-11-19 MED ORDER — DEXAMETHASONE SODIUM PHOSPHATE 4 MG/ML IJ SOLN
INTRAMUSCULAR | Status: DC | PRN
  Administered 2020-11-19: 5 mg via INTRAVENOUS

## 2020-11-19 MED ORDER — LIDOCAINE 2% (20 MG/ML) 5 ML SYRINGE
INTRAMUSCULAR | Status: AC
  Filled 2020-11-19: qty 5

## 2020-11-19 MED ORDER — LACTATED RINGERS IV SOLN: INTRAVENOUS | Status: DC | PRN

## 2020-11-19 MED ORDER — FENTANYL CITRATE (PF) 250 MCG/5ML IJ SOLN
INTRAMUSCULAR | Status: AC
  Filled 2020-11-19: qty 5

## 2020-11-19 MED ORDER — BUPIVACAINE-EPINEPHRINE 0.25% -1:200000 IJ SOLN
INTRAMUSCULAR | Status: AC
  Filled 2020-11-19: qty 1

## 2020-11-19 MED ORDER — ACETAMINOPHEN 500 MG PO TABS: 1000.0000 mg | ORAL_TABLET | Freq: Once | ORAL | Status: DC

## 2020-11-19 MED ORDER — HEPARIN SODIUM (PORCINE) 5000 UNIT/ML IJ SOLN
5000.0000 [IU] | Freq: Once | INTRAMUSCULAR | Status: AC
  Administered 2020-11-19: 5000 [IU] via SUBCUTANEOUS
  Filled 2020-11-19: qty 1

## 2020-11-19 MED ORDER — LACTATED RINGERS IR SOLN
Status: DC | PRN
  Administered 2020-11-19: 1000 mL

## 2020-11-19 MED ORDER — ACETAMINOPHEN 500 MG PO TABS
1000.0000 mg | ORAL_TABLET | Freq: Four times a day (QID) | ORAL | Status: DC
  Administered 2020-11-19 – 2020-11-22 (×11): 1000 mg via ORAL
  Filled 2020-11-19 (×12): qty 2

## 2020-11-19 MED ORDER — PROMETHAZINE HCL 25 MG/ML IJ SOLN: 6.2500 mg | INTRAMUSCULAR | Status: DC | PRN

## 2020-11-19 MED ORDER — ACETAMINOPHEN 10 MG/ML IV SOLN: 1000.0000 mg | Freq: Once | INTRAVENOUS | Status: DC | PRN

## 2020-11-19 MED ORDER — HYDROMORPHONE HCL 1 MG/ML IJ SOLN
0.2500 mg | INTRAMUSCULAR | Status: DC | PRN
  Administered 2020-11-19 (×4): 0.5 mg via INTRAVENOUS

## 2020-11-19 MED ORDER — ALVIMOPAN 12 MG PO CAPS
12.0000 mg | ORAL_CAPSULE | ORAL | Status: AC
  Administered 2020-11-19: 12 mg via ORAL
  Filled 2020-11-19: qty 1

## 2020-11-19 MED ORDER — KETAMINE HCL 10 MG/ML IJ SOLN
INTRAMUSCULAR | Status: AC
  Filled 2020-11-19: qty 1

## 2020-11-19 MED ORDER — GABAPENTIN 300 MG PO CAPS
300.0000 mg | ORAL_CAPSULE | Freq: Two times a day (BID) | ORAL | Status: DC
  Administered 2020-11-19 – 2020-11-22 (×6): 300 mg via ORAL
  Filled 2020-11-19 (×6): qty 1

## 2020-11-19 MED ORDER — ROCURONIUM BROMIDE 10 MG/ML (PF) SYRINGE
PREFILLED_SYRINGE | INTRAVENOUS | Status: AC
  Filled 2020-11-19: qty 10

## 2020-11-19 MED ORDER — MIDAZOLAM HCL 2 MG/2ML IJ SOLN
INTRAMUSCULAR | Status: AC
  Filled 2020-11-19: qty 2

## 2020-11-19 MED ORDER — LIDOCAINE HCL 2 % IJ SOLN
INTRAMUSCULAR | Status: AC
  Filled 2020-11-19: qty 20

## 2020-11-19 MED ORDER — PROPOFOL 10 MG/ML IV BOLUS
INTRAVENOUS | Status: DC | PRN
  Administered 2020-11-19: 160 mg via INTRAVENOUS

## 2020-11-19 MED ORDER — ORAL CARE MOUTH RINSE: 15.0000 mL | Freq: Once | OROMUCOSAL | Status: AC

## 2020-11-19 MED ORDER — CHLORHEXIDINE GLUCONATE 0.12 % MT SOLN
15.0000 mL | Freq: Once | OROMUCOSAL | Status: AC
  Administered 2020-11-19: 15 mL via OROMUCOSAL

## 2020-11-19 MED ORDER — HYDROMORPHONE HCL 1 MG/ML IJ SOLN
0.5000 mg | Freq: Once | INTRAMUSCULAR | Status: AC
  Administered 2020-11-19: 0.5 mg via INTRAVENOUS

## 2020-11-19 MED ORDER — ENSURE PRE-SURGERY PO LIQD
592.0000 mL | Freq: Once | ORAL | Status: DC
  Filled 2020-11-19: qty 592

## 2020-11-19 MED ORDER — SODIUM CHLORIDE 0.9 % IV SOLN
2.0000 g | INTRAVENOUS | Status: AC
  Administered 2020-11-19: 2 g via INTRAVENOUS
  Filled 2020-11-19: qty 2

## 2020-11-19 MED ORDER — SACCHAROMYCES BOULARDII 250 MG PO CAPS
250.0000 mg | ORAL_CAPSULE | Freq: Two times a day (BID) | ORAL | Status: DC
  Administered 2020-11-19 – 2020-11-22 (×6): 250 mg via ORAL
  Filled 2020-11-19 (×5): qty 1

## 2020-11-19 MED ORDER — 0.9 % SODIUM CHLORIDE (POUR BTL) OPTIME
TOPICAL | Status: DC | PRN
  Administered 2020-11-19: 2000 mL
  Administered 2020-11-19: 1000 mL

## 2020-11-19 MED ORDER — ENSURE SURGERY PO LIQD
237.0000 mL | Freq: Two times a day (BID) | ORAL | Status: DC
  Administered 2020-11-20 – 2020-11-22 (×4): 237 mL via ORAL

## 2020-11-19 MED ORDER — ONDANSETRON HCL 4 MG/2ML IJ SOLN: 4.0000 mg | Freq: Four times a day (QID) | INTRAMUSCULAR | Status: DC | PRN

## 2020-11-19 MED ORDER — STERILE WATER FOR INJECTION IJ SOLN
INTRAMUSCULAR | Status: DC | PRN
  Administered 2020-11-19: 15 mL via INTRAMUSCULAR

## 2020-11-19 MED ORDER — BUPIVACAINE-EPINEPHRINE 0.25% -1:200000 IJ SOLN
INTRAMUSCULAR | Status: DC | PRN
  Administered 2020-11-19: 30 mL

## 2020-11-19 MED ORDER — FENTANYL CITRATE (PF) 100 MCG/2ML IJ SOLN
INTRAMUSCULAR | Status: DC | PRN
  Administered 2020-11-19 (×8): 50 ug via INTRAVENOUS
  Administered 2020-11-19: 100 ug via INTRAVENOUS

## 2020-11-19 MED ORDER — ACETAMINOPHEN 500 MG PO TABS
1000.0000 mg | ORAL_TABLET | ORAL | Status: AC
  Administered 2020-11-19: 1000 mg via ORAL
  Filled 2020-11-19: qty 2

## 2020-11-19 MED ORDER — ALVIMOPAN 12 MG PO CAPS: 12.0000 mg | ORAL_CAPSULE | Freq: Two times a day (BID) | ORAL | Status: DC

## 2020-11-19 MED ORDER — SPY AGENT GREEN - (INDOCYANINE FOR INJECTION)
INTRAMUSCULAR | Status: DC | PRN
  Administered 2020-11-19: 5 mL via INTRAVENOUS
  Administered 2020-11-19: 3 mL via INTRAVENOUS
  Administered 2020-11-19: 2.5 mL via INTRAVENOUS

## 2020-11-19 MED ORDER — LIDOCAINE HCL (PF) 2 % IJ SOLN
INTRAMUSCULAR | Status: DC | PRN
  Administered 2020-11-19: 1.5 mg/kg/h via INTRADERMAL

## 2020-11-19 MED ORDER — BUPIVACAINE LIPOSOME 1.3 % IJ SUSP
INTRAMUSCULAR | Status: DC | PRN
  Administered 2020-11-19: 20 mL

## 2020-11-19 MED ORDER — ONDANSETRON HCL 4 MG/2ML IJ SOLN
INTRAMUSCULAR | Status: AC
  Filled 2020-11-19: qty 2

## 2020-11-19 MED ORDER — ALUM & MAG HYDROXIDE-SIMETH 200-200-20 MG/5ML PO SUSP: 30.0000 mL | Freq: Four times a day (QID) | ORAL | Status: DC | PRN

## 2020-11-19 MED ORDER — DIPHENHYDRAMINE HCL 50 MG/ML IJ SOLN: 25.0000 mg | Freq: Four times a day (QID) | INTRAMUSCULAR | Status: DC | PRN

## 2020-11-19 MED ORDER — MIDAZOLAM HCL 5 MG/5ML IJ SOLN
INTRAMUSCULAR | Status: DC | PRN
  Administered 2020-11-19: 2 mg via INTRAVENOUS

## 2020-11-19 MED ORDER — ENSURE PRE-SURGERY PO LIQD
296.0000 mL | Freq: Once | ORAL | Status: DC
  Filled 2020-11-19: qty 296

## 2020-11-19 MED ORDER — HYDROMORPHONE HCL 1 MG/ML IJ SOLN
INTRAMUSCULAR | Status: AC
  Filled 2020-11-19: qty 1

## 2020-11-19 MED ORDER — LACTATED RINGERS IV SOLN: INTRAVENOUS | Status: DC

## 2020-11-19 MED ORDER — STERILE WATER FOR INJECTION IJ SOLN
INTRAMUSCULAR | Status: AC
  Filled 2020-11-19: qty 10

## 2020-11-19 MED ORDER — LOPERAMIDE HCL 2 MG PO CAPS
2.0000 mg | ORAL_CAPSULE | Freq: Every day | ORAL | Status: DC
  Administered 2020-11-20 – 2020-11-21 (×2): 2 mg via ORAL
  Filled 2020-11-19 (×2): qty 1

## 2020-11-19 MED ORDER — SUGAMMADEX SODIUM 200 MG/2ML IV SOLN
INTRAVENOUS | Status: DC | PRN
  Administered 2020-11-19: 200 mg via INTRAVENOUS

## 2020-11-19 MED ORDER — SODIUM CHLORIDE 0.9 % IV SOLN
2.0000 g | Freq: Two times a day (BID) | INTRAVENOUS | Status: AC
  Administered 2020-11-19: 2 g via INTRAVENOUS
  Filled 2020-11-19: qty 2

## 2020-11-19 MED ORDER — KCL IN DEXTROSE-NACL 20-5-0.45 MEQ/L-%-% IV SOLN
INTRAVENOUS | Status: DC
  Filled 2020-11-19: qty 1000

## 2020-11-19 MED ORDER — KETAMINE HCL 10 MG/ML IJ SOLN
INTRAMUSCULAR | Status: DC | PRN
  Administered 2020-11-19 (×2): 10 mg via INTRAVENOUS
  Administered 2020-11-19: 20 mg via INTRAVENOUS
  Administered 2020-11-19: 35 mg via INTRAVENOUS
  Administered 2020-11-19: 10 mg via INTRAVENOUS

## 2020-11-19 MED ORDER — LIDOCAINE 2% (20 MG/ML) 5 ML SYRINGE
INTRAMUSCULAR | Status: DC | PRN
  Administered 2020-11-19: 100 mg via INTRAVENOUS

## 2020-11-19 MED ORDER — ENOXAPARIN SODIUM 40 MG/0.4ML IJ SOSY
40.0000 mg | PREFILLED_SYRINGE | INTRAMUSCULAR | Status: DC
  Administered 2020-11-20 – 2020-11-21 (×2): 40 mg via SUBCUTANEOUS
  Filled 2020-11-19 (×3): qty 0.4

## 2020-11-19 MED ORDER — DIPHENHYDRAMINE HCL 25 MG PO CAPS
25.0000 mg | ORAL_CAPSULE | Freq: Four times a day (QID) | ORAL | Status: DC | PRN
Start: 2020-11-19 — End: 2020-11-22

## 2020-11-19 MED ORDER — HYDROMORPHONE HCL 1 MG/ML IJ SOLN
0.5000 mg | INTRAMUSCULAR | Status: DC | PRN
Start: 2020-11-19 — End: 2020-11-20
  Administered 2020-11-19 – 2020-11-20 (×4): 0.5 mg via INTRAVENOUS
  Filled 2020-11-19 (×4): qty 0.5

## 2020-11-19 MED ORDER — DEXAMETHASONE SODIUM PHOSPHATE 10 MG/ML IJ SOLN
INTRAMUSCULAR | Status: AC
  Filled 2020-11-19: qty 1

## 2020-11-19 MED ORDER — ROCURONIUM BROMIDE 10 MG/ML (PF) SYRINGE
PREFILLED_SYRINGE | INTRAVENOUS | Status: DC | PRN
  Administered 2020-11-19: 20 mg via INTRAVENOUS
  Administered 2020-11-19: 60 mg via INTRAVENOUS
  Administered 2020-11-19: 30 mg via INTRAVENOUS
  Administered 2020-11-19: 10 mg via INTRAVENOUS
  Administered 2020-11-19 (×2): 20 mg via INTRAVENOUS

## 2020-11-19 MED ORDER — PROPOFOL 500 MG/50ML IV EMUL
INTRAVENOUS | Status: DC | PRN
  Administered 2020-11-19: 25 ug/kg/min via INTRAVENOUS

## 2020-11-19 MED ORDER — PROPOFOL 500 MG/50ML IV EMUL
INTRAVENOUS | Status: AC
  Filled 2020-11-19: qty 50

## 2020-11-19 SURGICAL SUPPLY — 98 items
BAG SPEC RTRVL 10 TROC 200 (ENDOMECHANICALS) ×2
BAG URO CATCHER STRL LF (MISCELLANEOUS) ×3 IMPLANT
BLADE EXTENDED COATED 6.5IN (ELECTRODE) IMPLANT
CANNULA REDUC XI 12-8 STAPL (CANNULA)
CANNULA REDUCER 12-8 DVNC XI (CANNULA) IMPLANT
CATH URET 5FR 28IN OPEN ENDED (CATHETERS) IMPLANT
CELLS DAT CNTRL 66122 CELL SVR (MISCELLANEOUS) IMPLANT
CLOTH BEACON ORANGE TIMEOUT ST (SAFETY) ×3 IMPLANT
COVER SURGICAL LIGHT HANDLE (MISCELLANEOUS) ×6 IMPLANT
COVER TIP SHEARS 8 DVNC (MISCELLANEOUS) ×2 IMPLANT
COVER TIP SHEARS 8MM DA VINCI (MISCELLANEOUS) ×3
COVER WAND RF STERILE (DRAPES) ×3 IMPLANT
DECANTER SPIKE VIAL GLASS SM (MISCELLANEOUS) IMPLANT
DRAIN CHANNEL 19F RND (DRAIN) IMPLANT
DRAPE ARM DVNC X/XI (DISPOSABLE) ×8 IMPLANT
DRAPE COLUMN DVNC XI (DISPOSABLE) ×2 IMPLANT
DRAPE DA VINCI XI ARM (DISPOSABLE) ×12
DRAPE DA VINCI XI COLUMN (DISPOSABLE) ×3
DRAPE SURG IRRIG POUCH 19X23 (DRAPES) ×3 IMPLANT
DRSG OPSITE POSTOP 4X10 (GAUZE/BANDAGES/DRESSINGS) IMPLANT
DRSG OPSITE POSTOP 4X6 (GAUZE/BANDAGES/DRESSINGS) IMPLANT
DRSG OPSITE POSTOP 4X8 (GAUZE/BANDAGES/DRESSINGS) IMPLANT
ELECT PENCIL ROCKER SW 15FT (MISCELLANEOUS) ×3 IMPLANT
ELECT REM PT RETURN 15FT ADLT (MISCELLANEOUS) ×3 IMPLANT
ENDOLOOP SUT PDS II  0 18 (SUTURE)
ENDOLOOP SUT PDS II 0 18 (SUTURE) IMPLANT
EVACUATOR SILICONE 100CC (DRAIN) IMPLANT
GLOVE SURG ENC MOIS LTX SZ6.5 (GLOVE) ×9 IMPLANT
GLOVE SURG POLYISO LF SZ8 (GLOVE) IMPLANT
GLOVE SURG UNDER POLY LF SZ7 (GLOVE) ×6 IMPLANT
GOWN STRL REUS W/TWL XL LVL3 (GOWN DISPOSABLE) ×12 IMPLANT
GRASPER SUT TROCAR 14GX15 (MISCELLANEOUS) IMPLANT
GUIDEWIRE STR DUAL SENSOR (WIRE) ×3 IMPLANT
HOLDER FOLEY CATH W/STRAP (MISCELLANEOUS) ×3 IMPLANT
IRRIG SUCT STRYKERFLOW 2 WTIP (MISCELLANEOUS) ×3
IRRIGATION SUCT STRKRFLW 2 WTP (MISCELLANEOUS) ×2 IMPLANT
IRRIGATOR SUCT 8 DISP DVNC XI (IRRIGATION / IRRIGATOR) ×2 IMPLANT
IRRIGATOR SUCTION 8MM XI DISP (IRRIGATION / IRRIGATOR) ×3
KIT PROCEDURE DA VINCI SI (MISCELLANEOUS)
KIT PROCEDURE DVNC SI (MISCELLANEOUS) IMPLANT
KIT TURNOVER KIT A (KITS) ×6 IMPLANT
MANIFOLD NEPTUNE II (INSTRUMENTS) ×3 IMPLANT
NEEDLE INSUFFLATION 14GA 120MM (NEEDLE) ×3 IMPLANT
PACK CARDIOVASCULAR III (CUSTOM PROCEDURE TRAY) ×3 IMPLANT
PACK COLON (CUSTOM PROCEDURE TRAY) ×3 IMPLANT
PACK CYSTO (CUSTOM PROCEDURE TRAY) ×3 IMPLANT
PAD POSITIONING PINK XL (MISCELLANEOUS) ×3 IMPLANT
PORT LAP GEL ALEXIS MED 5-9CM (MISCELLANEOUS) IMPLANT
POUCH RETRIEVAL ECOSAC 10 (ENDOMECHANICALS) ×2 IMPLANT
POUCH RETRIEVAL ECOSAC 10MM (ENDOMECHANICALS) ×3
RELOAD STAPLER 3.5X60 BLU DVNC (STAPLE) IMPLANT
RELOAD STAPLER 4.3X60 GRN DVNC (STAPLE) IMPLANT
RTRCTR WOUND ALEXIS 18CM MED (MISCELLANEOUS)
SCISSORS LAP 5X35 DISP (ENDOMECHANICALS) IMPLANT
SEAL CANN UNIV 5-8 DVNC XI (MISCELLANEOUS) ×6 IMPLANT
SEAL XI 5MM-8MM UNIVERSAL (MISCELLANEOUS) ×9
SEALER VESSEL DA VINCI XI (MISCELLANEOUS) ×3
SEALER VESSEL EXT DVNC XI (MISCELLANEOUS) ×2 IMPLANT
SLEEVE ADV FIXATION 5X100MM (TROCAR) ×3 IMPLANT
SOLUTION ELECTROLUBE (MISCELLANEOUS) ×3 IMPLANT
STAPLER 60 DA VINCI SURE FORM (STAPLE)
STAPLER 60 SUREFORM DVNC (STAPLE) IMPLANT
STAPLER CANNULA SEAL DVNC XI (STAPLE) IMPLANT
STAPLER CANNULA SEAL XI (STAPLE)
STAPLER ECHELON POWER CIR 29 (STAPLE) IMPLANT
STAPLER ECHELON POWER CIR 31 (STAPLE) IMPLANT
STAPLER RELOAD 3.5X60 BLU DVNC (STAPLE)
STAPLER RELOAD 3.5X60 BLUE (STAPLE)
STAPLER RELOAD 4.3X60 GREEN (STAPLE)
STAPLER RELOAD 4.3X60 GRN DVNC (STAPLE)
STOPCOCK 4 WAY LG BORE MALE ST (IV SETS) ×6 IMPLANT
SUT ETHILON 2 0 PS N (SUTURE) IMPLANT
SUT NOVA NAB DX-16 0-1 5-0 T12 (SUTURE) ×6 IMPLANT
SUT PROLENE 2 0 KS (SUTURE) IMPLANT
SUT SILK 2 0 (SUTURE) ×3
SUT SILK 2 0 SH CR/8 (SUTURE) IMPLANT
SUT SILK 2-0 18XBRD TIE 12 (SUTURE) ×2 IMPLANT
SUT SILK 3 0 (SUTURE)
SUT SILK 3 0 SH CR/8 (SUTURE) ×3 IMPLANT
SUT SILK 3-0 18XBRD TIE 12 (SUTURE) IMPLANT
SUT V-LOC BARB 180 2/0GR6 GS22 (SUTURE) ×6
SUT VIC AB 2-0 SH 18 (SUTURE) IMPLANT
SUT VIC AB 2-0 SH 27 (SUTURE)
SUT VIC AB 2-0 SH 27X BRD (SUTURE) IMPLANT
SUT VIC AB 3-0 SH 18 (SUTURE) IMPLANT
SUT VIC AB 4-0 PS2 27 (SUTURE) ×6 IMPLANT
SUT VICRYL 0 UR6 27IN ABS (SUTURE) ×3 IMPLANT
SUTURE V-LC BRB 180 2/0GR6GS22 (SUTURE) ×4 IMPLANT
SYR 10ML ECCENTRIC (SYRINGE) ×3 IMPLANT
SYS LAPSCP GELPORT 120MM (MISCELLANEOUS)
SYSTEM LAPSCP GELPORT 120MM (MISCELLANEOUS) IMPLANT
TOWEL OR 17X26 10 PK STRL BLUE (TOWEL DISPOSABLE) IMPLANT
TOWEL OR NON WOVEN STRL DISP B (DISPOSABLE) ×3 IMPLANT
TRAY FOLEY MTR SLVR 16FR STAT (SET/KITS/TRAYS/PACK) ×3 IMPLANT
TROCAR ADV FIXATION 5X100MM (TROCAR) ×3 IMPLANT
TUBING CONNECTING 10 (TUBING) ×9 IMPLANT
TUBING INSUFFLATION 10FT LAP (TUBING) ×3 IMPLANT
TUBING UROLOGY SET (TUBING) IMPLANT

## 2020-11-19 NOTE — Op Note (Signed)
Procedure: Cystoscopy with bilateral ureteral catheterization and firefly injection.  Preop diagnosis: Colonic stricture with preoperative firefly injection requested.  Postop diagnosis: Same.  Surgeon: Dr. Irine Seal.  Anesthesia: General.  Specimen: None.  Drain: Foley catheter.  Complications: None.  EBL: None.  Indications: The patient is a 45 year old male who has an anastomotic stricture following surgery for colon cancer.  Firefly ureteral injection was requested prior to surgical repair.  Procedure: He had been given cefotetan and for preoperative antibiotic prophylaxis.  A general anesthetic was induced.  He was placed in lithotomy position and fitted with PAS hose.  His perineum and genitalia were prepped with Betadine solution he was draped in usual sterile fashion.  Cystoscopy was performed using a 21 [place] scope and 30 degree lens.  Examination revealed a normal urethra.  The external sphincter was intact.  The prostatic urethra was approximately 2 to 3 cm in length with some lateral lobe hyperplasia and a small obstructing middle lobe.  Examination of bladder revealed moderate trabeculation with a few small cellules.  No mucosal lesions were identified.  Ureteral orifices were unremarkable.  There was a small amount of debris in the bladder.  The right ureteral orifice was cannulated with a 5 [place] open-ended catheter and 7 mL of firefly was gently instilled without resistance.  The left ureteral orifice was cannulated with a 5 [place] open-ended catheter and 7 mL of firefly was gently instilled without resistance.  The cystoscope was removed and a 16 French Foley catheter was inserted.  The balloon was filled with 10 mL of sterile fluid.  The catheter was placed to straight drainage.  The procedure was then turned over Dr. [name].  There were no complications.

## 2020-11-19 NOTE — H&P (Signed)
The patient is a 45 year old male who presents with colorectal cancer. Pt who presented to the office with a new diagnosis of rectal cancer last year.  He had been having abdominal pain and weight loss.  A colonoscopy was performed which showed a mid rectal mass.  Biopsies revealed adenocarcinoma.  CT abdomen and pelvis showed a rectal mass and a possible liver mass.  MRI of the liver is indeterminate hemangioma versus metastatic disease.  CT chest was negative.  He underwent total neoadjuvant chemotherapy with radiation afterwards.     Patient underwent robotic-assisted low anterior resection on 10/15/19.  He presented back to the hospital with abdominal pain and was noted have an anastomotic leak approximally, 3 weeks status post original surgery.  He was placed on TPN and nothing by mouth.  He was taken back to the operating room in mid June for diverting ileostomy.  An intraoperative and CT-guided drain were placed to clear his abscess. He is gaining weight.  He is tolerating a diet without difficulty.  He is having no trouble with his ostomy leaking since changing bag types.  He is using Imodium as needed to keep his outputs thick.  He is up-to-date with his cancer surveillance.  We attempted endoscopic balloon dilation with Dr. Benson Norway.  No progress was able to be made.     Problem List/Past Medical Leighton Ruff, MD; 09/13/9673 5:04 PM) INTRA-ABDOMINAL ABSCESS (K65.1)  VISIT FOR WOUND CHECK (F16.38)  COMPLICATION OF SURGICAL AND MEDICAL CARE, UNSPECIFIED, INITIAL ENCOUNTER (T88.9XXA)  ANASTOMOTIC LEAK OF INTESTINE (K91.89)  FATIGUE (R53.83)  ILEOSTOMY IN PLACE (Z93.2)    Past Surgical History Leighton Ruff, MD; 09/15/6597 5:04 PM) Knee Surgery  Left. Oral Surgery    Diagnostic Studies History Leighton Ruff, MD; 08/15/7015 5:04 PM) Colonoscopy  within last year   Allergies Mammie Lorenzo, LPN; 12/18/3901 0:09 PM) No Known Drug Allergies  [02/18/2019]: Allergies Reconciled    Medication History  Mammie Lorenzo, LPN; 07/15/3005 6:22 PM) Loperamide HCl  (2MG  Capsule, Oral) Active. Medications Reconciled    Social History Leighton Ruff, MD; 11/12/3352 5:04 PM) Alcohol use  Occasional alcohol use. Caffeine use  Coffee, Tea. No drug use  Tobacco use  Former smoker.   Family History Leighton Ruff, MD; 10/15/2561 5:04 PM) Colon Polyps  Father. Hypertension  Father. Migraine Headache  Mother.   Other Problems Leighton Ruff, MD; 01/18/3733 5:04 PM) Hemorrhoids  Rectal Cancer  Sleep Apnea  RECTAL CANCER (C20)          Review of Systems Leighton Ruff MD; 07/21/7679 5:04 PM) General Present- Fatigue and Weight Loss. Not Present- Appetite Loss, Chills, Fever, Night Sweats and Weight Gain. Skin Not Present- Change in Wart/Mole, Dryness, Hives, Jaundice, New Lesions, Non-Healing Wounds, Rash and Ulcer. HEENT Present- Seasonal Allergies. Not Present- Earache, Hearing Loss, Hoarseness, Nose Bleed, Oral Ulcers, Ringing in the Ears, Sinus Pain, Sore Throat, Visual Disturbances, Wears glasses/contact lenses and Yellow Eyes. Respiratory Not Present- Bloody sputum, Chronic Cough, Difficulty Breathing, Snoring and Wheezing. Breast Not Present- Breast Mass, Breast Pain, Nipple Discharge and Skin Changes. Cardiovascular Not Present- Chest Pain, Difficulty Breathing Lying Down, Leg Cramps, Palpitations, Rapid Heart Rate, Shortness of Breath and Swelling of Extremities. Gastrointestinal Present- Abdominal Pain, Change in Bowel Habits and Rectal Pain. Not Present- Bloating, Bloody Stool, Chronic diarrhea, Constipation, Difficulty Swallowing, Excessive gas, Gets full quickly at meals, Hemorrhoids, Indigestion, Nausea and Vomiting. Male Genitourinary Not Present- Blood in Urine, Change in Urinary Stream, Frequency, Impotence, Nocturia, Painful Urination, Urgency and Urine  Leakage. Musculoskeletal Not Present- Back Pain, Joint Pain, Joint Stiffness, Muscle Pain, Muscle Weakness and Swelling of  Extremities. Neurological Not Present- Decreased Memory, Fainting, Headaches, Numbness, Seizures, Tingling, Tremor, Trouble walking and Weakness. Psychiatric Not Present- Anxiety, Bipolar, Change in Sleep Pattern, Depression, Fearful and Frequent crying. Endocrine Not Present- Cold Intolerance, Excessive Hunger, Hair Changes, Heat Intolerance and New Diabetes. Hematology Not Present- Blood Thinners, Easy Bruising, Excessive bleeding, Gland problems, HIV and Persistent Infections.    BP 138/90   Pulse 74   Temp 97.9 F (36.6 C) (Oral)   Resp 18   Ht 6' (1.829 m)   Wt 78.9 kg   SpO2 100%   BMI 23.60 kg/m    Physical Exam    General Mental Status - Alert. General Appearance - Cooperative.  CV: RRR Lungs: CTA Abdomen Palpation/Percussion Palpation and Percussion of the abdomen reveal - Soft and Non Tender.       Assessment & Plan    ANASTOMOTIC LEAK OF INTESTINE (K91.89) Impression: 45 year old male proximally one-year status post anastomotic leak after a proximal low anterior resection. He is diverted with an ileostomy. We have tried to dilate his anastomosis with endoscopic balloons with no success. I have recommended redo resection and most likely coloanal anastomosis. We have discussed this in detail including the risk of redo pelvic surgery. All questions were answered. I have recommended firefighter injection by urology to identify the ureters during our dissection. We will also plan on doing injection of firefighter intravenously for evaluation of perfusion. All questions were answered. I believe he completely understands the risk of the procedure and agrees to proceed with surgery. The surgery and anatomy were described to the patient as well as the risks of surgery and the possible complications. These include: Bleeding, deep abdominal infections and possible wound complications such as hernia and infection, damage to adjacent structures, leak of surgical connections, which can  lead to other surgeries and possibly an ostomy, possible need for other procedures, such as abscess drains in radiology, possible prolonged hospital stay, possible diarrhea from removal of part of the colon, possible constipation from narcotics, possible bowel, bladder or sexual dysfunction if having rectal surgery, prolonged fatigue/weakness or appetite loss, possible early recurrence of of disease, possible complications of their medical problems such as heart disease or arrhythmias or lung problems, death (less than 1%). I believe the patient understands and wishes to proceed with the surgery.

## 2020-11-19 NOTE — Anesthesia Postprocedure Evaluation (Signed)
Anesthesia Post Note  Patient: Brandon Barry  Procedure(s) Performed: XI ROBOTIC ASSISTED REDO OF LOWER ANTERIOR RESECTION WITH ROBOTIC LYSIS OF ADHESIONS (Abdomen) CYSTOSCOPY with FIREFLY INJECTION     Patient location during evaluation: PACU Anesthesia Type: General Level of consciousness: awake and alert and oriented Pain management: pain level controlled Vital Signs Assessment: post-procedure vital signs reviewed and stable Respiratory status: spontaneous breathing, nonlabored ventilation and respiratory function stable Cardiovascular status: blood pressure returned to baseline Postop Assessment: no apparent nausea or vomiting Anesthetic complications: no   No notable events documented.  Last Vitals:  Vitals:   11/19/20 1330 11/19/20 1345  BP: (!) 137/99 (!) 140/103  Pulse: 82 73  Resp: 20 14  Temp:    SpO2: 100% 97%    Last Pain:  Vitals:   11/19/20 1345  TempSrc:   PainSc: [name]

## 2020-11-19 NOTE — Consult Note (Signed)
I was asked to place firefly for ureteral visualization.     I have reviewed the medical record.    I reviewed the procedure with Brandon Barry and discussed the risks of bleeding, infection and ureteral injury.

## 2020-11-19 NOTE — Anesthesia Procedure Notes (Signed)
Arterial Line Insertion Start/End6/03/2021 7:34 AM, 11/19/2020 7:37 AM Performed by: Brennan Bailey, MD, Claudia Desanctis, CRNA, anesthesiologist  Patient location: OR. Preanesthetic checklist: patient identified, IV checked, site marked, risks and benefits discussed, surgical consent, monitors and equipment checked, pre-op evaluation, timeout performed and anesthesia consent Left, radial was placed Catheter size: 20 Fr Hand hygiene performed  and maximum sterile barriers used   Attempts: 1 Procedure performed without using ultrasound guided technique. Following insertion, dressing applied and Biopatch. Post procedure assessment: normal and unchanged  Post procedure complications: unsuccessful attempts and second provider assisted. Patient tolerated the procedure well with no immediate complications. Additional procedure comments: First attempt by CRNA unsuccessful in preop. Left radial artery accessed on first attempt by myself after induction of GETA. Daiva Huge, MD.

## 2020-11-19 NOTE — Anesthesia Procedure Notes (Signed)
Procedure Name: Intubation Date/Time: 11/19/2020 3:52 AM Performed by: Claudia Desanctis, CRNA Pre-anesthesia Checklist: Patient identified, Emergency Drugs available, Suction available and Patient being monitored Patient Re-evaluated:Patient Re-evaluated prior to induction Oxygen Delivery Method: Circle system utilized Preoxygenation: Pre-oxygenation with 100% oxygen Induction Type: IV induction Ventilation: Mask ventilation with difficulty Laryngoscope Size: 2 and Miller Grade View: Grade I Tube type: Oral Tube size: 7.5 mm Number of attempts: 1 Airway Equipment and Method: Stylet Placement Confirmation: ETT inserted through vocal cords under direct vision, positive ETCO2 and breath sounds checked- equal and bilateral Secured at: 22 cm Tube secured with: Tape Dental Injury: Teeth and Oropharynx as per pre-operative assessment  Comments: Large beard making mask seal difficult

## 2020-11-19 NOTE — Op Note (Signed)
11/19/2020  12:22 PM  PATIENT:  Brandon Barry  45 y.o. male  Patient Care Team: [facility] as PCP - General (Internal Medicine) Leighton Ruff, MD as Consulting Physician (Colon and Rectal Surgery) Carol Ada, MD as Consulting Physician (Gastroenterology) Ladell Pier, MD as Consulting Physician (Oncology)  PRE-OPERATIVE DIAGNOSIS:  ANASTOMOTIC STRICTURE  POST-OPERATIVE DIAGNOSIS:  ANASTOMOTIC STRICTURE  PROCEDURE:   XI ROBOTIC ASSISTED REDO LOWER ANTERIOR RESECTION CYSTOSCOPY with FIREFLY INJECTION INTRAOPERATIVE ASSESSMENT OF PERFUSION ROBOTIC LYSIS OF ADHESIONS    Surgeon(s): Leighton Ruff, MD Michael Boston, MD Irine Seal, MD  ASSISTANT: Dr Johney Maine   ANESTHESIA:   local and general  EBL:100 ml  Total I/O In: 3700 [I.V.:3600; IV Piggyback:100] Out: 350 [Urine:250; Blood:100]  Delay start of Pharmacological VTE agent (>24hrs) due to surgical blood loss or risk of bleeding:  no  DRAINS: (19F) Jackson-Pratt drain(s) with closed bulb suction in the pelvis    SPECIMEN:  Source of Specimen:  colorectal anastomosis  DISPOSITION OF SPECIMEN:  PATHOLOGY  COUNTS:  YES  PLAN OF CARE: Admit to inpatient   PATIENT DISPOSITION:  PACU - hemodynamically stable.  INDICATION:    45 y.o. M with anastomotic leak and stricture of a colorectal anastomosis.  I recommended resection of the anastomosis and new low colorectal anastomosis  The anatomy & physiology of the digestive tract was discussed.  The pathophysiology was discussed.  Natural history risks without surgery was discussed.   I worked to give an overview of the disease and the frequent need to have multispecialty involvement.  I feel the risks of no intervention will lead to serious problems that outweigh the operative risks; therefore, I recommended a partial colectomy to remove the pathology.  Laparoscopic & open techniques were discussed.   Risks such as bleeding, infection, abscess, leak,  reoperation, possible ostomy, hernia, heart attack, death, and other risks were discussed.  I noted a good likelihood this will help address the problem.   Goals of post-operative recovery were discussed as well.    The patient expressed understanding & wished to proceed with surgery.  OR FINDINGS:   Patient had multiple malformed staples in the right posterior pelvis.    No obvious metastatic disease on visceral parietal peritoneum or liver.  The anastomosis rests ~6 cm from the anal verge by rigid proctoscopy.  DESCRIPTION:   Informed consent was confirmed.  The patient underwent general anaesthesia without difficulty.  The patient was positioned appropriately.  VTE prevention in place.  The patient's abdomen was clipped, prepped, & draped in a sterile fashion.  Surgical timeout confirmed our plan.  The patient was positioned in reverse Trendelenburg.  Abdominal entry was gained using a varies needle in the LUQ.  Entry was clean.  I induced carbon dioxide insufflation.  An 36mm robotic port was placed in the LUQ.  Camera inspection revealed no injury.  Extra ports were carefully placed under direct laparoscopic visualization after bluntly and sharply clearing his midline adhesions.  The patient was appropriately positioned and the robot was docked to the patient's left side.  Instruments were placed under direct visualization.   I began by robotically lysing adhesions to the abdominal wall.  There were several loops of small bowel that were adherent to his midline scar.  These were taken down using sharp dissection.  A small area of deserosalization was oversewn using interrupted 2-0 Vicryl suture.  I continue to lyse adhesions down into the pelvis.  I was able to mobilize 2 loops  of small bowel out of the pelvis using blunt dissection and sharp dissection both.  I could see the right and left ureters due to the firefly injection and was able to stay clear of these.  The ileostomy was left intact  and the right upper quadrant.  Once the bowel was out of the pelvis I turned my attention to resecting the colorectal anastomosis.  I used the robotic vessel sealer to divide the posterior attachments down to the level of the anastomosis.  I then bluntly freed off the colon portion of the anastomosis.  I then identified the rectal wall circumferentially.  I used robotic scissors and electrocautery to dissect out the rectal wall down to the pelvic floor clearing away the scar tissue as I went.  There was a band of scar tissue impinging on the anastomosis anteriorly.  This was divided with scissors.  I then resected the scar tissue from the remaining rectum using the robotic scissors.  This was sent to pathology for further examination.  I temporarily closed the colon with a umbilical tape.  We then remove the robotic instruments and redocked to the left upper quadrant.  The splenic flexure was taken down using the robotic vessel sealer.  The omentum was cleared from the colon and the lesser sac was divided.  The gastrocolic ligament was divided as well as the splenocolic ligament.  This freed the colon down from the splenic flexure.  We then reversed the robot again and directed it back towards the pelvis.  I divided the mesenteric attachments at midline using the robotic vessel sealer, making sure to avoid the ureter underneath.  I then divided the IMV with a high ligation for added mobility into the pelvis.  Once this was complete the colon easily mobilized into the pelvis.  Firefly was injected to confirm good vascular perfusion to the colonic tissues.  We identified a portion that was well vascularized in the distal colon and divided this using the robotic vessel sealer.  This portion of the colon was removed transrectally using a laparoscopic Ecosac.  Once this was completed, an anastomosis was created between the colon and the rectum using 2, 2 OV lock running sutures.  I confirmed patency of the anastomosis  with a rigid proctoscopy.  Anastomosis rest approximately 6 cm from the anal verge.  I once again confirmed perfusion of the anastomosis using firefly injection intravascularly.  There was good perfusion down to the level of the anastomosis and there was good perfusion of the distal rectal tissues as well.  Once this was complete a 13 [place] Blake drain was placed in the pelvis and brought out through the right upper quadrant 5 mm port site.  The omentum was brought down into the pelvis.  The small bowel was inspected once again.  There was no sign of injury or bleeding.  The robot was undocked and the robotic ports were removed.  The Blake drain was secured to the skin using a 2-0 nylon suture.  The port sites were closed using interrupted 4-0 Vicryl sutures and Dermabond.  An ostomy appliance was placed.  The patient was then awakened from anesthesia and sent to the postanesthesia care unit in stable condition.  All counts were correct per operating room staff.   An MD assistant was necessary for tissue manipulation, retraction and positioning due to the complexity of the case and hospital policies

## 2020-11-19 NOTE — Transfer of Care (Signed)
Immediate Anesthesia Transfer of Care Note  Patient: Nydia Bouton III  Procedure(s) Performed: XI ROBOTIC ASSISTED REDO OF LOWER ANTERIOR RESECTION WITH ROBOTIC LYSIS OF ADHESIONS (Abdomen) CYSTOSCOPY with FIREFLY INJECTION  Patient Location: PACU  Anesthesia Type:General  Level of Consciousness: awake and patient cooperative  Airway & Oxygen Therapy: Patient Spontanous Breathing and Patient connected to face mask  Post-op Assessment: Report given to RN and Post -op Vital signs reviewed and stable  Post vital signs: Reviewed and stable  Last Vitals:  Vitals Value Taken Time  BP 135/104 11/19/20 1240  Temp    Pulse 85 11/19/20 1243  Resp 16 11/19/20 1243  SpO2 100 % 11/19/20 1243  Vitals shown include unvalidated device data.  Last Pain:  Vitals:   11/19/20 0607  TempSrc:   PainSc: 0-No pain      Patients Stated Pain Goal: 4 (75/83/07 4600)  Complications: No notable events documented.

## 2020-11-20 ENCOUNTER — Encounter (HOSPITAL_COMMUNITY): Payer: Self-pay | Admitting: General Surgery

## 2020-11-20 LAB — CBC
HCT: 37.6 % — ABNORMAL LOW (ref 39.0–52.0)
Hemoglobin: 12.7 g/dL — ABNORMAL LOW (ref 13.0–17.0)
MCH: 31.3 pg (ref 26.0–34.0)
MCHC: 33.8 g/dL (ref 30.0–36.0)
MCV: 92.6 fL (ref 80.0–100.0)
Platelets: 190 10*3/uL (ref 150–400)
RBC: 4.06 MIL/uL — ABNORMAL LOW (ref 4.22–5.81)
RDW: 13.3 % (ref 11.5–15.5)
WBC: 8.3 10*3/uL (ref 4.0–10.5)
nRBC: 0 % (ref 0.0–0.2)

## 2020-11-20 LAB — BASIC METABOLIC PANEL
Anion gap: 6 (ref 5–15)
BUN: 12 mg/dL (ref 6–20)
CO2: 28 mmol/L (ref 22–32)
Calcium: 8.3 mg/dL — ABNORMAL LOW (ref 8.9–10.3)
Chloride: 104 mmol/L (ref 98–111)
Creatinine, Ser: 0.81 mg/dL (ref 0.61–1.24)
GFR, Estimated: >60 mL/min (ref >60)
Glucose, Bld: 110 mg/dL — ABNORMAL HIGH (ref 70–99)
Potassium: 3.8 mmol/L (ref 3.5–5.1)
Sodium: 138 mmol/L (ref 135–145)

## 2020-11-20 MED ORDER — CHLORHEXIDINE GLUCONATE CLOTH 2 % EX PADS: 6.0000 | MEDICATED_PAD | Freq: Every day | CUTANEOUS | Status: DC

## 2020-11-20 MED ORDER — HYDROMORPHONE HCL 1 MG/ML IJ SOLN
1.0000 mg | INTRAMUSCULAR | Status: DC | PRN
  Administered 2020-11-20 – 2020-11-22 (×11): 2 mg via INTRAVENOUS
  Filled 2020-11-20 (×12): qty 2

## 2020-11-20 NOTE — Progress Notes (Signed)
Pt said he will place himself on the CPAP, no help needed.

## 2020-11-20 NOTE — Progress Notes (Signed)
MD Armandina Gemma was paged because patient is complaining of pain 8/10, PRN IV Dilaudid was given at 0156 with no relief. Verbal order given for PRN IV Dilaudid 1-2mg  q 2hrs for pain and discontinue IV Dilaudid 0.5mg .

## 2020-11-20 NOTE — Progress Notes (Signed)
1 Day Post-Op   Subjective/Chief Complaint: Pt was very sore overnight and didn't sleep well.  He is otherwise OK without n/v.  He is having stool and gas in his bag.     Objective: Vital signs in last 24 hours: Temp:  [97.6 F (36.4 C)-98.9 F (37.2 C)] 97.7 F (36.5 C) (06/11 0900) Pulse Rate:  [64-89] 67 (06/11 0900) Resp:  [11-29] 18 (06/11 0900) BP: (117-148)/(70-105) 119/83 (06/11 0900) SpO2:  [97 %-100 %] 97 % (06/11 0900) Arterial Line BP: (130-165)/(79-91) 165/81 (06/10 1345) Last BM Date: 11/20/20  Intake/Output from previous day: 06/10 0701 - 06/11 0700 In: 5387 [P.O.:660; I.V.:4527; IV Piggyback:200] Out: 4500 [Urine:4050; Drains:350; Blood:100] Intake/Output this shift: Total I/O In: 120 [P.O.:120] Out: 40 [Drains:40]  General appearance: alert, cooperative, and no distress Resp: breathing comfortably GI: soft, minimally distended, some gas in stoma bag, wounds c/d/I. Drain serosang with some blood around drain site. Extremities: warm, well perfused.  Mild swelling left hand at IV site.  No erythema or warmth. Right AC IV site with some bleeding around it.    Lab Results:  Recent Labs    11/20/20 0451  WBC 8.3  HGB 12.7*  HCT 37.6*  PLT 190   BMET Recent Labs    11/20/20 0451  NA 138  K 3.8  CL 104  CO2 28  GLUCOSE 110*  BUN 12  CREATININE 0.81  CALCIUM 8.3*   PT/INR No results for input(s): LABPROT, INR in the last 72 hours. ABG No results for input(s): PHART, HCO3 in the last 72 hours.  Invalid input(s): PCO2, PO2  Studies/Results: No results found.  Anti-infectives: Anti-infectives (From admission, onward)    Start     Dose/Rate Route Frequency Ordered Stop   11/19/20 2000  cefoTEtan (CEFOTAN) 2 g in sodium chloride 0.9 % 100 mL IVPB        2 g 200 mL/hr over 30 Minutes Intravenous Every 12 hours 11/19/20 1516 11/19/20 2130   11/19/20 0600  cefoTEtan (CEFOTAN) 2 g in sodium chloride 0.9 % 100 mL IVPB        2 g 200 mL/hr over  30 Minutes Intravenous On call to O.R. 11/19/20 0520 11/19/20 0747       Assessment/Plan: s/p Procedure(s): XI ROBOTIC ASSISTED REDO OF LOWER ANTERIOR RESECTION WITH ROBOTIC LYSIS OF ADHESIONS (N/A) CYSTOSCOPY with FIREFLY INJECTION (N/A) 11/19/20- Dr. [name]  d/c foley Advance diet Better pain control Ambulate (he is doing) Home in next 1-2 days depending on pain control, ability to tolerate diet, and volume of stool output.   Lovenox and SCDs for VTE ppx   LOS: 1 day    Brandon Barry 11/20/2020

## 2020-11-21 LAB — BASIC METABOLIC PANEL
Anion gap: 5 (ref 5–15)
BUN: 9 mg/dL (ref 6–20)
CO2: 29 mmol/L (ref 22–32)
Calcium: 8.5 mg/dL — ABNORMAL LOW (ref 8.9–10.3)
Chloride: 102 mmol/L (ref 98–111)
Creatinine, Ser: 0.9 mg/dL (ref 0.61–1.24)
GFR, Estimated: >60 mL/min (ref >60)
Glucose, Bld: 104 mg/dL — ABNORMAL HIGH (ref 70–99)
Potassium: 3.9 mmol/L (ref 3.5–5.1)
Sodium: 136 mmol/L (ref 135–145)

## 2020-11-21 LAB — CBC
HCT: 38.2 % — ABNORMAL LOW (ref 39.0–52.0)
Hemoglobin: 12.5 g/dL — ABNORMAL LOW (ref 13.0–17.0)
MCH: 31 pg (ref 26.0–34.0)
MCHC: 32.7 g/dL (ref 30.0–36.0)
MCV: 94.8 fL (ref 80.0–100.0)
Platelets: 177 10*3/uL (ref 150–400)
RBC: 4.03 MIL/uL — ABNORMAL LOW (ref 4.22–5.81)
RDW: 13.4 % (ref 11.5–15.5)
WBC: 6.6 10*3/uL (ref 4.0–10.5)
nRBC: 0 % (ref 0.0–0.2)

## 2020-11-21 MED ORDER — KETOROLAC TROMETHAMINE 30 MG/ML IJ SOLN
30.0000 mg | Freq: Three times a day (TID) | INTRAMUSCULAR | Status: AC
  Administered 2020-11-21 – 2020-11-22 (×3): 30 mg via INTRAVENOUS
  Filled 2020-11-21 (×3): qty 1

## 2020-11-21 MED ORDER — OXYCODONE HCL 5 MG PO TABS
5.0000 mg | ORAL_TABLET | ORAL | Status: DC | PRN
  Administered 2020-11-21 – 2020-11-22 (×3): 10 mg via ORAL
  Filled 2020-11-21 (×3): qty 2

## 2020-11-21 MED ORDER — METHOCARBAMOL 500 MG PO TABS: 500.0000 mg | ORAL_TABLET | Freq: Four times a day (QID) | ORAL | Status: DC | PRN

## 2020-11-21 NOTE — Progress Notes (Signed)
2 Days Post-Op   Subjective/Chief Complaint: Urinating well since foley removed.  Tolerating regular diet.  Still very sore.  Ostomy output not measured as pt is emptying himself, but he states it is the correct consistency.    Objective: Vital signs in last 24 hours: Temp:  [98.1 F (36.7 C)-99.1 F (37.3 C)] 99 F (37.2 C) (06/12 0627) Pulse Rate:  [75-85] 76 (06/12 0627) Resp:  [17-18] 17 (06/12 0627) BP: (118-130)/(83-87) 118/87 (06/12 0627) SpO2:  [96 %-97 %] 96 % (06/12 0627) Last BM Date: 11/20/20  Intake/Output from previous day: 06/11 0701 - 06/12 0700 In: 940 [P.O.:720; I.V.:220] Out: 9728 [Urine:2900; Drains:145] Intake/Output this shift: Total I/O In: 240 [P.O.:240] Out: -   General appearance: alert, cooperative, and no distress Resp: breathing comfortably GI: soft, minimally distended, some gas and stool in stoma bag, wounds c/d/I. Drain serosang. Extremities: warm, well perfused.    Lab Results:  Recent Labs    11/20/20 0451 11/21/20 0508  WBC 8.3 6.6  HGB 12.7* 12.5*  HCT 37.6* 38.2*  PLT 190 177   BMET Recent Labs    11/20/20 0451 11/21/20 0508  NA 138 136  K 3.8 3.9  CL 104 102  CO2 28 29  GLUCOSE 110* 104*  BUN 12 9  CREATININE 0.81 0.90  CALCIUM 8.3* 8.5*   PT/INR No results for input(s): LABPROT, INR in the last 72 hours. ABG No results for input(s): PHART, HCO3 in the last 72 hours.  Invalid input(s): PCO2, PO2  Studies/Results: No results found.  Anti-infectives: Anti-infectives (From admission, onward)    Start     Dose/Rate Route Frequency Ordered Stop   11/19/20 2000  cefoTEtan (CEFOTAN) 2 g in sodium chloride 0.9 % 100 mL IVPB        2 g 200 mL/hr over 30 Minutes Intravenous Every 12 hours 11/19/20 1516 11/19/20 2130   11/19/20 0600  cefoTEtan (CEFOTAN) 2 g in sodium chloride 0.9 % 100 mL IVPB        2 g 200 mL/hr over 30 Minutes Intravenous On call to O.R. 11/19/20 0520 11/19/20 0747        Assessment/Plan: s/p Procedure(s): XI ROBOTIC ASSISTED REDO OF LOWER ANTERIOR RESECTION WITH ROBOTIC LYSIS OF ADHESIONS (N/A) CYSTOSCOPY with FIREFLY INJECTION (N/A) 11/19/20- Dr. [name]  PRN oxy, standing tylenol and gabapentin.  Add 24 hours of toradol and standing robaxin.  Ambulate (he is doing) Hopefully home in AM.    Lovenox and SCDs for VTE ppx   LOS: 2 days    Stark Klein 11/21/2020

## 2020-11-21 NOTE — Progress Notes (Signed)
Pt prefer self placement of the CPAP.

## 2020-11-22 ENCOUNTER — Encounter: Payer: Self-pay | Admitting: Oncology

## 2020-11-22 ENCOUNTER — Other Ambulatory Visit (HOSPITAL_COMMUNITY): Payer: Self-pay

## 2020-11-22 LAB — CBC
HCT: 36.9 % — ABNORMAL LOW (ref 39.0–52.0)
Hemoglobin: 12 g/dL — ABNORMAL LOW (ref 13.0–17.0)
MCH: 30.7 pg (ref 26.0–34.0)
MCHC: 32.5 g/dL (ref 30.0–36.0)
MCV: 94.4 fL (ref 80.0–100.0)
Platelets: 170 10*3/uL (ref 150–400)
RBC: 3.91 MIL/uL — ABNORMAL LOW (ref 4.22–5.81)
RDW: 13.2 % (ref 11.5–15.5)
WBC: 5.1 10*3/uL (ref 4.0–10.5)
nRBC: 0 % (ref 0.0–0.2)

## 2020-11-22 LAB — BASIC METABOLIC PANEL
Anion gap: 4 — ABNORMAL LOW (ref 5–15)
BUN: 14 mg/dL (ref 6–20)
CO2: 28 mmol/L (ref 22–32)
Calcium: 8.4 mg/dL — ABNORMAL LOW (ref 8.9–10.3)
Chloride: 106 mmol/L (ref 98–111)
Creatinine, Ser: 0.83 mg/dL (ref 0.61–1.24)
GFR, Estimated: >60 mL/min (ref >60)
Glucose, Bld: 90 mg/dL (ref 70–99)
Potassium: 4.2 mmol/L (ref 3.5–5.1)
Sodium: 138 mmol/L (ref 135–145)

## 2020-11-22 LAB — SURGICAL PATHOLOGY

## 2020-11-22 MED ORDER — LOPERAMIDE HCL 2 MG PO CAPS: 2.0000 mg | ORAL_CAPSULE | Freq: Every day | ORAL | Status: DC

## 2020-11-22 MED ORDER — OXYCODONE HCL 5 MG PO TABS
5.0000 mg | ORAL_TABLET | Freq: Four times a day (QID) | ORAL | 0 refills | Status: DC | PRN
  Filled 2020-11-22: qty 30, 4d supply, fill #0

## 2020-11-22 NOTE — Discharge Instructions (Signed)
ABDOMINAL SURGERY: POST OP INSTRUCTIONS  DIET: Follow a light bland diet the first 24 hours after arrival home, such as soup, liquids, crackers, etc.  Be sure to include lots of fluids daily.  Avoid fast food or heavy meals as your are more likely to get nauseated.  Do not eat any uncooked fruits or vegetables for the next 2 weeks as your colon heals. Take your usually prescribed home medications unless otherwise directed. PAIN CONTROL: Pain is best controlled by a usual combination of three different methods TOGETHER: Ice/Heat Over the counter pain medication Prescription pain medication Most patients will experience some swelling and bruising around the incisions.  Ice packs or heating pads (30-60 minutes up to 6 times a day) will help. Use ice for the first few days to help decrease swelling and bruising, then switch to heat to help relax tight/sore spots and speed recovery.  Some people prefer to use ice alone, heat alone, alternating between ice & heat.  Experiment to what works for you.  Swelling and bruising can take several weeks to resolve.   It is helpful to take an over-the-counter pain medication regularly for the first few weeks.  Choose one of the following that works best for you: Naproxen (Aleve, etc)  Two 220mg  tabs twice a day Ibuprofen (Advil, etc) Three 200mg  tabs four times a day (every meal & bedtime) Acetaminophen (Tylenol, etc) 500-650mg  four times a day (every meal & bedtime) A  prescription for pain medication (such as oxycodone, hydrocodone, etc) should be given to you upon discharge.  Take your pain medication as prescribed.  If you are having problems/concerns with the prescription medicine (does not control pain, nausea, vomiting, rash, itching, etc), please call us [phone] to see if we need to switch you to a different pain medicine that will work better for you and/or control your side effect better. If you need a refill on your pain medication, please contact  your pharmacy.  They will contact our office to request authorization. Prescriptions will not be filled after 5 pm or on week-ends. Watch out for diarrhea.  If you have many loose bowel movements, simplify your diet to bland foods & liquids for a few days.  Stop any stool softeners and decrease your fiber supplement.  Switching to mild anti-diarrheal medications (Kayopectate, Pepto Bismol) can help.  If this worsens or does not improve, please call us. Wash / shower every day.  You may shower over the incision / wound.  Avoid baths until the skin is fully healed.  Continue to shower over incision(s) after the dressing is off.  You may leave the incision open to air.  You may replace a dressing/Band-Aid to cover the incision for comfort if you wish. ACTIVITIES as tolerated:   You may resume regular (light) daily activities beginning the next day--such as daily self-care, walking, climbing stairs--gradually increasing activities as tolerated.  If you can walk 30 minutes without difficulty, it is safe to try more intense activity such as jogging, treadmill, bicycling, low-impact aerobics, swimming, etc. Save the most intensive and strenuous activity for last such as sit-ups, heavy lifting, contact sports, etc  Refrain from any heavy lifting or straining until you are off narcotics for pain control.   DO NOT PUSH THROUGH PAIN.  Let pain be your guide: If it hurts to do something, don't do it.  Pain is your body warning you to avoid that activity for another week until the pain goes down. You may drive when  you are no longer taking prescription pain medication, you can comfortably wear a seatbelt, and you can safely maneuver your car and apply brakes. You may have sexual intercourse when it is comfortable.  FOLLOW UP in our office Please call [facility] at [phone] to set up an appointment to see your surgeon in the office for a follow-up appointment approximately 1-2 weeks after your surgery. Make sure that  you call for this appointment the day you arrive home to insure a convenient appointment time. 10. IF YOU HAVE DISABILITY OR FAMILY LEAVE FORMS, BRING THEM TO THE OFFICE FOR PROCESSING.  DO NOT GIVE THEM TO YOUR DOCTOR.   WHEN TO CALL us [phone]: Poor pain control Reactions / problems with new medications (rash/itching, nausea, etc)  Fever over 101.5 F (38.5 C) Inability to urinate Nausea and/or vomiting Worsening swelling or bruising Continued bleeding from incision. Increased pain, redness, or drainage from the incision  The clinic staff is available to answer your questions during regular business hours (8:30am-5pm).  Please don't hesitate to call and ask to speak to one of our nurses for clinical concerns.   A surgeon from [facility] is always on call at the hospitals   If you have a medical emergency, go to the nearest emergency room or call 911.    [facility], [address], [place], [place], [place]  [address] ? MAIN: [phone] ? TOLL FREE: [phone] ? FAX [phone] [email or web address]

## 2020-11-22 NOTE — Discharge Summary (Signed)
Physician Discharge Summary  Patient ID: Brandon Barry MRN: [id number] DOB/AGE: 1975-12-01 45 y.o.  Admit date: 11/19/2020 Discharge date: 11/22/2020  Admission Diagnoses: anastomotic stricture  Discharge Diagnoses:  Active Problems:   Anastomotic stricture of colorectal region   Discharged Condition: good  Hospital Course: Patient admitted after surgery.  Diet advanced as tolerated.  Patient felt to be in stable condition for discharge on POD 3  Consults: None  Significant Diagnostic Studies: labs: cbc, bmet  Treatments: IV hydration, analgesia: Dilaudid, and surgery: robotic redo LAR  Discharge Exam: Blood pressure 120/71, pulse 71, temperature 98.8 F (37.1 C), temperature source Oral, resp. rate 17, height 6' (1.829 m), weight 78.9 kg, SpO2 98 %. General appearance: alert and cooperative GI: soft, nondistended Incision/Wound: clean dry intact  Disposition: home   Allergies as of 11/22/2020   No Known Allergies      Medication List     TAKE these medications    acetaminophen 500 MG tablet Commonly known as: TYLENOL Take 1,000 mg by mouth every 6 (six) hours as needed for moderate pain or headache.   loperamide 2 MG capsule Commonly known as: IMODIUM Take 1 capsule (2 mg total) by mouth daily before lunch.   oxyCODONE 5 MG immediate release tablet Commonly known as: Oxy IR/ROXICODONE Take 1-2 tablets (5-10 mg total) by mouth every 6 (six) hours as needed for moderate pain, severe pain or breakthrough pain.         Signed: Rosario Adie 02/21/2582, 8:42 AM

## 2020-11-22 NOTE — Progress Notes (Signed)
Discharge instructions given to patient and all questions were answered.  

## 2020-11-23 ENCOUNTER — Encounter (HOSPITAL_COMMUNITY): Payer: Self-pay | Admitting: *Deleted

## 2020-11-23 ENCOUNTER — Emergency Department (HOSPITAL_COMMUNITY): Payer: Commercial Managed Care - PPO

## 2020-11-23 ENCOUNTER — Other Ambulatory Visit: Payer: Self-pay

## 2020-11-23 ENCOUNTER — Inpatient Hospital Stay (HOSPITAL_COMMUNITY)
Admission: EM | Admit: 2020-11-23 | Discharge: 2020-11-28 | DRG: 394 | Disposition: A | Discharge status: Home / Self Care | Payer: Commercial Managed Care - PPO | Attending: General Surgery | Admitting: General Surgery

## 2020-11-23 DIAGNOSIS — K567 Ileus, unspecified: Secondary | ICD-10-CM | POA: Diagnosis present

## 2020-11-23 DIAGNOSIS — K9189 Other postprocedural complications and disorders of digestive system: Principal | ICD-10-CM | POA: Diagnosis present

## 2020-11-23 DIAGNOSIS — Z87891 Personal history of nicotine dependence: Secondary | ICD-10-CM

## 2020-11-23 DIAGNOSIS — Y838 Other surgical procedures as the cause of abnormal reaction of the patient, or of later complication, without mention of misadventure at the time of the procedure: Secondary | ICD-10-CM | POA: Diagnosis present

## 2020-11-23 DIAGNOSIS — Z8249 Family history of ischemic heart disease and other diseases of the circulatory system: Secondary | ICD-10-CM

## 2020-11-23 DIAGNOSIS — F411 Generalized anxiety disorder: Secondary | ICD-10-CM | POA: Diagnosis present

## 2020-11-23 DIAGNOSIS — G4733 Obstructive sleep apnea (adult) (pediatric): Secondary | ICD-10-CM | POA: Diagnosis present

## 2020-11-23 DIAGNOSIS — Z85048 Personal history of other malignant neoplasm of rectum, rectosigmoid junction, and anus: Secondary | ICD-10-CM

## 2020-11-23 DIAGNOSIS — Z20822 Contact with and (suspected) exposure to covid-19: Secondary | ICD-10-CM | POA: Diagnosis present

## 2020-11-23 DIAGNOSIS — G629 Polyneuropathy, unspecified: Secondary | ICD-10-CM | POA: Diagnosis present

## 2020-11-23 DIAGNOSIS — G8918 Other acute postprocedural pain: Secondary | ICD-10-CM

## 2020-11-23 DIAGNOSIS — J309 Allergic rhinitis, unspecified: Secondary | ICD-10-CM | POA: Diagnosis present

## 2020-11-23 DIAGNOSIS — Z79899 Other long term (current) drug therapy: Secondary | ICD-10-CM

## 2020-11-23 DIAGNOSIS — E785 Hyperlipidemia, unspecified: Secondary | ICD-10-CM | POA: Diagnosis present

## 2020-11-23 LAB — CBC WITH DIFFERENTIAL/PLATELET
Abs Immature Granulocytes: 0.04 10*3/uL (ref 0.00–0.07)
Basophils Absolute: 0 10*3/uL (ref 0.0–0.1)
Basophils Relative: 0 %
Eosinophils Absolute: 0 10*3/uL (ref 0.0–0.5)
Eosinophils Relative: 0 %
HCT: 44.1 % (ref 39.0–52.0)
Hemoglobin: 14.9 g/dL (ref 13.0–17.0)
Immature Granulocytes: 0 %
Lymphocytes Relative: 4 %
Lymphs Abs: 0.5 10*3/uL — ABNORMAL LOW (ref 0.7–4.0)
MCH: 31 pg (ref 26.0–34.0)
MCHC: 33.8 g/dL (ref 30.0–36.0)
MCV: 91.9 fL (ref 80.0–100.0)
Monocytes Absolute: 0.6 10*3/uL (ref 0.1–1.0)
Monocytes Relative: 5 %
Neutro Abs: 10.2 10*3/uL — ABNORMAL HIGH (ref 1.7–7.7)
Neutrophils Relative %: 91 %
Platelets: 287 10*3/uL (ref 150–400)
RBC: 4.8 MIL/uL (ref 4.22–5.81)
RDW: 13.4 % (ref 11.5–15.5)
WBC: 11.3 10*3/uL — ABNORMAL HIGH (ref 4.0–10.5)
nRBC: 0 % (ref 0.0–0.2)

## 2020-11-23 LAB — COMPREHENSIVE METABOLIC PANEL
ALT: 161 U/L — ABNORMAL HIGH (ref 0–44)
AST: 56 U/L — ABNORMAL HIGH (ref 15–41)
Albumin: 3.7 g/dL (ref 3.5–5.0)
Alkaline Phosphatase: 169 U/L — ABNORMAL HIGH (ref 38–126)
Anion gap: 8 (ref 5–15)
BUN: 12 mg/dL (ref 6–20)
CO2: 28 mmol/L (ref 22–32)
Calcium: 9.3 mg/dL (ref 8.9–10.3)
Chloride: 104 mmol/L (ref 98–111)
Creatinine, Ser: 0.89 mg/dL (ref 0.61–1.24)
GFR, Estimated: >60 mL/min (ref >60)
Glucose, Bld: 114 mg/dL — ABNORMAL HIGH (ref 70–99)
Potassium: 3.6 mmol/L (ref 3.5–5.1)
Sodium: 140 mmol/L (ref 135–145)
Total Bilirubin: 0.9 mg/dL (ref 0.3–1.2)
Total Protein: 7.4 g/dL (ref 6.5–8.1)

## 2020-11-23 LAB — URINALYSIS, ROUTINE W REFLEX MICROSCOPIC
Bilirubin Urine: NEGATIVE
Glucose, UA: NEGATIVE mg/dL
Hgb urine dipstick: NEGATIVE
Ketones, ur: 80 mg/dL — AB
Leukocytes,Ua: NEGATIVE
Nitrite: NEGATIVE
Protein, ur: NEGATIVE mg/dL
Specific Gravity, Urine: 1.028 (ref 1.005–1.030)
pH: 5 (ref 5.0–8.0)

## 2020-11-23 LAB — LIPASE, BLOOD: Lipase: 22 U/L (ref 11–51)

## 2020-11-23 MED ORDER — PROCHLORPERAZINE EDISYLATE 10 MG/2ML IJ SOLN: 5.0000 mg | Freq: Four times a day (QID) | INTRAMUSCULAR | Status: DC | PRN

## 2020-11-23 MED ORDER — DIPHENHYDRAMINE HCL 50 MG/ML IJ SOLN: 25.0000 mg | Freq: Four times a day (QID) | INTRAMUSCULAR | Status: DC | PRN

## 2020-11-23 MED ORDER — METOPROLOL TARTRATE 5 MG/5ML IV SOLN: 5.0000 mg | Freq: Four times a day (QID) | INTRAVENOUS | Status: DC | PRN

## 2020-11-23 MED ORDER — ACETAMINOPHEN 10 MG/ML IV SOLN
1000.0000 mg | Freq: Four times a day (QID) | INTRAVENOUS | Status: AC
  Administered 2020-11-23 – 2020-11-24 (×4): 1000 mg via INTRAVENOUS
  Filled 2020-11-23 (×5): qty 100

## 2020-11-23 MED ORDER — DIPHENHYDRAMINE HCL 25 MG PO CAPS: 25.0000 mg | ORAL_CAPSULE | Freq: Four times a day (QID) | ORAL | Status: DC | PRN

## 2020-11-23 MED ORDER — ONDANSETRON HCL 4 MG/2ML IJ SOLN
4.0000 mg | Freq: Four times a day (QID) | INTRAMUSCULAR | Status: DC | PRN
  Administered 2020-11-24 (×2): 4 mg via INTRAVENOUS
  Filled 2020-11-23 (×2): qty 2

## 2020-11-23 MED ORDER — HYDRALAZINE HCL 20 MG/ML IJ SOLN: 10.0000 mg | INTRAMUSCULAR | Status: DC | PRN

## 2020-11-23 MED ORDER — HYDROMORPHONE HCL 1 MG/ML IJ SOLN
1.0000 mg | Freq: Once | INTRAMUSCULAR | Status: AC
  Administered 2020-11-23: 1 mg via INTRAVENOUS
  Filled 2020-11-23: qty 1

## 2020-11-23 MED ORDER — SIMETHICONE 80 MG PO CHEW
40.0000 mg | CHEWABLE_TABLET | Freq: Four times a day (QID) | ORAL | Status: DC | PRN
  Filled 2020-11-23: qty 1

## 2020-11-23 MED ORDER — HYDROMORPHONE HCL 1 MG/ML IJ SOLN
1.0000 mg | Freq: Once | INTRAMUSCULAR | Status: AC
Start: 2020-11-23 — End: 2020-11-23
  Administered 2020-11-23: 1 mg via INTRAVENOUS
  Filled 2020-11-23: qty 1

## 2020-11-23 MED ORDER — ONDANSETRON 4 MG PO TBDP
4.0000 mg | ORAL_TABLET | Freq: Four times a day (QID) | ORAL | Status: DC | PRN
Start: 2020-11-23 — End: 2020-11-28

## 2020-11-23 MED ORDER — ONDANSETRON HCL 4 MG/2ML IJ SOLN
4.0000 mg | Freq: Once | INTRAMUSCULAR | Status: AC
  Administered 2020-11-23: 4 mg via INTRAVENOUS
  Filled 2020-11-23: qty 2

## 2020-11-23 MED ORDER — ENOXAPARIN SODIUM 40 MG/0.4ML IJ SOSY
40.0000 mg | PREFILLED_SYRINGE | INTRAMUSCULAR | Status: DC
  Administered 2020-11-24 – 2020-11-27 (×4): 40 mg via SUBCUTANEOUS
  Filled 2020-11-23 (×4): qty 0.4

## 2020-11-23 MED ORDER — HYDROMORPHONE HCL 1 MG/ML IJ SOLN
1.0000 mg | INTRAMUSCULAR | Status: DC | PRN
  Administered 2020-11-23 – 2020-11-24 (×5): 1 mg via INTRAVENOUS
  Administered 2020-11-24: 2 mg via INTRAVENOUS
  Administered 2020-11-24: 1 mg via INTRAVENOUS
  Administered 2020-11-25: 2 mg via INTRAVENOUS
  Administered 2020-11-25 (×5): 1 mg via INTRAVENOUS
  Administered 2020-11-26: 2 mg via INTRAVENOUS
  Filled 2020-11-23 (×2): qty 1
  Filled 2020-11-23: qty 2
  Filled 2020-11-23: qty 1
  Filled 2020-11-23 (×2): qty 2
  Filled 2020-11-23 (×8): qty 1

## 2020-11-23 MED ORDER — KCL IN DEXTROSE-NACL 20-5-0.45 MEQ/L-%-% IV SOLN
INTRAVENOUS | Status: DC
  Filled 2020-11-23 (×5): qty 1000

## 2020-11-23 MED ORDER — KETOROLAC TROMETHAMINE 15 MG/ML IJ SOLN: 15.0000 mg | Freq: Four times a day (QID) | INTRAMUSCULAR | Status: DC | PRN

## 2020-11-23 MED ORDER — SODIUM CHLORIDE 0.9 % IV BOLUS
1000.0000 mL | Freq: Once | INTRAVENOUS | Status: AC
  Administered 2020-11-23: 1000 mL via INTRAVENOUS

## 2020-11-23 MED ORDER — MORPHINE SULFATE (PF) 4 MG/ML IV SOLN: 4.0000 mg | Freq: Once | INTRAVENOUS | Status: DC

## 2020-11-23 MED ORDER — PROCHLORPERAZINE MALEATE 10 MG PO TABS
10.0000 mg | ORAL_TABLET | Freq: Four times a day (QID) | ORAL | Status: DC | PRN
  Filled 2020-11-23: qty 1

## 2020-11-23 NOTE — ED Provider Notes (Signed)
Received patient as a handoff at shift change from Domenic Moras, PA-C.  In short, patient is a 45 year old male with colorectal cancer s/p redo resection and coloanal anastomosis in context of anastomotic leak performed 11/19/2020 who returns to the ED for postop complications.  Surgery was performed by Dr. Leighton Ruff.  Evidently patient was feeling improved after discharge from hospital, but then woke up this morning with severe abdominal pain which prompted him to come to the ED for further evaluation.  He also endorsed decreased output from his ostomy bag as well as leakage around JP drain.  There was significant abdominal tenderness on exam.  He is being treated with Dilaudid and Zofran.  Plan at time of discharge is to follow-up on CT findings and consult general surgery for admission or management recommendations.  Physical Exam  BP 131/89 (BP Location: Right Arm)   Pulse 81   Temp 98.1 F (36.7 C) (Oral)   Resp 17   SpO2 97%   Physical Exam Vitals and nursing note reviewed. Exam conducted with a chaperone present.  Constitutional:      Appearance: He is diaphoretic.     Comments: Uncomfortable.  HENT:     Head: Normocephalic and atraumatic.  Eyes:     General: No scleral icterus.    Conjunctiva/sclera: Conjunctivae normal.  Cardiovascular:     Rate and Rhythm: Normal rate.     Pulses: Normal pulses.  Pulmonary:     Effort: Pulmonary effort is normal. No respiratory distress.  Abdominal:     General: Abdomen is flat.     Palpations: Abdomen is soft.     Tenderness: There is abdominal tenderness.     Comments: Tender abdomen most notably in periumbilical and suprapubic region.  Mild leakage around JP site in right lower quadrant.  Ostomy bag without complication.  Neurological:     Mental Status: He is alert.     GCS: GCS eye subscore is 4. GCS verbal subscore is 5. GCS motor subscore is 6.  Psychiatric:        Mood and Affect: Mood normal.        Behavior: Behavior  normal.        Thought Content: Thought content normal.    ED Course/Procedures   Clinical Course as of 11/23/20 1731  Tue Nov 23, 2020  1641 CT ABDOMEN PELVIS WO CONTRAST [GG]  0865 I spoke with Dr. Rush Farmer, general surgery.  He is actually coming off of his on call service, but will speak with one of his partners and they will come evaluate patient in the ER to determine disposition. [GG]    Clinical Course User Index [GG] Corena Herter, PA-C    Procedures Results for orders placed or performed during the hospital encounter of 11/23/20  CBC with Differential  Result Value Ref Range   WBC 11.3 (H) 4.0 - 10.5 K/uL   RBC 4.80 4.22 - 5.81 MIL/uL   Hemoglobin 14.9 13.0 - 17.0 g/dL   HCT 44.1 39.0 - 52.0 %   MCV 91.9 80.0 - 100.0 fL   MCH 31.0 26.0 - 34.0 pg   MCHC 33.8 30.0 - 36.0 g/dL   RDW 13.4 11.5 - 15.5 %   Platelets 287 150 - 400 K/uL   nRBC 0.0 0.0 - 0.2 %   Neutrophils Relative % 91 %   Neutro Abs 10.2 (H) 1.7 - 7.7 K/uL   Lymphocytes Relative 4 %   Lymphs Abs 0.5 (L) 0.7 - 4.0 K/uL  Monocytes Relative 5 %   Monocytes Absolute 0.6 0.1 - 1.0 K/uL   Eosinophils Relative 0 %   Eosinophils Absolute 0.0 0.0 - 0.5 K/uL   Basophils Relative 0 %   Basophils Absolute 0.0 0.0 - 0.1 K/uL   Immature Granulocytes 0 %   Abs Immature Granulocytes 0.04 0.00 - 0.07 K/uL  Comprehensive metabolic panel  Result Value Ref Range   Sodium 140 135 - 145 mmol/L   Potassium 3.6 3.5 - 5.1 mmol/L   Chloride 104 98 - 111 mmol/L   CO2 28 22 - 32 mmol/L   Glucose, Bld 114 (H) 70 - 99 mg/dL   BUN 12 6 - 20 mg/dL   Creatinine, Ser 0.89 0.61 - 1.24 mg/dL   Calcium 9.3 8.9 - 10.3 mg/dL   Total Protein 7.4 6.5 - 8.1 g/dL   Albumin 3.7 3.5 - 5.0 g/dL   AST 56 (H) 15 - 41 U/L   ALT 161 (H) 0 - 44 U/L   Alkaline Phosphatase 169 (H) 38 - 126 U/L   Total Bilirubin 0.9 0.3 - 1.2 mg/dL   GFR, Estimated >60 >60 mL/min   Anion gap 8 5 - 15  Lipase, blood  Result Value Ref Range   Lipase 22  11 - 51 U/L   CT ABDOMEN PELVIS WO CONTRAST  Result Date: 11/23/2020 CLINICAL DATA:  Acute abdominal pain, recent abdominal surgery, worsening pain, pain around drain site as well as in mid and lower abdomen. Post robotic assisted low anterior resection on 11/19/2020 for rectal adenocarcinoma EXAM: CT ABDOMEN AND PELVIS WITHOUT CONTRAST TECHNIQUE: Multidetector CT imaging of the abdomen and pelvis was performed following the standard protocol without IV contrast. Sagittal and coronal MPR images reconstructed from axial data set. No oral contrast administered. COMPARISON:  08/18/2020 FINDINGS: Lower chest: Subsegmental atelectasis BILATERAL lower lobes and at base of RIGHT middle lobe. Hepatobiliary: Gallbladder and liver normal appearance Pancreas: Normal appearance Spleen: Normal appearance Adrenals/Urinary Tract: Adrenal glands, kidneys, ureters, and bladder normal appearance Stomach/Bowel: Normal appendix. Stomach normal appearance. Diverting loop ileostomy in the RIGHT mid abdomen unchanged. Colon decompressed throughout its length. No abnormal fluid collections adjacent to colorectal anastomosis. Multiple dilated small bowel loops are seen up to 4.5 cm diameter with gradual transition from dilated to nondilated small bowel in the RIGHT pelvis, likely postoperative ileus. Minimal bowel wall thickening of some of the small bowel loops, which may be related to surgery. Infiltration of associated mesentery. No evidence of perforation. Vascular/Lymphatic: Aorta normal caliber.  No adenopathy. Reproductive: Unremarkable. Other: No free air or free fluid. No hernia or focal fluid collection. Stranding in presacral space consistent with preceding surgery. Surgical drain in pelvis. Musculoskeletal: Unremarkable IMPRESSION: Dilated small bowel loops up to 4.5 cm diameter with gradual transition from dilated to nondilated small bowel in the RIGHT pelvis, favor ileus in the postoperative period although obstruction is  not entirely excluded. No evidence of perforation or abscess. Bibasilar subsegmental atelectasis. Electronically Signed   By: Lavonia Dana M.D.   On: 11/23/2020 16:16    MDM   CT abdomen pelvis demonstrates dilated small bowel loops up to 4.5 cm diameter with gradual transition from dilated to nondilated small bowel and right pelvis.  Favor ileus in context of postop period, but cannot exclude obstruction.  No evidence of perforation or abscess.  On my examination, patient reports that he was perfectly fine last night and was even able to eat pizza.  This morning he woke up with severe periumbilical  and lower abdominal pain.  He has been having nausea, emesis, and has not been able to tolerate any food or liquid intake.  He also reports drainage at the [place] site.  He had voiced concern for another anastomotic leak.  Wife is at bedside.  Given patient's symptoms, will consult general surgery for admission.  I spoke with Dr. Rush Farmer, general surgery.  He is actually coming off of his on call service, but will speak with one of his partners and they will come evaluate patient in the ER to determine disposition.  General surgery will admit.   Corena Herter, PA-C 11/23/20 1731    Lacretia Leigh, MD 11/25/20 2253

## 2020-11-23 NOTE — ED Provider Notes (Signed)
[facility] DEPT Provider Note   CSN: [id number] Arrival date & time: 11/23/20  1247     History Chief Complaint  Patient presents with   Post-op Problem    Brandon Barry is a 45 y.o. male.  The history is provided by the patient, the spouse and medical records. No language interpreter was used.   45 year old male significant history of rectal adenocarcinoma who developed anastomotic stricture following surgery for colon cancer presenting today with complaint of abdominal pain.  16 11/2020 patient undergoing robotic assisted low anterior resection and cystoscopy as well as robotic lysis of adhesions of his anastomotic colon stricture.  He was hospitalized for 3 days postop and was discharged yesterday.  Patient states he felt better once discharged however this morning when he woke up he reported having severe abdominal pain.  Pain is throughout abdomen sharp achy persistent.  He endorsed nausea, vomited a few times.  He also noticed decreased output from his ostomy bag as well as fluid leakage around his JP drain.  Symptoms felt similar to prior episode last year of similar abdominal surgery which leads to anatostomic breakdown of the adhesion of the colon and cause complications.  He is able to urinate.  Past Medical History:  Diagnosis Date   Hyperlipidemia    OSA on CPAP    Peripheral neuropathy    Hands and feet from Chemo   Rectal adenocarcinoma (HCC)    Right shoulder pain     Patient Active Problem List   Diagnosis Date Noted   Anastomotic stricture of colorectal region 11/19/2020   Medication management 10/12/2020   OSA on CPAP 11/01/2019   Peripheral neuropathy    Hyperlipidemia    Chronic back pain greater than 3 months duration    Right shoulder pain    Intraabdominal abscess 10/31/2019   Hand foot syndrome w chemotherapy 04/04/2019   Port-A-Cath in place 03/27/2019   Cancer of rectum  ypT3, ypN0 s/p TNT & robotic LAR 10/15/2019  02/19/2019   Pain in left knee 07/22/2018   Anxiety state 03/18/2007   DEPRESSION 03/18/2007   Allergic rhinitis 03/18/2007    Past Surgical History:  Procedure Laterality Date   APPLICATION OF WOUND VAC N/A 11/25/2019   Procedure: APPLICATION OF WOUND VAC AND DRAIN PLACEMENT;  Surgeon: Leighton Ruff, MD;  Location: [facility];  Service: General;  Laterality: N/A;   BALLOON DILATION N/A 06/03/2020   Procedure: Stacie Acres;  Surgeon: Carol Ada, MD;  [facility]: [facility];  Service: Endoscopy;  Laterality: N/A;   BALLOON DILATION N/A 07/30/2020   Procedure: BALLOON DILATION;  Surgeon: Carol Ada, MD;  [facility]: [facility];  Service: Endoscopy;  Laterality: N/A;   COLONOSCOPY  02/04/2019   FLEXIBLE SIGMOIDOSCOPY N/A 04/22/2020   Procedure: FLEXIBLE SIGMOIDOSCOPY;  Surgeon: Leighton Ruff, MD;  [facility];  Service: Endoscopy;  Laterality: N/A;   FLEXIBLE SIGMOIDOSCOPY N/A 06/03/2020   Procedure: FLEXIBLE SIGMOIDOSCOPY;  Surgeon: Carol Ada, MD;  [facility];  Service: Endoscopy;  Laterality: N/A;   FLEXIBLE SIGMOIDOSCOPY N/A 07/30/2020   Procedure: FLEXIBLE SIGMOIDOSCOPY;  Surgeon: Carol Ada, MD;  [facility];  Service: Endoscopy;  Laterality: N/A;   ILEOSTOMY Right 11/25/2019   Procedure: PLACEMENT OF ILEOSTOMY;  Surgeon: Leighton Ruff, MD;  Location: [facility];  Service: General;  Laterality: Right;   IR CATHETER TUBE CHANGE  11/11/2019   IR PATIENT EVAL TECH 0-60 MINS  12/22/2019   KNEE ARTHROSCOPY Left 11/19/2018   LAPAROTOMY N/A 11/25/2019  Procedure: Sela Hilding WITH OPEN PELVIC WASHOUT;  Surgeon: Leighton Ruff, MD;  Location: [facility];  Service: General;  Laterality: N/A;   PORT-A-CATH REMOVAL N/A 10/15/2019   Procedure: PORT REMOVAL;  Surgeon: Leighton Ruff, MD;  [facility];  Service: General;  Laterality: N/A;   PORTACATH PLACEMENT Left 02/21/2019   Procedure: INSERTION PORT-A-CATH;  Surgeon: Johnathan Hausen, MD;  Location:  [facility];  Service: General;  Laterality: Left;   WISDOM TOOTH EXTRACTION     XI ROBOTIC ASSISTED LOWER ANTERIOR RESECTION N/A 10/15/2019   Procedure: XI ROBOTIC ASSISTED LOWER ANTERIOR RESECTION, RIGID PROCTOSCOPY;  Surgeon: Leighton Ruff, MD;  [facility];  Service: General;  Laterality: N/A;   XI ROBOTIC ASSISTED LOWER ANTERIOR RESECTION N/A 11/19/2020   Procedure: XI ROBOTIC ASSISTED REDO OF LOWER ANTERIOR RESECTION WITH ROBOTIC LYSIS OF ADHESIONS;  Surgeon: Leighton Ruff, MD;  [facility];  Service: General;  Laterality: N/A;       Family History  Problem Relation Age of Onset   Heart attack Father    Cancer Paternal Grandmother    Cancer Paternal Grandfather     Social History   Tobacco Use   Smoking status: Former    Pack years: 0.00    Types: Cigarettes    Quit date: 2005    Years since quitting: 17.4   Smokeless tobacco: Never   Tobacco comments:    quit over 10 years ago  Vaping Use   Vaping Use: Never used  Substance Use Topics   Alcohol use: Yes    Comment: 3-4 beers weekly   Drug use: No    Home Medications Prior to Admission medications   Medication Sig Start Date End Date Taking? Authorizing Provider  acetaminophen (TYLENOL) 500 MG tablet Take 1,000 mg by mouth every 6 (six) hours as needed for moderate pain or headache.    [provider]  loperamide (IMODIUM) 2 MG capsule Take 1 capsule (2 mg total) by mouth daily before lunch. 2/84/13   Leighton Ruff, MD  oxyCODONE (OXY IR/ROXICODONE) 5 MG immediate release tablet Take 1-2 tablets (5-10 mg total) by mouth every 6 (six) hours as needed for moderate pain, severe pain or breakthrough pain. 2/44/01   Leighton Ruff, MD    Allergies    Patient has no known allergies.  Review of Systems   Review of Systems  All other systems reviewed and are negative.  Physical Exam Updated Vital Signs BP (!) 148/108 (BP Location: Left Arm)   Pulse (!) 101   Temp 99.5 F (37.5 C)   Resp 17   SpO2  100%   Physical Exam Vitals and nursing note reviewed.  Constitutional:      Appearance: He is well-developed.     Comments: Appears uncomfortable  HENT:     Head: Atraumatic.  Eyes:     Conjunctiva/sclera: Conjunctivae normal.  Cardiovascular:     Rate and Rhythm: Tachycardia present.     Heart sounds: Normal heart sounds.  Pulmonary:     Effort: Pulmonary effort is normal.     Breath sounds: Normal breath sounds.  Abdominal:     Palpations: Abdomen is soft.     Tenderness: There is abdominal tenderness (Exquisite tenderness to palpation of the abdomen with guarding noted.  Ostomy bag with normal stoma, some leakage surrounding JP drain noted).  Musculoskeletal:     Cervical back: Neck supple.  Skin:    Findings: No rash.  Neurological:     Mental Status: He is  alert.  Psychiatric:        Mood and Affect: Mood normal.    ED Results / Procedures / Treatments   Labs (all labs ordered are listed, but only abnormal results are displayed) Labs Reviewed  CBC WITH DIFFERENTIAL/PLATELET  COMPREHENSIVE METABOLIC PANEL  LIPASE, BLOOD  URINALYSIS, ROUTINE W REFLEX MICROSCOPIC    EKG None  Radiology No results found.  Procedures Procedures   Medications Ordered in ED Medications  HYDROmorphone (DILAUDID) injection 1 mg (1 mg Intravenous Given 11/23/20 1443)  sodium chloride 0.9 % bolus 1,000 mL (1,000 mLs Intravenous New Bag/Given 11/23/20 1442)  ondansetron (ZOFRAN) injection 4 mg (4 mg Intravenous Given 11/23/20 1443)    ED Course  I have reviewed the triage vital signs and the nursing notes.  Pertinent labs & imaging results that were available during my care of the patient were reviewed by me and considered in my medical decision making (see chart for details).    MDM Rules/Calculators/A&P                          BP (!) 148/108 (BP Location: Left Arm)   Pulse (!) 101   Temp 99.5 F (37.5 C)   Resp 17   SpO2 100%   Final Clinical Impression(s) / ED  Diagnoses Final diagnoses:  None    Rx / DC Orders ED Discharge Orders     None      2:34 PM Patient with history of colon cancer who has had anastomotic stricture requiring robotic lysis of adlesionIn the past.  Was recently admitted for same and had robotic lysis of adhesion done by Dr. Henri Medal.  Patient was discharged yesterday and was doing fine until today when he did reported cute onset of diffuse abdominal discomfort with associate nausea or vomiting.  He voiced concern for perforation or complication of the surgery as he has experienced in the past.  Plan to obtain abdominal pelvis CT scan for further aeration.  Will provide antinausea medication, opiate medication as well as IV fluid.  2:43 PM Pt sign out to oncoming team who will f/u on CT result, labs and will likely consult general surgery for further management. I'm concern for anastomotic breakdown of recent adhesion lysis.    Domenic Moras, PA-C 11/23/20 1508    Daleen Bo, MD 11/25/20 [phone]

## 2020-11-23 NOTE — ED Triage Notes (Signed)
Pt complains of abdominal pain/n/v since abdominal surgery yesterday. He also reports fluid draining around the drain site.

## 2020-11-23 NOTE — ED Notes (Signed)
Patient made aware we need a urine sample. He said he is unable to go at this time. Urinal at bedside.

## 2020-11-23 NOTE — Progress Notes (Signed)
Subjective/Chief Complaint: Patient was doing well yesterday until about 3am this morning. Awoke with severe abdominal pain which is most prominent in the lower abdomen but is diffuse. He also endorses very little ostomy output today, whereas normally  to empty the bag 10 times per day. Endorses nausea and several episodes of emesis.    Objective: Vital signs in last 24 hours: Temp:  [98.1 F (36.7 C)-99.5 F (37.5 C)] 98.1 F (36.7 C) (06/14 1536) Pulse Rate:  [77-101] 86 (06/14 1700) Resp:  [17] 17 (06/14 1700) BP: (129-148)/(89-108) 138/94 (06/14 1700) SpO2:  [94 %-100 %] 94 % (06/14 1700)    Intake/Output from previous day: No intake/output data recorded. Intake/Output this shift: No intake/output data recorded.  General appearance: alert, cooperative, and appears uncomfortable Resp: unlabored Cardio: regular rate and rhythm GI: distended, diffusely tender. Incisions are c/d/I. RLQ ostomy is pink everted with pudding-consistency stool about 200cc and some air in the bag. JP output is serous to serosanguinous. Extremities: extremities normal, atraumatic, no cyanosis or edema Skin: Skin color, texture, turgor normal. No rashes or lesions Neurologic: Grossly normal Incision/Wound: dermabond  Lab Results:  Recent Labs    11/22/20 0417 11/23/20 1440  WBC 5.1 11.3*  HGB 12.0* 14.9  HCT 36.9* 44.1  PLT 170 287   BMET Recent Labs    11/22/20 0417 11/23/20 1440  NA 138 140  K 4.2 3.6  CL 106 104  CO2 28 28  GLUCOSE 90 114*  BUN 14 12  CREATININE 0.83 0.89  CALCIUM 8.4* 9.3   PT/INR No results for input(s): LABPROT, INR in the last 72 hours. ABG No results for input(s): PHART, HCO3 in the last 72 hours.  Invalid input(s): PCO2, PO2  Studies/Results: CT ABDOMEN PELVIS WO CONTRAST  Result Date: 11/23/2020 CLINICAL DATA:  Acute abdominal pain, recent abdominal surgery, worsening pain, pain around drain site as well as in mid and lower abdomen. Post robotic  assisted low anterior resection on 11/19/2020 for rectal adenocarcinoma EXAM: CT ABDOMEN AND PELVIS WITHOUT CONTRAST TECHNIQUE: Multidetector CT imaging of the abdomen and pelvis was performed following the standard protocol without IV contrast. Sagittal and coronal MPR images reconstructed from axial data set. No oral contrast administered. COMPARISON:  08/18/2020 FINDINGS: Lower chest: Subsegmental atelectasis BILATERAL lower lobes and at base of RIGHT middle lobe. Hepatobiliary: Gallbladder and liver normal appearance Pancreas: Normal appearance Spleen: Normal appearance Adrenals/Urinary Tract: Adrenal glands, kidneys, ureters, and bladder normal appearance Stomach/Bowel: Normal appendix. Stomach normal appearance. Diverting loop ileostomy in the RIGHT mid abdomen unchanged. Colon decompressed throughout its length. No abnormal fluid collections adjacent to colorectal anastomosis. Multiple dilated small bowel loops are seen up to 4.5 cm diameter with gradual transition from dilated to nondilated small bowel in the RIGHT pelvis, likely postoperative ileus. Minimal bowel wall thickening of some of the small bowel loops, which may be related to surgery. Infiltration of associated mesentery. No evidence of perforation. Vascular/Lymphatic: Aorta normal caliber.  No adenopathy. Reproductive: Unremarkable. Other: No free air or free fluid. No hernia or focal fluid collection. Stranding in presacral space consistent with preceding surgery. Surgical drain in pelvis. Musculoskeletal: Unremarkable IMPRESSION: Dilated small bowel loops up to 4.5 cm diameter with gradual transition from dilated to nondilated small bowel in the RIGHT pelvis, favor ileus in the postoperative period although obstruction is not entirely excluded. No evidence of perforation or abscess. Bibasilar subsegmental atelectasis. Electronically Signed   By: Lavonia Dana M.D.   On: 11/23/2020 16:16    Anti-infectives:  Anti-infectives (From admission,  onward)    None       Assessment/Plan:  POD 4 s/p redo LAR with adhesiolysis returning 1 day after discharge for ileus and abdominal pain. CT as above without evidence of leak or perforation. Labs appear hemoconcentrated.   Admit to surgical floor NPO except ice chips, sips with meds.  NG tube if emesis this evening, worsening nausea or worsening distention/pain- to be patient-directed this evening IV fluid resus Pain and nausea control Repeat labs in AM.    LOS: 0 days    Clovis Riley 11/23/2020

## 2020-11-23 NOTE — ED Notes (Signed)
ED TO INPATIENT HANDOFF REPORT  Name/Age/Gender Brandon Barry 45 y.o. male  Code Status    Code Status Orders  (From admission, onward)         Start     Ordered   11/23/20 1724  Full code  Continuous        11/23/20 1730        Code Status History    Date Active Date Inactive Code Status Order ID Comments User Context   11/19/2020 1516 11/22/2020 1444 Full Code [id number]  Leighton Ruff, MD Inpatient   10/31/2019 1720 12/03/2019 1532 Full Code [id number]  Ophelia Shoulder Inpatient   10/15/2019 1752 10/21/2019 1555 Full Code [id number]  Leighton Ruff, MD Inpatient      Home/SNF/Other Home  Chief Complaint Ileus [facility]) [K56.7]  Level of Care/Admitting Diagnosis ED Disposition    ED Disposition  Admit   Condition  --   Comment  Hospital Area: [facility] [[id number]]  Level of Care: Med-Surg [16]  May place patient in observation at [facility] or [place] if equivalent level of care is available:: No  Covid Evaluation: Asymptomatic Screening Protocol (No Symptoms)  Diagnosis: Ileus [facility]) [[id number]]  Admitting Physician: Leighton Ruff [[id number]]  Attending Physician: Leighton Ruff [[id number]]  Bed request comments: 3E         Medical History Past Medical History:  Diagnosis Date  . Hyperlipidemia   . OSA on CPAP   . Peripheral neuropathy    Hands and feet from Chemo  . Rectal adenocarcinoma ([place])   . Right shoulder pain     Allergies No Known Allergies  IV Location/Drains/Wounds Patient Lines/Drains/Airways Status    Active Line/Drains/Airways    Name Placement date Placement time Site Days   Peripheral IV 11/23/20 20 G 1" Anterior;Distal;Left;Upper Arm 11/23/20  1442  Arm  less than 1   Closed System Drain Right Bulb (JP) 19 Fr. 11/19/20  1158  --  4   Ileostomy RUQ 11/25/19  1508  RUQ  364   Incision (Closed) 10/15/19 Abdomen Other (Comment) 10/15/19  1534  -- 405   Incision (Closed) 11/25/19 Abdomen Other (Comment) 11/25/19  1509  --  364   Incision (Closed) 11/25/19 Abdomen 11/25/19  1529  -- 364   Incision - 5 Ports Abdomen Left;Mid Right;Medial;Upper Right;Medial;Mid Right;Lateral Right;Lower;Lateral 10/15/19  1325  -- 405   Incision - 6 Ports Abdomen Right;Upper Right Right;Lower Right;Umbilicus Left Left;Lateral 11/19/20  1134  -- 4   Wound / Incision (Open or Dehisced) 10/31/19 Non-pressure wound;Other (Comment) Abdomen Right;Lower abscess draining  10/31/19  1825  Abdomen  389          Labs/Imaging Results for orders placed or performed during the hospital encounter of 11/23/20 (from the past 48 hour(s))  CBC with Differential     Status: Abnormal   Collection Time: 11/23/20  2:40 PM  Result Value Ref Range   WBC 11.3 (H) 4.0 - 10.5 K/uL   RBC 4.80 4.22 - 5.81 MIL/uL   Hemoglobin 14.9 13.0 - 17.0 g/dL   HCT 44.1 39.0 - 52.0 %   MCV 91.9 80.0 - 100.0 fL   MCH 31.0 26.0 - 34.0 pg   MCHC 33.8 30.0 - 36.0 g/dL   RDW 13.4 11.5 - 15.5 %   Platelets 287 150 - 400 K/uL   nRBC 0.0 0.0 - 0.2 %   Neutrophils Relative % 91 %   Neutro Abs 10.2 (H) 1.7 - 7.7 K/uL  Lymphocytes Relative 4 %   Lymphs Abs 0.5 (L) 0.7 - 4.0 K/uL   Monocytes Relative 5 %   Monocytes Absolute 0.6 0.1 - 1.0 K/uL   Eosinophils Relative 0 %   Eosinophils Absolute 0.0 0.0 - 0.5 K/uL   Basophils Relative 0 %   Basophils Absolute 0.0 0.0 - 0.1 K/uL   Immature Granulocytes 0 %   Abs Immature Granulocytes 0.04 0.00 - 0.07 K/uL    Comment: Performed at [facility], [place] [address] [place], [place] [address]  Comprehensive metabolic panel     Status: Abnormal   Collection Time: 11/23/20  2:40 PM  Result Value Ref Range   Sodium 140 135 - 145 mmol/L   Potassium 3.6 3.5 - 5.1 mmol/L   Chloride 104 98 - 111 mmol/L   CO2 28 22 - 32 mmol/L   Glucose, Bld 114 (H) 70 - 99 mg/dL    Comment: Glucose reference range applies only to samples taken after fasting for at least 8 hours.   BUN 12 6 - 20 mg/dL   Creatinine, Ser 0.89  0.61 - 1.24 mg/dL   Calcium 9.3 8.9 - 10.3 mg/dL   Total Protein 7.4 6.5 - 8.1 g/dL   Albumin 3.7 3.5 - 5.0 g/dL   AST 56 (H) 15 - 41 U/L   ALT 161 (H) 0 - 44 U/L   Alkaline Phosphatase 169 (H) 38 - 126 U/L   Total Bilirubin 0.9 0.3 - 1.2 mg/dL   GFR, Estimated >60 >60 mL/min    Comment: (NOTE) Calculated using the CKD-EPI Creatinine Equation (2021)    Anion gap 8 5 - 15    Comment: Performed at [facility], [place] [address] [place], [place] [address]  Lipase, blood     Status: None   Collection Time: 11/23/20  2:40 PM  Result Value Ref Range   Lipase 22 11 - 51 U/L    Comment: Performed at [facility], [place] [address], [place],  [address]   CT ABDOMEN PELVIS WO CONTRAST  Result Date: 11/23/2020 CLINICAL DATA:  Acute abdominal pain, recent abdominal surgery, worsening pain, pain around drain site as well as in mid and lower abdomen. Post robotic assisted low anterior resection on 11/19/2020 for rectal adenocarcinoma EXAM: CT ABDOMEN AND PELVIS WITHOUT CONTRAST TECHNIQUE: Multidetector CT imaging of the abdomen and pelvis was performed following the standard protocol without IV contrast. Sagittal and coronal MPR images reconstructed from axial data set. No oral contrast administered. COMPARISON:  08/18/2020 FINDINGS: Lower chest: Subsegmental atelectasis BILATERAL lower lobes and at base of RIGHT middle lobe. Hepatobiliary: Gallbladder and liver normal appearance Pancreas: Normal appearance Spleen: Normal appearance Adrenals/Urinary Tract: Adrenal glands, kidneys, ureters, and bladder normal appearance Stomach/Bowel: Normal appendix. Stomach normal appearance. Diverting loop ileostomy in the RIGHT mid abdomen unchanged. Colon decompressed throughout its length. No abnormal fluid collections adjacent to colorectal anastomosis. Multiple dilated small bowel loops are seen up to 4.5 cm diameter with gradual transition from dilated to nondilated small  bowel in the RIGHT pelvis, likely postoperative ileus. Minimal bowel wall thickening of some of the small bowel loops, which may be related to surgery. Infiltration of associated mesentery. No evidence of perforation. Vascular/Lymphatic: Aorta normal caliber.  No adenopathy. Reproductive: Unremarkable. Other: No free air or free fluid. No hernia or focal fluid collection. Stranding in presacral space consistent with preceding surgery. Surgical drain in pelvis. Musculoskeletal: Unremarkable IMPRESSION: Dilated small bowel loops up to 4.5 cm diameter  with gradual transition from dilated to nondilated small bowel in the RIGHT pelvis, favor ileus in the postoperative period although obstruction is not entirely excluded. No evidence of perforation or abscess. Bibasilar subsegmental atelectasis. Electronically Signed   By: Lavonia Dana M.D.   On: 11/23/2020 16:16    Pending Labs Unresulted Labs (From admission, onward)    Start     Ordered   11/24/20 0500  Comprehensive metabolic panel  Tomorrow morning,   R        11/23/20 1730   11/24/20 0500  Magnesium  Tomorrow morning,   R        11/23/20 1730   11/24/20 0500  CBC  Tomorrow morning,   R        11/23/20 1730   11/23/20 1905  SARS CORONAVIRUS 2 (TAT 6-24 HRS) Nasopharyngeal Nasopharyngeal Swab  (Tier 3 - Symptomatic/asymptomatic)  Once,   STAT       Question Answer Comment  Is this test for diagnosis or screening Screening   Symptomatic for COVID-19 as defined by CDC No   Hospitalized for COVID-19 No   Admitted to ICU for COVID-19 No   Previously tested for COVID-19 Yes   Resident in a congregate (group) care setting No   Employed in healthcare setting No   Has patient completed COVID vaccination(s) (2 doses of Pfizer/Moderna 1 dose of [employer]) Unknown      11/23/20 1904   11/23/20 1724  HIV Antibody (routine testing w rflx)  (HIV Antibody (Routine testing w reflex) panel)  Once,   STAT        11/23/20 1730   11/23/20 1350   Urinalysis, Routine w reflex microscopic  (ED Abdominal Pain)  ONCE - STAT,   STAT        11/23/20 1349          Vitals/Pain Today's Vitals   11/23/20 1630 11/23/20 1700 11/23/20 1800 11/23/20 1935  BP: (!) 129/91 (!) 138/94 123/81   Pulse: 80 86 71   Resp: 17 17 17    Temp:      TempSrc:      SpO2: 95% 94% 96%   PainSc:    4     Isolation Precautions No active isolations  Medications Medications  enoxaparin (LOVENOX) injection 40 mg (has no administration in time range)  dextrose 5 % and 0.45 % NaCl with KCl 20 mEq/L infusion ( Intravenous New Bag/Given 11/23/20 1904)  metoprolol tartrate (LOPRESSOR) injection 5 mg (has no administration in time range)  hydrALAZINE (APRESOLINE) injection 10 mg (has no administration in time range)  simethicone (MYLICON) chewable tablet 40 mg (has no administration in time range)  ondansetron (ZOFRAN-ODT) disintegrating tablet 4 mg (has no administration in time range)    Or  ondansetron (ZOFRAN) injection 4 mg (has no administration in time range)  prochlorperazine (COMPAZINE) tablet 10 mg (has no administration in time range)    Or  prochlorperazine (COMPAZINE) injection 5-10 mg (has no administration in time range)  diphenhydrAMINE (BENADRYL) capsule 25 mg (has no administration in time range)    Or  diphenhydrAMINE (BENADRYL) injection 25 mg (has no administration in time range)  HYDROmorphone (DILAUDID) injection 1-2 mg (has no administration in time range)  acetaminophen (OFIRMEV) IV 1,000 mg (1,000 mg Intravenous New Bag/Given 11/23/20 1903)  HYDROmorphone (DILAUDID) injection 1 mg (1 mg Intravenous Given 11/23/20 1443)  sodium chloride 0.9 % bolus 1,000 mL (0 mLs Intravenous Stopped 11/23/20 1633)  ondansetron (ZOFRAN) injection 4 mg (  4 mg Intravenous Given 11/23/20 1443)  sodium chloride 0.9 % bolus 1,000 mL (0 mLs Intravenous Stopped 11/23/20 1821)  HYDROmorphone (DILAUDID) injection 1 mg (1 mg Intravenous Given 11/23/20 1713)   HYDROmorphone (DILAUDID) injection 1 mg (1 mg Intravenous Given 11/23/20 1829)    Mobility walks

## 2020-11-24 DIAGNOSIS — K9189 Other postprocedural complications and disorders of digestive system: Secondary | ICD-10-CM | POA: Diagnosis present

## 2020-11-24 DIAGNOSIS — Z87891 Personal history of nicotine dependence: Secondary | ICD-10-CM | POA: Diagnosis not present

## 2020-11-24 DIAGNOSIS — F411 Generalized anxiety disorder: Secondary | ICD-10-CM | POA: Diagnosis present

## 2020-11-24 DIAGNOSIS — Z79899 Other long term (current) drug therapy: Secondary | ICD-10-CM | POA: Diagnosis not present

## 2020-11-24 DIAGNOSIS — Y838 Other surgical procedures as the cause of abnormal reaction of the patient, or of later complication, without mention of misadventure at the time of the procedure: Secondary | ICD-10-CM | POA: Diagnosis present

## 2020-11-24 DIAGNOSIS — Z85048 Personal history of other malignant neoplasm of rectum, rectosigmoid junction, and anus: Secondary | ICD-10-CM | POA: Diagnosis not present

## 2020-11-24 DIAGNOSIS — E785 Hyperlipidemia, unspecified: Secondary | ICD-10-CM | POA: Diagnosis present

## 2020-11-24 DIAGNOSIS — K567 Ileus, unspecified: Secondary | ICD-10-CM | POA: Diagnosis present

## 2020-11-24 DIAGNOSIS — Z20822 Contact with and (suspected) exposure to covid-19: Secondary | ICD-10-CM | POA: Diagnosis present

## 2020-11-24 DIAGNOSIS — G629 Polyneuropathy, unspecified: Secondary | ICD-10-CM | POA: Diagnosis present

## 2020-11-24 DIAGNOSIS — J309 Allergic rhinitis, unspecified: Secondary | ICD-10-CM | POA: Diagnosis present

## 2020-11-24 DIAGNOSIS — Z8249 Family history of ischemic heart disease and other diseases of the circulatory system: Secondary | ICD-10-CM | POA: Diagnosis not present

## 2020-11-24 DIAGNOSIS — G4733 Obstructive sleep apnea (adult) (pediatric): Secondary | ICD-10-CM | POA: Diagnosis present

## 2020-11-24 LAB — COMPREHENSIVE METABOLIC PANEL
ALT: 109 U/L — ABNORMAL HIGH (ref 0–44)
AST: 35 U/L (ref 15–41)
Albumin: 2.9 g/dL — ABNORMAL LOW (ref 3.5–5.0)
Alkaline Phosphatase: 113 U/L (ref 38–126)
Anion gap: 5 (ref 5–15)
BUN: 9 mg/dL (ref 6–20)
CO2: 25 mmol/L (ref 22–32)
Calcium: 8.4 mg/dL — ABNORMAL LOW (ref 8.9–10.3)
Chloride: 107 mmol/L (ref 98–111)
Creatinine, Ser: 0.93 mg/dL (ref 0.61–1.24)
GFR, Estimated: >60 mL/min (ref >60)
Glucose, Bld: 122 mg/dL — ABNORMAL HIGH (ref 70–99)
Potassium: 4.5 mmol/L (ref 3.5–5.1)
Sodium: 137 mmol/L (ref 135–145)
Total Bilirubin: 0.7 mg/dL (ref 0.3–1.2)
Total Protein: 5.7 g/dL — ABNORMAL LOW (ref 6.5–8.1)

## 2020-11-24 LAB — HIV ANTIBODY (ROUTINE TESTING W REFLEX): HIV Screen 4th Generation wRfx: NONREACTIVE

## 2020-11-24 LAB — CBC
HCT: 37.4 % — ABNORMAL LOW (ref 39.0–52.0)
Hemoglobin: 12.3 g/dL — ABNORMAL LOW (ref 13.0–17.0)
MCH: 30.8 pg (ref 26.0–34.0)
MCHC: 32.9 g/dL (ref 30.0–36.0)
MCV: 93.7 fL (ref 80.0–100.0)
Platelets: 228 10*3/uL (ref 150–400)
RBC: 3.99 MIL/uL — ABNORMAL LOW (ref 4.22–5.81)
RDW: 13.5 % (ref 11.5–15.5)
WBC: 5.2 10*3/uL (ref 4.0–10.5)
nRBC: 0 % (ref 0.0–0.2)

## 2020-11-24 LAB — SARS CORONAVIRUS 2 (TAT 6-24 HRS): SARS Coronavirus 2: NEGATIVE

## 2020-11-24 LAB — MAGNESIUM: Magnesium: 1.9 mg/dL (ref 1.7–2.4)

## 2020-11-24 NOTE — Plan of Care (Signed)
  Problem: Education: Goal: Knowledge of General Education information will improve Description: Including pain rating scale, medication(s)/side effects and non-pharmacologic comfort measures Outcome: Progressing   Problem: Pain Managment: Goal: General experience of comfort will improve Outcome: Progressing   Problem: Skin Integrity: Goal: Risk for impaired skin integrity will decrease Outcome: Progressing   

## 2020-11-24 NOTE — H&P (Deleted)
Subjective: Readmitted for ileus, having some mid abd pain  Objective: Vital signs in last 24 hours: Temp:  [97.9 F (36.6 C)-99.5 F (37.5 C)] 98.3 F (36.8 C) (06/15 0512) Pulse Rate:  [66-101] 70 (06/15 0512) Resp:  [16-19] 19 (06/15 0512) BP: (103-148)/(71-108) 125/90 (06/15 0512) SpO2:  [94 %-100 %] 100 % (06/15 0512) Weight:  [78.9 kg] 78.9 kg (06/15 0700)   Intake/Output from previous day: 06/14 0701 - 06/15 0700 In: 1622.9 [P.O.:180; I.V.:1242.9; IV Piggyback:200] Out: 25 [Drains:25] Intake/Output this shift: No intake/output data recorded.   General appearance: alert and cooperative GI: soft, nondistended  Incision: healing well  Lab Results:  Recent Labs    11/23/20 1440 11/24/20 0653  WBC 11.3* 5.2  HGB 14.9 12.3*  HCT 44.1 37.4*  PLT 287 228   BMET Recent Labs    11/23/20 1440 11/24/20 0454  NA 140 137  K 3.6 4.5  CL 104 107  CO2 28 25  GLUCOSE 114* 122*  BUN 12 9  CREATININE 0.89 0.93  CALCIUM 9.3 8.4*   PT/INR No results for input(s): LABPROT, INR in the last 72 hours. ABG No results for input(s): PHART, HCO3 in the last 72 hours.  Invalid input(s): PCO2, PO2  MEDS, Scheduled  enoxaparin (LOVENOX) injection  40 mg Subcutaneous Q24H    Studies/Results: CT ABDOMEN PELVIS WO CONTRAST  Result Date: 11/23/2020 CLINICAL DATA:  Acute abdominal pain, recent abdominal surgery, worsening pain, pain around drain site as well as in mid and lower abdomen. Post robotic assisted low anterior resection on 11/19/2020 for rectal adenocarcinoma EXAM: CT ABDOMEN AND PELVIS WITHOUT CONTRAST TECHNIQUE: Multidetector CT imaging of the abdomen and pelvis was performed following the standard protocol without IV contrast. Sagittal and coronal MPR images reconstructed from axial data set. No oral contrast administered. COMPARISON:  08/18/2020 FINDINGS: Lower chest: Subsegmental atelectasis BILATERAL lower lobes and at base of RIGHT middle lobe. Hepatobiliary:  Gallbladder and liver normal appearance Pancreas: Normal appearance Spleen: Normal appearance Adrenals/Urinary Tract: Adrenal glands, kidneys, ureters, and bladder normal appearance Stomach/Bowel: Normal appendix. Stomach normal appearance. Diverting loop ileostomy in the RIGHT mid abdomen unchanged. Colon decompressed throughout its length. No abnormal fluid collections adjacent to colorectal anastomosis. Multiple dilated small bowel loops are seen up to 4.5 cm diameter with gradual transition from dilated to nondilated small bowel in the RIGHT pelvis, likely postoperative ileus. Minimal bowel wall thickening of some of the small bowel loops, which may be related to surgery. Infiltration of associated mesentery. No evidence of perforation. Vascular/Lymphatic: Aorta normal caliber.  No adenopathy. Reproductive: Unremarkable. Other: No free air or free fluid. No hernia or focal fluid collection. Stranding in presacral space consistent with preceding surgery. Surgical drain in pelvis. Musculoskeletal: Unremarkable IMPRESSION: Dilated small bowel loops up to 4.5 cm diameter with gradual transition from dilated to nondilated small bowel in the RIGHT pelvis, favor ileus in the postoperative period although obstruction is not entirely excluded. No evidence of perforation or abscess. Bibasilar subsegmental atelectasis. Electronically Signed   By: Lavonia Dana M.D.   On: 11/23/2020 16:16    Assessment: s/p  Patient Active Problem List   Diagnosis Date Noted   Ileus ([place]) 11/23/2020   Anastomotic stricture of colorectal region 11/19/2020   Medication management 10/12/2020   OSA on CPAP 11/01/2019   Peripheral neuropathy    Hyperlipidemia    Chronic back pain greater than 3 months duration    Right shoulder pain    Intraabdominal abscess 10/31/2019   Hand foot  syndrome w chemotherapy 04/04/2019   Port-A-Cath in place 03/27/2019   Cancer of rectum  ypT3, ypN0 s/p TNT & robotic LAR 10/15/2019 02/19/2019   Pain in  left knee 07/22/2018   Anxiety state 03/18/2007   DEPRESSION 03/18/2007   Allergic rhinitis 03/18/2007    Post op ileus  Plan: NPO, IVFs for now.  Await return of bowel function   LOS: 0 days     .Rosario Adie, MD [facility], [place]    11/24/2020 9:11 AM

## 2020-11-25 NOTE — H&P (Addendum)
Subjective: Readmitted for ileus, having some mid abd pain  Objective: Vital signs in last 24 hours: Temp:  [98.1 F (36.7 C)-99 F (37.2 C)] 98.8 F (37.1 C) (06/16 0559) Pulse Rate:  [55-69] 68 (06/16 0559) Resp:  [17-18] 17 (06/16 0559) BP: (126-143)/(87-92) 126/87 (06/16 0559) SpO2:  [96 %-98 %] 98 % (06/16 0559)   Intake/Output from previous day: 06/15 0701 - 06/16 0700 In: 2576.4 [I.V.:2376.4; IV Piggyback:200] Out: 3085 [Urine:2325; Drains:45; Stool:715] Intake/Output this shift: Total I/O In: -  Out: 5 [Drains:5]   General appearance: alert and cooperative GI: soft, nondistended  Incision: healing well  Lab Results:  Recent Labs    11/23/20 1440 11/24/20 0653  WBC 11.3* 5.2  HGB 14.9 12.3*  HCT 44.1 37.4*  PLT 287 228    BMET Recent Labs    11/23/20 1440 11/24/20 0454  NA 140 137  K 3.6 4.5  CL 104 107  CO2 28 25  GLUCOSE 114* 122*  BUN 12 9  CREATININE 0.89 0.93  CALCIUM 9.3 8.4*    PT/INR No results for input(s): LABPROT, INR in the last 72 hours. ABG No results for input(s): PHART, HCO3 in the last 72 hours.  Invalid input(s): PCO2, PO2  MEDS, Scheduled  enoxaparin (LOVENOX) injection  40 mg Subcutaneous Q24H    Studies/Results: CT ABDOMEN PELVIS WO CONTRAST  Result Date: 11/23/2020 CLINICAL DATA:  Acute abdominal pain, recent abdominal surgery, worsening pain, pain around drain site as well as in mid and lower abdomen. Post robotic assisted low anterior resection on 11/19/2020 for rectal adenocarcinoma EXAM: CT ABDOMEN AND PELVIS WITHOUT CONTRAST TECHNIQUE: Multidetector CT imaging of the abdomen and pelvis was performed following the standard protocol without IV contrast. Sagittal and coronal MPR images reconstructed from axial data set. No oral contrast administered. COMPARISON:  08/18/2020 FINDINGS: Lower chest: Subsegmental atelectasis BILATERAL lower lobes and at base of RIGHT middle lobe. Hepatobiliary: Gallbladder and liver  normal appearance Pancreas: Normal appearance Spleen: Normal appearance Adrenals/Urinary Tract: Adrenal glands, kidneys, ureters, and bladder normal appearance Stomach/Bowel: Normal appendix. Stomach normal appearance. Diverting loop ileostomy in the RIGHT mid abdomen unchanged. Colon decompressed throughout its length. No abnormal fluid collections adjacent to colorectal anastomosis. Multiple dilated small bowel loops are seen up to 4.5 cm diameter with gradual transition from dilated to nondilated small bowel in the RIGHT pelvis, likely postoperative ileus. Minimal bowel wall thickening of some of the small bowel loops, which may be related to surgery. Infiltration of associated mesentery. No evidence of perforation. Vascular/Lymphatic: Aorta normal caliber.  No adenopathy. Reproductive: Unremarkable. Other: No free air or free fluid. No hernia or focal fluid collection. Stranding in presacral space consistent with preceding surgery. Surgical drain in pelvis. Musculoskeletal: Unremarkable IMPRESSION: Dilated small bowel loops up to 4.5 cm diameter with gradual transition from dilated to nondilated small bowel in the RIGHT pelvis, favor ileus in the postoperative period although obstruction is not entirely excluded. No evidence of perforation or abscess. Bibasilar subsegmental atelectasis. Electronically Signed   By: Lavonia Dana M.D.   On: 11/23/2020 16:16    Assessment: s/p  Patient Active Problem List   Diagnosis Date Noted   Ileus ([place]) 11/23/2020   Anastomotic stricture of colorectal region 11/19/2020   Medication management 10/12/2020   OSA on CPAP 11/01/2019   Peripheral neuropathy    Hyperlipidemia    Chronic back pain greater than 3 months duration    Right shoulder pain    Intraabdominal abscess 10/31/2019   Hand foot syndrome  w chemotherapy 04/04/2019   Port-A-Cath in place 03/27/2019   Cancer of rectum  ypT3, ypN0 s/p TNT & robotic LAR 10/15/2019 02/19/2019   Pain in left knee 07/22/2018    Anxiety state 03/18/2007   DEPRESSION 03/18/2007   Allergic rhinitis 03/18/2007    Post op ileus  Plan: NPO, IVFs for now.  Await return of bowel function   LOS: 1 day     .Rosario Adie, MD [facility] Surgery, [place]    11/25/2020 11:13 AM

## 2020-11-25 NOTE — Progress Notes (Signed)
Subjective: Having ostomy output, having less abd pain and nausea  Objective: Vital signs in last 24 hours: Temp:  [98.1 F (36.7 C)-99 F (37.2 C)] 98.8 F (37.1 C) (06/16 0559) Pulse Rate:  [55-69] 68 (06/16 0559) Resp:  [17-18] 17 (06/16 0559) BP: (126-143)/(87-92) 126/87 (06/16 0559) SpO2:  [96 %-98 %] 98 % (06/16 0559)   Intake/Output from previous day: 06/15 0701 - 06/16 0700 In: 2576.4 [I.V.:2376.4; IV Piggyback:200] Out: 3085 [Urine:2325; Drains:45; Stool:715] Intake/Output this shift: Total I/O In: -  Out: 5 [Drains:5]   General appearance: alert and cooperative GI: soft, nondistended JP: SS fluid Incision: healing well  Lab Results:  Recent Labs    11/23/20 1440 11/24/20 0653  WBC 11.3* 5.2  HGB 14.9 12.3*  HCT 44.1 37.4*  PLT 287 228    BMET Recent Labs    11/23/20 1440 11/24/20 0454  NA 140 137  K 3.6 4.5  CL 104 107  CO2 28 25  GLUCOSE 114* 122*  BUN 12 9  CREATININE 0.89 0.93  CALCIUM 9.3 8.4*    PT/INR No results for input(s): LABPROT, INR in the last 72 hours. ABG No results for input(s): PHART, HCO3 in the last 72 hours.  Invalid input(s): PCO2, PO2  MEDS, Scheduled  enoxaparin (LOVENOX) injection  40 mg Subcutaneous Q24H    Studies/Results: CT ABDOMEN PELVIS WO CONTRAST  Result Date: 11/23/2020 CLINICAL DATA:  Acute abdominal pain, recent abdominal surgery, worsening pain, pain around drain site as well as in mid and lower abdomen. Post robotic assisted low anterior resection on 11/19/2020 for rectal adenocarcinoma EXAM: CT ABDOMEN AND PELVIS WITHOUT CONTRAST TECHNIQUE: Multidetector CT imaging of the abdomen and pelvis was performed following the standard protocol without IV contrast. Sagittal and coronal MPR images reconstructed from axial data set. No oral contrast administered. COMPARISON:  08/18/2020 FINDINGS: Lower chest: Subsegmental atelectasis BILATERAL lower lobes and at base of RIGHT middle lobe. Hepatobiliary:  Gallbladder and liver normal appearance Pancreas: Normal appearance Spleen: Normal appearance Adrenals/Urinary Tract: Adrenal glands, kidneys, ureters, and bladder normal appearance Stomach/Bowel: Normal appendix. Stomach normal appearance. Diverting loop ileostomy in the RIGHT mid abdomen unchanged. Colon decompressed throughout its length. No abnormal fluid collections adjacent to colorectal anastomosis. Multiple dilated small bowel loops are seen up to 4.5 cm diameter with gradual transition from dilated to nondilated small bowel in the RIGHT pelvis, likely postoperative ileus. Minimal bowel wall thickening of some of the small bowel loops, which may be related to surgery. Infiltration of associated mesentery. No evidence of perforation. Vascular/Lymphatic: Aorta normal caliber.  No adenopathy. Reproductive: Unremarkable. Other: No free air or free fluid. No hernia or focal fluid collection. Stranding in presacral space consistent with preceding surgery. Surgical drain in pelvis. Musculoskeletal: Unremarkable IMPRESSION: Dilated small bowel loops up to 4.5 cm diameter with gradual transition from dilated to nondilated small bowel in the RIGHT pelvis, favor ileus in the postoperative period although obstruction is not entirely excluded. No evidence of perforation or abscess. Bibasilar subsegmental atelectasis. Electronically Signed   By: Lavonia Dana M.D.   On: 11/23/2020 16:16    Assessment: s/p  Patient Active Problem List   Diagnosis Date Noted   Ileus ([place]) 11/23/2020   Anastomotic stricture of colorectal region 11/19/2020   Medication management 10/12/2020   OSA on CPAP 11/01/2019   Peripheral neuropathy    Hyperlipidemia    Chronic back pain greater than 3 months duration    Right shoulder pain    Intraabdominal abscess 10/31/2019  Hand foot syndrome w chemotherapy 04/04/2019   Port-A-Cath in place 03/27/2019   Cancer of rectum  ypT3, ypN0 s/p TNT & robotic LAR 10/15/2019 02/19/2019   Pain in  left knee 07/22/2018   Anxiety state 03/18/2007   DEPRESSION 03/18/2007   Allergic rhinitis 03/18/2007    Post op ileus, most likely due to expected post op ileus in conjunction with narcotic use  Plan: Will try limited clears today Ambulate in hall Limit narcotics Recheck labs in AM   LOS: 1 day     .Rosario Adie, MD [facility] Surgery, [place]    11/25/2020 11:14 AM

## 2020-11-25 NOTE — Plan of Care (Signed)
  Problem: Pain Managment: Goal: General experience of comfort will improve Outcome: Progressing   

## 2020-11-25 NOTE — H&P (Deleted)
Subjective: Having ostomy output, having less abd pain and nausea  Objective: Vital signs in last 24 hours: Temp:  [98.1 F (36.7 C)-99 F (37.2 C)] 98.8 F (37.1 C) (06/16 0559) Pulse Rate:  [55-69] 68 (06/16 0559) Resp:  [17-18] 17 (06/16 0559) BP: (126-143)/(87-92) 126/87 (06/16 0559) SpO2:  [96 %-98 %] 98 % (06/16 0559)   Intake/Output from previous day: 06/15 0701 - 06/16 0700 In: 2576.4 [I.V.:2376.4; IV Piggyback:200] Out: 3085 [Urine:2325; Drains:45; Stool:715] Intake/Output this shift: No intake/output data recorded.   General appearance: alert and cooperative GI: soft, nondistended JP: SS fluid Incision: healing well  Lab Results:  Recent Labs    11/23/20 1440 11/24/20 0653  WBC 11.3* 5.2  HGB 14.9 12.3*  HCT 44.1 37.4*  PLT 287 228    BMET Recent Labs    11/23/20 1440 11/24/20 0454  NA 140 137  K 3.6 4.5  CL 104 107  CO2 28 25  GLUCOSE 114* 122*  BUN 12 9  CREATININE 0.89 0.93  CALCIUM 9.3 8.4*    PT/INR No results for input(s): LABPROT, INR in the last 72 hours. ABG No results for input(s): PHART, HCO3 in the last 72 hours.  Invalid input(s): PCO2, PO2  MEDS, Scheduled  enoxaparin (LOVENOX) injection  40 mg Subcutaneous Q24H    Studies/Results: CT ABDOMEN PELVIS WO CONTRAST  Result Date: 11/23/2020 CLINICAL DATA:  Acute abdominal pain, recent abdominal surgery, worsening pain, pain around drain site as well as in mid and lower abdomen. Post robotic assisted low anterior resection on 11/19/2020 for rectal adenocarcinoma EXAM: CT ABDOMEN AND PELVIS WITHOUT CONTRAST TECHNIQUE: Multidetector CT imaging of the abdomen and pelvis was performed following the standard protocol without IV contrast. Sagittal and coronal MPR images reconstructed from axial data set. No oral contrast administered. COMPARISON:  08/18/2020 FINDINGS: Lower chest: Subsegmental atelectasis BILATERAL lower lobes and at base of RIGHT middle lobe. Hepatobiliary:  Gallbladder and liver normal appearance Pancreas: Normal appearance Spleen: Normal appearance Adrenals/Urinary Tract: Adrenal glands, kidneys, ureters, and bladder normal appearance Stomach/Bowel: Normal appendix. Stomach normal appearance. Diverting loop ileostomy in the RIGHT mid abdomen unchanged. Colon decompressed throughout its length. No abnormal fluid collections adjacent to colorectal anastomosis. Multiple dilated small bowel loops are seen up to 4.5 cm diameter with gradual transition from dilated to nondilated small bowel in the RIGHT pelvis, likely postoperative ileus. Minimal bowel wall thickening of some of the small bowel loops, which may be related to surgery. Infiltration of associated mesentery. No evidence of perforation. Vascular/Lymphatic: Aorta normal caliber.  No adenopathy. Reproductive: Unremarkable. Other: No free air or free fluid. No hernia or focal fluid collection. Stranding in presacral space consistent with preceding surgery. Surgical drain in pelvis. Musculoskeletal: Unremarkable IMPRESSION: Dilated small bowel loops up to 4.5 cm diameter with gradual transition from dilated to nondilated small bowel in the RIGHT pelvis, favor ileus in the postoperative period although obstruction is not entirely excluded. No evidence of perforation or abscess. Bibasilar subsegmental atelectasis. Electronically Signed   By: Lavonia Dana M.D.   On: 11/23/2020 16:16    Assessment: s/p  Patient Active Problem List   Diagnosis Date Noted   Ileus ([place]) 11/23/2020   Anastomotic stricture of colorectal region 11/19/2020   Medication management 10/12/2020   OSA on CPAP 11/01/2019   Peripheral neuropathy    Hyperlipidemia    Chronic back pain greater than 3 months duration    Right shoulder pain    Intraabdominal abscess 10/31/2019   Hand foot syndrome w  chemotherapy 04/04/2019   Port-A-Cath in place 03/27/2019   Cancer of rectum  ypT3, ypN0 s/p TNT & robotic LAR 10/15/2019 02/19/2019   Pain in  left knee 07/22/2018   Anxiety state 03/18/2007   DEPRESSION 03/18/2007   Allergic rhinitis 03/18/2007    Post op ileus, most likely due to expected post op ileus in conjunction with narcotic use  Plan: Will try limited clears today Ambulate in hall Limit narcotics Recheck labs in AM   LOS: 1 day     .Rosario Adie, MD [facility], [place]    11/25/2020 8:29 AM

## 2020-11-26 LAB — CBC
HCT: 39.1 % (ref 39.0–52.0)
Hemoglobin: 13 g/dL (ref 13.0–17.0)
MCH: 31.1 pg (ref 26.0–34.0)
MCHC: 33.2 g/dL (ref 30.0–36.0)
MCV: 93.5 fL (ref 80.0–100.0)
Platelets: 278 10*3/uL (ref 150–400)
RBC: 4.18 MIL/uL — ABNORMAL LOW (ref 4.22–5.81)
RDW: 13.2 % (ref 11.5–15.5)
WBC: 8.3 10*3/uL (ref 4.0–10.5)
nRBC: 0 % (ref 0.0–0.2)

## 2020-11-26 MED ORDER — IBUPROFEN 400 MG PO TABS: 400.0000 mg | ORAL_TABLET | Freq: Four times a day (QID) | ORAL | Status: DC | PRN

## 2020-11-26 MED ORDER — ACETAMINOPHEN 325 MG PO TABS
650.0000 mg | ORAL_TABLET | Freq: Four times a day (QID) | ORAL | Status: DC | PRN
  Administered 2020-11-26: 650 mg via ORAL
  Filled 2020-11-26: qty 2

## 2020-11-26 MED ORDER — OXYCODONE HCL 5 MG PO TABS
5.0000 mg | ORAL_TABLET | ORAL | Status: DC | PRN
  Administered 2020-11-26 – 2020-11-28 (×7): 10 mg via ORAL
  Filled 2020-11-26 (×7): qty 2

## 2020-11-26 MED ORDER — HYDROMORPHONE HCL 1 MG/ML IJ SOLN
1.0000 mg | INTRAMUSCULAR | Status: DC | PRN
  Administered 2020-11-27 (×2): 2 mg via INTRAVENOUS
  Filled 2020-11-26 (×2): qty 2

## 2020-11-26 NOTE — Plan of Care (Signed)
  Problem: Pain Managment: Goal: General experience of comfort will improve Outcome: Progressing   Problem: Safety: Goal: Ability to remain free from injury will improve Outcome: Progressing   

## 2020-11-26 NOTE — Progress Notes (Signed)
Pt like self placement on CPAP.

## 2020-11-26 NOTE — Progress Notes (Signed)
  Subjective: Having ostomy output, having less abd pain and no nausea  Objective: Vital signs in last 24 hours: Temp:  [97.6 F (36.4 C)-99.1 F (37.3 C)] 98.7 F (37.1 C) (06/17 0516) Pulse Rate:  [59-71] 71 (06/17 0516) Resp:  [16-18] 18 (06/17 0516) BP: (126-131)/(77-94) 126/77 (06/17 0516) SpO2:  [97 %-99 %] 97 % (06/17 0516)   Intake/Output from previous day: 06/16 0701 - 06/17 0700 In: 895.4 [I.V.:895.4] Out: 2982 [Urine:2375; Drains:32; Stool:575] Intake/Output this shift: No intake/output data recorded.   General appearance: alert and cooperative GI: soft, nondistended JP: SS fluid Incision: healing well  Lab Results:  Recent Labs    11/24/20 0653 11/26/20 0735  WBC 5.2 8.3  HGB 12.3* 13.0  HCT 37.4* 39.1  PLT 228 278    BMET Recent Labs    11/23/20 1440 11/24/20 0454  NA 140 137  K 3.6 4.5  CL 104 107  CO2 28 25  GLUCOSE 114* 122*  BUN 12 9  CREATININE 0.89 0.93  CALCIUM 9.3 8.4*    PT/INR No results for input(s): LABPROT, INR in the last 72 hours. ABG No results for input(s): PHART, HCO3 in the last 72 hours.  Invalid input(s): PCO2, PO2  MEDS, Scheduled  enoxaparin (LOVENOX) injection  40 mg Subcutaneous Q24H    Studies/Results: No results found.  Assessment: s/p  Patient Active Problem List   Diagnosis Date Noted   Ileus ([place]) 11/23/2020   Anastomotic stricture of colorectal region 11/19/2020   Medication management 10/12/2020   OSA on CPAP 11/01/2019   Peripheral neuropathy    Hyperlipidemia    Chronic back pain greater than 3 months duration    Right shoulder pain    Intraabdominal abscess 10/31/2019   Hand foot syndrome w chemotherapy 04/04/2019   Port-A-Cath in place 03/27/2019   Cancer of rectum  ypT3, ypN0 s/p TNT & robotic LAR 10/15/2019 02/19/2019   Pain in left knee 07/22/2018   Anxiety state 03/18/2007   DEPRESSION 03/18/2007   Allergic rhinitis 03/18/2007    Post op ileus, most likely due to expected  post op ileus in conjunction with narcotic use  Plan: Will try advancing to fulls today Cont to ambulate in hall Limit narcotics Advance diet as tolerated.  Will plan on removing JP prior to discharge   LOS: 2 days     .Rosario Adie, MD [facility], [place]    11/26/2020 10:11 AM

## 2020-11-27 NOTE — Progress Notes (Signed)
   Subjective/Chief Complaint: Some crampy lower abdominal pain.  Ileostomy outputs over a liter   Objective: Vital signs in last 24 hours: Temp:  [98.3 F (36.8 C)-99.4 F (37.4 C)] 98.4 F (36.9 C) (06/18 0438) Pulse Rate:  [60-66] 60 (06/18 0438) Resp:  [15-16] 16 (06/18 0438) BP: (121-138)/(79-99) 121/83 (06/18 0438) SpO2:  [98 %-100 %] 98 % (06/18 0438) Last BM Date: 11/26/20  Intake/Output from previous day: 06/17 0701 - 06/18 0700 In: 1336.5 [P.O.:1080; I.V.:256.5] Out: 3895 [Urine:1975; Drains:820; Stool:1100] Intake/Output this shift: Total I/O In: 240 [P.O.:240] Out: 0   General appearance: alert and cooperative Resp: clear to auscultation bilaterally Cardio: regular rate and rhythm Incision/Wound: Incisions clean.  Ostomy functioning well.  Drain appears serosanguineous  Lab Results:  Recent Labs    11/26/20 0735  WBC 8.3  HGB 13.0  HCT 39.1  PLT 278   BMET No results for input(s): NA, K, CL, CO2, GLUCOSE, BUN, CREATININE, CALCIUM in the last 72 hours. PT/INR No results for input(s): LABPROT, INR in the last 72 hours. ABG No results for input(s): PHART, HCO3 in the last 72 hours.  Invalid input(s): PCO2, PO2  Studies/Results: No results found.  Anti-infectives: Anti-infectives (From admission, onward)    None       Assessment/Plan: Patient Active Problem List   Diagnosis Date Noted   Ileus ([place]) 11/23/2020   Anastomotic stricture of colorectal region 11/19/2020   Medication management 10/12/2020   OSA on CPAP 11/01/2019   Peripheral neuropathy    Hyperlipidemia    Chronic back pain greater than 3 months duration    Right shoulder pain    Intraabdominal abscess 10/31/2019   Hand foot syndrome w chemotherapy 04/04/2019   Port-A-Cath in place 03/27/2019   Cancer of rectum  ypT3, ypN0 s/p TNT & robotic LAR 10/15/2019 02/19/2019   Pain in left knee 07/22/2018   Anxiety state 03/18/2007   DEPRESSION 03/18/2007   Allergic rhinitis  03/18/2007      Postop ileus improving  Continue ambulation  Advance diet  Limit narcotics  Possibly home in the next 24 to 48 hours depending on ileostomy output   LOS: 3 days    [name] A Veronda Gabor 11/27/2020

## 2020-11-28 MED ORDER — OXYCODONE HCL 5 MG PO TABS: 5.0000 mg | ORAL_TABLET | Freq: Four times a day (QID) | ORAL | 0 refills | Status: DC | PRN

## 2020-11-28 MED ORDER — IBUPROFEN 800 MG PO TABS: 800.0000 mg | ORAL_TABLET | Freq: Three times a day (TID) | ORAL | 0 refills | Status: DC | PRN

## 2020-11-28 NOTE — Discharge Summary (Signed)
Physician Discharge Summary  Patient ID: Brandon Barry MRN: [id number] DOB/AGE: [birth year] 45 y.o.  Admit date: 11/23/2020 Discharge date: 11/28/2020  Admission [name]   Discharge Diagnoses:  Active Problems:   Ileus [name])   Discharged Condition: good  Hospital Course: Pt readmitted due to ileus after undergoing robotic LAR on 11/19/2020 for stricture. He was discharged and readmitted on 11/25/2020 due to ileus.  He was placed on bowel rest but eventually opened up after 2 days in the hospital.  Ostomy output increased and his pain improved. Once he could tolerate his diet and keep up with his ostomy output, he was discharged home.  Consults: None  Significant Diagnostic Studies: labs:  CMP Latest Ref Rng & Units 11/24/2020 11/23/2020 11/22/2020  Glucose 70 - 99 mg/dL 122(H) 114(H) 90  BUN 6 - 20 mg/dL 9 12 14   Creatinine 0.61 - 1.24 mg/dL 0.93 0.89 0.83  Sodium 135 - 145 mmol/L 137 140 138  Potassium 3.5 - 5.1 mmol/L 4.5 3.6 4.2  Chloride 98 - 111 mmol/L 107 104 106  CO2 22 - 32 mmol/L 25 28 28   Calcium 8.9 - 10.3 mg/dL 8.4(L) 9.3 8.4(L)  Total Protein 6.5 - 8.1 g/dL 5.7(L) 7.4 -  Total Bilirubin 0.3 - 1.2 mg/dL 0.7 0.9 -  Alkaline Phos 38 - 126 U/L 113 169(H) -  AST 15 - 41 U/L 35 56(H) -  ALT 0 - 44 U/L 109(H) 161(H) -   CBC    Component Value Date/Time   WBC 8.3 11/26/2020 0735   RBC 4.18 (L) 11/26/2020 0735   HGB 13.0 11/26/2020 0735   HGB 12.8 (L) 08/11/2019 1050   HCT 39.1 11/26/2020 0735   PLT 278 11/26/2020 0735   PLT 231 08/11/2019 1050   MCV 93.5 11/26/2020 0735   MCH 31.1 11/26/2020 0735   MCHC 33.2 11/26/2020 0735   RDW 13.2 11/26/2020 0735   LYMPHSABS 0.5 (L) 11/23/2020 1440   MONOABS 0.6 11/23/2020 1440   EOSABS 0.0 11/23/2020 1440   BASOSABS 0.0 11/23/2020 1440     Treatments: IV hydration  Discharge Exam: Blood pressure 124/89, pulse 93, temperature 99 F (37.2 C), temperature source Oral, resp. rate 18, height 6' (1.829 m), weight 78.9  kg, SpO2 98 %. General appearance: alert and cooperative Resp: clear to auscultation bilaterally Cardio: regular rate and rhythm Incision/Wound:ostomy viable abdomen soft incision CDI   Disposition: Discharge disposition: 01-Home or Self Care       Discharge Instructions     Diet - low sodium heart healthy   Complete by: As directed    Increase activity slowly   Complete by: As directed       Allergies as of 11/28/2020   No Known Allergies      Medication List     TAKE these medications    acetaminophen 500 MG tablet Commonly known as: TYLENOL Take 1,000 mg by mouth every 6 (six) hours as needed for moderate pain or headache.   ibuprofen 800 MG tablet Commonly known as: ADVIL Take 1 tablet (800 mg total) by mouth every 8 (eight) hours as needed.   loperamide 2 MG capsule Commonly known as: IMODIUM Take 1 capsule (2 mg total) by mouth daily before lunch. What changed:  when to take this reasons to take this   oxyCODONE 5 MG immediate release tablet Commonly known as: Oxy IR/ROXICODONE Take 1 tablet (5 mg total) by mouth every 6 (six) hours as needed for severe pain. What changed:  how much  to take reasons to take this         Signed: Joyice Faster Kimbery Harwood 11/28/2020, 8:48 AM

## 2020-11-28 NOTE — Progress Notes (Signed)
Reviewed d/c instructions w pt and all questions answered. Pt verbalized understanding. JP drain removed intact w site unremarkable. D/c per w/c w all belongings in stable condition.

## 2020-12-30 ENCOUNTER — Encounter: Payer: Self-pay | Admitting: Oncology

## 2020-12-31 ENCOUNTER — Telehealth: Payer: Self-pay | Admitting: *Deleted

## 2020-12-31 DIAGNOSIS — C20 Malignant neoplasm of rectum: Secondary | ICD-10-CM

## 2020-12-31 NOTE — Telephone Encounter (Signed)
Patient called and sent Mychart message requesting a 2nd opinion at [facility] w/Dr. Morrell Riddle [facility] regarding his LAR on 10/14/20 and subsequent surgery on 11/19/20. Still having a lot of pain.  OK for referral per Dr. Benay Spice.

## 2020-12-31 NOTE — Telephone Encounter (Signed)
Faxed referral order, demographics to [facility] for Dr. Wanda Plump for 2nd opinion [phone]. Notified radiology to push over last 2 CT images to [facility]. Patient notified that referral was sent.

## 2021-01-27 ENCOUNTER — Encounter: Payer: Self-pay | Admitting: Oncology

## 2021-02-08 ENCOUNTER — Encounter: Payer: Self-pay | Admitting: Oncology

## 2021-02-08 ENCOUNTER — Other Ambulatory Visit: Payer: Self-pay | Admitting: Nurse Practitioner

## 2021-02-08 DIAGNOSIS — C20 Malignant neoplasm of rectum: Secondary | ICD-10-CM

## 2021-02-09 ENCOUNTER — Encounter: Payer: Self-pay | Admitting: *Deleted

## 2021-02-22 ENCOUNTER — Other Ambulatory Visit: Payer: Self-pay

## 2021-02-22 ENCOUNTER — Ambulatory Visit (HOSPITAL_BASED_OUTPATIENT_CLINIC_OR_DEPARTMENT_OTHER)
Admission: RE | Admit: 2021-02-22 | Discharge: 2021-02-22 | Disposition: A | Discharge status: Home / Self Care | Payer: Commercial Managed Care - PPO | Source: Ambulatory Visit | Attending: Nurse Practitioner | Admitting: Nurse Practitioner

## 2021-02-22 ENCOUNTER — Inpatient Hospital Stay: Payer: Commercial Managed Care - PPO | Attending: Oncology

## 2021-02-22 DIAGNOSIS — N4889 Other specified disorders of penis: Secondary | ICD-10-CM | POA: Diagnosis not present

## 2021-02-22 DIAGNOSIS — C20 Malignant neoplasm of rectum: Secondary | ICD-10-CM

## 2021-02-22 DIAGNOSIS — R933 Abnormal findings on diagnostic imaging of other parts of digestive tract: Secondary | ICD-10-CM | POA: Diagnosis not present

## 2021-02-22 DIAGNOSIS — Z85040 Personal history of malignant carcinoid tumor of rectum: Secondary | ICD-10-CM | POA: Insufficient documentation

## 2021-02-22 DIAGNOSIS — Z932 Ileostomy status: Secondary | ICD-10-CM | POA: Diagnosis not present

## 2021-02-22 LAB — CMP (CANCER CENTER ONLY)
ALT: 38 U/L (ref 0–44)
AST: 28 U/L (ref 15–41)
Albumin: 4.6 g/dL (ref 3.5–5.0)
Alkaline Phosphatase: 65 U/L (ref 38–126)
Anion gap: 8 (ref 5–15)
BUN: 16 mg/dL (ref 6–20)
CO2: 27 mmol/L (ref 22–32)
Calcium: 9.7 mg/dL (ref 8.9–10.3)
Chloride: 103 mmol/L (ref 98–111)
Creatinine: 1.03 mg/dL (ref 0.61–1.24)
GFR, Estimated: >60 mL/min (ref >60)
Glucose, Bld: 104 mg/dL — ABNORMAL HIGH (ref 70–99)
Potassium: 4.2 mmol/L (ref 3.5–5.1)
Sodium: 138 mmol/L (ref 135–145)
Total Bilirubin: 0.7 mg/dL (ref 0.3–1.2)
Total Protein: 7.1 g/dL (ref 6.5–8.1)

## 2021-02-22 LAB — CEA (ACCESS): CEA (CHCC): 2.05 ng/mL (ref 0.00–5.00)

## 2021-02-22 MED ORDER — IOHEXOL 350 MG/ML SOLN
75.0000 mL | Freq: Once | INTRAVENOUS | Status: AC | PRN
  Administered 2021-02-22: 75 mL via INTRAVENOUS

## 2021-02-23 ENCOUNTER — Other Ambulatory Visit: Payer: Self-pay | Admitting: *Deleted

## 2021-02-23 ENCOUNTER — Ambulatory Visit
Admission: RE | Admit: 2021-02-23 | Discharge: 2021-02-23 | Disposition: A | Discharge status: Home / Self Care | Payer: Self-pay | Source: Ambulatory Visit | Attending: Oncology | Admitting: Oncology

## 2021-02-23 ENCOUNTER — Encounter: Payer: Self-pay | Admitting: Oncology

## 2021-02-23 DIAGNOSIS — C20 Malignant neoplasm of rectum: Secondary | ICD-10-CM

## 2021-02-23 LAB — CEA (IN HOUSE-CHCC): CEA (CHCC-In House): 1.91 ng/mL (ref 0.00–5.00)

## 2021-02-23 NOTE — Progress Notes (Signed)
Requested import MRI pelvis images dated 8/18 from [facility] and for CT images from 02/22/21 to [facility].

## 2021-02-25 ENCOUNTER — Other Ambulatory Visit: Payer: Commercial Managed Care - PPO

## 2021-02-25 ENCOUNTER — Other Ambulatory Visit: Payer: Self-pay

## 2021-02-25 ENCOUNTER — Inpatient Hospital Stay (HOSPITAL_BASED_OUTPATIENT_CLINIC_OR_DEPARTMENT_OTHER): Payer: Commercial Managed Care - PPO | Admitting: Oncology

## 2021-02-25 VITALS — BP 142/94 | HR 78 | Temp 98.4°F | Resp 18 | Wt 172.0 lb

## 2021-02-25 DIAGNOSIS — C20 Malignant neoplasm of rectum: Secondary | ICD-10-CM

## 2021-02-25 DIAGNOSIS — Z85040 Personal history of malignant carcinoid tumor of rectum: Secondary | ICD-10-CM | POA: Diagnosis not present

## 2021-02-25 NOTE — Progress Notes (Signed)
[place] OFFICE PROGRESS NOTE   Diagnosis: Rectal cancer  INTERVAL HISTORY:   Brandon Barry returns as scheduled.  He underwent a redo low anterior resection in June for management of a rectal stricture.  He was recently evaluated at [facility] by Dr. Wanda Plump and noted to have a posterior defect at 7 cm in the anal verge.  He is scheduled for an MRI and follow-up visit at a 55-monthinterval.  Mr. NStaplesreports feeling well at present.  He is working.  Neuropathy symptoms have improved.  He complains of pain at the right side of the mid to distal penis when he has an erection.  There is a palpable firmness in this area.  He has no difficulty obtaining an erection.  No other complaint.  Objective:  Vital signs in last 24 hours:  Blood pressure (!) 142/94, pulse 78, temperature 98.4 F (36.9 C), temperature source Oral, resp. rate 18, weight 172 lb (78 kg), SpO2 100 %.    Lymphatics: No cervical, supraclavicular, axillary, or inguinal nodes Resp: Lungs clear bilaterally Cardio: Regular rate and rhythm GI: No hepatosplenomegaly, right abdomen ileostomy, no mass Vascular: No leg edema GU: At the mid to distal right side of the penile shaft there is a firm area measuring approximately 1.5 cm.  No tenderness.  No abnormality of the overlying skin.   Lab Results:  Lab Results  Component Value Date   WBC 8.3 11/26/2020   HGB 13.0 11/26/2020   HCT 39.1 11/26/2020   MCV 93.5 11/26/2020   PLT 278 11/26/2020   NEUTROABS 10.2 (H) 11/23/2020    CMP  Lab Results  Component Value Date   NA 138 02/22/2021   K 4.2 02/22/2021   CL 103 02/22/2021   CO2 27 02/22/2021   GLUCOSE 104 (H) 02/22/2021   BUN 16 02/22/2021   CREATININE 1.03 02/22/2021   CALCIUM 9.7 02/22/2021   PROT 7.1 02/22/2021   ALBUMIN 4.6 02/22/2021   AST 28 02/22/2021   ALT 38 02/22/2021   ALKPHOS 65 02/22/2021   BILITOT 0.7 02/22/2021   GFRNONAA >60 02/22/2021   GFRAA >60 11/30/2019    Lab Results   Component Value Date   CEA1 1.91 02/22/2021   CEA 2.05 02/22/2021    Lab Results  Component Value Date   INR 1.2 11/01/2019   LABPROT 14.9 11/01/2019    Imaging:  CT CHEST ABDOMEN PELVIS W CONTRAST  Result Date: 02/23/2021 CLINICAL DATA:  Rectal cancer restaging.  Penile lesion. EXAM: CT CHEST, ABDOMEN, AND PELVIS WITH CONTRAST TECHNIQUE: Multidetector CT imaging of the chest, abdomen and pelvis was performed following the standard protocol during bolus administration of intravenous contrast. CONTRAST:  746mOMNIPAQUE IOHEXOL 350 MG/ML SOLN COMPARISON:  Multiple exams, including 11/23/2020 and 08/18/2020 FINDINGS: CT CHEST FINDINGS Cardiovascular: Unremarkable Mediastinum/Nodes: 1.1 by 0.6 cm hypodense nodule in the left thyroid lobe on image 5 series 2. Not clinically significant; no follow-up imaging recommended (ref: J Am Coll Radiol. 2015 Feb;12(2): 143-50).No pathologic adenopathy is identified in the chest. Lungs/Pleura: 0.3 cm right lower lobe nodule on image 90 series 4, stable back through earliest available comparison of 02/13/2019. 2 mm calcified left upper lobe nodule on image 81 series 4, stable back through 02/13/2019. Musculoskeletal: Small lucent lesion in the right seventh rib laterally on image 114 series 4 has thin sclerotic margin and is unchanged from earliest available comparison of 02/13/2019, highly likely to be benign. CT ABDOMEN PELVIS FINDINGS Hepatobiliary: Stable 3.6 by 2.3 cm subcapsular  benign hemangioma in the right hepatic lobe on image 62 series 2. Just below this, a 1.1 cm in diameter hypodense lesion demonstrates delayed enhancement favoring hemangioma and appears stable from the earliest available comparison of 02/04/2019, further favoring hemangioma. No new or suspicious hepatic lesions identified. Mildly contracted gallbladder. Pancreas: Unremarkable Spleen: Unremarkable Adrenals/Urinary Tract: Diffuse mild urinary bladder wall thickening noted although much of  this may be related to nondistention. The kidneys and ureters appear unremarkable. Prostate gland indents the bladder base. Adrenal glands unremarkable. Stomach/Bowel: Right abdominal side ileostomy noted, bypassing the terminal ileum and colon. Previous pelvic drainage catheters been removed. Prior small bowel dilatation has resolved. Partial obscuration of the rectum in the vicinity of the perirectal and presacral soft tissue density much of which may be therapy related along the lower rectum for example on image 115 of series 2 there is mildly reduced conspicuity of the perirectal fatty tissue planes, potentially therapy related but meriting surveillance. Along the right side of the higher density elements compatible with anastomotic line, the underlying soft tissues measure 1.7 cm on image 115 of series 2, previously measured about 1.3 cm in this location on 11/23/2020. Vascular/Lymphatic: No substantial adenopathy or significant amount of atherosclerotic vascular disease is identified. Reproductive: Reportedly there is concern for lesion along the right side of the distal penile shaft. A marker indicating the approximate location is observed on image 134 series 2. There is some mild adjacent indistinctness of the contour of the corpus cavernosum in this region although significance of this is uncertain. Prominent median lobe of the prostate gland indents bladder base. Indistinctness of the posterior margin of the seminal vesicles, especially on the right side, likely related to regional therapy. Other: No supplemental non-categorized findings. Musculoskeletal: Small sclerotic lesion in the left iliac bone on image 98 series 2 is unchanged from earliest available comparison of 02/04/2019 and highly likely to be a benign lesion such as bone island. Small umbilical hernia contains adipose tissue. IMPRESSION: 1. Postoperative/post therapy related findings in the lower pelvis in the perirectal space. There is some  interval reduction in conspicuity of perirectal fat planes as well as some slight thickening of the perirectal soft tissue densities in this region compared to the 11/23/2020 exam, as measured on the right side. This may well simply represent a manifestation of prior regional therapy and of the redo anastomotic surgery 11/19/2020, but clearly the region merits surveillance to exclude any progression which might indicate local recurrence. 2. With respect to the reported penile lesion, there is some slight reduction in conspicuity of the corpus cavernosum along with some equivocal accentuated central density within the penis in the vicinity of the urethra in the region of concern. However, there can be substantial variability in the CT appearance of the penile shaft in normal patients, and if not already performed I would suggest urology referral as a prelude to potential further workup such as penile MRI or ultrasound. 3. Stable right hepatic lobe hemangiomas. Two tiny pulmonary nodules are stable from 2020, and highly likely benign. 4. Right abdominal side ileostomy. 5. Prominent median lobe of the prostate gland indents the bladder base. 6. Diffuse mild urinary bladder wall thickening noted although much of this may be related to nondistention. Electronically Signed   By: Van Clines M.D.   On: 02/23/2021 09:09    Medications: I have reviewed the patient's current medications.   Assessment/Plan: Rectosigmoid cancer  Colonoscopy by Dr. Benson Norway 02/04/2019-fungating infiltrative and ulcerated nonobstructing large mass in the rectum.  The mass was circumferential measuring 3 cm in length.  There was no bleeding present.  The mass was 10 cm from the anal verge.  Biopsy showed invasive adenocarcinoma, moderately differentiated; MLH1, MSH2, MSH6 and PMS2 intact.   CTs abdomen and pelvis 02/04/2019-apparent colorectal mass measuring 3.9 x 5.4 x 3.2 cm involving the distal sigmoid colon and proximal rectum with  adjacent borderline enlarged suspicious mesorectal lymph nodes; indeterminate lesion in segment 6 of the liver measuring 1.4 x 1.1 cm. MRI pelvis 02/13/2019- T3c, N1 tumor at 11.5 cm from the anal verge, 6.7 cm from the internal anal sphincter, 3 lymph nodes greater than 5 mm, largest 8 mm CT chest 02/13/2019-negative for metastatic disease MRI liver 02/13/2019- 3.6 cm hemangioma in the posterior right hepatic lobe, 10 mm indeterminate lesion in the posterior right hepatic lobe-potentially an atypical hemangioma- solitary metastasis not excluded Cycle 1 FOLFOX 02/26/2019 Cycle 2 FOLFOX 03/13/2019, Emend added Cycle 3 FOLFOX 03/27/2019 Cycle 4 FOLFOX 04/10/2019 Cycle 5 FOLFOX 04/24/2019 Cycle 6 FOLFOX 05/07/2019  Cycle 7 FOLFOX 05/22/2019 (5-fluorouracil dose reduced) Cycle 8 FOLFOX 06/09/2019 Capecitabine/radiation 06/30/2019-07/06/2019 Capecitabine placed on hold with the p.m. dose on 07/30/2019, 1000 mg twice daily Capecitabine resumed with the p.m. dose on 08/04/2019 with a dose reduction CT liver 08/11/2019-stable subcapsular hemangioma right hepatic lobe.  Stable small indeterminate right hepatic lobe lesion unchanged in size from CT 02/04/2019. 10/15/2019, robotic assisted low anterior resection/Port-A-Cath removal, adenocarcinoma of distal sigmoid/proximal rectum invading pericolonic connective tissue, negative resection margins, 0/24 nodes,ypT3ypN0, treatment effect present-tumor associated fibrosis  CT abdomen/pelvis 10/31/2019-anastomotic breakdown at the rectum, extraluminal stool, right lower quadrant abscess, small bowel obstruction, air within the bladder lumen, stable subcentimeter right hepatic nodule CT abdomen/pelvis 11/15/2019-stable 1 cm segment 7 lesion, no new liver lesions, anastomotic breakdown at the rectum with decreased size of complex surrounding gas/fluid collection in good position of transgluteal drain, persistent small bowel obstruction CT pelvis 02/27/2020-no measurable perianastomotic  fluid collection or free air, no evidence of metastatic disease CTs 08/19/2020-stable 2 mm left upper lobe pulmonary nodule.  No other suspicious appearing pulmonary nodules or masses noted.  Cavernous hemangiomas in the liver, similar to the prior examinations.  Postoperative changes with what appears to be some postoperative scarring in the presacral space adjacent to the suture line at the rectosigmoid anastomosis. Redo low anterior resection 11/19/2020 for management of a stricture Evaluation by Dr. Wanda Plump at [facility] 01/10/2021-posterior anastomotic defect on exam at 7 cm, sigmoidoscopy confirmed a 2 cm wide anastomotic defect in the posterior midline CTs 02/22/2021-treatment related changes in the pelvis, reduction in conspicuity of perirectal fat planes, stable right hepatic lobe hemangiomas, stable pulmonary nodules, right abdominal ileostomy Change in bowel habits/rectal pain secondary to #1, improved Port-A-Cath placement, Dr. Hassell Done, 02/21/2019 Early oxaliplatin neuropathy-trial of Neurontin initiated 08/25/2019 due to foot pain, improved Pain secondary to hand/foot syndrome, radiation skin toxicity, and radiation proctitis-improved Admission with intra-abdominal abscess/anastomotic dehiscence, surgery 11/25/2019- exploratory laparotomy, placement of wound VAC and drain, diverting loop ileostomy       Disposition: Brandon Barry remains in clinical remission from rectal cancer.  He continues to have a diverting loop ileostomy.  A posterior rectal defect was confirmed on exam at [facility] last month.  He is scheduled for a follow-up MRI and office visit at a 12-monthinterval.  He will return for an office visit and CEA in 6 months.  The etiology of the discomfort and firm area at the distal penile shaft is unclear.  I will make a urology referral.  I will see if Dr. Lucious Groves can see him since he helped perform his surgery in June.  Betsy Coder, MD  02/25/2021  11:10 AM

## 2021-02-28 ENCOUNTER — Encounter: Payer: Self-pay | Admitting: *Deleted

## 2021-02-28 ENCOUNTER — Telehealth: Payer: Self-pay

## 2021-02-28 NOTE — Telephone Encounter (Signed)
Pt scheduled to see Dr. Jeffie Pollock at [facility] on 03/04/21 at 330.

## 2021-02-28 NOTE — Progress Notes (Signed)
Faxed referral order, demographics and chart information to [facility] [phone].

## 2021-08-26 ENCOUNTER — Inpatient Hospital Stay: Payer: Commercial Managed Care - PPO | Admitting: Oncology

## 2021-08-26 ENCOUNTER — Inpatient Hospital Stay: Payer: Commercial Managed Care - PPO

## 2021-09-01 ENCOUNTER — Encounter: Payer: Self-pay | Admitting: Nurse Practitioner

## 2021-09-01 ENCOUNTER — Inpatient Hospital Stay (HOSPITAL_BASED_OUTPATIENT_CLINIC_OR_DEPARTMENT_OTHER): Payer: Commercial Managed Care - PPO | Admitting: Nurse Practitioner

## 2021-09-01 ENCOUNTER — Inpatient Hospital Stay: Payer: Commercial Managed Care - PPO | Attending: Oncology

## 2021-09-01 ENCOUNTER — Other Ambulatory Visit: Payer: Self-pay

## 2021-09-01 VITALS — BP 132/100 | HR 73 | Temp 97.9°F | Resp 18 | Ht 72.0 in | Wt 179.4 lb

## 2021-09-01 DIAGNOSIS — D1803 Hemangioma of intra-abdominal structures: Secondary | ICD-10-CM | POA: Diagnosis not present

## 2021-09-01 DIAGNOSIS — G620 Drug-induced polyneuropathy: Secondary | ICD-10-CM | POA: Insufficient documentation

## 2021-09-01 DIAGNOSIS — K56609 Unspecified intestinal obstruction, unspecified as to partial versus complete obstruction: Secondary | ICD-10-CM | POA: Diagnosis not present

## 2021-09-01 DIAGNOSIS — C19 Malignant neoplasm of rectosigmoid junction: Secondary | ICD-10-CM | POA: Insufficient documentation

## 2021-09-01 DIAGNOSIS — C20 Malignant neoplasm of rectum: Secondary | ICD-10-CM | POA: Diagnosis not present

## 2021-09-01 LAB — CEA (ACCESS): CEA (CHCC): 2.03 ng/mL (ref 0.00–5.00)

## 2021-09-01 NOTE — Progress Notes (Signed)
?[place] ?OFFICE PROGRESS NOTE ? ? ?Diagnosis: Rectal cancer ? ?INTERVAL HISTORY:  ? ?Brandon Barry returns as scheduled.  He is feeling well.  He has a good appetite.  No pain.  No fever.  Ileostomy functioning normally. ? ?Objective: ? ?Vital signs in last 24 hours: ? ?Blood pressure (!) 132/100, pulse 73, temperature 97.9 ?F (36.6 ?C), temperature source Oral, resp. rate 18, height 6' (1.829 m), weight 179 lb 6.4 oz (81.4 kg), SpO2 100 %. ?  ? ?Lymphatics: No palpable cervical, supraclavicular, axillary or inguinal lymph nodes. ?Resp: Lungs clear bilaterally. ?Cardio: Regular rate and rhythm. ?GI: Abdomen soft and nontender.  No hepatomegaly.  Right abdomen ileostomy. ?Vascular: No leg edema. ? ? ? ?Lab Results: ? ?Lab Results  ?Component Value Date  ? WBC 8.3 11/26/2020  ? HGB 13.0 11/26/2020  ? HCT 39.1 11/26/2020  ? MCV 93.5 11/26/2020  ? PLT 278 11/26/2020  ? NEUTROABS 10.2 (H) 11/23/2020  ? ? ?Imaging: ? ?No results found. ? ?Medications: I have reviewed the patient's current medications. ? ?Assessment/Plan: ?Rectosigmoid cancer  ?Colonoscopy by Dr. Benson Norway 02/04/2019-fungating infiltrative and ulcerated nonobstructing large mass in the rectum.  The mass was circumferential measuring 3 cm in length.  There was no bleeding present.  The mass was 10 cm from the anal verge.  Biopsy showed invasive adenocarcinoma, moderately differentiated; MLH1, MSH2, MSH6 and PMS2 intact.   ?CTs abdomen and pelvis 02/04/2019-apparent colorectal mass measuring 3.9 x 5.4 x 3.2 cm involving the distal sigmoid colon and proximal rectum with adjacent borderline enlarged suspicious mesorectal lymph nodes; indeterminate lesion in segment 6 of the liver measuring 1.4 x 1.1 cm. ?MRI pelvis 02/13/2019- T3c, N1 tumor at 11.5 cm from the anal verge, 6.7 cm from the internal anal sphincter, 3 lymph nodes greater than 5 mm, largest 8 mm ?CT chest 02/13/2019-negative for metastatic disease ?MRI liver 02/13/2019- 3.6 cm hemangioma in the  posterior right hepatic lobe, 10 mm indeterminate lesion in the posterior right hepatic lobe-potentially an atypical hemangioma- solitary metastasis not excluded ?Cycle 1 FOLFOX 02/26/2019 ?Cycle 2 FOLFOX 03/13/2019, Emend added ?Cycle 3 FOLFOX 03/27/2019 ?Cycle 4 FOLFOX 04/10/2019 ?Cycle 5 FOLFOX 04/24/2019 ?Cycle 6 FOLFOX 05/07/2019  ?Cycle 7 FOLFOX 05/22/2019 (5-fluorouracil dose reduced) ?Cycle 8 FOLFOX 06/09/2019 ?Capecitabine/radiation 06/30/2019-07/06/2019 ?Capecitabine placed on hold with the p.m. dose on 07/30/2019, 1000 mg twice daily ?Capecitabine resumed with the p.m. dose on 08/04/2019 with a dose reduction ?CT liver 08/11/2019-stable subcapsular hemangioma right hepatic lobe.  Stable small indeterminate right hepatic lobe lesion unchanged in size from CT 02/04/2019. ?10/15/2019, robotic assisted low anterior resection/Port-A-Cath removal, adenocarcinoma of distal sigmoid/proximal rectum invading pericolonic connective tissue, negative resection margins, 0/24 nodes,ypT3ypN0, treatment effect present-tumor associated fibrosis  ?CT abdomen/pelvis 10/31/2019-anastomotic breakdown at the rectum, extraluminal stool, right lower quadrant abscess, small bowel obstruction, air within the bladder lumen, stable subcentimeter right hepatic nodule ?CT abdomen/pelvis 11/15/2019-stable 1 cm segment 7 lesion, no new liver lesions, anastomotic breakdown at the rectum with decreased size of complex surrounding gas/fluid collection in good position of transgluteal drain, persistent small bowel obstruction ?CT pelvis 02/27/2020-no measurable perianastomotic fluid collection or free air, no evidence of metastatic disease ?CTs 08/19/2020-stable 2 mm left upper lobe pulmonary nodule.  No other suspicious appearing pulmonary nodules or masses noted.  Cavernous hemangiomas in the liver, similar to the prior examinations.  Postoperative changes with what appears to be some postoperative scarring in the presacral space adjacent to the suture  line at the rectosigmoid anastomosis. ?Redo low anterior  resection 11/19/2020 for management of a stricture ?Evaluation by Dr. Wanda Plump at [facility] 01/10/2021-posterior anastomotic defect on exam at 7 cm, sigmoidoscopy confirmed a 2 cm wide anastomotic defect in the posterior midline ?CTs 02/22/2021-treatment related changes in the pelvis, reduction in conspicuity of perirectal fat planes, stable right hepatic lobe hemangiomas, stable pulmonary nodules, right abdominal ileostomy ?MRI pelvis 05/03/2021-postsurgical changes of prior low anterior resection without definite evidence of recurrent or metastatic disease in the pelvis.  Decrease in size of a loculated fluid collection in the presacral space, bordered posteriorly and laterally by T2 hypointense soft tissue representing likely chronic scarring/fibrosis which appears increased. ?CTs 08/12/2021-compared with 05/03/2021 MRI of the pelvis there is overall similar soft tissue in the presacral space surrounding the colorectal anastomosis at the site of prior contained perforation.  Unchanged hepatic segment 7 enhancing lesion previously characterized as hemangioma.  Unchanged subcentimeter hypodensity hepatic segment 6.  Unchanged 0.3 cm right lower lobe lung nodule.  No new or enlarging nodules. ?Change in bowel habits/rectal pain secondary to #1, improved ?Port-A-Cath placement, Dr. Hassell Done, 02/21/2019 ?Early oxaliplatin neuropathy-trial of Neurontin initiated 08/25/2019 due to foot pain, improved ?Pain secondary to hand/foot syndrome, radiation skin toxicity, and radiation proctitis-improved ?Admission with intra-abdominal abscess/anastomotic dehiscence, surgery 11/25/2019- exploratory laparotomy, placement of wound VAC and drain, diverting loop ileostomy ?  ?  ? ?Disposition: Brandon Barry remains in clinical remission from rectal cancer.  We will follow-up on the CEA from today.   ? ?He continues to have a diverting loop ileostomy.  He is being followed at [facility]. ? ?He will return  for CEA and follow-up visit here in 6 months.  We are available to see him sooner if needed. ? ? ?Ned Card ANP/GNP-BC  ? ?09/01/2021  ?1:58 PM ? ? ? ? ? ? ? ?

## 2021-09-02 ENCOUNTER — Telehealth: Payer: Self-pay

## 2021-09-02 NOTE — Telephone Encounter (Signed)
-----   Message from Owens Shark, NP sent at 09/02/2021  8:31 AM EDT ----- ?Please let him know CEA is stable in normal range ? ?

## 2021-09-02 NOTE — Telephone Encounter (Signed)
Patient gave verbal understanding and denied further questions ?

## 2021-10-13 ENCOUNTER — Ambulatory Visit (INDEPENDENT_AMBULATORY_CARE_PROVIDER_SITE_OTHER): Payer: Commercial Managed Care - PPO | Admitting: Adult Health

## 2021-10-13 ENCOUNTER — Encounter: Payer: Self-pay | Admitting: Adult Health

## 2021-10-13 VITALS — BP 123/83 | HR 62 | Ht 72.0 in | Wt 182.6 lb

## 2021-10-13 DIAGNOSIS — G4733 Obstructive sleep apnea (adult) (pediatric): Secondary | ICD-10-CM | POA: Diagnosis not present

## 2021-10-13 DIAGNOSIS — Z9989 Dependence on other enabling machines and devices: Secondary | ICD-10-CM | POA: Diagnosis not present

## 2021-10-13 NOTE — Progress Notes (Signed)
? ? ?PATIENT: Brandon Barry ?DOB: Sep 10, 1975 ? ?REASON FOR VISIT: follow up ?HISTORY FROM: patient ?PRIMARY NEUROLOGIST: Dr. Rexene Alberts ? ?Chief Complaint  ?Patient presents with  ? Follow-up  ?  Pt in 19  pt is here for CPAP follow up  Pt has no questions or concerns for today's visit   ? ? ? ?HISTORY OF PRESENT ILLNESS: ?Today 10/13/21: ? ?Brandon Barry is a 46 year old male with a history of OSA on CPAP. He returns today for follow-up.  Reports that CPAP is working well.He denies any new issues.He returns today for an evaluation. ? ? ? ?REVIEW OF SYSTEMS: Out of a complete 14 system review of symptoms, the patient complains only of the following symptoms, and all other reviewed systems are negative. ? ?FSS 18 ?ESS 5 ? ?ALLERGIES: ?No Known Allergies ? ?HOME MEDICATIONS: ?Outpatient Medications Prior to Visit  ?Medication Sig Dispense Refill  ? acetaminophen (TYLENOL) 500 MG tablet Take 1,000 mg by mouth every 6 (six) hours as needed for moderate pain or headache.    ? ibuprofen (ADVIL) 800 MG tablet Take 1 tablet (800 mg total) by mouth every 8 (eight) hours as needed. 30 tablet 0  ? loperamide (IMODIUM) 2 MG capsule Take 1 capsule (2 mg total) by mouth daily before lunch. (Patient taking differently: Take 2 mg by mouth as needed.)    ? ?No facility-administered medications prior to visit.  ? ? ?PAST MEDICAL HISTORY: ?Past Medical History:  ?Diagnosis Date  ? Hyperlipidemia   ? OSA on CPAP   ? Peripheral neuropathy   ? Hands and feet from Chemo  ? Rectal adenocarcinoma ([place])   ? Right shoulder pain   ? ? ?PAST SURGICAL HISTORY: ?Past Surgical History:  ?Procedure Laterality Date  ? APPLICATION OF WOUND VAC N/A 11/25/2019  ? Procedure: APPLICATION OF WOUND VAC AND DRAIN PLACEMENT;  Surgeon: Leighton Ruff, MD;  [facility]: [facility];  Service: General;  Laterality: N/A;  ? BALLOON DILATION N/A 06/03/2020  ? Procedure: BALLOON DILATION;  Surgeon: Carol Ada, MD;  Location: [facility];  Service: Endoscopy;  Laterality: N/A;   ? BALLOON DILATION N/A 07/30/2020  ? Procedure: BALLOON DILATION;  Surgeon: Carol Ada, MD;  Location: [facility];  Service: Endoscopy;  Laterality: N/A;  ? COLONOSCOPY  02/04/2019  ? FLEXIBLE SIGMOIDOSCOPY N/A 04/22/2020  ? Procedure: FLEXIBLE SIGMOIDOSCOPY;  Surgeon: Leighton Ruff, MD;  [facility];  Service: Endoscopy;  Laterality: N/A;  ? FLEXIBLE SIGMOIDOSCOPY N/A 06/03/2020  ? Procedure: FLEXIBLE SIGMOIDOSCOPY;  Surgeon: Carol Ada, MD;  Location: [facility];  Service: Endoscopy;  Laterality: N/A;  ? FLEXIBLE SIGMOIDOSCOPY N/A 07/30/2020  ? Procedure: FLEXIBLE SIGMOIDOSCOPY;  Surgeon: Carol Ada, MD;  Location: [facility];  Service: Endoscopy;  Laterality: N/A;  ? ILEOSTOMY Right 11/25/2019  ? Procedure: PLACEMENT OF ILEOSTOMY;  Surgeon: Leighton Ruff, MD;  [facility];  Service: General;  Laterality: Right;  ? IR CATHETER TUBE CHANGE  11/11/2019  ? IR PATIENT EVAL TECH 0-60 MINS  12/22/2019  ? KNEE ARTHROSCOPY Left 11/19/2018  ? LAPAROTOMY N/A 11/25/2019  ? Procedure: Sela Hilding WITH OPEN PELVIC WASHOUT;  Surgeon: Leighton Ruff, MD;  [facility];  Service: General;  Laterality: N/A;  ? PORT-A-CATH REMOVAL N/A 10/15/2019  ? Procedure: PORT REMOVAL;  Surgeon: Leighton Ruff, MD;  [facility];  Service: General;  Laterality: N/A;  ? PORTACATH PLACEMENT Left 02/21/2019  ? Procedure: INSERTION PORT-A-CATH;  Surgeon: Johnathan Hausen, MD;  [facility];  Service: General;  Laterality: Left;  ? WISDOM TOOTH EXTRACTION    ? XI ROBOTIC ASSISTED LOWER ANTERIOR RESECTION N/A 10/15/2019  ? Procedure: XI ROBOTIC ASSISTED LOWER ANTERIOR RESECTION, RIGID PROCTOSCOPY;  Surgeon: Leighton Ruff, MD;  [facility];  Service: General;  Laterality: N/A;  ? XI ROBOTIC ASSISTED LOWER ANTERIOR RESECTION N/A 11/19/2020  ? Procedure: XI ROBOTIC ASSISTED REDO OF LOWER ANTERIOR RESECTION WITH ROBOTIC LYSIS OF ADHESIONS;  Surgeon: Leighton Ruff, MD;  [facility];  Service: General;   Laterality: N/A;  ? ? ?FAMILY HISTORY: ?Family History  ?Problem Relation Age of Onset  ? Heart attack Father   ? Sleep apnea Father   ? Cancer Paternal Grandmother   ? Cancer Paternal Grandfather   ? ? ?SOCIAL HISTORY: ?Social History  ? ?Socioeconomic History  ? Marital status: Married  ?  Spouse name: Not on file  ? Number of children: 2  ? Years of education: Not on file  ? Highest education level: Not on file  ?Occupational History  ? Not on file  ?Tobacco Use  ? Smoking status: Former  ?  Types: Cigarettes  ?  Quit date: 2005  ?  Years since quitting: 18.3  ? Smokeless tobacco: Never  ? Tobacco comments:  ?  quit over 10 years ago  ?Vaping Use  ? Vaping Use: Never used  ?Substance and Sexual Activity  ? Alcohol use: Yes  ?  Alcohol/week: 3.0 standard drinks  ?  Types: 3 Cans of beer per week  ?  Comment: 3-4 beers weekly  ? Drug use: No  ? Sexual activity: Yes  ?Other Topics Concern  ? Not on file  ?Social History Narrative  ? Not on file  ? ?Social Determinants of Health  ? ?Financial Resource Strain: Not on file  ?Food Insecurity: Not on file  ?Transportation Needs: Not on file  ?Physical Activity: Not on file  ?Stress: Not on file  ?Social Connections: Not on file  ?Intimate Partner Violence: Not on file  ? ? ? ? ?PHYSICAL EXAM ? ?Vitals:  ? 10/13/21 0751  ?BP: 123/83  ?Pulse: 62  ?Weight: 182 lb 9.6 oz (82.8 kg)  ?Height: 6' (1.829 m)  ? ?Body mass index is 24.77 kg/m?. ? ?Generalized: Well developed, in no acute distress  ?Chest: Lungs clear to auscultation bilaterally ? ?Neurological examination  ?Mentation: Alert oriented to time, place, history taking. Follows all commands speech and language fluent ?Cranial nerve II-XII: Extraocular movements were full, visual field were full on confrontational test Head turning and shoulder shrug  were normal and symmetric. ?Gait and station: Gait is normal.  ? ? ?DIAGNOSTIC DATA (LABS, IMAGING, TESTING) ?- I reviewed patient records, labs, notes, testing and imaging  myself where available. ? ?Lab Results  ?Component Value Date  ? WBC 8.3 11/26/2020  ? HGB 13.0 11/26/2020  ? HCT 39.1 11/26/2020  ? MCV 93.5 11/26/2020  ? PLT 278 11/26/2020  ? ?   ?Component Value Date/Time  ? NA 138 02/22/2021 0952  ? K 4.2 02/22/2021 0952  ? CL 103 02/22/2021 0952  ? CO2 27 02/22/2021 0952  ? GLUCOSE 104 (H) 02/22/2021 6812  ? BUN 16 02/22/2021 0952  ? CREATININE 1.03 02/22/2021 0952  ? CALCIUM 9.7 02/22/2021 0952  ? PROT 7.1 02/22/2021 0952  ? ALBUMIN 4.6 02/22/2021 0952  ? AST 28 02/22/2021 0952  ? ALT 38 02/22/2021 0952  ? ALKPHOS 65 02/22/2021 0952  ? BILITOT 0.7 02/22/2021 0952  ? GFRNONAA >60 02/22/2021 0952  ?  GFRAA >60 11/30/2019 0318  ? GFRAA >60 08/11/2019 1050  ? ?Lab Results  ?Component Value Date  ? TRIG 92 11/24/2019  ? ? ? ? ? ?ASSESSMENT AND PLAN ?46 y.o. year old male  has a past medical history of Hyperlipidemia, OSA on CPAP, Peripheral neuropathy, Rectal adenocarcinoma ([place]), and Right shoulder pain. here with: ? ?OSA on CPAP ? ?- CPAP compliance excellent ?- Good treatment of AHI  ?- Encourage patient to use CPAP nightly and > 4 hours each night ?- F/U in 1 year or sooner if needed ? ? ? ? ?Ward Givens, MSN, NP-C 10/13/2021, 8:01 AM ?[facility] ?[address], [address] ?[place], [place] [address] ?[phone] ? ? ?

## 2021-10-13 NOTE — Patient Instructions (Signed)
Continue using CPAP nightly and greater than 4 hours each night °If your symptoms worsen or you develop new symptoms please let us know.  ° °

## 2022-01-02 ENCOUNTER — Other Ambulatory Visit: Payer: Self-pay

## 2022-01-10 ENCOUNTER — Other Ambulatory Visit: Payer: Self-pay

## 2022-01-17 DIAGNOSIS — Z932 Ileostomy status: Secondary | ICD-10-CM | POA: Insufficient documentation

## 2022-02-17 DIAGNOSIS — Z85048 Personal history of other malignant neoplasm of rectum, rectosigmoid junction, and anus: Secondary | ICD-10-CM | POA: Insufficient documentation

## 2022-03-03 ENCOUNTER — Inpatient Hospital Stay: Payer: Commercial Managed Care - PPO | Attending: Oncology

## 2022-03-03 ENCOUNTER — Inpatient Hospital Stay (HOSPITAL_BASED_OUTPATIENT_CLINIC_OR_DEPARTMENT_OTHER): Payer: Commercial Managed Care - PPO | Admitting: Oncology

## 2022-03-03 ENCOUNTER — Encounter: Payer: Self-pay | Admitting: Oncology

## 2022-03-03 VITALS — BP 130/92 | HR 68 | Temp 98.2°F | Resp 20 | Ht 72.0 in | Wt 173.0 lb

## 2022-03-03 DIAGNOSIS — C19 Malignant neoplasm of rectosigmoid junction: Secondary | ICD-10-CM | POA: Insufficient documentation

## 2022-03-03 DIAGNOSIS — C20 Malignant neoplasm of rectum: Secondary | ICD-10-CM

## 2022-03-03 DIAGNOSIS — G620 Drug-induced polyneuropathy: Secondary | ICD-10-CM | POA: Insufficient documentation

## 2022-03-03 DIAGNOSIS — D1803 Hemangioma of intra-abdominal structures: Secondary | ICD-10-CM | POA: Insufficient documentation

## 2022-03-03 LAB — CEA (ACCESS): CEA (CHCC): <1 ng/mL (ref 0.00–5.00)

## 2022-03-03 NOTE — Progress Notes (Signed)
[facility] OFFICE PROGRESS NOTE   Diagnosis: Rectal cancer  INTERVAL HISTORY:   [name] returns as scheduled.  He underwent ileostomy takedown 01/17/2022.  He developed midline fascial dehiscence and underwent repeat surgery with placement of a wound VAC.  The wound VAC remains in place.  He reports his bowels are functioning.  He has minimal neuropathy symptoms remaining in the hands and feet.  He is scheduled for follow-up at [facility] next week.  Objective:  Vital signs in last 24 hours:  Blood pressure (!) 130/92, pulse 68, temperature 98.2 F (36.8 C), temperature source Oral, resp. rate 20, height 6' (1.829 m), weight 173 lb (78.5 kg), SpO2 100 %.    Lymphatics: No cervical, supraclavicular, axillary, or inguinal nodes Resp: Lungs clear bilaterally with decreased breath sounds at the right greater than left lower posterior chest, no respiratory distress Cardio: Regular rate and rhythm GI: No hepatosplenomegaly, midline wound VAC in place, healed ileostomy Vascular: No leg edema   Lab Results:  Lab Results  Component Value Date   WBC 8.3 11/26/2020   HGB 13.0 11/26/2020   HCT 39.1 11/26/2020   MCV 93.5 11/26/2020   PLT 278 11/26/2020   NEUTROABS 10.2 (H) 11/23/2020    CMP  Lab Results  Component Value Date   NA 138 02/22/2021   K 4.2 02/22/2021   CL 103 02/22/2021   CO2 27 02/22/2021   GLUCOSE 104 (H) 02/22/2021   BUN 16 02/22/2021   CREATININE 1.03 02/22/2021   CALCIUM 9.7 02/22/2021   PROT 7.1 02/22/2021   ALBUMIN 4.6 02/22/2021   AST 28 02/22/2021   ALT 38 02/22/2021   ALKPHOS 65 02/22/2021   BILITOT 0.7 02/22/2021   GFRNONAA >60 02/22/2021   GFRAA >60 11/30/2019    Lab Results  Component Value Date   CEA1 1.91 02/22/2021   CEA 2.03 09/01/2021     Medications: I have reviewed the patient's current medications.   Assessment/Plan: Rectosigmoid cancer  Colonoscopy by Dr. Benson Norway 02/04/2019-fungating infiltrative and ulcerated  nonobstructing large mass in the rectum.  The mass was circumferential measuring 3 cm in length.  There was no bleeding present.  The mass was 10 cm from the anal verge.  Biopsy showed invasive adenocarcinoma, moderately differentiated; MLH1, MSH2, MSH6 and PMS2 intact.   CTs abdomen and pelvis 02/04/2019-apparent colorectal mass measuring 3.9 x 5.4 x 3.2 cm involving the distal sigmoid colon and proximal rectum with adjacent borderline enlarged suspicious mesorectal lymph nodes; indeterminate lesion in segment 6 of the liver measuring 1.4 x 1.1 cm. MRI pelvis 02/13/2019- T3c, N1 tumor at 11.5 cm from the anal verge, 6.7 cm from the internal anal sphincter, 3 lymph nodes greater than 5 mm, largest 8 mm CT chest 02/13/2019-negative for metastatic disease MRI liver 02/13/2019- 3.6 cm hemangioma in the posterior right hepatic lobe, 10 mm indeterminate lesion in the posterior right hepatic lobe-potentially an atypical hemangioma- solitary metastasis not excluded Cycle 1 FOLFOX 02/26/2019 Cycle 2 FOLFOX 03/13/2019, Emend added Cycle 3 FOLFOX 03/27/2019 Cycle 4 FOLFOX 04/10/2019 Cycle 5 FOLFOX 04/24/2019 Cycle 6 FOLFOX 05/07/2019  Cycle 7 FOLFOX 05/22/2019 (5-fluorouracil dose reduced) Cycle 8 FOLFOX 06/09/2019 Capecitabine/radiation 06/30/2019-07/06/2019 Capecitabine placed on hold with the p.m. dose on 07/30/2019, 1000 mg twice daily Capecitabine resumed with the p.m. dose on 08/04/2019 with a dose reduction CT liver 08/11/2019-stable subcapsular hemangioma right hepatic lobe.  Stable small indeterminate right hepatic lobe lesion unchanged in size from CT 02/04/2019. 10/15/2019, robotic assisted low anterior resection/Port-A-Cath removal, adenocarcinoma  of distal sigmoid/proximal rectum invading pericolonic connective tissue, negative resection margins, 0/24 nodes,ypT3ypN0, treatment effect present-tumor associated fibrosis  CT abdomen/pelvis 10/31/2019-anastomotic breakdown at the rectum, extraluminal stool, right lower  quadrant abscess, small bowel obstruction, air within the bladder lumen, stable subcentimeter right hepatic nodule CT abdomen/pelvis 11/15/2019-stable 1 cm segment 7 lesion, no new liver lesions, anastomotic breakdown at the rectum with decreased size of complex surrounding gas/fluid collection in good position of transgluteal drain, persistent small bowel obstruction CT pelvis 02/27/2020-no measurable perianastomotic fluid collection or free air, no evidence of metastatic disease CTs 08/19/2020-stable 2 mm left upper lobe pulmonary nodule.  No other suspicious appearing pulmonary nodules or masses noted.  Cavernous hemangiomas in the liver, similar to the prior examinations.  Postoperative changes with what appears to be some postoperative scarring in the presacral space adjacent to the suture line at the rectosigmoid anastomosis. Redo low anterior resection 11/19/2020 for management of a stricture Evaluation by Dr. Wanda Plump at [facility] 01/10/2021-posterior anastomotic defect on exam at 7 cm, sigmoidoscopy confirmed a 2 cm wide anastomotic defect in the posterior midline CTs 02/22/2021-treatment related changes in the pelvis, reduction in conspicuity of perirectal fat planes, stable right hepatic lobe hemangiomas, stable pulmonary nodules, right abdominal ileostomy MRI pelvis 05/03/2021-postsurgical changes of prior low anterior resection without definite evidence of recurrent or metastatic disease in the pelvis.  Decrease in size of a loculated fluid collection in the presacral space, bordered posteriorly and laterally by T2 hypointense soft tissue representing likely chronic scarring/fibrosis which appears increased. CTs 08/12/2021-compared with 05/03/2021 MRI of the pelvis there is overall similar soft tissue in the presacral space surrounding the colorectal anastomosis at the site of prior contained perforation.  Unchanged hepatic segment 7 enhancing lesion previously characterized as hemangioma.  Unchanged  subcentimeter hypodensity hepatic segment 6.  Unchanged 0.3 cm right lower lobe lung nodule.  No new or enlarging nodules. Ileostomy takedown 01/17/2022 CT abdomen/pelvis 01/22/2022-dilated loops of small bowel with prominent wall thickening of ileal loops with tapering toward the anastomosis in the right mid abdomen, prominent mesenteric nodes, likely reactive, bilateral pleural effusions with atelectasis right greater than left Change in bowel habits/rectal pain secondary to #1, improved Port-A-Cath placement, Dr. Hassell Done, 02/21/2019 Early oxaliplatin neuropathy-trial of Neurontin initiated 08/25/2019 due to foot pain, improved Pain secondary to hand/foot syndrome, radiation skin toxicity, and radiation proctitis-improved Admission with intra-abdominal abscess/anastomotic dehiscence, surgery 11/25/2019- exploratory laparotomy, placement of wound VAC and drain, diverting loop ileostomy       Disposition: [name] has a history of rectal cancer.  He is now 3 years out from diagnosis.  He is in clinical remission.  We will follow-up on the CEA from today.  He will return for an office visit and CEA in 6 months.  He will continue follow-up at [facility] for postoperative care.  Betsy Coder, MD  03/03/2022  8:44 AM

## 2022-05-22 ENCOUNTER — Ambulatory Visit (INDEPENDENT_AMBULATORY_CARE_PROVIDER_SITE_OTHER): Payer: Commercial Managed Care - PPO | Admitting: Mental Health

## 2022-05-22 DIAGNOSIS — F411 Generalized anxiety disorder: Secondary | ICD-10-CM | POA: Diagnosis not present
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
## 2022-06-27 ENCOUNTER — Encounter: Payer: Self-pay | Admitting: Oncology

## 2022-07-04 ENCOUNTER — Ambulatory Visit: Payer: Commercial Managed Care - PPO | Admitting: Mental Health

## 2022-07-18 ENCOUNTER — Encounter: Payer: Self-pay | Admitting: Oncology

## 2022-07-18 ENCOUNTER — Ambulatory Visit (INDEPENDENT_AMBULATORY_CARE_PROVIDER_SITE_OTHER): Payer: Commercial Managed Care - PPO | Admitting: Mental Health

## 2022-07-18 DIAGNOSIS — F411 Generalized anxiety disorder: Secondary | ICD-10-CM

## 2022-07-18 NOTE — Progress Notes (Signed)
[facility] Counsel[facility] Initial Adult Exam  Name: Brandon Barry Date: 07/18/22 MRN: [id number] DOB: Oct 30, 1975 PCP: Pcp, No  Time spent: 51 minutes  Treatment:  ind. therapy  Mental Status Exam:    Appearance:    Casual     Behavior:   Appropriate  Motor:   WNL  Speech/Language:    Clear and Coherent  Affect:   Full range   Mood:   Euthymic  Thought process:   Logical, linear, goal directed  Thought content:     WNL  Sensory/Perceptual disturbances:     none  Orientation:   x4  Attention:   Good  Concentration:   Good  Memory:   Intact  Fund of knowledge:    Consistent with age and development  Insight:     Good  Judgment:    Good  Impulse Control:   Good     Reported Symptoms:  daily anxiety due to excessive worry, restlessness, difficulty with concentration, irritability hypervigilance  Risk Assessment: Danger to Self:  No Self-injurious Behavior: No Danger to Others: No Duty to Warn:no Physical Aggression / Violence:No  Access to Firearms a concern: No  Gang Involvement:No  Patient / guardian was educated about steps to take if suicide or homicide risk level increases between visits: yes While future psychiatric events cannot be accurately predicted, the patient does not currently require acute inpatient psychiatric care and does not currently meet [facility] involuntary commitment criteria.    Subjective:  Patient arrived on time for today's session.  Continue to identify needs and assess relevant history.  Patient shared how his anxiety affects his relationships.  Stated his wife reports she will notice irritability, patient stated that he has difficulty when he has to test to complete to not feel a sense of urgency even when he has time to complete them.  He provided more history related to family growing up, his parents having difficulty, witnessing their arguing.  Learning later in adolescence that his mother was unfaithful to her father.  Reports his father  coped with anxiety.  Reports today have had challenges in their marriage but currently reunited and remarried after divorcing a few years ago.  Facilitated his identifying associated thoughts with his anxiety presently, as well as insights into past experiences and how they may have reinforced some present-day behaviors.  He also reports instances where his anxiety is elevated, with some associated symptoms which appear to be close to having full panic episodes.  Interventions: Further assessment, supportive therapy, CBT  Diagnoses:    ICD-10-CM   1. GAD (generalized anxiety disorder)  F41.1         Plan: Patient is to use CBT, mindfulness and coping skills to help manage / decrease symptoms.  Patient to continue to utilize his support system.    Long-term goal:  Reduce overall level, frequency, and intensity of the feelings of anxiety, panic for up to 3 months consecutively.  Short-term goal: Patient to identify and process past events that contribute to his ongoing anxiety.                   Patient to identify associated thoughts with his anxiety and work to reframe.                              Patient to decrease instances of irritability associated with his anxiety.  Assessment of progress:  progressing     Harrell Gave  Rosana Berger, [facility]

## 2022-08-01 ENCOUNTER — Ambulatory Visit (INDEPENDENT_AMBULATORY_CARE_PROVIDER_SITE_OTHER): Payer: Commercial Managed Care - PPO | Admitting: Mental Health

## 2022-08-01 DIAGNOSIS — F411 Generalized anxiety disorder: Secondary | ICD-10-CM | POA: Diagnosis not present

## 2022-08-01 NOTE — Progress Notes (Signed)
[facility] Psychotherapy Note  Name: LADARRIAN GIFFEN III Date: 08/01/22 MRN: [id number] DOB: 1975/11/05 PCP: Pcp, No  Time spent: 52 minutes  Treatment:  ind. therapy  Mental Status Exam:    Appearance:    Casual     Behavior:   Appropriate  Motor:   WNL  Speech/Language:    Clear and Coherent  Affect:   Full range   Mood:   Euthymic  Thought process:   Logical, linear, goal directed  Thought content:     WNL  Sensory/Perceptual disturbances:     none  Orientation:   x4  Attention:   Good  Concentration:   Good  Memory:   Intact  Fund of knowledge:    Consistent with age and development  Insight:     Good  Judgment:    Good  Impulse Control:   Good     Reported Symptoms:  daily anxiety due to excessive worry, restlessness, difficulty with concentration, irritability hypervigilance  Risk Assessment: Danger to Self:  No Self-injurious Behavior: No Danger to Others: No Duty to Warn:no Physical Aggression / Violence:No  Access to Firearms a concern: No  Gang Involvement:No  Patient / guardian was educated about steps to take if suicide or homicide risk level increases between visits: yes While future psychiatric events cannot be accurately predicted, the patient does not currently require acute inpatient psychiatric care and does not currently meet [facility] involuntary commitment criteria.    Subjective:  Patient arrived on time for today's session.  Discussed recent events and progress.  Patient shared how he is doing well at work, recently was given some compliments from other coworkers.  He stated that he also recently had some disturbing dreams.  He had difficulty identifying specifics however, stated that he feels they could be stress related.  He continues to use his CPAP machine and reports it has been helpful in getting quality sleep.  Facilitated his identifying areas with which he feels he needs to change to better manage his anxiety.  He stated that  he has an interest in gardening and has had various guardians over the past several years.  He identified how he needs to not overwhelm himself by getting too overly involved while also identifying how it is enjoyable. He plans to look at other areas that resulted in him feeling like he has to "overdo it" which increases his stress levels.  Interventions:  supportive therapy, CBT  Diagnoses:    ICD-10-CM   1. GAD (generalized anxiety disorder)  F41.1          Plan: Patient is to use CBT, mindfulness and coping skills to help manage / decrease symptoms.  Patient to continue to utilize his support system.    Long-term goal:  Reduce overall level, frequency, and intensity of the feelings of anxiety, panic for up to 3 months consecutively.  Short-term goal: Patient to identify and process past events that contribute to his ongoing anxiety.                   Patient to identify associated thoughts with his anxiety and work to reframe.                              Patient to decrease instances of irritability associated with his anxiety.  Assessment of progress:  progressing     Anson Oregon, [facility]

## 2022-08-30 ENCOUNTER — Ambulatory Visit: Payer: Commercial Managed Care - PPO | Admitting: Mental Health

## 2022-09-01 ENCOUNTER — Encounter: Payer: Self-pay | Admitting: Nurse Practitioner

## 2022-09-01 ENCOUNTER — Inpatient Hospital Stay (HOSPITAL_BASED_OUTPATIENT_CLINIC_OR_DEPARTMENT_OTHER): Payer: Commercial Managed Care - PPO | Admitting: Nurse Practitioner

## 2022-09-01 ENCOUNTER — Inpatient Hospital Stay: Payer: Commercial Managed Care - PPO | Attending: Nurse Practitioner

## 2022-09-01 VITALS — BP 140/92 | HR 80 | Temp 98.2°F | Resp 18 | Wt 185.0 lb

## 2022-09-01 DIAGNOSIS — K651 Peritoneal abscess: Secondary | ICD-10-CM | POA: Diagnosis not present

## 2022-09-01 DIAGNOSIS — C20 Malignant neoplasm of rectum: Secondary | ICD-10-CM | POA: Diagnosis not present

## 2022-09-01 DIAGNOSIS — C19 Malignant neoplasm of rectosigmoid junction: Secondary | ICD-10-CM | POA: Insufficient documentation

## 2022-09-01 DIAGNOSIS — G893 Neoplasm related pain (acute) (chronic): Secondary | ICD-10-CM | POA: Insufficient documentation

## 2022-09-01 DIAGNOSIS — G620 Drug-induced polyneuropathy: Secondary | ICD-10-CM | POA: Insufficient documentation

## 2022-09-01 LAB — CEA (ACCESS): CEA (CHCC): 1.7 ng/mL (ref 0.00–5.00)

## 2022-09-01 NOTE — Progress Notes (Signed)
[place] OFFICE PROGRESS NOTE   Diagnosis:  Rectal cancer  INTERVAL HISTORY:   Mr. Voltaire returns as scheduled.  He feels well.  He does a daily morning enema to maintain regular bowel habits.  No abdominal pain.  He has a good appetite.  He reports a surveillance colonoscopy is planned in the near future.  Objective:  Vital signs in last 24 hours:  Blood pressure (!) 140/92, pulse 80, temperature 98.2 F (36.8 C), temperature source Oral, resp. rate 18, weight 185 lb (83.9 kg), SpO2 100 %.    Lymphatics: No palpable cervical, supraclavicular, axillary or inguinal lymph nodes. Resp: Lungs clear bilaterally.   Cardio: Regular rate and rhythm. GI: Abdomen soft and nontender.  No hepatosplenomegaly.  Healed midline scar.  Midline hernia. Vascular: No leg edema.   Lab Results:  Lab Results  Component Value Date   WBC 8.3 11/26/2020   HGB 13.0 11/26/2020   HCT 39.1 11/26/2020   MCV 93.5 11/26/2020   PLT 278 11/26/2020   NEUTROABS 10.2 (H) 11/23/2020    Imaging:  No results found.  Medications: I have reviewed the patient's current medications.  Assessment/Plan: Rectosigmoid cancer  Colonoscopy by Dr. Benson Norway 02/04/2019-fungating infiltrative and ulcerated nonobstructing large mass in the rectum.  The mass was circumferential measuring 3 cm in length.  There was no bleeding present.  The mass was 10 cm from the anal verge.  Biopsy showed invasive adenocarcinoma, moderately differentiated; MLH1, MSH2, MSH6 and PMS2 intact.   CTs abdomen and pelvis 02/04/2019-apparent colorectal mass measuring 3.9 x 5.4 x 3.2 cm involving the distal sigmoid colon and proximal rectum with adjacent borderline enlarged suspicious mesorectal lymph nodes; indeterminate lesion in segment 6 of the liver measuring 1.4 x 1.1 cm. MRI pelvis 02/13/2019- T3c, N1 tumor at 11.5 cm from the anal verge, 6.7 cm from the internal anal sphincter, 3 lymph nodes greater than 5 mm, largest 8 mm CT chest  02/13/2019-negative for metastatic disease MRI liver 02/13/2019- 3.6 cm hemangioma in the posterior right hepatic lobe, 10 mm indeterminate lesion in the posterior right hepatic lobe-potentially an atypical hemangioma- solitary metastasis not excluded Cycle 1 FOLFOX 02/26/2019 Cycle 2 FOLFOX 03/13/2019, Emend added Cycle 3 FOLFOX 03/27/2019 Cycle 4 FOLFOX 04/10/2019 Cycle 5 FOLFOX 04/24/2019 Cycle 6 FOLFOX 05/07/2019  Cycle 7 FOLFOX 05/22/2019 (5-fluorouracil dose reduced) Cycle 8 FOLFOX 06/09/2019 Capecitabine/radiation 06/30/2019-07/06/2019 Capecitabine placed on hold with the p.m. dose on 07/30/2019, 1000 mg twice daily Capecitabine resumed with the p.m. dose on 08/04/2019 with a dose reduction CT liver 08/11/2019-stable subcapsular hemangioma right hepatic lobe.  Stable small indeterminate right hepatic lobe lesion unchanged in size from CT 02/04/2019. 10/15/2019, robotic assisted low anterior resection/Port-A-Cath removal, adenocarcinoma of distal sigmoid/proximal rectum invading pericolonic connective tissue, negative resection margins, 0/24 nodes,[id number], treatment effect present-tumor associated fibrosis  CT abdomen/pelvis 10/31/2019-anastomotic breakdown at the rectum, extraluminal stool, right lower quadrant abscess, small bowel obstruction, air within the bladder lumen, stable subcentimeter right hepatic nodule CT abdomen/pelvis 11/15/2019-stable 1 cm segment 7 lesion, no new liver lesions, anastomotic breakdown at the rectum with decreased size of complex surrounding gas/fluid collection in good position of transgluteal drain, persistent small bowel obstruction CT pelvis 02/27/2020-no measurable perianastomotic fluid collection or free air, no evidence of metastatic disease CTs 08/19/2020-stable 2 mm left upper lobe pulmonary nodule.  No other suspicious appearing pulmonary nodules or masses noted.  Cavernous hemangiomas in the liver, similar to the prior examinations.  Postoperative changes with what  appears to be some postoperative scarring  in the presacral space adjacent to the suture line at the rectosigmoid anastomosis. Redo low anterior resection 11/19/2020 for management of a stricture Evaluation by Dr. Wanda Plump at [facility] 01/10/2021-posterior anastomotic defect on exam at 7 cm, sigmoidoscopy confirmed a 2 cm wide anastomotic defect in the posterior midline CTs 02/22/2021-treatment related changes in the pelvis, reduction in conspicuity of perirectal fat planes, stable right hepatic lobe hemangiomas, stable pulmonary nodules, right abdominal ileostomy MRI pelvis 05/03/2021-postsurgical changes of prior low anterior resection without definite evidence of recurrent or metastatic disease in the pelvis.  Decrease in size of a loculated fluid collection in the presacral space, bordered posteriorly and laterally by T2 hypointense soft tissue representing likely chronic scarring/fibrosis which appears increased. CTs 08/12/2021-compared with 05/03/2021 MRI of the pelvis there is overall similar soft tissue in the presacral space surrounding the colorectal anastomosis at the site of prior contained perforation.  Unchanged hepatic segment 7 enhancing lesion previously characterized as hemangioma.  Unchanged subcentimeter hypodensity hepatic segment 6.  Unchanged 0.3 cm right lower lobe lung nodule.  No new or enlarging nodules. Ileostomy takedown 01/17/2022 CT abdomen/pelvis 01/22/2022-dilated loops of small bowel with prominent wall thickening of ileal loops with tapering toward the anastomosis in the right mid abdomen, prominent mesenteric nodes, likely reactive, bilateral pleural effusions with atelectasis right greater than left Change in bowel habits/rectal pain secondary to #1, improved Port-A-Cath placement, Dr. Hassell Done, 02/21/2019 Early oxaliplatin neuropathy-trial of Neurontin initiated 08/25/2019 due to foot pain, improved Pain secondary to hand/foot syndrome, radiation skin toxicity, and radiation  proctitis-improved Admission with intra-abdominal abscess/anastomotic dehiscence, surgery 11/25/2019- exploratory laparotomy, placement of wound VAC and drain, diverting loop ileostomy      Disposition: Brandon Barry remains in clinical remission from rectal cancer.  We will follow-up on the CEA from today.  He is now 3-1/2 years out from diagnosis.  We will continue to follow on a 39-month schedule with a CEA.  He agrees with this plan.    Ned Card ANP/GNP-BC   09/01/2022  8:08 AM

## 2022-09-12 ENCOUNTER — Ambulatory Visit (INDEPENDENT_AMBULATORY_CARE_PROVIDER_SITE_OTHER): Payer: Commercial Managed Care - PPO | Admitting: Mental Health

## 2022-09-12 DIAGNOSIS — F411 Generalized anxiety disorder: Secondary | ICD-10-CM | POA: Diagnosis not present

## 2022-09-12 NOTE — Progress Notes (Unsigned)
[facility] Counsel[facility] Psychotherapy Note  Name: Brandon Barry Date: 09/12/22 MRN: [id number] DOB: 1975/09/18 PCP: Pcp, No  Time spent: 51 minutes  Treatment:  ind. therapy  Mental Status Exam:    Appearance:    Casual     Behavior:   Appropriate  Motor:   WNL  Speech/Language:    Clear and Coherent  Affect:   Full range   Mood:   Euthymic  Thought process:   Logical, linear, goal directed  Thought content:     WNL  Sensory/Perceptual disturbances:     none  Orientation:   x4  Attention:   Good  Concentration:   Good  Memory:   Intact  Fund of knowledge:    Consistent with age and development  Insight:     Good  Judgment:    Good  Impulse Control:   Good     Reported Symptoms:  daily anxiety due to excessive worry, restlessness, difficulty with concentration, irritability hypervigilance  Risk Assessment: Danger to Self:  No Self-injurious Behavior: No Danger to Others: No Duty to Warn:no Physical Aggression / Violence:No  Access to Firearms a concern: No  Gang Involvement:No  Patient / guardian was educated about steps to take if suicide or homicide risk level increases between visits: yes While future psychiatric events cannot be accurately predicted, the patient does not currently require acute inpatient psychiatric care and does not currently meet [facility] involuntary commitment criteria.    Subjective:  Patient arrived on time for today's session.  Assess progress since last visit which was about 2 months ago.  Patient shared his cancer journey, getting diagnosed, obtaining treatment and its impact on him emotionally and his family.  He shared efforts in trying to manage his stress levels, not putting too much pressure on himself, allowing himself to work to recognize when this occurs.  Facilitated his identifying ways he has tried to be resourceful in this effort, continue to work with him from a cognitive behavioral framework toward identifying stressors and  working to reframe them.  He stated this been particularly helpful as over the past few weeks he and his family have been able to go on a vacation trip.  Explored ways to continue to cope and care for himself, getting adequate rest, utilizing his CPAP machine consistently, attending doctor appointments.   Interventions:  supportive therapy, CBT  Diagnoses:  No diagnosis found.      Plan: Patient is to use CBT, mindfulness and coping skills to help manage / decrease symptoms.  Patient to continue to utilize his support system.    Long-term goal:  Reduce overall level, frequency, and intensity of the feelings of anxiety, panic for up to 3 months consecutively.  Short-term goal: Patient to identify and process past events that contribute to his ongoing anxiety.                   Patient to identify associated thoughts with his anxiety and work to reframe.                              Patient to decrease instances of irritability associated with his anxiety.  Assessment of progress:  progressing     Anson Oregon, [facility]

## 2022-09-26 ENCOUNTER — Ambulatory Visit: Payer: Commercial Managed Care - PPO | Admitting: Mental Health

## 2022-10-23 ENCOUNTER — Encounter: Payer: Self-pay | Admitting: *Deleted

## 2022-10-23 NOTE — Progress Notes (Unsigned)
PATIENT: Brandon Barry DOB: 07-May-1976  REASON FOR VISIT: follow up HISTORY FROM: patient PRIMARY NEUROLOGIST:   Virtual Visit via Video Note  I connected with Brandon Barry on 10/24/22 at  1:00 PM EDT by a video enabled telemedicine application located remotely at [facility] and verified that I am speaking with the correct person using two identifiers who was located at their own home.   I discussed the limitations of evaluation and management by telemedicine and the availability of in person appointments. The patient expressed understanding and agreed to proceed.   PATIENT: Brandon Barry DOB: Oct 09, 1975  REASON FOR VISIT: follow up HISTORY FROM: patient      HISTORY OF PRESENT ILLNESS: Today 10/24/22:  Brandon Barry is a 47 y.o. male with a history of OSA on CPAP. Returns today for follow-up.  Reports that CPAP is working well.  Continues to notice the benefit. download is below.       REVIEW OF SYSTEMS: Out of a complete 14 system review of symptoms, the patient complains only of the following symptoms, and all other reviewed systems are negative.  ALLERGIES: No Known Allergies  HOME MEDICATIONS: Outpatient Medications Prior to Visit  Medication Sig Dispense Refill   escitalopram (LEXAPRO) 20 MG tablet Take 1 tablet by mouth daily.     No facility-administered medications prior to visit.    PAST MEDICAL HISTORY: Past Medical History:  Diagnosis Date   Hyperlipidemia    OSA on CPAP    Peripheral neuropathy    Hands and feet from Chemo   Rectal adenocarcinoma (HCC)    Right shoulder pain     PAST SURGICAL HISTORY: Past Surgical History:  Procedure Laterality Date   APPLICATION OF WOUND VAC N/A 11/25/2019   Procedure: APPLICATION OF WOUND VAC AND DRAIN PLACEMENT;  Surgeon: Romie Levee, MD;  Location: [facility];  Service: General;  Laterality: N/A;   BALLOON DILATION N/A 06/03/2020   Procedure: [place]ye [place];   Surgeon: Jeani Hawking, MD;  Location: [facility];  Service: Endoscopy;  Laterality: N/A;   BALLOON DILATION N/A 07/30/2020   Procedure: BALLOON DILATION;  Surgeon: Jeani Hawking, MD;  Location: [facility];  Service: Endoscopy;  Laterality: N/A;   COLONOSCOPY  02/04/2019   FLEXIBLE SIGMOIDOSCOPY N/A 04/22/2020   Procedure: FLEXIBLE SIGMOIDOSCOPY;  Surgeon: Romie Levee, MD;  Location: [facility];  Service: Endoscopy;  Laterality: N/A;   FLEXIBLE SIGMOIDOSCOPY N/A 06/03/2020   Procedure: FLEXIBLE SIGMOIDOSCOPY;  Surgeon: Jeani Hawking, MD;  Location: [facility];  Service: Endoscopy;  Laterality: N/A;   FLEXIBLE SIGMOIDOSCOPY N/A 07/30/2020   Procedure: FLEXIBLE SIGMOIDOSCOPY;  Surgeon: Jeani Hawking, MD;  [facility];  Service: Endoscopy;  Laterality: N/A;   ILEOSTOMY Right 11/25/2019   Procedure: PLACEMENT OF ILEOSTOMY;  Surgeon: Romie Levee, MD;  Location: [facility];  Service: General;  Laterality: Right;   IR CATHETER TUBE CHANGE  11/11/2019   IR PATIENT EVAL TECH 0-60 MINS  12/22/2019   KNEE ARTHROSCOPY Left 11/19/2018   LAPAROTOMY N/A 11/25/2019   Procedure: Joanne Gavel LAPAROTMY WITH OPEN PELVIC WASHOUT;  Surgeon: Romie Levee, MD;  Location: [facility];  Service: General;  Laterality: N/A;   PORT-A-CATH REMOVAL N/A 10/15/2019   Procedure: PORT REMOVAL;  Surgeon: Romie Levee, MD;  Location: [facility];  Service: General;  Laterality: N/A;   PORTACATH PLACEMENT Left 02/21/2019   Procedure: INSERTION PORT-A-CATH;  Surgeon: Luretha Murphy, MD;  Location: [facility];  Service: General;  Laterality:  Left;   WISDOM TOOTH EXTRACTION     XI ROBOTIC ASSISTED LOWER ANTERIOR RESECTION N/A 10/15/2019   Procedure: XI ROBOTIC ASSISTED LOWER ANTERIOR RESECTION, RIGID PROCTOSCOPY;  Surgeon: Romie Levee, MD;  Location: [facility];  Service: General;  Laterality: N/A;   XI ROBOTIC ASSISTED LOWER ANTERIOR RESECTION N/A 11/19/2020   Procedure: XI ROBOTIC ASSISTED REDO OF LOWER ANTERIOR RESECTION WITH  ROBOTIC LYSIS OF ADHESIONS;  Surgeon: Romie Levee, MD;  Location: [facility];  Service: General;  Laterality: N/A;    FAMILY HISTORY: Family History  Problem Relation Age of Onset   Heart attack Father    Sleep apnea Father    Cancer Paternal Grandmother    Cancer Paternal Grandfather     SOCIAL HISTORY: Social History   Socioeconomic History   Marital status: Married    Spouse name: Not on file   Number of children: 2   Years of education: Not on file   Highest education level: Not on file  Occupational History   Not on file  Tobacco Use   Smoking status: Former    Types: Cigarettes    Quit date: 2005    Years since quitting: 19.3   Smokeless tobacco: Never   Tobacco comments:    quit over 10 years ago  Vaping Use   Vaping Use: Never used  Substance and Sexual Activity   Alcohol use: Yes    Alcohol/week: 3.0 standard drinks of alcohol    Types: 3 Cans of beer per week    Comment: 3-4 beers weekly   Drug use: No   Sexual activity: Yes  Other Topics Concern   Not on file  Social History Narrative   Not on file   Social Determinants of Health   Financial Resource Strain: Not on file  Food Insecurity: Not on file  Transportation Needs: Not on file  Physical Activity: Not on file  Stress: Not on file  Social Connections: Not on file  Intimate Partner Violence: Not on file      PHYSICAL EXAM Generalized: Well developed, in no acute distress   Neurological examination  Mentation: Alert oriented to time, place, history taking. Follows all commands speech and language fluent Cranial nerve II-XII: Facial symmetry noted  DIAGNOSTIC DATA (LABS, IMAGING, TESTING) - I reviewed patient records, labs, notes, testing and imaging myself where available.  Lab Results  Component Value Date   WBC 8.3 11/26/2020   HGB 13.0 11/26/2020   HCT 39.1 11/26/2020   MCV 93.5 11/26/2020   PLT 278 11/26/2020      Component Value Date/Time   NA 138 02/22/2021 0952   K 4.2  02/22/2021 0952   CL 103 02/22/2021 0952   CO2 27 02/22/2021 0952   GLUCOSE 104 (H) 02/22/2021 0952   BUN 16 02/22/2021 0952   CREATININE 1.03 02/22/2021 0952   CALCIUM 9.7 02/22/2021 0952   PROT 7.1 02/22/2021 0952   ALBUMIN 4.6 02/22/2021 0952   AST 28 02/22/2021 0952   ALT 38 02/22/2021 0952   ALKPHOS 65 02/22/2021 0952   BILITOT 0.7 02/22/2021 0952   GFRNONAA >60 02/22/2021 0952   GFRAA >60 11/30/2019 0318   GFRAA >60 08/11/2019 1050   Lab Results  Component Value Date   TRIG 92 11/24/2019      ASSESSMENT AND PLAN 47 y.o. year old male  has a past medical history of Hyperlipidemia, OSA on CPAP, Peripheral neuropathy, Rectal adenocarcinoma (HCC), and Right shoulder pain. here with:  OSA on CPAP  CPAP  compliance excellent Residual AHI is good Encouraged patient to continue using CPAP nightly and > 4 hours each night F/U in 1 year or sooner if needed    Butch Penny, MSN, NP-C 10/24/2022, 12:56 PM [facility] [address], [address] [place], [place] [address] [phone]

## 2022-10-24 ENCOUNTER — Telehealth (INDEPENDENT_AMBULATORY_CARE_PROVIDER_SITE_OTHER): Payer: Commercial Managed Care - PPO | Admitting: Adult Health

## 2022-10-24 DIAGNOSIS — G4733 Obstructive sleep apnea (adult) (pediatric): Secondary | ICD-10-CM

## 2022-11-27 ENCOUNTER — Emergency Department (HOSPITAL_COMMUNITY): Payer: Commercial Managed Care - PPO

## 2022-11-27 ENCOUNTER — Encounter (HOSPITAL_COMMUNITY): Payer: Self-pay

## 2022-11-27 ENCOUNTER — Other Ambulatory Visit: Payer: Self-pay

## 2022-11-27 ENCOUNTER — Inpatient Hospital Stay (HOSPITAL_COMMUNITY)
Admission: EM | Admit: 2022-11-27 | Discharge: 2022-11-28 | DRG: 390 | Disposition: A | Discharge status: Home / Self Care | Payer: Commercial Managed Care - PPO | Attending: Surgery | Admitting: Surgery

## 2022-11-27 DIAGNOSIS — Z79899 Other long term (current) drug therapy: Secondary | ICD-10-CM | POA: Diagnosis not present

## 2022-11-27 DIAGNOSIS — Z85048 Personal history of other malignant neoplasm of rectum, rectosigmoid junction, and anus: Secondary | ICD-10-CM | POA: Diagnosis not present

## 2022-11-27 DIAGNOSIS — Z87891 Personal history of nicotine dependence: Secondary | ICD-10-CM

## 2022-11-27 DIAGNOSIS — K565 Intestinal adhesions [bands], unspecified as to partial versus complete obstruction: Principal | ICD-10-CM | POA: Diagnosis present

## 2022-11-27 DIAGNOSIS — G4733 Obstructive sleep apnea (adult) (pediatric): Secondary | ICD-10-CM | POA: Diagnosis present

## 2022-11-27 DIAGNOSIS — K56609 Unspecified intestinal obstruction, unspecified as to partial versus complete obstruction: Principal | ICD-10-CM | POA: Diagnosis present

## 2022-11-27 DIAGNOSIS — Z809 Family history of malignant neoplasm, unspecified: Secondary | ICD-10-CM

## 2022-11-27 DIAGNOSIS — G622 Polyneuropathy due to other toxic agents: Secondary | ICD-10-CM | POA: Diagnosis present

## 2022-11-27 DIAGNOSIS — Z8249 Family history of ischemic heart disease and other diseases of the circulatory system: Secondary | ICD-10-CM

## 2022-11-27 DIAGNOSIS — T451X5A Adverse effect of antineoplastic and immunosuppressive drugs, initial encounter: Secondary | ICD-10-CM | POA: Diagnosis present

## 2022-11-27 DIAGNOSIS — E785 Hyperlipidemia, unspecified: Secondary | ICD-10-CM | POA: Diagnosis present

## 2022-11-27 DIAGNOSIS — R109 Unspecified abdominal pain: Secondary | ICD-10-CM | POA: Diagnosis present

## 2022-11-27 LAB — URINALYSIS, ROUTINE W REFLEX MICROSCOPIC
Bilirubin Urine: NEGATIVE
Glucose, UA: NEGATIVE mg/dL
Hgb urine dipstick: NEGATIVE
Ketones, ur: NEGATIVE mg/dL
Leukocytes,Ua: NEGATIVE
Nitrite: NEGATIVE
Protein, ur: NEGATIVE mg/dL
Specific Gravity, Urine: 1.024 (ref 1.005–1.030)
pH: 5 (ref 5.0–8.0)

## 2022-11-27 LAB — COMPREHENSIVE METABOLIC PANEL
ALT: 27 U/L (ref 0–44)
AST: 25 U/L (ref 15–41)
Albumin: 4.2 g/dL (ref 3.5–5.0)
Alkaline Phosphatase: 67 U/L (ref 38–126)
Anion gap: 11 (ref 5–15)
BUN: 13 mg/dL (ref 6–20)
CO2: 23 mmol/L (ref 22–32)
Calcium: 9 mg/dL (ref 8.9–10.3)
Chloride: 105 mmol/L (ref 98–111)
Creatinine, Ser: 0.97 mg/dL (ref 0.61–1.24)
GFR, Estimated: >60 mL/min (ref >60)
Glucose, Bld: 103 mg/dL — ABNORMAL HIGH (ref 70–99)
Potassium: 4.1 mmol/L (ref 3.5–5.1)
Sodium: 139 mmol/L (ref 135–145)
Total Bilirubin: 1 mg/dL (ref 0.3–1.2)
Total Protein: 7.2 g/dL (ref 6.5–8.1)

## 2022-11-27 LAB — LACTIC ACID, PLASMA: Lactic Acid, Venous: 1.3 mmol/L (ref 0.5–1.9)

## 2022-11-27 LAB — CBC
HCT: 44.8 % (ref 39.0–52.0)
Hemoglobin: 14.6 g/dL (ref 13.0–17.0)
MCH: 28.7 pg (ref 26.0–34.0)
MCHC: 32.6 g/dL (ref 30.0–36.0)
MCV: 88.2 fL (ref 80.0–100.0)
Platelets: 293 10*3/uL (ref 150–400)
RBC: 5.08 MIL/uL (ref 4.22–5.81)
RDW: 14.5 % (ref 11.5–15.5)
WBC: 5.7 10*3/uL (ref 4.0–10.5)
nRBC: 0 % (ref 0.0–0.2)

## 2022-11-27 LAB — LIPASE, BLOOD: Lipase: 31 U/L (ref 11–51)

## 2022-11-27 MED ORDER — IOHEXOL 300 MG/ML  SOLN
100.0000 mL | Freq: Once | INTRAMUSCULAR | Status: AC | PRN
  Administered 2022-11-27: 100 mL via INTRAVENOUS

## 2022-11-27 MED ORDER — ONDANSETRON 4 MG PO TBDP: 4.0000 mg | ORAL_TABLET | Freq: Four times a day (QID) | ORAL | Status: DC | PRN

## 2022-11-27 MED ORDER — HYDROMORPHONE HCL 1 MG/ML IJ SOLN
1.0000 mg | INTRAMUSCULAR | Status: DC | PRN
  Administered 2022-11-27 – 2022-11-28 (×6): 1 mg via INTRAVENOUS
  Filled 2022-11-27 (×6): qty 1

## 2022-11-27 MED ORDER — HYDROMORPHONE HCL 1 MG/ML IJ SOLN
1.0000 mg | Freq: Once | INTRAMUSCULAR | Status: AC
  Administered 2022-11-27: 1 mg via INTRAVENOUS
  Filled 2022-11-27: qty 1

## 2022-11-27 MED ORDER — ONDANSETRON HCL 4 MG/2ML IJ SOLN
4.0000 mg | Freq: Once | INTRAMUSCULAR | Status: AC
  Administered 2022-11-27: 4 mg via INTRAVENOUS
  Filled 2022-11-27: qty 2

## 2022-11-27 MED ORDER — DIPHENHYDRAMINE HCL 50 MG/ML IJ SOLN: 25.0000 mg | Freq: Four times a day (QID) | INTRAMUSCULAR | Status: DC | PRN

## 2022-11-27 MED ORDER — ONDANSETRON HCL 4 MG/2ML IJ SOLN
4.0000 mg | Freq: Four times a day (QID) | INTRAMUSCULAR | Status: DC | PRN
  Administered 2022-11-28: 4 mg via INTRAVENOUS
  Filled 2022-11-27: qty 2

## 2022-11-27 MED ORDER — DIATRIZOATE MEGLUMINE & SODIUM 66-10 % PO SOLN
90.0000 mL | Freq: Once | ORAL | Status: AC
  Administered 2022-11-27: 90 mL via NASOGASTRIC
  Filled 2022-11-27: qty 90

## 2022-11-27 MED ORDER — ENOXAPARIN SODIUM 40 MG/0.4ML IJ SOSY
40.0000 mg | PREFILLED_SYRINGE | INTRAMUSCULAR | Status: DC
  Administered 2022-11-27: 40 mg via SUBCUTANEOUS
  Filled 2022-11-27: qty 0.4

## 2022-11-27 MED ORDER — DIPHENHYDRAMINE HCL 25 MG PO CAPS: 25.0000 mg | ORAL_CAPSULE | Freq: Four times a day (QID) | ORAL | Status: DC | PRN

## 2022-11-27 MED ORDER — POTASSIUM CHLORIDE IN NACL 20-0.9 MEQ/L-% IV SOLN
INTRAVENOUS | Status: DC
  Filled 2022-11-27 (×3): qty 1000

## 2022-11-27 NOTE — ED Notes (Signed)
ED TO INPATIENT HANDOFF REPORT  ED Nurse Name and Phone #: Linus Orn Name/Age/Gender Brandon Barry 47 y.o. male Room/Bed: WA12/WA12  Code Status   Code Status: Full Code  Home/SNF/Other Home Patient oriented to: self, place, time, and situation Is this baseline? Yes   Triage Complete: Triage complete  Chief Complaint SBO (small bowel obstruction) (HCC) [K56.609]  Triage Note Pt is coming in today complaining of RUQ pain, and nausea. Normal bowel movement for pt this morning.   Allergies No Known Allergies  Level of Care/Admitting Diagnosis ED Disposition     ED Disposition  Admit   Condition  --   Comment  Hospital Area: [facility] [[id number]]  Level of Care: Med-Surg [16]  May admit patient to [facility] or [facility] if equivalent level of care is available:: No  Covid Evaluation: Asymptomatic - no recent exposure (last 10 days) testing not required  Diagnosis: SBO (small bowel obstruction) [facility]) [[id number]]  Admitting Physician: Abigail Miyamoto [[id number]]  Attending Physician: CCS, MD [[id number]]  Certification:: I certify this patient will need inpatient services for at least 2 midnights  Estimated Length of Stay: 3          B Medical/Surgery History Past Medical History:  Diagnosis Date   Hyperlipidemia    OSA on CPAP    Peripheral neuropathy    Hands and feet from Chemo   Rectal adenocarcinoma (HCC)    Right shoulder pain    Past Surgical History:  Procedure Laterality Date   APPLICATION OF WOUND VAC N/A 11/25/2019   Procedure: APPLICATION OF WOUND VAC AND DRAIN PLACEMENT;  Surgeon: Romie Levee, MD;  [facility];  Service: General;  Laterality: N/A;   BALLOON DILATION N/A 06/03/2020   Procedure: Rubye Beach;  Surgeon: Jeani Hawking, MD;  [facility];  Service: Endoscopy;  Laterality: N/A;   BALLOON DILATION N/A 07/30/2020   Procedure: BALLOON DILATION;  Surgeon: Jeani Hawking, MD;  [facility];   Service: Endoscopy;  Laterality: N/A;   COLONOSCOPY  02/04/2019   FLEXIBLE SIGMOIDOSCOPY N/A 04/22/2020   Procedure: FLEXIBLE SIGMOIDOSCOPY;  Surgeon: Romie Levee, MD;  [facility];  Service: Endoscopy;  Laterality: N/A;   FLEXIBLE SIGMOIDOSCOPY N/A 06/03/2020   Procedure: FLEXIBLE SIGMOIDOSCOPY;  Surgeon: Jeani Hawking, MD;  [facility];  Service: Endoscopy;  Laterality: N/A;   FLEXIBLE SIGMOIDOSCOPY N/A 07/30/2020   Procedure: FLEXIBLE SIGMOIDOSCOPY;  Surgeon: Jeani Hawking, MD;  [facility];  Service: Endoscopy;  Laterality: N/A;   ILEOSTOMY Right 11/25/2019   Procedure: PLACEMENT OF ILEOSTOMY;  Surgeon: Romie Levee, MD;  [facility];  Service: General;  Laterality: Right;   IR CATHETER TUBE CHANGE  11/11/2019   IR PATIENT EVAL TECH 0-60 MINS  12/22/2019   KNEE ARTHROSCOPY Left 11/19/2018   LAPAROTOMY N/A 11/25/2019   Procedure: Joanne Gavel LAPAROTMY WITH OPEN PELVIC WASHOUT;  Surgeon: Romie Levee, MD;  [facility];  Service: General;  Laterality: N/A;   PORT-A-CATH REMOVAL N/A 10/15/2019   Procedure: PORT REMOVAL;  Surgeon: Romie Levee, MD;  [facility];  Service: General;  Laterality: N/A;   PORTACATH PLACEMENT Left 02/21/2019   Procedure: INSERTION PORT-A-CATH;  Surgeon: Luretha Murphy, MD;  [facility];  Service: General;  Laterality: Left;   WISDOM TOOTH EXTRACTION     XI ROBOTIC ASSISTED LOWER ANTERIOR RESECTION N/A 10/15/2019   Procedure: XI ROBOTIC ASSISTED LOWER ANTERIOR RESECTION, RIGID PROCTOSCOPY;  Surgeon: Romie Levee, MD;  [facility];  Service: General;  Laterality: N/A;   XI ROBOTIC ASSISTED LOWER ANTERIOR RESECTION N/A 11/19/2020   Procedure: XI ROBOTIC ASSISTED REDO OF LOWER ANTERIOR RESECTION WITH ROBOTIC LYSIS OF ADHESIONS;  Surgeon: Romie Levee, MD;  [facility];  Service: General;  Laterality: N/A;     A IV Location/Drains/Wounds Patient Lines/Drains/Airways Status     Active  Line/Drains/Airways     Name Placement date Placement time Site Days   Peripheral IV 11/27/22 20 G 1" Left Antecubital 11/27/22  1459  Antecubital  less than 1   Closed System Drain Right Bulb (JP) 19 Fr. 11/19/20  1158  --  738   Ileostomy RUQ 11/25/19  1508  RUQ  1098   Incision - 5 Ports Abdomen Left;Mid Right;Medial;Upper Right;Medial;Mid Right;Lateral Right;Lower;Lateral 10/15/19  1325  -- 1139   Incision - 6 Ports Abdomen Right;Upper Right Right;Lower Right;Umbilicus Left Left;Lateral 11/19/20  1134  -- 738            Intake/Output Last 24 hours No intake or output data in the 24 hours ending 11/27/22 1732  Labs/Imaging Results for orders placed or performed during the hospital encounter of 11/27/22 (from the past 48 hour(s))  Lipase, blood     Status: None   Collection Time: 11/27/22  1:58 PM  Result Value Ref Range   Lipase 31 11 - 51 U/L    Comment: Performed at [facility], [address] [place], [place] [address]  Comprehensive metabolic panel     Status: Abnormal   Collection Time: 11/27/22  1:58 PM  Result Value Ref Range   Sodium 139 135 - 145 mmol/L   Potassium 4.1 3.5 - 5.1 mmol/L   Chloride 105 98 - 111 mmol/L   CO2 23 22 - 32 mmol/L   Glucose, Bld 103 (H) 70 - 99 mg/dL    Comment: Glucose reference range applies only to samples taken after fasting for at least 8 hours.   BUN 13 6 - 20 mg/dL   Creatinine, Ser 1.91 0.61 - 1.24 mg/dL   Calcium 9.0 8.9 - 47.8 mg/dL   Total Protein 7.2 6.5 - 8.1 g/dL   Albumin 4.2 3.5 - 5.0 g/dL   AST 25 15 - 41 U/L   ALT 27 0 - 44 U/L   Alkaline Phosphatase 67 38 - 126 U/L   Total Bilirubin 1.0 0.3 - 1.2 mg/dL   GFR, Estimated >29 >56 mL/min    Comment: (NOTE) Calculated using the CKD-EPI Creatinine Equation (2021)    Anion gap 11 5 - 15    Comment: Performed at [facility], [address] [place], [place] [address]  CBC     Status: None   Collection Time: 11/27/22  1:58 PM   Result Value Ref Range   WBC 5.7 4.0 - 10.5 K/uL   RBC 5.08 4.22 - 5.81 MIL/uL   Hemoglobin 14.6 13.0 - 17.0 g/dL   HCT 65.7 84.6 - 96.2 %   MCV 88.2 80.0 - 100.0 fL   MCH 28.7 26.0 - 34.0 pg   MCHC 32.6 30.0 - 36.0 g/dL   RDW 95.2 84.1 - 32.4 %   Platelets 293 150 - 400 K/uL   nRBC 0.0 0.0 - 0.2 %    Comment: Performed at [facility], [address] [place], [place] [address]  Urinalysis, Routine w reflex microscopic -Urine, Clean Catch     Status: Abnormal   Collection Time: 11/27/22  4:29 PM  Result  Value Ref Range   Color, Urine STRAW (A) YELLOW   APPearance CLEAR CLEAR   Specific Gravity, Urine 1.024 1.005 - 1.030   pH 5.0 5.0 - 8.0   Glucose, UA NEGATIVE NEGATIVE mg/dL   Hgb urine dipstick NEGATIVE NEGATIVE   Bilirubin Urine NEGATIVE NEGATIVE   Ketones, ur NEGATIVE NEGATIVE mg/dL   Protein, ur NEGATIVE NEGATIVE mg/dL   Nitrite NEGATIVE NEGATIVE   Leukocytes,Ua NEGATIVE NEGATIVE    Comment: Performed at [facility], [address], [place], [place] [address]  Lactic acid, plasma     Status: None   Collection Time: 11/27/22  5:00 PM  Result Value Ref Range   Lactic Acid, Venous 1.3 0.5 - 1.9 mmol/L    Comment: Performed at [facility], [address], [place], [place] [address]   CT ABDOMEN PELVIS W CONTRAST  Result Date: 11/27/2022 CLINICAL DATA:  Right lower quadrant pain. Bowel obstruction suspected EXAM: CT ABDOMEN AND PELVIS WITH CONTRAST TECHNIQUE: Multidetector CT imaging of the abdomen and pelvis was performed using the standard protocol following bolus administration of intravenous contrast. RADIATION DOSE REDUCTION: This exam was performed according to the departmental dose-optimization program which includes automated exposure control, adjustment of the mA and/or kV according to patient size and/or use of iterative reconstruction technique. CONTRAST:  OMNIPAQUE IOHEXOL 300 MG/ML  SOLN COMPARISON:   02/22/2021 FINDINGS: Lower chest: Linear opacities in the lower lungs, likely scarring or atelectasis. No acute finding Hepatobiliary: Typical peripheral nodular enhancing hemangioma in the subcapsular right lobe liver, also seen on prior and measuring up to 3.8 cm on axial images. A 1 cm low-density nodule in the lower right lobe liver is also stable in suggestive of hemangioma.No evidence of biliary obstruction or stone. Pancreas: Unremarkable. Spleen: Unremarkable. Adrenals/Urinary Tract: Negative adrenals. No hydronephrosis or stone. Unremarkable bladder. Stomach/Bowel: Dilated and fluid-filled small bowel in the right lower quadrant with transition point just beyond an enteroenteric anastomosis related to old enterostomy, consistent with small-bowel obstruction. Relative decompression of the colon. There is regional fat stranding which is likely secondary. Chronic ill-defined soft tissue density in the presacral space, indistinguishable from the rectum with coarse dystrophic calcifications. There are a few internal gas bubbles newly seen in suggesting some luminal continuity. No drainable collection. Vascular/Lymphatic: No acute vascular abnormality. No mass or adenopathy. Reproductive: Enlarged central prostate projecting into the bladder base, also seen on prior. Other: No ascites or pneumoperitoneum.  Diastasis rectus. Musculoskeletal: No acute abnormalities. IMPRESSION: 1. Small bowel obstruction at the level of the ileum just beyond a enteroenteric anastomosis. 2. Presacral scarring related to rectal cancer treatment. There are a few bubbles of superimposed gas suggesting luminal continuity, presumably long-standing in the absence of fluid collection or superimposed pelvic inflammation. Most recent comparison is from 2022. Electronically Signed   By: Tiburcio Pea M.D.   On: 11/27/2022 16:21    Pending Labs Unresulted Labs (From admission, onward)     Start     Ordered   11/27/22 1436  Lactic acid,  plasma  Now then every 2 hours,   R (with STAT occurrences)      11/27/22 1435   Signed and Held  HIV Antibody (routine testing w rflx)  (HIV Antibody (Routine testing w reflex) panel)  Once,   R        Signed and Held   Signed and Held  Basic metabolic panel  Tomorrow morning,   R        Signed and  Held   Signed and Held  CBC  Tomorrow morning,   R        Signed and Held            Vitals/Pain Today's Vitals   11/27/22 1339 11/27/22 1340 11/27/22 1656 11/27/22 1701  BP: (!) 170/114   (!) 154/97  Pulse: 73   67  Resp: (!) 21   16  Temp: 98 F (36.7 C)   98.1 F (36.7 C)  TempSrc: Oral   Oral  SpO2: 100%   97%  Weight:  85.3 kg    Height:  6' (1.829 m)    PainSc:  10-Worst pain ever 7      Isolation Precautions No active isolations  Medications Medications  HYDROmorphone (DILAUDID) injection 1 mg (1 mg Intravenous Given 11/27/22 1501)  ondansetron (ZOFRAN) injection 4 mg (4 mg Intravenous Given 11/27/22 1501)  iohexol (OMNIPAQUE) 300 MG/ML solution 100 mL (100 mLs Intravenous Contrast Given 11/27/22 1523)  HYDROmorphone (DILAUDID) injection 1 mg (1 mg Intravenous Given 11/27/22 1714)    Mobility walks     Focused Assessments General Surgery   R Recommendations: See Admitting Provider Note  Report given to:   Additional Notes: AAOx4, Ambulates,

## 2022-11-27 NOTE — H&P (Signed)
Brandon Barry is an 47 y.o. male.   Chief Complaint: abdominal pain HPI: This is a 47 year old gentleman is known to our group status post a history of rectal cancer diagnosed in 2020.  He had neoadjuvant chemotherapy and radiation therapy and then underwent a robot-assisted low anterior resection by Dr. Maisie Fus in 2021.  He had to return to the operating room later that year for an anastomotic leak and eventually had a loop ileostomy performed.  He was returned to the operating room in 2022 for a robot-assisted redo of his low anterior resection for anastomotic stricture.  He had his loop ileostomy taken down in [place] in 2023.  Several days after that he had return to the operating room for a fascial dehiscence and placement of Vicryl mesh.  He has since developed a hernia and has been seen at [facility] for possible hernia repair in the future. He has been doing well and has been at his baseline.  He has to give himself an enema every morning to help move his bowels.  This morning he developed a gradual onset of right-sided abdominal pain which he describes as feeling active blockage.  He describes severe cramping pain with some nausea but no vomiting.  He does not have any pain at the site of his hernia.  He is otherwise without complaints.  Past Medical History:  Diagnosis Date   Hyperlipidemia    OSA on CPAP    Peripheral neuropathy    Hands and feet from Chemo   Rectal adenocarcinoma (HCC)    Right shoulder pain     Past Surgical History:  Procedure Laterality Date   APPLICATION OF WOUND VAC N/A 11/25/2019   Procedure: APPLICATION OF WOUND VAC AND DRAIN PLACEMENT;  Surgeon: Romie Levee, MD;  [facility];  Service: General;  Laterality: N/A;   BALLOON DILATION N/A 06/03/2020   Procedure: Rubye Beach;  Surgeon: Jeani Hawking, MD;  [facility];  Service: Endoscopy;  Laterality: N/A;   BALLOON DILATION N/A 07/30/2020   Procedure: BALLOON DILATION;  Surgeon:  Jeani Hawking, MD;  [facility];  Service: Endoscopy;  Laterality: N/A;   COLONOSCOPY  02/04/2019   FLEXIBLE SIGMOIDOSCOPY N/A 04/22/2020   Procedure: FLEXIBLE SIGMOIDOSCOPY;  Surgeon: Romie Levee, MD;  [facility];  Service: Endoscopy;  Laterality: N/A;   FLEXIBLE SIGMOIDOSCOPY N/A 06/03/2020   Procedure: FLEXIBLE SIGMOIDOSCOPY;  Surgeon: Jeani Hawking, MD;  [facility];  Service: Endoscopy;  Laterality: N/A;   FLEXIBLE SIGMOIDOSCOPY N/A 07/30/2020   Procedure: FLEXIBLE SIGMOIDOSCOPY;  Surgeon: Jeani Hawking, MD;  [facility];  Service: Endoscopy;  Laterality: N/A;   ILEOSTOMY Right 11/25/2019   Procedure: PLACEMENT OF ILEOSTOMY;  Surgeon: Romie Levee, MD;  [facility];  Service: General;  Laterality: Right;   IR CATHETER TUBE CHANGE  11/11/2019   IR PATIENT EVAL TECH 0-60 MINS  12/22/2019   KNEE ARTHROSCOPY Left 11/19/2018   LAPAROTOMY N/A 11/25/2019   Procedure: Joanne Gavel LAPAROTMY WITH OPEN PELVIC WASHOUT;  Surgeon: Romie Levee, MD;  [facility];  Service: General;  Laterality: N/A;   PORT-A-CATH REMOVAL N/A 10/15/2019   Procedure: PORT REMOVAL;  Surgeon: Romie Levee, MD;  [facility];  Service: General;  Laterality: N/A;   PORTACATH PLACEMENT Left 02/21/2019   Procedure: INSERTION PORT-A-CATH;  Surgeon: Luretha Murphy, MD;  [facility];  Service: General;  Laterality: Left;   WISDOM TOOTH EXTRACTION     XI ROBOTIC ASSISTED LOWER ANTERIOR RESECTION N/A  10/15/2019   Procedure: XI ROBOTIC ASSISTED LOWER ANTERIOR RESECTION, RIGID PROCTOSCOPY;  Surgeon: Romie Levee, MD;  [facility];  Service: General;  Laterality: N/A;   XI ROBOTIC ASSISTED LOWER ANTERIOR RESECTION N/A 11/19/2020   Procedure: XI ROBOTIC ASSISTED REDO OF LOWER ANTERIOR RESECTION WITH ROBOTIC LYSIS OF ADHESIONS;  Surgeon: Romie Levee, MD;  [facility];  Service: General;  Laterality: N/A;    Family History  Problem Relation Age of Onset    Heart attack Father    Sleep apnea Father    Cancer Paternal Grandmother    Cancer Paternal Grandfather    Social History:  reports that he quit smoking about 19 years ago. His smoking use included cigarettes. He has never used smokeless tobacco. He reports current alcohol use of about 3.0 standard drinks of alcohol per week. He reports that he does not use drugs.  Allergies: No Known Allergies  (Not in a hospital admission)   Results for orders placed or performed during the hospital encounter of 11/27/22 (from the past 48 hour(s))  Lipase, blood     Status: None   Collection Time: 11/27/22  1:58 PM  Result Value Ref Range   Lipase 31 11 - 51 U/L    Comment: Performed at [facility], [address] [place], [place] [address]  Comprehensive metabolic panel     Status: Abnormal   Collection Time: 11/27/22  1:58 PM  Result Value Ref Range   Sodium 139 135 - 145 mmol/L   Potassium 4.1 3.5 - 5.1 mmol/L   Chloride 105 98 - 111 mmol/L   CO2 23 22 - 32 mmol/L   Glucose, Bld 103 (H) 70 - 99 mg/dL    Comment: Glucose reference range applies only to samples taken after fasting for at least 8 hours.   BUN 13 6 - 20 mg/dL   Creatinine, Ser 5.18 0.61 - 1.24 mg/dL   Calcium 9.0 8.9 - 84.1 mg/dL   Total Protein 7.2 6.5 - 8.1 g/dL   Albumin 4.2 3.5 - 5.0 g/dL   AST 25 15 - 41 U/L   ALT 27 0 - 44 U/L   Alkaline Phosphatase 67 38 - 126 U/L   Total Bilirubin 1.0 0.3 - 1.2 mg/dL   GFR, Estimated >66 >06 mL/min    Comment: (NOTE) Calculated using the CKD-EPI Creatinine Equation (2021)    Anion gap 11 5 - 15    Comment: Performed at [facility], [address], [place], [place] [address]  CBC     Status: None   Collection Time: 11/27/22  1:58 PM  Result Value Ref Range   WBC 5.7 4.0 - 10.5 K/uL   RBC 5.08 4.22 - 5.81 MIL/uL   Hemoglobin 14.6 13.0 - 17.0 g/dL   HCT 10.9 32.3 - 55.7 %   MCV 88.2 80.0 - 100.0 fL   MCH 28.7 26.0 - 34.0 pg   MCHC 32.6  30.0 - 36.0 g/dL   RDW 32.2 02.5 - 42.7 %   Platelets 293 150 - 400 K/uL   nRBC 0.0 0.0 - 0.2 %    Comment: Performed at [facility], [address], [place], [place] [address]  Urinalysis, Routine w reflex microscopic -Urine, Clean Catch     Status: Abnormal   Collection Time: 11/27/22  4:29 PM  Result Value Ref Range   Color, Urine STRAW (A) YELLOW   APPearance CLEAR CLEAR   Specific Gravity, Urine 1.024 1.005 -  1.030   pH 5.0 5.0 - 8.0   Glucose, UA NEGATIVE NEGATIVE mg/dL   Hgb urine dipstick NEGATIVE NEGATIVE   Bilirubin Urine NEGATIVE NEGATIVE   Ketones, ur NEGATIVE NEGATIVE mg/dL   Protein, ur NEGATIVE NEGATIVE mg/dL   Nitrite NEGATIVE NEGATIVE   Leukocytes,Ua NEGATIVE NEGATIVE    Comment: Performed at [facility], [address] [place], [place] [address]   CT ABDOMEN PELVIS W CONTRAST  Result Date: 11/27/2022 CLINICAL DATA:  Right lower quadrant pain. Bowel obstruction suspected EXAM: CT ABDOMEN AND PELVIS WITH CONTRAST TECHNIQUE: Multidetector CT imaging of the abdomen and pelvis was performed using the standard protocol following bolus administration of intravenous contrast. RADIATION DOSE REDUCTION: This exam was performed according to the departmental dose-optimization program which includes automated exposure control, adjustment of the mA and/or kV according to patient size and/or use of iterative reconstruction technique. CONTRAST:  OMNIPAQUE IOHEXOL 300 MG/ML  SOLN COMPARISON:  02/22/2021 FINDINGS: Lower chest: Linear opacities in the lower lungs, likely scarring or atelectasis. No acute finding Hepatobiliary: Typical peripheral nodular enhancing hemangioma in the subcapsular right lobe liver, also seen on prior and measuring up to 3.8 cm on axial images. A 1 cm low-density nodule in the lower right lobe liver is also stable in suggestive of hemangioma.No evidence of biliary obstruction or stone. Pancreas: Unremarkable. Spleen:  Unremarkable. Adrenals/Urinary Tract: Negative adrenals. No hydronephrosis or stone. Unremarkable bladder. Stomach/Bowel: Dilated and fluid-filled small bowel in the right lower quadrant with transition point just beyond an enteroenteric anastomosis related to old enterostomy, consistent with small-bowel obstruction. Relative decompression of the colon. There is regional fat stranding which is likely secondary. Chronic ill-defined soft tissue density in the presacral space, indistinguishable from the rectum with coarse dystrophic calcifications. There are a few internal gas bubbles newly seen in suggesting some luminal continuity. No drainable collection. Vascular/Lymphatic: No acute vascular abnormality. No mass or adenopathy. Reproductive: Enlarged central prostate projecting into the bladder base, also seen on prior. Other: No ascites or pneumoperitoneum.  Diastasis rectus. Musculoskeletal: No acute abnormalities. IMPRESSION: 1. Small bowel obstruction at the level of the ileum just beyond a enteroenteric anastomosis. 2. Presacral scarring related to rectal cancer treatment. There are a few bubbles of superimposed gas suggesting luminal continuity, presumably long-standing in the absence of fluid collection or superimposed pelvic inflammation. Most recent comparison is from 2022. Electronically Signed   By: Tiburcio Pea M.D.   On: 11/27/2022 16:21    Review of Systems  All other systems reviewed and are negative.   Blood pressure (!) 154/97, pulse 67, temperature 98.1 F (36.7 C), temperature source Oral, resp. rate 16, height 6' (1.829 m), weight 85.3 kg, SpO2 97 %. Physical Exam Constitutional:      Appearance: He is well-developed and normal weight. He is not ill-appearing or diaphoretic.  HENT:     Head: Normocephalic and atraumatic.  Cardiovascular:     Rate and Rhythm: Normal rate and regular rhythm.  Pulmonary:     Effort: Pulmonary effort is normal. No respiratory distress.  Abdominal:      Comments: His abdomen is soft.  He has a large, easily reducible incisional hernia at the midline.  There is only very minimal right lower quadrant abdominal tenderness.  He has no frank peritonitis  Skin:    General: Skin is warm and dry.  Neurological:     General: No focal deficit present.     Mental Status: He is alert.  Psychiatric:  Behavior: Behavior normal.      Assessment/Plan Small bowel obstruction  I reviewed his CT scan of his abdomen and pelvis.  The obstruction appears to be just beyond a small bowel anastomosis in the distal ileum.  There are no signs of recurrent cancer.  I suspect this is from adhesions.  I discussed the diagnosis with the patient.  As he is not currently vomiting we are going to try to hold on NG tube placement and treat this conservatively with IV fluids and bowel rest.  I will order the small bowel protocol with oral Gastrografin.  Should he develop any abdominal pain or emesis, we will place a nasogastric tube.  Again, hopefully this will improve without need for surgical intervention given his complex surgical history.  He agrees with the plans  Moderately complex medical decision making  Abigail Miyamoto, MD 11/27/2022, 5:25 PM

## 2022-11-27 NOTE — ED Provider Notes (Signed)
[place] EMERGENCY DEPARTMENT AT [facility] Provider Note   CSN: [id number] Arrival date & time: 11/27/22  1254     History  Chief Complaint  Patient presents with   Abdominal Pain    Brandon Barry is a 47 y.o. male with h/o rectal cancer w/ extensive abdominal surgical history, HLD, OSA on CPAP who presents with gradual onset right-sided abdominal pain that feels like a "blockage." Pain severe, rated 7/10, a/w nausea no vomiting. No urinary sxs, f/c. Last BM was this AM, gives himself daily morning enemas, it was normal for him, no hematochezia/melena.  Hasn't passed gas today that he can recall. Has ventral hernia and normally wears abdominal binder, hernia is normal in appearance to him currently.   Abdominal Pain      Home Medications Prior to Admission medications   Medication Sig Start Date End Date Taking? Authorizing Provider  escitalopram (LEXAPRO) 20 MG tablet Take 1 tablet by mouth daily. 07/26/22   [provider]      Allergies    Patient has no known allergies.    Review of Systems   Review of Systems  Gastrointestinal:  Positive for abdominal pain.   A 10 point review of systems was performed and is negative unless otherwise reported in HPI.  Physical Exam Updated Vital Signs BP (!) 170/114   Pulse 73   Temp 98 F (36.7 C) (Oral)   Resp (!) 21   Ht 6' (1.829 m)   Wt 85.3 kg   SpO2 100%   BMI 25.50 kg/m  Physical Exam General: Uncomfortable appearing male, lying in bed.  HEENT: Sclera anicteric, dry mucous membranes, trachea midline.  Cardiology: RRR, no murmurs/rubs/gallops. BL radial and DP pulses equal bilaterally.  Resp: Normal respiratory rate and effort. CTAB, no wheezes, rhonchi, crackles.  Abd: Significant abdominal scarring from prior surgeries/ostomies. Large ventral hernia soft, non-erythematous. TTP in RLQ with involuntary guarding. Soft, non-distended. GU: Deferred. MSK: No peripheral edema or signs of  trauma. Extremities without deformity or TTP. No cyanosis or clubbing. Skin: warm, dry.  Back: No CVA tenderness Neuro: A&Ox4, CNs II-XII grossly intact. MAEs. Sensation grossly intact.  Psych: Normal mood and affect.   ED Results / Procedures / Treatments   Labs (all labs ordered are listed, but only abnormal results are displayed) Labs Reviewed  COMPREHENSIVE METABOLIC PANEL - Abnormal; Notable for the following components:      Result Value   Glucose, Bld 103 (*)    All other components within normal limits  LIPASE, BLOOD  CBC  URINALYSIS, ROUTINE W REFLEX MICROSCOPIC  LACTIC ACID, PLASMA  LACTIC ACID, PLASMA    EKG None  Radiology CT ABDOMEN PELVIS W CONTRAST  Result Date: 11/27/2022 CLINICAL DATA:  Right lower quadrant pain. Bowel obstruction suspected EXAM: CT ABDOMEN AND PELVIS WITH CONTRAST TECHNIQUE: Multidetector CT imaging of the abdomen and pelvis was performed using the standard protocol following bolus administration of intravenous contrast. RADIATION DOSE REDUCTION: This exam was performed according to the departmental dose-optimization program which includes automated exposure control, adjustment of the mA and/or kV according to patient size and/or use of iterative reconstruction technique. CONTRAST:  OMNIPAQUE IOHEXOL 300 MG/ML  SOLN COMPARISON:  02/22/2021 FINDINGS: Lower chest: Linear opacities in the lower lungs, likely scarring or atelectasis. No acute finding Hepatobiliary: Typical peripheral nodular enhancing hemangioma in the subcapsular right lobe liver, also seen on prior and measuring up to 3.8 cm on axial images. A 1 cm low-density nodule in  the lower right lobe liver is also stable in suggestive of hemangioma.No evidence of biliary obstruction or stone. Pancreas: Unremarkable. Spleen: Unremarkable. Adrenals/Urinary Tract: Negative adrenals. No hydronephrosis or stone. Unremarkable bladder. Stomach/Bowel: Dilated and fluid-filled small bowel in the right  lower quadrant with transition point just beyond an enteroenteric anastomosis related to old enterostomy, consistent with small-bowel obstruction. Relative decompression of the colon. There is regional fat stranding which is likely secondary. Chronic ill-defined soft tissue density in the presacral space, indistinguishable from the rectum with coarse dystrophic calcifications. There are a few internal gas bubbles newly seen in suggesting some luminal continuity. No drainable collection. Vascular/Lymphatic: No acute vascular abnormality. No mass or adenopathy. Reproductive: Enlarged central prostate projecting into the bladder base, also seen on prior. Other: No ascites or pneumoperitoneum.  Diastasis rectus. Musculoskeletal: No acute abnormalities. IMPRESSION: 1. Small bowel obstruction at the level of the ileum just beyond a enteroenteric anastomosis. 2. Presacral scarring related to rectal cancer treatment. There are a few bubbles of superimposed gas suggesting luminal continuity, presumably long-standing in the absence of fluid collection or superimposed pelvic inflammation. Most recent comparison is from 2022. Electronically Signed   By: Tiburcio Pea M.D.   On: 11/27/2022 16:21    Procedures Procedures    Medications Ordered in ED Medications  HYDROmorphone (DILAUDID) injection 1 mg (1 mg Intravenous Given 11/27/22 1501)  ondansetron (ZOFRAN) injection 4 mg (4 mg Intravenous Given 11/27/22 1501)  iohexol (OMNIPAQUE) 300 MG/ML solution 100 mL (100 mLs Intravenous Contrast Given 11/27/22 1523)    ED Course/ Medical Decision Making/ A&P                          Medical Decision Making Amount and/or Complexity of Data Reviewed Labs: ordered. Decision-making details documented in ED Course. Radiology: ordered. Decision-making details documented in ED Course.  Risk Prescription drug management. Decision regarding hospitalization.    This patient presents to the ED for concern of abd pain,  this involves an extensive number of treatment options, and is a complaint that carries with it a high risk of complications and morbidity.  I considered the following differential and admission for this acute, potentially life threatening condition.   MDM:    For DDX for abdominal pain includes but is not limited to:  Abdominal exam with peritoneal sign of guarding in RLQ.  Consider acute appendicitis, acute hepatobiliary disease including cholecystitis or cholangitis, pancreatitis.  Consider gastric perforation.  Also consider SBO given patient's extensive abdominal surgical history and lack of passing gas today.  Will obtain labs and CT abdomen pelvis.  Clinical Course as of 11/27/22 1651  Mon Nov 27, 2022  1512 Lipase: 31 Wnl [HN]  1512 CBC wnl [HN]  1512 Comprehensive metabolic panel(!) wnl [HN]  1639 CT ABDOMEN PELVIS W CONTRAST SBO. Will consult to surgery. [HN]  1650 Dr. Magnus Ivan w/ gen surgery will admit this patient.  [HN]    Clinical Course User Index [HN] Loetta Rough, MD    Labs: I Ordered, and personally interpreted labs.  The pertinent results include: Those listed above  Imaging Studies ordered: I ordered imaging studies including CT abdomen pelvis with contrast I independently visualized and interpreted imaging. I agree with the radiologist interpretation  Additional history obtained from chart review.    Reevaluation: After the interventions noted above, I reevaluated the patient and found that they have :improved  Social Determinants of Health:  patient lives independently  Disposition:  Admit to  surgery for SBO  Co morbidities that complicate the patient evaluation  Past Medical History:  Diagnosis Date   Hyperlipidemia    OSA on CPAP    Peripheral neuropathy    Hands and feet from Chemo   Rectal adenocarcinoma (HCC)    Right shoulder pain      Medicines Meds ordered this encounter  Medications   HYDROmorphone (DILAUDID) injection 1 mg    ondansetron (ZOFRAN) injection 4 mg   iohexol (OMNIPAQUE) 300 MG/ML solution 100 mL    I have reviewed the patients home medicines and have made adjustments as needed  Problem List / ED Course: Problem List Items Addressed This Visit   None Visit Diagnoses     Small bowel obstruction (HCC)    -  Primary                   This note was created using dictation software, which may contain spelling or grammatical errors.    Loetta Rough, MD 11/27/22 [phone]

## 2022-11-27 NOTE — ED Triage Notes (Signed)
Pt is coming in today complaining of RUQ pain, and nausea. Normal bowel movement for pt this morning.

## 2022-11-28 ENCOUNTER — Inpatient Hospital Stay (HOSPITAL_COMMUNITY): Payer: Commercial Managed Care - PPO

## 2022-11-28 LAB — CBC
HCT: 40.8 % (ref 39.0–52.0)
Hemoglobin: 13.8 g/dL (ref 13.0–17.0)
MCH: 30.1 pg (ref 26.0–34.0)
MCHC: 33.8 g/dL (ref 30.0–36.0)
MCV: 89.1 fL (ref 80.0–100.0)
Platelets: 274 10*3/uL (ref 150–400)
RBC: 4.58 MIL/uL (ref 4.22–5.81)
RDW: 14.7 % (ref 11.5–15.5)
WBC: 6.5 10*3/uL (ref 4.0–10.5)
nRBC: 0 % (ref 0.0–0.2)

## 2022-11-28 LAB — HIV ANTIBODY (ROUTINE TESTING W REFLEX): HIV Screen 4th Generation wRfx: NONREACTIVE

## 2022-11-28 LAB — BASIC METABOLIC PANEL
Anion gap: 6 (ref 5–15)
BUN: 12 mg/dL (ref 6–20)
CO2: 24 mmol/L (ref 22–32)
Calcium: 8.1 mg/dL — ABNORMAL LOW (ref 8.9–10.3)
Chloride: 109 mmol/L (ref 98–111)
Creatinine, Ser: 0.94 mg/dL (ref 0.61–1.24)
GFR, Estimated: >60 mL/min (ref >60)
Glucose, Bld: 99 mg/dL (ref 70–99)
Potassium: 3.6 mmol/L (ref 3.5–5.1)
Sodium: 139 mmol/L (ref 135–145)

## 2022-11-28 MED ORDER — OXYCODONE HCL 5 MG PO TABS
5.0000 mg | ORAL_TABLET | ORAL | Status: DC | PRN
  Administered 2022-11-28: 5 mg via ORAL
  Filled 2022-11-28: qty 1

## 2022-11-28 NOTE — Progress Notes (Signed)
Discharge instructions given to patient and all questions were answered.  

## 2022-11-28 NOTE — Discharge Summary (Signed)
[facility] Discharge Summary   Patient ID: Brandon Barry MRN: [id number] DOB/AGE: [birth year] 48 y.o.  Admit date: 11/27/2022 Discharge date: 11/28/2022  Admitting Diagnosis: SBO  Discharge Diagnosis Patient Active Problem List   Diagnosis Date Noted   SBO (small bowel obstruction) (HCC) 11/27/2022   Personal history of rectal cancer 02/17/2022   Ileostomy in place [facility]) 01/17/2022   Ileus (HCC) 11/23/2020   Anastomotic stricture of colorectal region 11/19/2020   Medication management 10/12/2020   OSA on CPAP 11/01/2019   Peripheral neuropathy    Hyperlipidemia    Chronic back pain greater than 3 months duration    Right shoulder pain    Intraabdominal abscess 10/31/2019   Hand foot syndrome w chemotherapy 04/04/2019   Port-A-Cath in place 03/27/2019   Cancer of rectum  ypT3, ypN0 s/p TNT & robotic LAR 10/15/2019 02/19/2019   Pain in left knee 07/22/2018   Anxiety state 03/18/2007   DEPRESSION 03/18/2007   Allergic rhinitis 03/18/2007    Consultants None   Imaging: DG Abd Portable 1V-Small Bowel Obstruction Protocol-initial, 8 hr delay  Result Date: 11/28/2022 CLINICAL DATA:  Small-bowel protocol 8 hours postcontrast EXAM: PORTABLE ABDOMEN - 1 VIEW COMPARISON:  CT abdomen and pelvis 11/27/2022 FINDINGS: Enteric contrast is seen throughout the colon. No evidence of obstruction. No acute osseous abnormality IMPRESSION: Enteric contrast is seen throughout the colon. No evidence of obstruction. Electronically Signed   By: Minerva Fester M.D.   On: 11/28/2022 02:56   CT ABDOMEN PELVIS W CONTRAST  Result Date: 11/27/2022 CLINICAL DATA:  Right lower quadrant pain. Bowel obstruction suspected EXAM: CT ABDOMEN AND PELVIS WITH CONTRAST TECHNIQUE: Multidetector CT imaging of the abdomen and pelvis was performed using the standard protocol following bolus administration of intravenous contrast. RADIATION DOSE REDUCTION: This exam was performed according to the  departmental dose-optimization program which includes automated exposure control, adjustment of the mA and/or kV according to patient size and/or use of iterative reconstruction technique. CONTRAST:  OMNIPAQUE IOHEXOL 300 MG/ML  SOLN COMPARISON:  02/22/2021 FINDINGS: Lower chest: Linear opacities in the lower lungs, likely scarring or atelectasis. No acute finding Hepatobiliary: Typical peripheral nodular enhancing hemangioma in the subcapsular right lobe liver, also seen on prior and measuring up to 3.8 cm on axial images. A 1 cm low-density nodule in the lower right lobe liver is also stable in suggestive of hemangioma.No evidence of biliary obstruction or stone. Pancreas: Unremarkable. Spleen: Unremarkable. Adrenals/Urinary Tract: Negative adrenals. No hydronephrosis or stone. Unremarkable bladder. Stomach/Bowel: Dilated and fluid-filled small bowel in the right lower quadrant with transition point just beyond an enteroenteric anastomosis related to old enterostomy, consistent with small-bowel obstruction. Relative decompression of the colon. There is regional fat stranding which is likely secondary. Chronic ill-defined soft tissue density in the presacral space, indistinguishable from the rectum with coarse dystrophic calcifications. There are a few internal gas bubbles newly seen in suggesting some luminal continuity. No drainable collection. Vascular/Lymphatic: No acute vascular abnormality. No mass or adenopathy. Reproductive: Enlarged central prostate projecting into the bladder base, also seen on prior. Other: No ascites or pneumoperitoneum.  Diastasis rectus. Musculoskeletal: No acute abnormalities. IMPRESSION: 1. Small bowel obstruction at the level of the ileum just beyond a enteroenteric anastomosis. 2. Presacral scarring related to rectal cancer treatment. There are a few bubbles of superimposed gas suggesting luminal continuity, presumably long-standing in the absence of fluid collection or  superimposed pelvic inflammation. Most recent comparison is from 2022. Electronically Signed   By: Christiane Ha  Watts M.D.   On: 11/27/2022 16:21    Procedures None  Hospital Course:  Patient is a 47 year old male with complex surgical hx who presented to the ED with abdominal pain and concern for bowel blockage.  Workup showed SBO.  Patient was admitted and underwent SBO protocol without NG placement.  Tolerated this well and diet was advanced as tolerated to FLD.  On 11/28/22, the patient was voiding well, tolerating diet, ambulating well, pain well controlled, vital signs stable, and felt stable for discharge home.  Patient will follow up in our office as needed and is aware to call with questions or concerns.      Allergies as of 11/28/2022   No Known Allergies      Medication List     TAKE these medications    escitalopram 20 MG tablet Commonly known as: LEXAPRO Take 1 tablet by mouth daily.          Follow-up Information     Romie Levee, MD. Call.   Specialties: General Surgery, Colon and Rectal Surgery Why: As needed Contact information: [address] [place] [address] [place] [address] [phone]                 Signed: Juliet Rude , [facility] 11/28/2022, 2:21 PM Please see Amion for pager number during day hours 7:00am-4:30pm

## 2022-11-28 NOTE — Progress Notes (Signed)
   11/28/22 1326  TOC Brief Assessment  Insurance and Status Reviewed  Patient has primary care physician Yes  Home environment has been reviewed home with spouse  Prior level of function: independent  Prior/Current Home Services No current home services  Social Determinants of Health Reivew SDOH reviewed no interventions necessary  Readmission risk has been reviewed Yes  Transition of care needs no transition of care needs at this time

## 2022-11-28 NOTE — Progress Notes (Signed)
Subjective/Chief Complaint: Feels better today with less Right sided abdominal pain Denies nausea Had liquid bm   Objective: Vital signs in last 24 hours: Temp:  [97.6 F (36.4 C)-98.2 F (36.8 C)] 97.8 F (36.6 C) (06/18 0519) Pulse Rate:  [49-73] 49 (06/18 0519) Resp:  [16-21] 18 (06/18 0519) BP: (122-170)/(88-114) 138/88 (06/18 0519) SpO2:  [95 %-100 %] 97 % (06/18 0519) Weight:  [85.3 kg] 85.3 kg (06/17 1340) Last BM Date : 11/28/22 (stated that he is having small liquid bms that are clear)  Intake/Output from previous day: 06/17 0701 - 06/18 0700 In: 1452.4 [P.O.:60; I.V.:1392.4] Out: -  Intake/Output this shift: No intake/output data recorded.  Exam: Looks more comfortable than yesterday Abdomen soft, non-distended, minimal RLQ tenderness  Lab Results:  Recent Labs    11/27/22 1358 11/28/22 0438  WBC 5.7 6.5  HGB 14.6 13.8  HCT 44.8 40.8  PLT 293 274   BMET Recent Labs    11/27/22 1358 11/28/22 0438  NA 139 139  K 4.1 3.6  CL 105 109  CO2 23 24  GLUCOSE 103* 99  BUN 13 12  CREATININE 0.97 0.94  CALCIUM 9.0 8.1*   PT/INR No results for input(s): "LABPROT", "INR" in the last 72 hours. ABG No results for input(s): "PHART", "HCO3" in the last 72 hours.  Invalid input(s): "PCO2", "PO2"  Studies/Results: DG Abd Portable 1V-Small Bowel Obstruction Protocol-initial, 8 hr delay  Result Date: 11/28/2022 CLINICAL DATA:  Small-bowel protocol 8 hours postcontrast EXAM: PORTABLE ABDOMEN - 1 VIEW COMPARISON:  CT abdomen and pelvis 11/27/2022 FINDINGS: Enteric contrast is seen throughout the colon. No evidence of obstruction. No acute osseous abnormality IMPRESSION: Enteric contrast is seen throughout the colon. No evidence of obstruction. Electronically Signed   By: Minerva Fester M.D.   On: 11/28/2022 02:56   CT ABDOMEN PELVIS W CONTRAST  Result Date: 11/27/2022 CLINICAL DATA:  Right lower quadrant pain. Bowel obstruction suspected EXAM: CT ABDOMEN  AND PELVIS WITH CONTRAST TECHNIQUE: Multidetector CT imaging of the abdomen and pelvis was performed using the standard protocol following bolus administration of intravenous contrast. RADIATION DOSE REDUCTION: This exam was performed according to the departmental dose-optimization program which includes automated exposure control, adjustment of the mA and/or kV according to patient size and/or use of iterative reconstruction technique. CONTRAST:  OMNIPAQUE IOHEXOL 300 MG/ML  SOLN COMPARISON:  02/22/2021 FINDINGS: Lower chest: Linear opacities in the lower lungs, likely scarring or atelectasis. No acute finding Hepatobiliary: Typical peripheral nodular enhancing hemangioma in the subcapsular right lobe liver, also seen on prior and measuring up to 3.8 cm on axial images. A 1 cm low-density nodule in the lower right lobe liver is also stable in suggestive of hemangioma.No evidence of biliary obstruction or stone. Pancreas: Unremarkable. Spleen: Unremarkable. Adrenals/Urinary Tract: Negative adrenals. No hydronephrosis or stone. Unremarkable bladder. Stomach/Bowel: Dilated and fluid-filled small bowel in the right lower quadrant with transition point just beyond an enteroenteric anastomosis related to old enterostomy, consistent with small-bowel obstruction. Relative decompression of the colon. There is regional fat stranding which is likely secondary. Chronic ill-defined soft tissue density in the presacral space, indistinguishable from the rectum with coarse dystrophic calcifications. There are a few internal gas bubbles newly seen in suggesting some luminal continuity. No drainable collection. Vascular/Lymphatic: No acute vascular abnormality. No mass or adenopathy. Reproductive: Enlarged central prostate projecting into the bladder base, also seen on prior. Other: No ascites or pneumoperitoneum.  Diastasis rectus. Musculoskeletal: No acute abnormalities. IMPRESSION: 1. Small bowel  obstruction at the level of  the ileum just beyond a enteroenteric anastomosis. 2. Presacral scarring related to rectal cancer treatment. There are a few bubbles of superimposed gas suggesting luminal continuity, presumably long-standing in the absence of fluid collection or superimposed pelvic inflammation. Most recent comparison is from 2022. Electronically Signed   By: Tiburcio Pea M.D.   On: 11/27/2022 16:21    Anti-infectives: Anti-infectives (From admission, onward)    None       Assessment/Plan: SBO  The protocol films this morning shows contrast through out the colon and no small bowel dilation or obstruction now  Given improvement will start full liquids.  If tolerating a diet, will consider discharge this afternoon vs tomorrow   LOS: 1 day    Abigail Miyamoto MD 11/28/2022

## 2023-03-05 ENCOUNTER — Other Ambulatory Visit: Payer: Commercial Managed Care - PPO

## 2023-03-05 ENCOUNTER — Ambulatory Visit: Payer: Commercial Managed Care - PPO | Admitting: Oncology

## 2023-03-26 ENCOUNTER — Inpatient Hospital Stay: Payer: Commercial Managed Care - PPO | Admitting: Oncology

## 2023-03-26 ENCOUNTER — Inpatient Hospital Stay: Payer: Commercial Managed Care - PPO | Attending: Family Medicine

## 2023-03-26 ENCOUNTER — Telehealth: Payer: Self-pay

## 2023-03-26 NOTE — Telephone Encounter (Signed)
Called patient on 03/26/23 at 10:23 AM to reschedule missed appointment. Left voicemail for patient to return phone call.

## 2023-04-11 ENCOUNTER — Other Ambulatory Visit: Payer: Self-pay | Admitting: Gastroenterology

## 2023-05-08 ENCOUNTER — Encounter: Payer: Self-pay | Admitting: *Deleted

## 2023-05-08 ENCOUNTER — Telehealth: Payer: Self-pay

## 2023-05-08 ENCOUNTER — Inpatient Hospital Stay (HOSPITAL_BASED_OUTPATIENT_CLINIC_OR_DEPARTMENT_OTHER): Payer: Commercial Managed Care - PPO | Admitting: Oncology

## 2023-05-08 ENCOUNTER — Inpatient Hospital Stay: Payer: Commercial Managed Care - PPO | Attending: Family Medicine

## 2023-05-08 VITALS — BP 125/88 | HR 73 | Temp 98.1°F | Resp 18 | Ht 72.0 in | Wt 187.8 lb

## 2023-05-08 DIAGNOSIS — Z923 Personal history of irradiation: Secondary | ICD-10-CM | POA: Diagnosis not present

## 2023-05-08 DIAGNOSIS — C20 Malignant neoplasm of rectum: Secondary | ICD-10-CM | POA: Diagnosis not present

## 2023-05-08 DIAGNOSIS — Z85048 Personal history of other malignant neoplasm of rectum, rectosigmoid junction, and anus: Secondary | ICD-10-CM | POA: Diagnosis present

## 2023-05-08 DIAGNOSIS — K632 Fistula of intestine: Secondary | ICD-10-CM | POA: Diagnosis not present

## 2023-05-08 DIAGNOSIS — Z9221 Personal history of antineoplastic chemotherapy: Secondary | ICD-10-CM | POA: Insufficient documentation

## 2023-05-08 LAB — CEA (ACCESS): CEA (CHCC): 1.5 ng/mL (ref 0.00–5.00)

## 2023-05-08 NOTE — Progress Notes (Signed)
[facility] OFFICE PROGRESS NOTE   Diagnosis: Rectal cancer  INTERVAL HISTORY:   Mr. Godsil returns as scheduled.  He feels well.  He is working.  His bowels are functioning.  He has minimal remaining neuropathy symptoms.  This is not interfere with activity.  He is seeing Dr. Elnoria Howard for a rectal stricture and is scheduled for a sigmoidoscopy next month.  He is followed at [facility] for a hydrocele and ventral diastases.  He is scheduled for follow-up at [facility] next month.  He was admitted with a small bowel obstruction in June.  His symptoms resolved with a small bowel protocol and he was discharged the following day.  Objective:  Vital signs in last 24 hours:  Blood pressure 125/88, pulse 73, temperature 98.1 F (36.7 C), temperature source Temporal, resp. rate 18, height 6' (1.829 m), weight 187 lb 12.8 oz (85.2 kg), SpO2 99%.    Lymphatics: No cervical, supraclavicular, axillary, or inguinal nodes Resp: Lungs clear bilaterally Cardio: Regular rate and rhythm GI: No hepatosplenomegaly, ventral hernia, no mass Vascular: No leg edema   Lab Results:  Lab Results  Component Value Date   WBC 6.5 11/28/2022   HGB 13.8 11/28/2022   HCT 40.8 11/28/2022   MCV 89.1 11/28/2022   PLT 274 11/28/2022   NEUTROABS 10.2 (H) 11/23/2020    CMP  Lab Results  Component Value Date   NA 139 11/28/2022   K 3.6 11/28/2022   CL 109 11/28/2022   CO2 24 11/28/2022   GLUCOSE 99 11/28/2022   BUN 12 11/28/2022   CREATININE 0.94 11/28/2022   CALCIUM 8.1 (L) 11/28/2022   PROT 7.2 11/27/2022   ALBUMIN 4.2 11/27/2022   AST 25 11/27/2022   ALT 27 11/27/2022   ALKPHOS 67 11/27/2022   BILITOT 1.0 11/27/2022   GFRNONAA >60 11/28/2022   GFRAA >60 11/30/2019    Lab Results  Component Value Date   CEA1 1.91 02/22/2021   CEA 1.70 09/01/2022     Medications: I have reviewed the patient's current medications.   Assessment/Plan: Rectosigmoid cancer  Colonoscopy by Dr. Elnoria Howard  02/04/2019-fungating infiltrative and ulcerated nonobstructing large mass in the rectum.  The mass was circumferential measuring 3 cm in length.  There was no bleeding present.  The mass was 10 cm from the anal verge.  Biopsy showed invasive adenocarcinoma, moderately differentiated; MLH1, MSH2, MSH6 and PMS2 intact.   CTs abdomen and pelvis 02/04/2019-apparent colorectal mass measuring 3.9 x 5.4 x 3.2 cm involving the distal sigmoid colon and proximal rectum with adjacent borderline enlarged suspicious mesorectal lymph nodes; indeterminate lesion in segment 6 of the liver measuring 1.4 x 1.1 cm. MRI pelvis 02/13/2019- T3c, N1 tumor at 11.5 cm from the anal verge, 6.7 cm from the internal anal sphincter, 3 lymph nodes greater than 5 mm, largest 8 mm CT chest 02/13/2019-negative for metastatic disease MRI liver 02/13/2019- 3.6 cm hemangioma in the posterior right hepatic lobe, 10 mm indeterminate lesion in the posterior right hepatic lobe-potentially an atypical hemangioma- solitary metastasis not excluded Cycle 1 FOLFOX 02/26/2019 Cycle 2 FOLFOX 03/13/2019, Emend added Cycle 3 FOLFOX 03/27/2019 Cycle 4 FOLFOX 04/10/2019 Cycle 5 FOLFOX 04/24/2019 Cycle 6 FOLFOX 05/07/2019  Cycle 7 FOLFOX 05/22/2019 (5-fluorouracil dose reduced) Cycle 8 FOLFOX 06/09/2019 Capecitabine/radiation 06/30/2019-07/06/2019 Capecitabine placed on hold with the p.m. dose on 07/30/2019, 1000 mg twice daily Capecitabine resumed with the p.m. dose on 08/04/2019 with a dose reduction CT liver 08/11/2019-stable subcapsular hemangioma right hepatic lobe.  Stable small indeterminate right  hepatic lobe lesion unchanged in size from CT 02/04/2019. 10/15/2019, robotic assisted low anterior resection/Port-A-Cath removal, adenocarcinoma of distal sigmoid/proximal rectum invading pericolonic connective tissue, negative resection margins, 0/24 nodes,ypT3ypN0, treatment effect present-tumor associated fibrosis  CT abdomen/pelvis 10/31/2019-anastomotic breakdown  at the rectum, extraluminal stool, right lower quadrant abscess, small bowel obstruction, air within the bladder lumen, stable subcentimeter right hepatic nodule CT abdomen/pelvis 11/15/2019-stable 1 cm segment 7 lesion, no new liver lesions, anastomotic breakdown at the rectum with decreased size of complex surrounding gas/fluid collection in good position of transgluteal drain, persistent small bowel obstruction CT pelvis 02/27/2020-no measurable perianastomotic fluid collection or free air, no evidence of metastatic disease CTs 08/19/2020-stable 2 mm left upper lobe pulmonary nodule.  No other suspicious appearing pulmonary nodules or masses noted.  Cavernous hemangiomas in the liver, similar to the prior examinations.  Postoperative changes with what appears to be some postoperative scarring in the presacral space adjacent to the suture line at the rectosigmoid anastomosis. Redo low anterior resection 11/19/2020 for management of a stricture Evaluation by Dr. Miachel Roux at [facility] 01/10/2021-posterior anastomotic defect on exam at 7 cm, sigmoidoscopy confirmed a 2 cm wide anastomotic defect in the posterior midline CTs 02/22/2021-treatment related changes in the pelvis, reduction in conspicuity of perirectal fat planes, stable right hepatic lobe hemangiomas, stable pulmonary nodules, right abdominal ileostomy MRI pelvis 05/03/2021-postsurgical changes of prior low anterior resection without definite evidence of recurrent or metastatic disease in the pelvis.  Decrease in size of a loculated fluid collection in the presacral space, bordered posteriorly and laterally by T2 hypointense soft tissue representing likely chronic scarring/fibrosis which appears increased. CTs 08/12/2021-compared with 05/03/2021 MRI of the pelvis there is overall similar soft tissue in the presacral space surrounding the colorectal anastomosis at the site of prior contained perforation.  Unchanged hepatic segment 7 enhancing lesion previously  characterized as hemangioma.  Unchanged subcentimeter hypodensity hepatic segment 6.  Unchanged 0.3 cm right lower lobe lung nodule.  No new or enlarging nodules. Ileostomy takedown 01/17/2022 CT abdomen/pelvis 01/22/2022-dilated loops of small bowel with prominent wall thickening of ileal loops with tapering toward the anastomosis in the right mid abdomen, prominent mesenteric nodes, likely reactive, bilateral pleural effusions with atelectasis right greater than left CT Abdo/pelvis 05/08/2022-air and fluid extending from the posterior rectal anastomosis to the presacral space suspicious for fistulous communication, focal area of air/fluid in the presacral space MRI pelvis 05/24/2022-unchanged small tract extending from the surgical anastomosis to the presacral space collection, extensive presacral fibrotic changes with small collection containing gas and fluid CT abdomen/pelvis 11/27/2022-small bowel obstruction at the ileum just beyond the Antero enteric anastomosis, presacral scarring with a few internal gas bubbles, no drainable collection Change in bowel habits/rectal pain secondary to #1, improved Port-A-Cath placement, Dr. Daphine Deutscher, 02/21/2019 Early oxaliplatin neuropathy-trial of Neurontin initiated 08/25/2019 due to foot pain, improved Pain secondary to hand/foot syndrome, radiation skin toxicity, and radiation proctitis-improved Admission with intra-abdominal abscess/anastomotic dehiscence, surgery 11/25/2019- exploratory laparotomy, placement of wound VAC and drain, diverting loop ileostomy     Disposition: Mr. Jontavious is in clinical remission from rectal cancer.  He has a chronic fistula at the anastomosis with a presacral gas/fluid collection.  He is followed by surgery at [facility].  He is considering repair of the ventral diastases.  He is followed at [facility] urology for a hydrocele.  He continues colonoscopy surveillance with Dr. Elnoria Howard.  He reports undergoing a colonoscopy in 2024.  He is scheduled for a  sigmoidoscopy for dilation of a rectal stricture.  Mr. Abdullahi will return for an office visit and CEA in 6 months.  Thornton Papas, MD  05/08/2023  9:51 AM

## 2023-05-08 NOTE — Telephone Encounter (Signed)
Patient gave verbal understanding and had no further questions or concerns  

## 2023-05-08 NOTE — Telephone Encounter (Signed)
-----   Message from Thornton Papas sent at 05/08/2023  3:56 PM EST ----- Please call patient, the CEA is normal, follow-up as scheduled

## 2023-05-25 ENCOUNTER — Encounter (HOSPITAL_COMMUNITY): Payer: Self-pay | Admitting: Gastroenterology

## 2023-06-01 ENCOUNTER — Ambulatory Visit (HOSPITAL_COMMUNITY): Payer: Commercial Managed Care - PPO | Admitting: Anesthesiology

## 2023-06-01 ENCOUNTER — Ambulatory Visit (HOSPITAL_COMMUNITY)
Admission: RE | Admit: 2023-06-01 | Discharge: 2023-06-01 | Disposition: A | Discharge status: Home / Self Care | Payer: Commercial Managed Care - PPO | Attending: Gastroenterology | Admitting: Gastroenterology

## 2023-06-01 ENCOUNTER — Other Ambulatory Visit: Payer: Self-pay

## 2023-06-01 ENCOUNTER — Encounter (HOSPITAL_COMMUNITY)
Admission: RE | Disposition: A | Discharge status: Home / Self Care | Payer: Self-pay | Source: Home / Self Care | Attending: Gastroenterology

## 2023-06-01 ENCOUNTER — Encounter (HOSPITAL_COMMUNITY): Payer: Self-pay | Admitting: Gastroenterology

## 2023-06-01 DIAGNOSIS — K624 Stenosis of anus and rectum: Secondary | ICD-10-CM | POA: Insufficient documentation

## 2023-06-01 DIAGNOSIS — Z87891 Personal history of nicotine dependence: Secondary | ICD-10-CM | POA: Diagnosis not present

## 2023-06-01 DIAGNOSIS — G4733 Obstructive sleep apnea (adult) (pediatric): Secondary | ICD-10-CM | POA: Diagnosis not present

## 2023-06-01 DIAGNOSIS — R194 Change in bowel habit: Secondary | ICD-10-CM | POA: Diagnosis present

## 2023-06-01 DIAGNOSIS — Z85048 Personal history of other malignant neoplasm of rectum, rectosigmoid junction, and anus: Secondary | ICD-10-CM | POA: Diagnosis not present

## 2023-06-01 HISTORY — PX: FLEXIBLE SIGMOIDOSCOPY: SHX5431

## 2023-06-01 HISTORY — PX: BALLOON DILATION: SHX5330

## 2023-06-01 SURGERY — SIGMOIDOSCOPY, FLEXIBLE
Anesthesia: Monitor Anesthesia Care

## 2023-06-01 MED ORDER — PROPOFOL 1000 MG/100ML IV EMUL
INTRAVENOUS | Status: AC
  Filled 2023-06-01: qty 100

## 2023-06-01 MED ORDER — LIDOCAINE HCL 1 % IJ SOLN
INTRAMUSCULAR | Status: DC | PRN
  Administered 2023-06-01: 50 mg via INTRADERMAL

## 2023-06-01 MED ORDER — PROPOFOL 500 MG/50ML IV EMUL
INTRAVENOUS | Status: DC | PRN
  Administered 2023-06-01: 130 ug/kg/min via INTRAVENOUS

## 2023-06-01 MED ORDER — PROPOFOL 10 MG/ML IV BOLUS
INTRAVENOUS | Status: DC | PRN
  Administered 2023-06-01 (×2): 20 mg via INTRAVENOUS
  Administered 2023-06-01: 10 mg via INTRAVENOUS

## 2023-06-01 MED ORDER — SODIUM CHLORIDE 0.9 % IV SOLN: INTRAVENOUS | Status: DC | PRN

## 2023-06-01 NOTE — H&P (Signed)
Evelena Asa III HPI: On a daily basis he needs to apply an enema to help relieve himself of stool.  The rate-limiting step is the anastomotic stenosis.  It was patent during the colonoscopy on 09/2022, but a pediatric colonoscope was used.  Because of this daily ritual, he is seriously considering a permanent colostomy.  However, he did recall in the past that dilation of the stenosis was attempted and there was some possibility of improvement.  Past Medical History:  Diagnosis Date   Hyperlipidemia    OSA on CPAP    Peripheral neuropathy    Hands and feet from Chemo   Rectal adenocarcinoma (HCC)    Right shoulder pain     Past Surgical History:  Procedure Laterality Date   APPLICATION OF WOUND VAC N/A 11/25/2019   Procedure: APPLICATION OF WOUND VAC AND DRAIN PLACEMENT;  Surgeon: Romie Levee, MD;  Location: [facility];  Service: General;  Laterality: N/A;   BALLOON DILATION N/A 06/03/2020   Procedure: [place];  Surgeon: Jeani Hawking, MD;  Location: [facility];  Service: Endoscopy;  Laterality: N/A;   BALLOON DILATION N/A 07/30/2020   Procedure: BALLOON DILATION;  Surgeon: Jeani Hawking, MD;  Location: [facility];  Service: Endoscopy;  Laterality: N/A;   COLONOSCOPY  02/04/2019   FLEXIBLE SIGMOIDOSCOPY N/A 04/22/2020   Procedure: FLEXIBLE SIGMOIDOSCOPY;  Surgeon: Romie Levee, MD;  Location: [facility];  Service: Endoscopy;  Laterality: N/A;   FLEXIBLE SIGMOIDOSCOPY N/A 06/03/2020   Procedure: FLEXIBLE SIGMOIDOSCOPY;  Surgeon: Jeani Hawking, MD;  Location: [facility];  Service: Endoscopy;  Laterality: N/A;   FLEXIBLE SIGMOIDOSCOPY N/A 07/30/2020   Procedure: FLEXIBLE SIGMOIDOSCOPY;  Surgeon: Jeani Hawking, MD;  Location: [facility];  Service: Endoscopy;  Laterality: N/A;   ILEOSTOMY Right 11/25/2019   Procedure: PLACEMENT OF ILEOSTOMY;  Surgeon: Romie Levee, MD;  Location: [facility];  Service: General;  Laterality: Right;   IR CATHETER TUBE CHANGE  11/11/2019   IR  PATIENT EVAL TECH 0-60 MINS  12/22/2019   KNEE ARTHROSCOPY Left 11/19/2018   LAPAROTOMY N/A 11/25/2019   Procedure: Joanne Gavel LAPAROTMY WITH OPEN PELVIC WASHOUT;  Surgeon: Romie Levee, MD;  Location: [facility];  Service: General;  Laterality: N/A;   PORT-A-CATH REMOVAL N/A 10/15/2019   Procedure: PORT REMOVAL;  Surgeon: Romie Levee, MD;  Location: [facility];  Service: General;  Laterality: N/A;   PORTACATH PLACEMENT Left 02/21/2019   Procedure: INSERTION PORT-A-CATH;  Surgeon: Luretha Murphy, MD;  Location: [facility];  Service: General;  Laterality: Left;   WISDOM TOOTH EXTRACTION     XI ROBOTIC ASSISTED LOWER ANTERIOR RESECTION N/A 10/15/2019   Procedure: XI ROBOTIC ASSISTED LOWER ANTERIOR RESECTION, RIGID PROCTOSCOPY;  Surgeon: Romie Levee, MD;  Location: [facility];  Service: General;  Laterality: N/A;   XI ROBOTIC ASSISTED LOWER ANTERIOR RESECTION N/A 11/19/2020   Procedure: XI ROBOTIC ASSISTED REDO OF LOWER ANTERIOR RESECTION WITH ROBOTIC LYSIS OF ADHESIONS;  Surgeon: Romie Levee, MD;  Location: [facility];  Service: General;  Laterality: N/A;    Family History  Problem Relation Age of Onset   Heart attack Father    Sleep apnea Father    Cancer Paternal Grandmother    Cancer Paternal Grandfather     Social History:  reports that he quit smoking about 19 years ago. His smoking use included cigarettes. He has never used smokeless tobacco. He reports current alcohol use of about 3.0 standard drinks of alcohol per week. He reports that he does not use drugs.  Allergies: No  Known Allergies  Medications: Scheduled: Continuous:  No results found for this or any previous visit (from the past 24 hours).   No results found.  ROS:  As stated above in the HPI otherwise negative.  Blood pressure (!) 150/100, pulse 72, temperature 98.2 F (36.8 C), temperature source Tympanic, resp. rate 17, height 6' (1.829 m), weight 82.6 kg, SpO2 98%.    PE: Gen: NAD, Alert and Oriented HEENT:  [place]/AT,  EOMI Neck: Supple, no LAD Lungs: CTA Bilaterally CV: RRR without M/G/R ABD: Soft, NTND, +BS Ext: No C/C/E  Assessment/Plan: 1) Anastomotic stricture - FFS with balloon dilation.  Raylon Lamson D 06/01/2023, 11:27 AM

## 2023-06-01 NOTE — Discharge Instructions (Signed)

## 2023-06-01 NOTE — Anesthesia Preprocedure Evaluation (Addendum)
Anesthesia Evaluation  Patient identified by MRN, date of birth, ID band Patient awake    Reviewed: Allergy & Precautions, NPO status , Patient's Chart, lab work & pertinent test results  History of Anesthesia Complications Negative for: history of anesthetic complications  Airway Mallampati: II  TM Distance: >3 FB Neck ROM: Full    Dental no notable dental hx.    Pulmonary sleep apnea and Continuous Positive Airway Pressure Ventilation , former smoker   Pulmonary exam normal        Cardiovascular negative cardio ROS Normal cardiovascular exam     Neuro/Psych   Anxiety Depression       GI/Hepatic Neg liver ROS,,,dilation/stenosis of anus and rectum   Endo/Other  negative endocrine ROS    Renal/GU negative Renal ROS     Musculoskeletal negative musculoskeletal ROS (+)    Abdominal   Peds  Hematology negative hematology ROS (+)   Anesthesia Other Findings Day of surgery medications reviewed with patient.  Reproductive/Obstetrics                             Anesthesia Physical Anesthesia Plan  ASA: 2  Anesthesia Plan: MAC   Post-op Pain Management: Minimal or no pain anticipated   Induction:   PONV Risk Score and Plan: 1 and Treatment may vary due to age or medical condition and Propofol infusion  Airway Management Planned: Natural Airway and Nasal Cannula  Additional Equipment: None  Intra-op Plan:   Post-operative Plan:   Informed Consent: I have reviewed the patients History and Physical, chart, labs and discussed the procedure including the risks, benefits and alternatives for the proposed anesthesia with the patient or authorized representative who has indicated his/her understanding and acceptance.       Plan Discussed with: CRNA  Anesthesia Plan Comments:        Anesthesia Quick Evaluation

## 2023-06-01 NOTE — Anesthesia Postprocedure Evaluation (Signed)
Anesthesia Post Note  Patient: Brandon Barry  Procedure(s) Performed: FLEXIBLE SIGMOIDOSCOPY BALLOON DILATION     Patient location during evaluation: PACU Anesthesia Type: MAC Level of consciousness: awake and alert Pain management: pain level controlled Vital Signs Assessment: post-procedure vital signs reviewed and stable Respiratory status: spontaneous breathing, nonlabored ventilation and respiratory function stable Cardiovascular status: blood pressure returned to baseline Postop Assessment: no apparent nausea or vomiting Anesthetic complications: no   No notable events documented.  Last Vitals:  Vitals:   06/01/23 1240 06/01/23 1255  BP: 128/87 131/87  Pulse: (!) 57 60  Resp: 18 19  Temp:    SpO2: 99% 100%    Last Pain:  Vitals:   06/01/23 1240  TempSrc:   PainSc: 0-No pain                 Shanda Howells

## 2023-06-01 NOTE — Op Note (Signed)
[facility] Patient Name: Brandon Barry Procedure Date: 06/01/2023 MRN: [id number] Attending MD: Jeani Hawking , MD, [id number] Date of Birth: 01-30-1976 CSN: [id number] Age: 47 Admit Type: Inpatient Procedure:                Flexible Sigmoidoscopy Indications:              Change in stool caliber Providers:                Jeani Hawking, MD, Stephens Shire RN, RN, Rozetta Nunnery, Technician, Salley Scarlet, Technician,                            Rhodia Albright, Technician Referring MD:              Medicines:                Propofol per Anesthesia Complications:            No immediate complications. Estimated Blood Loss:     Estimated blood loss: none. Procedure:                Pre-Anesthesia Assessment:                           - Prior to the procedure, a History and Physical                            was performed, and patient medications and                            allergies were reviewed. The patient's tolerance of                            previous anesthesia was also reviewed. The risks                            and benefits of the procedure and the sedation                            options and risks were discussed with the patient.                            All questions were answered, and informed consent                            was obtained. Prior Anticoagulants: The patient has                            taken no anticoagulant or antiplatelet agents. ASA                            Grade Assessment: II - A patient with mild systemic  disease. After reviewing the risks and benefits,                            the patient was deemed in satisfactory condition to                            undergo the procedure.                           - Sedation was administered by an anesthesia                            professional. Deep sedation was attained.                           After obtaining informed  consent, the scope was                            passed under direct vision. The [id number]-[id number] ([id number])                            Olympus endoscope was introduced through the anus                            and advanced to the the sigmoid colon. The flexible                            sigmoidoscopy was accomplished without difficulty.                            The patient tolerated the procedure well. The                            quality of the bowel preparation was good. Scope In: 12:08:03 PM Scope Out: 12:18:44 PM Total Procedure Duration: 0 hours 10 minutes 41 seconds  Findings:      A benign-appearing, intrinsic mild stenosis measuring 1 cm (in length) x       1.3 cm (inner diameter) was found in the rectum and was traversed. A TTS       dilator was passed through the scope. Dilation with a 15-16.5-18 mm       colonic balloon dilator was performed. The dilation site was examined       following endoscope reinsertion and showed moderate improvement in       luminal narrowing. Estimated blood loss: none.      A rectal examination was performed and at 7 cm from the anal verge there       was a fixed and hard stricture. This was consistent with his stricture       in the past as a result of the surgery and radiation. A diverticulum was       noted in the strictured region and there was the possibility of pus       versus mucus in that area. The diverticulum was cleared out and there       was no further evidence of any discharge. Balloon dilation was performed  using the 15-18 mm dilator. Dilation started at 15 mm, but at that       diameter there was free movement of the balloon. At 16.5 mm the balloon       was held in placed for one minute and then deflated. There was no       evidence of any mucosal breaks. Repeat dilation was performed starting       at 16.5 mm and the diameter was slowly increased in .5 ATM increments up       to 18 mm. Evidence of any mucosal breaks were  monitored through the       balloon. At 18 mm (7 ATM), the balloon was held in place for 1 minute.       Post dilation there was no evidence of any mucosal breaks. A repeat       rectal examination did reveal that the stenosis was wider. Impression:               - Stricture in the rectum. Dilated.                           - No specimens collected. Moderate Sedation:      Not Applicable - Patient had care per Anesthesia. Recommendation:           - Patient has a contact number available for                            emergencies. The signs and symptoms of potential                            delayed complications were discussed with the                            patient. Return to normal activities tomorrow.                            Written discharge instructions were provided to the                            patient.                           - Resume regular diet.                           - ? Repeat FFS with dilation in 2-3 weeks. Call the                            office in one week to update response with dilation. Procedure Code(s):        --- Professional ---                           [phone], Sigmoidoscopy, flexible; with                            transendoscopic balloon dilation Diagnosis Code(s):        --- Professional ---  K62.4, Stenosis of anus and rectum                           R19.5, Other fecal abnormalities CPT copyright 2022 [employer]. All rights reserved. The codes documented in this report are preliminary and upon coder review may  be revised to meet current compliance requirements. Jeani Hawking, MD Jeani Hawking, MD 06/01/2023 12:30:29 PM This report has been signed electronically. Number of Addenda: 0

## 2023-06-01 NOTE — Transfer of Care (Signed)
Immediate Anesthesia Transfer of Care Note  Patient: Brandon Barry  Procedure(s) Performed: FLEXIBLE SIGMOIDOSCOPY BALLOON DILATION  Patient Location: PACU  Anesthesia Type:MAC  Level of Consciousness: sedated  Airway & Oxygen Therapy: Patient Spontanous Breathing and Patient connected to face mask oxygen  Post-op Assessment: Report given to RN and Post -op Vital signs reviewed and stable  Post vital signs: Reviewed and stable  Last Vitals:  Vitals Value Taken Time  BP    Temp    Pulse 59 06/01/23 1223  Resp 21 06/01/23 1223  SpO2 100 % 06/01/23 1223  Vitals shown include unfiled device data.  Last Pain:  Vitals:   05/25/23 1201  TempSrc: Tympanic  PainSc: 0-No pain         Complications: No notable events documented.

## 2023-06-02 ENCOUNTER — Encounter (HOSPITAL_COMMUNITY): Payer: Self-pay | Admitting: Gastroenterology

## 2023-06-04 ENCOUNTER — Encounter: Payer: Self-pay | Admitting: Gastroenterology

## 2023-10-22 NOTE — Progress Notes (Deleted)
 Brandon Barry

## 2023-10-23 ENCOUNTER — Telehealth: Payer: Commercial Managed Care - PPO | Admitting: Adult Health

## 2023-11-06 ENCOUNTER — Inpatient Hospital Stay (HOSPITAL_BASED_OUTPATIENT_CLINIC_OR_DEPARTMENT_OTHER): Payer: Commercial Managed Care - PPO | Admitting: Oncology

## 2023-11-06 ENCOUNTER — Inpatient Hospital Stay: Payer: Commercial Managed Care - PPO | Attending: Oncology

## 2023-11-06 VITALS — BP 136/90 | HR 70 | Temp 97.8°F | Resp 16 | Ht 72.0 in | Wt 176.9 lb

## 2023-11-06 DIAGNOSIS — Z9221 Personal history of antineoplastic chemotherapy: Secondary | ICD-10-CM | POA: Diagnosis not present

## 2023-11-06 DIAGNOSIS — K624 Stenosis of anus and rectum: Secondary | ICD-10-CM | POA: Diagnosis not present

## 2023-11-06 DIAGNOSIS — C19 Malignant neoplasm of rectosigmoid junction: Secondary | ICD-10-CM | POA: Diagnosis present

## 2023-11-06 DIAGNOSIS — C20 Malignant neoplasm of rectum: Secondary | ICD-10-CM

## 2023-11-06 DIAGNOSIS — K439 Ventral hernia without obstruction or gangrene: Secondary | ICD-10-CM | POA: Diagnosis not present

## 2023-11-06 DIAGNOSIS — G620 Drug-induced polyneuropathy: Secondary | ICD-10-CM | POA: Diagnosis not present

## 2023-11-06 DIAGNOSIS — K627 Radiation proctitis: Secondary | ICD-10-CM | POA: Insufficient documentation

## 2023-11-06 DIAGNOSIS — Z923 Personal history of irradiation: Secondary | ICD-10-CM | POA: Insufficient documentation

## 2023-11-06 DIAGNOSIS — G893 Neoplasm related pain (acute) (chronic): Secondary | ICD-10-CM | POA: Insufficient documentation

## 2023-11-06 LAB — CEA (ACCESS): CEA (CHCC): 1.5 ng/mL (ref 0.00–5.00)

## 2023-11-06 NOTE — Progress Notes (Signed)
 [facility] OFFICE PROGRESS NOTE   Diagnosis: Rectal cancer  INTERVAL HISTORY:   Brandon Barry returns as scheduled.  He generally feels well.  Good appetite.  He is working.  Reports minimal numbness in his feet after prolonged standing.  He has irregular bowel habits.  He uses enemas in order to have a bowel movement.  He has seen multiple surgeons to discuss management of the rectal stricture and ventral hernia.  He is considering a colostomy and repair of the hernia. He underwent drainage of a recurrent right hydrocele at [facility] on 10/26/2023.  Objective:  Vital signs in last 24 hours:  Blood pressure (!) 136/90, pulse 70, temperature 97.8 F (36.6 C), temperature source Oral, resp. rate 16, height 6' (1.829 m), weight 176 lb 14.4 oz (80.2 kg), SpO2 100%.     Lymphatics: No cervical, supraclavicular, axillary, or inguinal nodes Resp: Lungs clear bilaterally Cardio: Regular rate and rhythm GI: No hepatosplenomegaly, large ventral hernia, nontender, no mass Vascular: No leg edema   Lab Results:  Lab Results  Component Value Date   WBC 6.5 11/28/2022   HGB 13.8 11/28/2022   HCT 40.8 11/28/2022   MCV 89.1 11/28/2022   PLT 274 11/28/2022   NEUTROABS 10.2 (H) 11/23/2020    CMP  Lab Results  Component Value Date   NA 139 11/28/2022   K 3.6 11/28/2022   CL 109 11/28/2022   CO2 24 11/28/2022   GLUCOSE 99 11/28/2022   BUN 12 11/28/2022   CREATININE 0.94 11/28/2022   CALCIUM  8.1 (L) 11/28/2022   PROT 7.2 11/27/2022   ALBUMIN 4.2 11/27/2022   AST 25 11/27/2022   ALT 27 11/27/2022   ALKPHOS 67 11/27/2022   BILITOT 1.0 11/27/2022   GFRNONAA >60 11/28/2022   GFRAA >60 11/30/2019    Lab Results  Component Value Date   CEA1 1.91 02/22/2021   CEA 1.50 11/06/2023   Medications: I have reviewed the patient's current medications.   Assessment/Plan:  Rectosigmoid cancer  Colonoscopy by Dr. Nickey Barn 02/04/2019-fungating infiltrative and ulcerated nonobstructing  large mass in the rectum.  The mass was circumferential measuring 3 cm in length.  There was no bleeding present.  The mass was 10 cm from the anal verge.  Biopsy showed invasive adenocarcinoma, moderately differentiated; MLH1, MSH2, MSH6 and PMS2 intact.   CTs abdomen and pelvis 02/04/2019-apparent colorectal mass measuring 3.9 x 5.4 x 3.2 cm involving the distal sigmoid colon and proximal rectum with adjacent borderline enlarged suspicious mesorectal lymph nodes; indeterminate lesion in segment 6 of the liver measuring 1.4 x 1.1 cm. MRI pelvis 02/13/2019- T3c, N1 tumor at 11.5 cm from the anal verge, 6.7 cm from the internal anal sphincter, 3 lymph nodes greater than 5 mm, largest 8 mm CT chest 02/13/2019-negative for metastatic disease MRI liver 02/13/2019- 3.6 cm hemangioma in the posterior right hepatic lobe, 10 mm indeterminate lesion in the posterior right hepatic lobe-potentially an atypical hemangioma- solitary metastasis not excluded Cycle 1 FOLFOX 02/26/2019 Cycle 2 FOLFOX 03/13/2019, Emend  added Cycle 3 FOLFOX 03/27/2019 Cycle 4 FOLFOX 04/10/2019 Cycle 5 FOLFOX 04/24/2019 Cycle 6 FOLFOX 05/07/2019  Cycle 7 FOLFOX 05/22/2019 (5-fluorouracil  dose reduced) Cycle 8 FOLFOX 06/09/2019 Capecitabine /radiation 06/30/2019-07/06/2019 Capecitabine  placed on hold with the p.m. dose on 07/30/2019, 1000 mg twice daily Capecitabine  resumed with the p.m. dose on 08/04/2019 with a dose reduction CT liver 08/11/2019-stable subcapsular hemangioma right hepatic lobe.  Stable small indeterminate right hepatic lobe lesion unchanged in size from CT 02/04/2019. 10/15/2019, robotic assisted low  anterior resection/Port-A-Cath removal, adenocarcinoma of distal sigmoid/proximal rectum invading pericolonic connective tissue, negative resection margins, 0/24 nodes,ypT3ypN0, treatment effect present-tumor associated fibrosis  CT abdomen/pelvis 10/31/2019-anastomotic breakdown at the rectum, extraluminal stool, right lower quadrant  abscess, small bowel obstruction, air within the bladder lumen, stable subcentimeter right hepatic nodule CT abdomen/pelvis 11/15/2019-stable 1 cm segment 7 lesion, no new liver lesions, anastomotic breakdown at the rectum with decreased size of complex surrounding gas/fluid collection in good position of transgluteal drain, persistent small bowel obstruction CT pelvis 02/27/2020-no measurable perianastomotic fluid collection or free air, no evidence of metastatic disease CTs 08/19/2020-stable 2 mm left upper lobe pulmonary nodule.  No other suspicious appearing pulmonary nodules or masses noted.  Cavernous hemangiomas in the liver, similar to the prior examinations.  Postoperative changes with what appears to be some postoperative scarring in the presacral space adjacent to the suture line at the rectosigmoid anastomosis. Redo low anterior resection 11/19/2020 for management of a stricture Evaluation by Dr. Vida Gravely at [facility] 01/10/2021-posterior anastomotic defect on exam at 7 cm, sigmoidoscopy confirmed a 2 cm wide anastomotic defect in the posterior midline CTs 02/22/2021-treatment related changes in the pelvis, reduction in conspicuity of perirectal fat planes, stable right hepatic lobe hemangiomas, stable pulmonary nodules, right abdominal ileostomy MRI pelvis 05/03/2021-postsurgical changes of prior low anterior resection without definite evidence of recurrent or metastatic disease in the pelvis.  Decrease in size of a loculated fluid collection in the presacral space, bordered posteriorly and laterally by T2 hypointense soft tissue representing likely chronic scarring/fibrosis which appears increased. CTs 08/12/2021-compared with 05/03/2021 MRI of the pelvis there is overall similar soft tissue in the presacral space surrounding the colorectal anastomosis at the site of prior contained perforation.  Unchanged hepatic segment 7 enhancing lesion previously characterized as hemangioma.  Unchanged subcentimeter  hypodensity hepatic segment 6.  Unchanged 0.3 cm right lower lobe lung nodule.  No new or enlarging nodules. Ileostomy takedown 01/17/2022 CT abdomen/pelvis 01/22/2022-dilated loops of small bowel with prominent wall thickening of ileal loops with tapering toward the anastomosis in the right mid abdomen, prominent mesenteric nodes, likely reactive, bilateral pleural effusions with atelectasis right greater than left CT Abdo/pelvis 05/08/2022-air and fluid extending from the posterior rectal anastomosis to the presacral space suspicious for fistulous communication, focal area of air/fluid in the presacral space MRI pelvis 05/24/2022-unchanged small tract extending from the surgical anastomosis to the presacral space collection, extensive presacral fibrotic changes with small collection containing gas and fluid 10/10/2022: Colonoscopy-evidence of a fistula, no polyps CT abdomen/pelvis 11/27/2022-small bowel obstruction at the ileum just beyond the Antero enteric anastomosis, presacral scarring with a few internal gas bubbles, no drainable collection 06/01/2023: Flexible sigmoidoscopy-not appearing rectal stenosis, dilated, diverticulum in the strictured region Change in bowel habits/rectal pain secondary to #1, improved Port-A-Cath placement, Dr. Gaylyn Keas, 02/21/2019 Early oxaliplatin  neuropathy-trial of Neurontin  initiated 08/25/2019 due to foot pain, improved Pain secondary to hand/foot syndrome, radiation skin toxicity, and radiation proctitis-improved Admission with intra-abdominal abscess/anastomotic dehiscence, surgery 11/25/2019- exploratory laparotomy, placement of wound VAC and drain, diverting loop ileostomy     Disposition: Mr. Cronkright is in clinical remission from rectal cancer.  He will return for an office visit and CEA in 6 months.  He will continue follow-up with surgeons at [facility] and [facility] for management of the rectal stricture and ventral hernia.  He will continue colonoscopy surveillance with Dr.  Nickey Barn.  Coni Deep, MD  11/06/2023  11:25 AM

## 2024-05-07 ENCOUNTER — Other Ambulatory Visit

## 2024-05-07 ENCOUNTER — Ambulatory Visit: Admitting: Oncology

## 2024-05-21 ENCOUNTER — Inpatient Hospital Stay (HOSPITAL_BASED_OUTPATIENT_CLINIC_OR_DEPARTMENT_OTHER): Admitting: Oncology

## 2024-05-21 ENCOUNTER — Inpatient Hospital Stay: Attending: Oncology

## 2024-05-21 VITALS — BP 117/85 | HR 71 | Temp 98.0°F | Resp 18 | Ht 72.0 in | Wt 179.6 lb

## 2024-05-21 DIAGNOSIS — C20 Malignant neoplasm of rectum: Secondary | ICD-10-CM

## 2024-05-21 DIAGNOSIS — K624 Stenosis of anus and rectum: Secondary | ICD-10-CM | POA: Diagnosis not present

## 2024-05-21 DIAGNOSIS — Z85038 Personal history of other malignant neoplasm of large intestine: Secondary | ICD-10-CM | POA: Diagnosis present

## 2024-05-21 DIAGNOSIS — Z923 Personal history of irradiation: Secondary | ICD-10-CM | POA: Insufficient documentation

## 2024-05-21 DIAGNOSIS — Z9221 Personal history of antineoplastic chemotherapy: Secondary | ICD-10-CM | POA: Insufficient documentation

## 2024-05-21 DIAGNOSIS — K439 Ventral hernia without obstruction or gangrene: Secondary | ICD-10-CM | POA: Insufficient documentation

## 2024-05-21 LAB — CEA (ACCESS): CEA (CHCC): 1.55 ng/mL (ref 0.00–5.00)

## 2024-05-21 NOTE — Progress Notes (Signed)
 [facility] OFFICE PROGRESS NOTE   Diagnosis: Rectal cancer  INTERVAL HISTORY:   Brandon Barry returns as scheduled.  He feels well.  Good appetite and energy level.  He has mild remaining neuropathy symptoms in the feet.  He continues to have difficulty with bowel movements secondary to the rectal stricture.  He is now followed in surgery at [facility] for the rectal stricture and abdominal hernia.  He is considering having surgery for both.    Objective:  Vital signs in last 24 hours:  Blood pressure 117/85, pulse 71, temperature 98 F (36.7 C), resp. rate 18, height 6' (1.829 m), weight 179 lb 9.6 oz (81.5 kg), SpO2 99%.    Lymphatics: No cervical, supraclavicular, axillary, or inguinal nodes Resp: Lungs clear bilaterally Cardio: Regular rate and rhythm GI: No hepatosplenomegaly, nontender, no mass, ventral hernia Vascular: No leg edema   Lab Results:  Lab Results  Component Value Date   WBC 6.5 11/28/2022   HGB 13.8 11/28/2022   HCT 40.8 11/28/2022   MCV 89.1 11/28/2022   PLT 274 11/28/2022   NEUTROABS 10.2 (H) 11/23/2020    CMP  Lab Results  Component Value Date   NA 139 11/28/2022   K 3.6 11/28/2022   CL 109 11/28/2022   CO2 24 11/28/2022   GLUCOSE 99 11/28/2022   BUN 12 11/28/2022   CREATININE 0.94 11/28/2022   CALCIUM  8.1 (L) 11/28/2022   PROT 7.2 11/27/2022   ALBUMIN 4.2 11/27/2022   AST 25 11/27/2022   ALT 27 11/27/2022   ALKPHOS 67 11/27/2022   BILITOT 1.0 11/27/2022   GFRNONAA >60 11/28/2022   GFRAA >60 11/30/2019    Lab Results  Component Value Date   CEA1 1.91 02/22/2021   CEA 1.50 11/06/2023    Lab Results  Component Value Date   INR 1.2 11/01/2019   LABPROT 14.9 11/01/2019    Imaging:  No results found.  Medications: I have reviewed the patient's current medications.   Assessment/Plan: Rectosigmoid cancer  Colonoscopy by Dr. Rollin 02/04/2019-fungating infiltrative and ulcerated nonobstructing large mass in the rectum.   The mass was circumferential measuring 3 cm in length.  There was no bleeding present.  The mass was 10 cm from the anal verge.  Biopsy showed invasive adenocarcinoma, moderately differentiated; MLH1, MSH2, MSH6 and PMS2 intact.   CTs abdomen and pelvis 02/04/2019-apparent colorectal mass measuring 3.9 x 5.4 x 3.2 cm involving the distal sigmoid colon and proximal rectum with adjacent borderline enlarged suspicious mesorectal lymph nodes; indeterminate lesion in segment 6 of the liver measuring 1.4 x 1.1 cm. MRI pelvis 02/13/2019- T3c, N1 tumor at 11.5 cm from the anal verge, 6.7 cm from the internal anal sphincter, 3 lymph nodes greater than 5 mm, largest 8 mm CT chest 02/13/2019-negative for metastatic disease MRI liver 02/13/2019- 3.6 cm hemangioma in the posterior right hepatic lobe, 10 mm indeterminate lesion in the posterior right hepatic lobe-potentially an atypical hemangioma- solitary metastasis not excluded Cycle 1 FOLFOX 02/26/2019 Cycle 2 FOLFOX 03/13/2019, Emend  added Cycle 3 FOLFOX 03/27/2019 Cycle 4 FOLFOX 04/10/2019 Cycle 5 FOLFOX 04/24/2019 Cycle 6 FOLFOX 05/07/2019  Cycle 7 FOLFOX 05/22/2019 (5-fluorouracil  dose reduced) Cycle 8 FOLFOX 06/09/2019 Capecitabine /radiation 06/30/2019-07/06/2019 Capecitabine  placed on hold with the p.m. dose on 07/30/2019, 1000 mg twice daily Capecitabine  resumed with the p.m. dose on 08/04/2019 with a dose reduction CT liver 08/11/2019-stable subcapsular hemangioma right hepatic lobe.  Stable small indeterminate right hepatic lobe lesion unchanged in size from CT 02/04/2019. 10/15/2019, robotic assisted  low anterior resection/Port-A-Cath removal, adenocarcinoma of distal sigmoid/proximal rectum invading pericolonic connective tissue, negative resection margins, 0/24 nodes,ypT3ypN0, treatment effect present-tumor associated fibrosis  CT abdomen/pelvis 10/31/2019-anastomotic breakdown at the rectum, extraluminal stool, right lower quadrant abscess, small bowel  obstruction, air within the bladder lumen, stable subcentimeter right hepatic nodule CT abdomen/pelvis 11/15/2019-stable 1 cm segment 7 lesion, no new liver lesions, anastomotic breakdown at the rectum with decreased size of complex surrounding gas/fluid collection in good position of transgluteal drain, persistent small bowel obstruction CT pelvis 02/27/2020-no measurable perianastomotic fluid collection or free air, no evidence of metastatic disease CTs 08/19/2020-stable 2 mm left upper lobe pulmonary nodule.  No other suspicious appearing pulmonary nodules or masses noted.  Cavernous hemangiomas in the liver, similar to the prior examinations.  Postoperative changes with what appears to be some postoperative scarring in the presacral space adjacent to the suture line at the rectosigmoid anastomosis. Redo low anterior resection 11/19/2020 for management of a stricture Evaluation by Dr. Zoila at [facility] 01/10/2021-posterior anastomotic defect on exam at 7 cm, sigmoidoscopy confirmed a 2 cm wide anastomotic defect in the posterior midline CTs 02/22/2021-treatment related changes in the pelvis, reduction in conspicuity of perirectal fat planes, stable right hepatic lobe hemangiomas, stable pulmonary nodules, right abdominal ileostomy MRI pelvis 05/03/2021-postsurgical changes of prior low anterior resection without definite evidence of recurrent or metastatic disease in the pelvis.  Decrease in size of a loculated fluid collection in the presacral space, bordered posteriorly and laterally by T2 hypointense soft tissue representing likely chronic scarring/fibrosis which appears increased. CTs 08/12/2021-compared with 05/03/2021 MRI of the pelvis there is overall similar soft tissue in the presacral space surrounding the colorectal anastomosis at the site of prior contained perforation.  Unchanged hepatic segment 7 enhancing lesion previously characterized as hemangioma.  Unchanged subcentimeter hypodensity hepatic segment  6.  Unchanged 0.3 cm right lower lobe lung nodule.  No new or enlarging nodules. Ileostomy takedown 01/17/2022 CT abdomen/pelvis 01/22/2022-dilated loops of small bowel with prominent wall thickening of ileal loops with tapering toward the anastomosis in the right mid abdomen, prominent mesenteric nodes, likely reactive, bilateral pleural effusions with atelectasis right greater than left CT Abdo/pelvis 05/08/2022-air and fluid extending from the posterior rectal anastomosis to the presacral space suspicious for fistulous communication, focal area of air/fluid in the presacral space MRI pelvis 05/24/2022-unchanged small tract extending from the surgical anastomosis to the presacral space collection, extensive presacral fibrotic changes with small collection containing gas and fluid 10/10/2022: Colonoscopy-evidence of a fistula, no polyps CT abdomen/pelvis 11/27/2022-small bowel obstruction at the ileum just beyond the Antero enteric anastomosis, presacral scarring with a few internal gas bubbles, no drainable collection 06/01/2023: Flexible sigmoidoscopy-benign appearing rectal stenosis, dilated, diverticulum in the strictured region Change in bowel habits/rectal pain secondary to #1, improved Port-A-Cath placement, Dr. Gladis, 02/21/2019 Early oxaliplatin  neuropathy-trial of Neurontin  initiated 08/25/2019 due to foot pain, improved Pain secondary to hand/foot syndrome, radiation skin toxicity, and radiation proctitis-improved Admission with intra-abdominal abscess/anastomotic dehiscence, surgery 11/25/2019- exploratory laparotomy, placement of wound VAC and drain, diverting loop ileostomy     Disposition: Brandon Barry remains in clinical remission from rectal cancer.  He is now greater than 5 years out from diagnosis.  He continues to have symptoms related to a rectal stricture and ventral hernia.  He is now followed by the surgical service at [facility].  Brandon Barry will continue colonoscopy surveillance with Dr.  Rollin.  He was discharged in the medical oncology clinic today.  I am available to see him as  needed.  Arley Hof, MD  05/21/2024  10:30 AM
